# Patient Record
Sex: Female | Born: 1945
Health system: Southern US, Community
[De-identification: ages and names within clinical notes are randomized; demographics above are authoritative.]

## PROBLEM LIST (undated history)

## (undated) DIAGNOSIS — G629 Polyneuropathy, unspecified: Secondary | ICD-10-CM

## (undated) DIAGNOSIS — M2540 Effusion, unspecified joint: Secondary | ICD-10-CM

## (undated) DIAGNOSIS — M1711 Unilateral primary osteoarthritis, right knee: Secondary | ICD-10-CM

## (undated) DIAGNOSIS — Z860100 Personal history of colon polyps, unspecified: Secondary | ICD-10-CM

## (undated) DIAGNOSIS — Z803 Family history of malignant neoplasm of breast: Secondary | ICD-10-CM

## (undated) DIAGNOSIS — Z8619 Personal history of other infectious and parasitic diseases: Secondary | ICD-10-CM

## (undated) DIAGNOSIS — E785 Hyperlipidemia, unspecified: Secondary | ICD-10-CM

## (undated) DIAGNOSIS — F419 Anxiety disorder, unspecified: Secondary | ICD-10-CM

## (undated) DIAGNOSIS — K219 Gastro-esophageal reflux disease without esophagitis: Secondary | ICD-10-CM

## (undated) DIAGNOSIS — I10 Essential (primary) hypertension: Secondary | ICD-10-CM

## (undated) DIAGNOSIS — M2550 Pain in unspecified joint: Secondary | ICD-10-CM

## (undated) DIAGNOSIS — T7840XA Allergy, unspecified, initial encounter: Secondary | ICD-10-CM

## (undated) DIAGNOSIS — G4730 Sleep apnea, unspecified: Secondary | ICD-10-CM

## (undated) DIAGNOSIS — J189 Pneumonia, unspecified organism: Secondary | ICD-10-CM

## (undated) DIAGNOSIS — Z800 Family history of malignant neoplasm of digestive organs: Secondary | ICD-10-CM

## (undated) DIAGNOSIS — R7303 Prediabetes: Secondary | ICD-10-CM

## (undated) DIAGNOSIS — F329 Major depressive disorder, single episode, unspecified: Secondary | ICD-10-CM

## (undated) DIAGNOSIS — Z87442 Personal history of urinary calculi: Secondary | ICD-10-CM

## (undated) DIAGNOSIS — F32A Depression, unspecified: Secondary | ICD-10-CM

## (undated) HISTORY — PX: APPENDECTOMY: SHX54

## (undated) HISTORY — DX: Gastro-esophageal reflux disease without esophagitis: K21.9

## (undated) HISTORY — PX: ELBOW ARTHROSCOPY: SUR87

## (undated) HISTORY — PX: OTHER SURGICAL HISTORY: SHX169

## (undated) HISTORY — DX: Family history of malignant neoplasm of breast: Z80.3

## (undated) HISTORY — PX: COLONOSCOPY: SHX174

## (undated) HISTORY — DX: Family history of malignant neoplasm of digestive organs: Z80.0

## (undated) HISTORY — DX: Depression, unspecified: F32.A

## (undated) HISTORY — DX: Allergy, unspecified, initial encounter: T78.40XA

## (undated) HISTORY — DX: Hyperlipidemia, unspecified: E78.5

## (undated) HISTORY — DX: Anxiety disorder, unspecified: F41.9

## (undated) HISTORY — PX: EYE SURGERY: SHX253

## (undated) HISTORY — PX: JOINT REPLACEMENT: SHX530

## (undated) HISTORY — DX: Essential (primary) hypertension: I10

## (undated) HISTORY — DX: Major depressive disorder, single episode, unspecified: F32.9

---

## 1974-04-19 HISTORY — PX: DIAGNOSTIC LAPAROSCOPY: SUR761

## 1974-04-19 HISTORY — PX: PAROTID GLAND TUMOR EXCISION: SHX5221

## 1987-04-20 HISTORY — PX: HYSTEROTOMY: SHX1776

## 1987-04-20 HISTORY — PX: ABDOMINAL HYSTERECTOMY: SHX81

## 2002-04-19 HISTORY — PX: CERVICAL SPINE SURGERY: SHX589

## 2002-12-27 ENCOUNTER — Encounter: Payer: Self-pay | Admitting: Neurosurgery

## 2002-12-27 ENCOUNTER — Inpatient Hospital Stay (HOSPITAL_COMMUNITY): Admission: RE | Admit: 2002-12-27 | Discharge: 2002-12-28 | Payer: Self-pay | Admitting: Neurosurgery

## 2004-02-06 ENCOUNTER — Encounter: Admission: RE | Admit: 2004-02-06 | Discharge: 2004-02-06 | Payer: Self-pay | Admitting: Internal Medicine

## 2004-04-19 HISTORY — PX: SHOULDER ARTHROSCOPY W/ ROTATOR CUFF REPAIR: SHX2400

## 2004-04-19 HISTORY — PX: SHOULDER ARTHROSCOPY DISTAL CLAVICLE EXCISION AND OPEN ROTATOR CUFF REPAIR: SHX2396

## 2005-02-24 ENCOUNTER — Encounter: Admission: RE | Admit: 2005-02-24 | Discharge: 2005-02-24 | Payer: Self-pay | Admitting: Internal Medicine

## 2006-03-31 ENCOUNTER — Encounter: Admission: RE | Admit: 2006-03-31 | Discharge: 2006-03-31 | Payer: Self-pay | Admitting: Internal Medicine

## 2006-04-13 ENCOUNTER — Encounter: Admission: RE | Admit: 2006-04-13 | Discharge: 2006-04-13 | Payer: Self-pay | Admitting: Internal Medicine

## 2006-04-18 ENCOUNTER — Encounter (INDEPENDENT_AMBULATORY_CARE_PROVIDER_SITE_OTHER): Payer: Self-pay | Admitting: Specialist

## 2006-04-18 ENCOUNTER — Encounter: Admission: RE | Admit: 2006-04-18 | Discharge: 2006-04-18 | Payer: Self-pay | Admitting: Internal Medicine

## 2006-04-19 HISTORY — PX: BREAST EXCISIONAL BIOPSY: SUR124

## 2006-06-22 ENCOUNTER — Ambulatory Visit: Payer: Self-pay | Admitting: Internal Medicine

## 2006-07-11 ENCOUNTER — Ambulatory Visit: Payer: Self-pay | Admitting: Internal Medicine

## 2006-07-11 LAB — CONVERTED CEMR LAB
ALT: 17 units/L (ref 0–40)
AST: 19 units/L (ref 0–37)
Albumin: 3.9 g/dL (ref 3.5–5.2)
Alkaline Phosphatase: 72 units/L (ref 39–117)
BUN: 17 mg/dL (ref 6–23)
Basophils Absolute: 0 10*3/uL (ref 0.0–0.1)
Basophils Relative: 1.3 % — ABNORMAL HIGH (ref 0.0–1.0)
Bilirubin, Direct: 0.1 mg/dL (ref 0.0–0.3)
CO2: 33 meq/L — ABNORMAL HIGH (ref 19–32)
Calcium: 9.4 mg/dL (ref 8.4–10.5)
Chloride: 106 meq/L (ref 96–112)
Creatinine, Ser: 0.7 mg/dL (ref 0.4–1.2)
Eosinophils Absolute: 0.1 10*3/uL (ref 0.0–0.6)
Eosinophils Relative: 2.9 % (ref 0.0–5.0)
GFR calc Af Amer: 111 mL/min
GFR calc non Af Amer: 91 mL/min
Glucose, Bld: 104 mg/dL — ABNORMAL HIGH (ref 70–99)
HCT: 39.7 % (ref 36.0–46.0)
Hemoglobin: 13.7 g/dL (ref 12.0–15.0)
Hgb A1c MFr Bld: 5.9 % (ref 4.6–6.0)
Lymphocytes Relative: 27.1 % (ref 12.0–46.0)
MCHC: 34.6 g/dL (ref 30.0–36.0)
MCV: 77.4 fL — ABNORMAL LOW (ref 78.0–100.0)
Monocytes Absolute: 0.2 10*3/uL (ref 0.2–0.7)
Monocytes Relative: 6.1 % (ref 3.0–11.0)
Neutro Abs: 2.3 10*3/uL (ref 1.4–7.7)
Neutrophils Relative %: 62.6 % (ref 43.0–77.0)
Platelets: 204 10*3/uL (ref 150–400)
Potassium: 4.2 meq/L (ref 3.5–5.1)
RBC: 5.12 M/uL — ABNORMAL HIGH (ref 3.87–5.11)
RDW: 12.3 % (ref 11.5–14.6)
Sodium: 145 meq/L (ref 135–145)
TSH: 2.19 microintl units/mL (ref 0.35–5.50)
Total Bilirubin: 0.5 mg/dL (ref 0.3–1.2)
Total Protein: 6.1 g/dL (ref 6.0–8.3)
WBC: 3.5 10*3/uL — ABNORMAL LOW (ref 4.5–10.5)

## 2006-07-25 ENCOUNTER — Ambulatory Visit: Payer: Self-pay | Admitting: Internal Medicine

## 2006-10-10 ENCOUNTER — Ambulatory Visit: Payer: Self-pay | Admitting: Internal Medicine

## 2006-10-11 ENCOUNTER — Encounter (INDEPENDENT_AMBULATORY_CARE_PROVIDER_SITE_OTHER): Payer: Self-pay | Admitting: *Deleted

## 2006-10-11 LAB — CONVERTED CEMR LAB
ALT: 19 units/L (ref 0–40)
AST: 18 units/L (ref 0–37)
Cholesterol: 201 mg/dL (ref 0–200)
Direct LDL: 115.2 mg/dL
HDL: 59.8 mg/dL (ref >39.0)
Total CHOL/HDL Ratio: 3.4
Triglycerides: 88 mg/dL (ref 0–149)
VLDL: 18 mg/dL (ref 0–40)

## 2006-12-08 ENCOUNTER — Ambulatory Visit: Payer: Self-pay | Admitting: Internal Medicine

## 2007-10-23 ENCOUNTER — Encounter: Admission: RE | Admit: 2007-10-23 | Discharge: 2007-10-23 | Payer: Self-pay | Admitting: Emergency Medicine

## 2007-10-24 ENCOUNTER — Ambulatory Visit: Payer: Self-pay

## 2008-04-19 HISTORY — PX: OTHER SURGICAL HISTORY: SHX169

## 2009-03-10 ENCOUNTER — Ambulatory Visit (HOSPITAL_BASED_OUTPATIENT_CLINIC_OR_DEPARTMENT_OTHER): Admission: RE | Admit: 2009-03-10 | Discharge: 2009-03-10 | Payer: Self-pay | Admitting: Orthopedic Surgery

## 2010-05-10 ENCOUNTER — Encounter: Payer: Self-pay | Admitting: Internal Medicine

## 2010-07-22 LAB — POCT HEMOGLOBIN-HEMACUE: Hemoglobin: 13 g/dL (ref 12.0–15.0)

## 2010-09-04 NOTE — Op Note (Signed)
NAME:  Monica Kane, Monica Kane                         ACCOUNT NO.:  [id number]   MEDICAL RECORD NO.:  [id number]                   PATIENT TYPE:  INP   LOCATION:  2899                                 FACILITY:  [facility]   PHYSICIAN:  Hewitt Shorts, M.D.            DATE OF BIRTH:  29-Oct-1945   DATE OF PROCEDURE:  12/27/2002  DATE OF DISCHARGE:                                 OPERATIVE REPORT   PREOPERATIVE DIAGNOSIS:  C4-5 and C5-7 cervical disk  herniation,  spondylosis and degenerative disk disease.   POSTOPERATIVE DIAGNOSIS:  C4-5 and C5-7 cervical disk  herniation,  spondylosis and degenerative disk disease.   PROCEDURE:  C4-5 and C5-6 anterior cervical diskectomy and arthrodesis with  iliac crest allograft and Trinica cervical plating.   SURGEON:  Hewitt Shorts, M.D.   ASSISTANT:  Cristi Loron, M.D.   ANESTHESIA:  General endotracheal anesthesia.   INDICATIONS FOR PROCEDURE:  This is a 65 year old woman who presented with  an acute left cervical radiculopathy and was found to have a cervical disk  herniation, spondylosis and degenerative disk disease, C4-5 and C5-6. The  decision was made to proceed with a 2 level anterior cervical diskectomy and  arthrodesis.   DESCRIPTION OF PROCEDURE:  The patient was brought to the operating room and  placed under general endotracheal anesthesia. The patient was placed in 10  pounds of halter traction and the neck  was prepared with Betadine soap and  solution and was draped in a sterile fashion.   A horizontal incision was made on the left side of the neck. The  longitudinal incision was infiltrated with local anesthesia with  epinephrine. An incision was made and was carried down through the  subcutaneous tissue. Bipolar cautery and electrocautery were used to  maintain hemostasis. Dissection was carried down through the platysma and  then through an avascular plane, moving the sternocleidomastoid, carotid  artery and  jugular vein laterally and the trachea and esophagus medially.   The ventral aspect of the vertebral column was identified and localizing x-  rays were taken and the C4-5 and C5-6 disk spaces were identified.  Diskectomy was begun at each level  with incision of the annulus and  continued with microcurets and pituitary rongeurs. The cauterized endplates  were removed using microcurets and the Micromax drill. Then the microscope  was draped and brought into the field to provide additional magnification,  illumination and visualization, and the remainder of the diskectomy and  decompression was performed using microdissection and microsurgical  technique.   The posterior osteophytic overgrowth was removed using the Micromax drill  and the 2-mm Kerrison punches with a thin footplate. The posterior  longitudinal ligament  which was thickened at each level was carefully  removed, and we were able to remove the posterior osteophytes and thickened  ligament and decompress the spinal canal and  thecal sac.   We then examined the  foramina bilaterally at each level  and these were  decompressed. We did encounter soft disk herniation on the left side, which  I suspect corresponded to her acute radiculopathy. This was removed and good  decompression of the nerve roots and foramina was achieved bilaterally. The  wound was irrigated with Bacitracin solution. Hemostasis was established  with the use of Gelfoam soaked in thrombin.   Once decompression was completed and hemostasis was established, we  proceeded with an arthrodesis. We selected wedges of iliac crest allograft.  Each intravertebral disk space was sized  to a 7-mm height, and therefore we  selected 7-mm grafts that  were hydrated in saline solution and positioned  in the intravertebral disk space and countersunk. We then selected a 44-mm  Trinica cervical plate which was positioned  over  the fusion construct and  secured to each of  the  vertebra with a pair of  4.2 x 14 mm screws. Each  screw hole was drilled  and tapped and the screws placed in an alternating  fashion. All 6 screws were  placed fully tightened and then the locking  system secured. The wound was irrigated with Bacitracin solution.   Once hemostasis  was established and confirmed, we obtained an x-ray which  showed the plate, screws and grafts in good position. The overall alignment  was good. Then we proceeded with closure once hemostasis was confirmed.   The platysma was closed with interrupted, inverted 2-0 Vicryl running  sutures. The subcutaneous and subcuticular layers were closed with  interrupted inverted 3-0 running Vicryl sutures and the skin edges were  reapproximated with Dermabond.   The patient tolerated the procedure well. The estimated blood loss was less  than 25 mL. Sponge and instrument counts were correct. Following  the  surgery the patient was to be reversed from anesthetic, extubated and  transferred to the recovery room for further care. She was placed in a soft  cervical collar.                                               Hewitt Shorts, M.D.    RWN/MEDQ  D:  12/27/2002  T:  12/28/2002  Job:  [id number]

## 2010-12-22 ENCOUNTER — Ambulatory Visit: Payer: Self-pay | Admitting: Sports Medicine

## 2011-01-29 ENCOUNTER — Other Ambulatory Visit: Payer: Self-pay | Admitting: Emergency Medicine

## 2011-01-29 DIAGNOSIS — Z1231 Encounter for screening mammogram for malignant neoplasm of breast: Secondary | ICD-10-CM

## 2011-02-17 ENCOUNTER — Ambulatory Visit
Admission: RE | Admit: 2011-02-17 | Discharge: 2011-02-17 | Disposition: A | Discharge status: Home / Self Care | Payer: 59 | Source: Ambulatory Visit | Attending: Emergency Medicine | Admitting: Emergency Medicine

## 2011-02-17 DIAGNOSIS — Z1231 Encounter for screening mammogram for malignant neoplasm of breast: Secondary | ICD-10-CM

## 2011-02-24 ENCOUNTER — Other Ambulatory Visit: Payer: Self-pay | Admitting: Physician Assistant

## 2011-02-24 ENCOUNTER — Encounter: Payer: Self-pay | Admitting: Physician Assistant

## 2011-02-24 DIAGNOSIS — F329 Major depressive disorder, single episode, unspecified: Secondary | ICD-10-CM | POA: Insufficient documentation

## 2011-02-24 DIAGNOSIS — M7712 Lateral epicondylitis, left elbow: Secondary | ICD-10-CM

## 2011-02-24 DIAGNOSIS — Z9841 Cataract extraction status, right eye: Secondary | ICD-10-CM | POA: Insufficient documentation

## 2011-02-24 DIAGNOSIS — G43909 Migraine, unspecified, not intractable, without status migrainosus: Secondary | ICD-10-CM | POA: Insufficient documentation

## 2011-02-24 DIAGNOSIS — F32A Depression, unspecified: Secondary | ICD-10-CM | POA: Insufficient documentation

## 2011-02-24 DIAGNOSIS — M1990 Unspecified osteoarthritis, unspecified site: Secondary | ICD-10-CM

## 2011-02-24 NOTE — H&P (Signed)
Monica Kane is an 65 y.o. female.   Chief Complaint: left elbow pain HPI: Monica Kane is seen for follow up evaluation for continued left elbow pain with pain both laterally, medially and posteriorly.  She underwent an MRI of the left elbow previously that showed no tear, either medial or lateral with mild epicondylitis.  She also underwent EMG/PNCVs of her left upper arm that showed no specific nerve abnormalities.  She had undergone a right elbow lateral epicondylar release and did well in 2010.  She has been in physical therapy for the left elbow with minimal relief.  She has also tried both Naproxen and Voltaren with minimal relief.  Past Medical History  Diagnosis Date  . Hypertension   . Arthritis   . Depression   . Migraine     Past Surgical History  Procedure Date  . Abdominal hysterectomy 1989  . Lateral release right elbow 2010  . Shoulder arthroscopy w/ rotator cuff repair 2006  . Cervical spine surgery 2004    C 5-6  . Parotid gland tumor excision 1976    benign    History reviewed. No pertinent family history. Social History:  reports that she has never smoked. She does not have any smokeless tobacco history on file. She reports that she drinks alcohol. Her drug history not on file.  Allergies:  Allergies  Allergen Reactions  . Penicillins Swelling and Rash      PENICILLINS   Height: 5' 5" (1.651 m)   Weight:              Smoking: Never Smoker   Smokeless Tobacco: Unknown     No current outpatient prescriptions on file as of 02/24/2011.   No current facility-administered medications on file as of 02/24/2011.    carvedilol (COREG) 3.125 MG tablet   rosuvastatin (CRESTOR) 10 MG tablet   sertraline (ZOLOFT) 100 MG tablet   thiamine (VITAMIN B-1) 100 MG tablet   traZODone (DESYREL) 50 MG tablet        No results found for this or any previous visit (from the past 48 hour(s)). No results found.  Review of Systems  Constitutional: Negative.     HENT: Negative for hearing loss, ear pain, nosebleeds, congestion, sore throat, neck pain, tinnitus and ear discharge.   Eyes: Positive for blurred vision. Negative for double vision, photophobia, pain, discharge and redness.       Cataracts  Respiratory: Negative.  Negative for stridor.   Cardiovascular: Negative.   Gastrointestinal: Negative.   Genitourinary: Negative.   Musculoskeletal: Positive for joint pain. Negative for myalgias, back pain and falls.       Left elbow  Skin: Negative.   Neurological: Positive for headaches.  Endo/Heme/Allergies: Negative.   Psychiatric/Behavioral: Positive for depression. Negative for suicidal ideas, hallucinations, memory loss and substance abuse. The patient has insomnia. The patient is not nervous/anxious.     Height 5' 5" (1.651 m), weight 70.308 kg (155 lb). Physical Exam  Constitutional: She is oriented to person, place, and time. She appears well-developed and well-nourished.  HENT:  Head: Normocephalic and atraumatic.  Eyes: Conjunctivae and EOM are normal. Pupils are equal, round, and reactive to light.  Neck: Neck supple.  Cardiovascular: Normal rate and regular rhythm.   Respiratory: Effort normal and breath sounds normal.  GI: Soft. Bowel sounds are normal.  Genitourinary:       Not pertinent to current symptomatology therefore not examined.  Musculoskeletal:       Examination of her   left elbow reveals pain laterally, posteriorly and medially.  She has minimal swelling.  Full range of motion with diffuse pain.  No instability.  Examination of her right elbow reveals full range of motion without pain, swelling, weakness or instability.  Vascular exam: Pulses are 2+ and symmetric.   Neurological: She is alert and oriented to person, place, and time.  Skin: Skin is warm and dry.  Psychiatric: She has a normal mood and affect. Her behavior is normal. Judgment and thought content normal.     Assessment: Patient Active Problem List   Diagnoses  . Lateral epicondylitis of left elbow  . Migraine  . Depression  . Cataract  . Arthritis    Plan: The risks, benefits, and possible complications of the lateral elbow release procedure were discussed in detail with the patient.  The patient is without question.  We will proceed with surgery as an outpatient.  Kyrene Longan A. Chynna Buerkle, PA-C Physician Assistant [facility] [phone]  02/24/2011, 7:16 PM Jonne Rote J 02/24/2011, 6:37 PM    

## 2011-02-26 ENCOUNTER — Encounter (HOSPITAL_BASED_OUTPATIENT_CLINIC_OR_DEPARTMENT_OTHER): Payer: Self-pay | Admitting: *Deleted

## 2011-02-26 NOTE — Progress Notes (Signed)
Dr Wyline Mood orderd labs,ekg,cxr-pt cannot come in for tests today-will have to do am surg-coming in 2 hr pre-op Bring all meds

## 2011-03-01 ENCOUNTER — Other Ambulatory Visit: Payer: Self-pay

## 2011-03-01 ENCOUNTER — Encounter (HOSPITAL_BASED_OUTPATIENT_CLINIC_OR_DEPARTMENT_OTHER): Payer: Self-pay | Admitting: Anesthesiology

## 2011-03-01 ENCOUNTER — Ambulatory Visit (HOSPITAL_BASED_OUTPATIENT_CLINIC_OR_DEPARTMENT_OTHER): Payer: 59 | Admitting: Anesthesiology

## 2011-03-01 ENCOUNTER — Encounter (HOSPITAL_BASED_OUTPATIENT_CLINIC_OR_DEPARTMENT_OTHER)
Admission: RE | Disposition: A | Discharge status: Home / Self Care | Payer: Self-pay | Source: Ambulatory Visit | Attending: Orthopedic Surgery

## 2011-03-01 ENCOUNTER — Encounter (HOSPITAL_BASED_OUTPATIENT_CLINIC_OR_DEPARTMENT_OTHER): Payer: Self-pay | Admitting: *Deleted

## 2011-03-01 ENCOUNTER — Ambulatory Visit (HOSPITAL_BASED_OUTPATIENT_CLINIC_OR_DEPARTMENT_OTHER)
Admission: RE | Admit: 2011-03-01 | Discharge: 2011-03-01 | Disposition: A | Discharge status: Home / Self Care | Payer: 59 | Source: Ambulatory Visit | Attending: Orthopedic Surgery | Admitting: Orthopedic Surgery

## 2011-03-01 DIAGNOSIS — M7710 Lateral epicondylitis, unspecified elbow: Secondary | ICD-10-CM | POA: Insufficient documentation

## 2011-03-01 DIAGNOSIS — M7712 Lateral epicondylitis, left elbow: Secondary | ICD-10-CM

## 2011-03-01 DIAGNOSIS — I10 Essential (primary) hypertension: Secondary | ICD-10-CM | POA: Insufficient documentation

## 2011-03-01 HISTORY — PX: LATERAL EPICONDYLE RELEASE: SHX1958

## 2011-03-01 LAB — POCT I-STAT, CHEM 8
BUN: 19 mg/dL (ref 6–23)
Chloride: 106 mEq/L (ref 96–112)
Creatinine, Ser: 0.7 mg/dL (ref 0.50–1.10)
Glucose, Bld: 106 mg/dL — ABNORMAL HIGH (ref 70–99)
HCT: 40 % (ref 36.0–46.0)
Potassium: 3.9 mEq/L (ref 3.5–5.1)

## 2011-03-01 SURGERY — TENNIS ELBOW RELEASE/NIRSCHEL PROCEDURE
Anesthesia: General | Site: Elbow | Laterality: Left

## 2011-03-01 MED ORDER — DEXAMETHASONE SODIUM PHOSPHATE 4 MG/ML IJ SOLN
INTRAMUSCULAR | Status: DC | PRN
  Administered 2011-03-01: 10 mg via INTRAVENOUS

## 2011-03-01 MED ORDER — PROMETHAZINE HCL 25 MG/ML IJ SOLN: 6.2500 mg | INTRAMUSCULAR | Status: DC | PRN

## 2011-03-01 MED ORDER — FENTANYL CITRATE 0.05 MG/ML IJ SOLN
INTRAMUSCULAR | Status: DC | PRN
  Administered 2011-03-01: 50 ug via INTRAVENOUS
  Administered 2011-03-01 (×2): 25 ug via INTRAVENOUS

## 2011-03-01 MED ORDER — PROPOFOL 10 MG/ML IV EMUL
INTRAVENOUS | Status: DC | PRN
  Administered 2011-03-01: 200 mg via INTRAVENOUS

## 2011-03-01 MED ORDER — OXYCODONE-ACETAMINOPHEN 5-325 MG PO TABS: 1.0000 | ORAL_TABLET | Freq: Four times a day (QID) | ORAL | Status: AC | PRN

## 2011-03-01 MED ORDER — OXYCODONE-ACETAMINOPHEN 5-325 MG PO TABS
1.0000 | ORAL_TABLET | Freq: Once | ORAL | Status: AC
  Administered 2011-03-01: 1 via ORAL

## 2011-03-01 MED ORDER — METHOCARBAMOL 500 MG PO TABS: 500.0000 mg | ORAL_TABLET | Freq: Three times a day (TID) | ORAL | Status: AC | PRN

## 2011-03-01 MED ORDER — LIDOCAINE HCL (CARDIAC) 20 MG/ML IV SOLN
INTRAVENOUS | Status: DC | PRN
  Administered 2011-03-01: 60 mg via INTRAVENOUS

## 2011-03-01 MED ORDER — MORPHINE SULFATE 2 MG/ML IJ SOLN: 0.0500 mg/kg | INTRAMUSCULAR | Status: DC | PRN

## 2011-03-01 MED ORDER — VANCOMYCIN HCL IN DEXTROSE 1-5 GM/200ML-% IV SOLN
1000.0000 mg | INTRAVENOUS | Status: AC
  Administered 2011-03-01: 1000 mg via INTRAVENOUS

## 2011-03-01 MED ORDER — MEPERIDINE HCL 25 MG/ML IJ SOLN: 6.2500 mg | INTRAMUSCULAR | Status: DC | PRN

## 2011-03-01 MED ORDER — HYDROMORPHONE HCL PF 1 MG/ML IJ SOLN: 0.2500 mg | INTRAMUSCULAR | Status: DC | PRN

## 2011-03-01 MED ORDER — ONDANSETRON HCL 4 MG/2ML IJ SOLN
INTRAMUSCULAR | Status: DC | PRN
  Administered 2011-03-01: 4 mg via INTRAVENOUS

## 2011-03-01 MED ORDER — BUPIVACAINE HCL (PF) 0.5 % IJ SOLN
INTRAMUSCULAR | Status: DC | PRN
  Administered 2011-03-01: 10 mL

## 2011-03-01 MED ORDER — LACTATED RINGERS IV SOLN
INTRAVENOUS | Status: DC
  Administered 2011-03-01: 08:00:00 via INTRAVENOUS

## 2011-03-01 MED ORDER — MIDAZOLAM HCL 5 MG/5ML IJ SOLN
INTRAMUSCULAR | Status: DC | PRN
  Administered 2011-03-01: 2 mg via INTRAVENOUS

## 2011-03-01 MED ORDER — HYDROMORPHONE HCL PF 1 MG/ML IJ SOLN
0.2500 mg | INTRAMUSCULAR | Status: DC | PRN
  Administered 2011-03-01 (×4): 0.5 mg via INTRAVENOUS

## 2011-03-01 SURGICAL SUPPLY — 80 items
APL SKNCLS STERI-STRIP NONHPOA (GAUZE/BANDAGES/DRESSINGS) ×1
BANDAGE ELASTIC 4 VELCRO ST LF (GAUZE/BANDAGES/DRESSINGS) ×3 IMPLANT
BANDAGE ELASTIC 6 VELCRO ST LF (GAUZE/BANDAGES/DRESSINGS) ×2 IMPLANT
BANDAGE GAUZE ELAST BULKY 4 IN (GAUZE/BANDAGES/DRESSINGS) ×2 IMPLANT
BENZOIN TINCTURE PRP APPL 2/3 (GAUZE/BANDAGES/DRESSINGS) ×1 IMPLANT
BLADE SURG 15 STRL LF DISP TIS (BLADE) ×1 IMPLANT
BLADE SURG 15 STRL SS (BLADE) ×4
BNDG CMPR 9X4 STRL LF SNTH (GAUZE/BANDAGES/DRESSINGS) ×1
BNDG COHESIVE 4X5 TAN STRL (GAUZE/BANDAGES/DRESSINGS) ×2 IMPLANT
BNDG ESMARK 4X9 LF (GAUZE/BANDAGES/DRESSINGS) ×2 IMPLANT
COVER TABLE BACK 60X90 (DRAPES) ×2 IMPLANT
CUFF TOURNIQUET SINGLE 18IN (TOURNIQUET CUFF) ×1 IMPLANT
DRAPE EXTREMITY T 121X128X90 (DRAPE) ×2 IMPLANT
DRAPE SURG 17X23 STRL (DRAPES) ×1 IMPLANT
DRAPE U 20/CS (DRAPES) ×2 IMPLANT
DRAPE U-SHAPE 47X51 STRL (DRAPES) ×2 IMPLANT
DRSG EMULSION OIL 3X3 NADH (GAUZE/BANDAGES/DRESSINGS) ×2 IMPLANT
DRSG PAD ABDOMINAL 8X10 ST (GAUZE/BANDAGES/DRESSINGS) ×1 IMPLANT
DURAPREP 26ML APPLICATOR (WOUND CARE) ×2 IMPLANT
ELECT REM PT RETURN 9FT ADLT (ELECTROSURGICAL) ×2
ELECTRODE REM PT RTRN 9FT ADLT (ELECTROSURGICAL) ×1 IMPLANT
GAUZE XEROFORM 1X8 LF (GAUZE/BANDAGES/DRESSINGS) ×2 IMPLANT
GLOVE BIO SURGEON STRL SZ 6.5 (GLOVE) ×1 IMPLANT
GLOVE BIO SURGEON STRL SZ7 (GLOVE) ×2 IMPLANT
GLOVE BIOGEL PI IND STRL 7.0 (GLOVE) ×1 IMPLANT
GLOVE BIOGEL PI IND STRL 7.5 (GLOVE) ×1 IMPLANT
GLOVE BIOGEL PI INDICATOR 7.0 (GLOVE) ×2
GLOVE BIOGEL PI INDICATOR 7.5 (GLOVE) ×1
GLOVE SS BIOGEL STRL SZ 7.5 (GLOVE) ×1 IMPLANT
GLOVE SUPERSENSE BIOGEL SZ 7.5 (GLOVE) ×1
GOWN PREVENTION PLUS XLARGE (GOWN DISPOSABLE) ×4 IMPLANT
LOOP VESSEL MAXI BLUE (MISCELLANEOUS) IMPLANT
NDL ADDISON D1/2 CIR (NEEDLE) IMPLANT
NDL HYPO 25X1 1.5 SAFETY (NEEDLE) ×1 IMPLANT
NDL SAFETY ECLIPSE 18X1.5 (NEEDLE) IMPLANT
NDL SUT 6 .5 CRC .975X.05 MAYO (NEEDLE) IMPLANT
NEEDLE ADDISON D1/2 CIR (NEEDLE) IMPLANT
NEEDLE FISTULA 1/2 CIRCLE (NEEDLE) ×2 IMPLANT
NEEDLE HYPO 18GX1.5 SHARP (NEEDLE)
NEEDLE HYPO 22GX1.5 SAFETY (NEEDLE) ×1 IMPLANT
NEEDLE HYPO 25X1 1.5 SAFETY (NEEDLE) IMPLANT
NEEDLE MAYO TAPER (NEEDLE)
NS IRRIG 1000ML POUR BTL (IV SOLUTION) ×2 IMPLANT
PACK BASIN DAY SURGERY FS (CUSTOM PROCEDURE TRAY) ×2 IMPLANT
PAD CAST 3X4 CTTN HI CHSV (CAST SUPPLIES) ×1 IMPLANT
PAD CAST 4YDX4 CTTN HI CHSV (CAST SUPPLIES) ×1 IMPLANT
PADDING CAST ABS 4INX4YD NS (CAST SUPPLIES) ×1
PADDING CAST ABS COTTON 4X4 ST (CAST SUPPLIES) ×1 IMPLANT
PADDING CAST COTTON 3X4 STRL (CAST SUPPLIES)
PADDING CAST COTTON 4X4 STRL (CAST SUPPLIES) ×2
PASSER SUT SWANSON 36MM LOOP (INSTRUMENTS) IMPLANT
PENCIL BUTTON HOLSTER BLD 10FT (ELECTRODE) ×2 IMPLANT
SLEEVE SCD COMPRESS KNEE MED (MISCELLANEOUS) ×1 IMPLANT
SLING ARM FOAM STRAP LRG (SOFTGOODS) ×1 IMPLANT
SPLINT PLASTER CAST XFAST 5X30 (CAST SUPPLIES) IMPLANT
SPLINT PLASTER XFAST SET 5X30 (CAST SUPPLIES) ×1
SPONGE GAUZE 4X4 12PLY (GAUZE/BANDAGES/DRESSINGS) ×2 IMPLANT
STOCKINETTE 4X48 STRL (DRAPES) IMPLANT
STOCKINETTE IMPERVIOUS LG (DRAPES) ×2 IMPLANT
STRIP CLOSURE SKIN 1/2X4 (GAUZE/BANDAGES/DRESSINGS) ×1 IMPLANT
SUCTION FRAZIER TIP 10 FR DISP (SUCTIONS) IMPLANT
SUT ETHILON 4 0 PS 2 18 (SUTURE) ×1 IMPLANT
SUT ETHILON 5 0 P 3 18 (SUTURE)
SUT FIBERWIRE 2-0 18 17.9 3/8 (SUTURE) ×2
SUT MNCRL AB 3-0 PS2 18 (SUTURE) IMPLANT
SUT NYLON ETHILON 5-0 P-3 1X18 (SUTURE) IMPLANT
SUT PROLENE 3 0 PS 2 (SUTURE) ×1 IMPLANT
SUT VIC AB 0 CT1 27 (SUTURE)
SUT VIC AB 0 CT1 27XBRD ANBCTR (SUTURE) IMPLANT
SUT VIC AB 2-0 SH 27 (SUTURE) ×2
SUT VIC AB 2-0 SH 27XBRD (SUTURE) ×1 IMPLANT
SUT VIC AB 3-0 SH 27 (SUTURE) ×2
SUT VIC AB 3-0 SH 27X BRD (SUTURE) IMPLANT
SUTURE FIBERWR 2-0 18 17.9 3/8 (SUTURE) IMPLANT
SYR BULB 3OZ (MISCELLANEOUS) ×2 IMPLANT
SYR CONTROL 10ML LL (SYRINGE) ×2 IMPLANT
TOWEL OR 17X24 6PK STRL BLUE (TOWEL DISPOSABLE) ×4 IMPLANT
TUBE CONNECTING 20X1/4 (TUBING) IMPLANT
UNDERPAD 30X30 INCONTINENT (UNDERPADS AND DIAPERS) ×2 IMPLANT
WATER STERILE IRR 1000ML POUR (IV SOLUTION) ×2 IMPLANT

## 2011-03-01 NOTE — Anesthesia Postprocedure Evaluation (Signed)
  Anesthesia Post-op Note  Patient: Monica Kane  Procedure(s) Performed:  TENNIS ELBOW RELEASE - TENOTOMY ELBOW LATERAL EPICONDYLITIS TENNIS ELBOW  Patient Location: PACU  Anesthesia Type: General  Level of Consciousness: awake and alert   Airway and Oxygen Therapy: Patient Spontanous Breathing  Post-op Pain: mild  Post-op Assessment: Post-op Vital signs reviewed, Patient's Cardiovascular Status Stable, Respiratory Function Stable and Patent Airway  Post-op Vital Signs: stable  Complications: No apparent anesthesia complications

## 2011-03-01 NOTE — Interval H&P Note (Signed)
History and Physical Interval Note:   03/01/2011   9:26 AM   Monica Kane  has presented today for surgery, with the diagnosis of LEFT ELBOW EPICONDYLITIS-LATERAL  The various methods of treatment have been discussed with the patient and family. After consideration of risks, benefits and other options for treatment, the patient has consented to  Procedure(s): TENNIS ELBOW RELEASE as a surgical intervention .  The patients' history has been reviewed, patient examined, no change in status, stable for surgery.  I have reviewed the patients' chart and labs.  Questions were answered to the patient's satisfaction.     Nilda Simmer  MD

## 2011-03-01 NOTE — Anesthesia Preprocedure Evaluation (Signed)
Anesthesia Evaluation  Patient identified by MRN, date of birth, ID band Patient awake    Reviewed: Allergy & Precautions, H&P , NPO status , Patient's Chart, lab work & pertinent test results  Airway Mallampati: I TM Distance: >3 FB Neck ROM: full    Dental No notable dental hx. (+) Teeth Intact   Pulmonary neg pulmonary ROS,  clear to auscultation  Pulmonary exam normal       Cardiovascular hypertension, On Medications and On Home Beta Blockers regular Normal    Neuro/Psych Negative Neurological ROS  Negative Psych ROS   GI/Hepatic negative GI ROS, Neg liver ROS,   Endo/Other  Negative Endocrine ROS  Renal/GU negative Renal ROS  Genitourinary negative   Musculoskeletal   Abdominal   Peds  Hematology negative hematology ROS (+)   Anesthesia Other Findings   Reproductive/Obstetrics negative OB ROS                           Anesthesia Physical Anesthesia Plan  ASA: II  Anesthesia Plan: General   Post-op Pain Management:    Induction:   Airway Management Planned:   Additional Equipment:   Intra-op Plan:   Post-operative Plan:   Informed Consent: I have reviewed the patients History and Physical, chart, labs and discussed the procedure including the risks, benefits and alternatives for the proposed anesthesia with the patient or authorized representative who has indicated his/her understanding and acceptance.     Plan Discussed with: CRNA  Anesthesia Plan Comments:         Anesthesia Quick Evaluation

## 2011-03-01 NOTE — Brief Op Note (Signed)
03/01/2011  10:13 AM  PATIENT:  [name]  65 y.o. female  PRE-OPERATIVE DIAGNOSIS:  LEFT ELBOW EPICONDYLITIS-LATERAL  POST-OPERATIVE DIAGNOSIS:  same  PROCEDURE:  Procedure(s): TENNIS ELBOW RELEASE LEFT  SURGEON:  Surgeon(s): Nilda Simmer, MD  PHYSICIAN ASSISTANT: Julien Girt PA-C  ASSISTANTS: Julien Girt PA-C   ANESTHESIA:   general  EBL:     BLOOD ADMINISTERED:none  DRAINS: none   LOCAL MEDICATIONS USED:  MARCAINE 5 CC  SPECIMEN:  No Specimen  DISPOSITION OF SPECIMEN:  N/A  COUNTS:  YES  TOURNIQUET: TOURNIQUET TIME  40 MIN  DICTATION: .VERBAL DICTATION  PLAN OF CARE: Discharge to home after PACU  PATIENT DISPOSITION:  PACU - hemodynamically stable.   Delay start of Pharmacological VTE agent (>24hrs) due to surgical blood loss or risk of bleeding: NA

## 2011-03-01 NOTE — Transfer of Care (Signed)
Immediate Anesthesia Transfer of Care Note  Patient: Monica Kane  Procedure(s) Performed:  TENNIS ELBOW RELEASE - TENOTOMY ELBOW LATERAL EPICONDYLITIS TENNIS ELBOW  Patient Location: PACU  Anesthesia Type: General  Level of Consciousness: sedated  Airway & Oxygen Therapy: Patient Spontanous Breathing and Patient connected to face mask oxygen  Post-op Assessment: Report given to PACU RN and Post -op Vital signs reviewed and stable  Post vital signs: Reviewed and stable  Complications: No apparent anesthesia complications

## 2011-03-01 NOTE — H&P (View-Only) (Signed)
Monica Kane is an 65 y.o. female.   Chief Complaint: left elbow pain HPI: Monica Kane is seen for follow up evaluation for continued left elbow pain with pain both laterally, medially and posteriorly.  She underwent an MRI of the left elbow previously that showed no tear, either medial or lateral with mild epicondylitis.  She also underwent EMG/PNCVs of her left upper arm that showed no specific nerve abnormalities.  She had undergone a right elbow lateral epicondylar release and did well in 2010.  She has been in physical therapy for the left elbow with minimal relief.  She has also tried both Naproxen and Voltaren with minimal relief.  Past Medical History  Diagnosis Date  . Hypertension   . Arthritis   . Depression   . Migraine     Past Surgical History  Procedure Date  . Abdominal hysterectomy 1989  . Lateral release right elbow 2010  . Shoulder arthroscopy w/ rotator cuff repair 2006  . Cervical spine surgery 2004    C 5-6  . Parotid gland tumor excision 1976    benign    History reviewed. No pertinent family history. Social History:  reports that she has never smoked. She does not have any smokeless tobacco history on file. She reports that she drinks alcohol. Her drug history not on file.  Allergies:  Allergies  Allergen Reactions  . Penicillins Swelling and Rash      PENICILLINS   Height: 5\' 5"  (1.651 m)   Weight:              Smoking: Never Smoker   Smokeless Tobacco: Unknown     No current outpatient prescriptions on file as of 02/24/2011.   No current facility-administered medications on file as of 02/24/2011.    carvedilol (COREG) 3.125 MG tablet   rosuvastatin (CRESTOR) 10 MG tablet   sertraline (ZOLOFT) 100 MG tablet   thiamine (VITAMIN B-1) 100 MG tablet   traZODone (DESYREL) 50 MG tablet        No results found for this or any previous visit (from the past 48 hour(s)). No results found.  Review of Systems  Constitutional: Negative.     HENT: Negative for hearing loss, ear pain, nosebleeds, congestion, sore throat, neck pain, tinnitus and ear discharge.   Eyes: Positive for blurred vision. Negative for double vision, photophobia, pain, discharge and redness.       Cataracts  Respiratory: Negative.  Negative for stridor.   Cardiovascular: Negative.   Gastrointestinal: Negative.   Genitourinary: Negative.   Musculoskeletal: Positive for joint pain. Negative for myalgias, back pain and falls.       Left elbow  Skin: Negative.   Neurological: Positive for headaches.  Endo/Heme/Allergies: Negative.   Psychiatric/Behavioral: Positive for depression. Negative for suicidal ideas, hallucinations, memory loss and substance abuse. The patient has insomnia. The patient is not nervous/anxious.     Height 5\' 5"  (1.651 m), weight 70.308 kg (155 lb). Physical Exam  Constitutional: She is oriented to person, place, and time. She appears well-developed and well-nourished.  HENT:  Head: Normocephalic and atraumatic.  Eyes: Conjunctivae and EOM are normal. Pupils are equal, round, and reactive to light.  Neck: Neck supple.  Cardiovascular: Normal rate and regular rhythm.   Respiratory: Effort normal and breath sounds normal.  GI: Soft. Bowel sounds are normal.  Genitourinary:       Not pertinent to current symptomatology therefore not examined.  Musculoskeletal:       Examination of her  left elbow reveals pain laterally, posteriorly and medially.  She has minimal swelling.  Full range of motion with diffuse pain.  No instability.  Examination of her right elbow reveals full range of motion without pain, swelling, weakness or instability.  Vascular exam: Pulses are 2+ and symmetric.   Neurological: She is alert and oriented to person, place, and time.  Skin: Skin is warm and dry.  Psychiatric: She has a normal mood and affect. Her behavior is normal. Judgment and thought content normal.     Assessment: Patient Active Problem List   Diagnoses  . Lateral epicondylitis of left elbow  . Migraine  . Depression  . Cataract  . Arthritis    Plan: The risks, benefits, and possible complications of the lateral elbow release procedure were discussed in detail with the patient.  The patient is without question.  We will proceed with surgery as an outpatient.  Jakai Onofre A. Gwinda Passe Physician Assistant [facility] [phone]  02/24/2011, 7:16 PM Monica Kane 02/24/2011, 6:37 PM

## 2011-03-01 NOTE — Anesthesia Procedure Notes (Addendum)
Procedure Name: LMA Insertion Date/Time: 03/01/2011 9:39 AM Performed by: Signa Kell Pre-anesthesia Checklist: Patient identified, Emergency Drugs available, Suction available, Patient being monitored and Timeout performed Patient Re-evaluated:Patient Re-evaluated prior to inductionOxygen Delivery Method: Circle System Utilized Preoxygenation: Pre-oxygenation with 100% oxygen Intubation Type: IV induction Ventilation: Mask ventilation without difficulty LMA: LMA inserted LMA Size: 4.0 Number of attempts: 1 Airway Equipment and Method: bite block Placement Confirmation: positive ETCO2 Tube secured with: Tape Dental Injury: Teeth and Oropharynx as per pre-operative assessment

## 2011-03-02 NOTE — Op Note (Signed)
NAME:  Monica Kane, Monica Kane                    ACCOUNT NO.:  MEDICAL RECORD NO.:  [id number]  LOCATION:                                 FACILITY:  PHYSICIAN:  Alanys Godino A. Thurston Hole, M.D.      DATE OF BIRTH:  DATE OF PROCEDURE:  03/01/2011 DATE OF DISCHARGE:                              OPERATIVE REPORT   PREOPERATIVE DIAGNOSIS:  Left elbow lateral epicondylitis.  POSTOPERATIVE DIAGNOSIS:  Left elbow lateral epicondylitis  OPERATION:  Left elbow lateral epicondylar release, debridement, and repair.  ASSISTANT:  Kirstin Shepperson, PA-C.  ANESTHESIA:  General.  OPERATIVE TIME:  45 minutes.  COMPLICATIONS:  None.  INDICATION FOR PROCEDURE:  The patient is a 65 year old woman who has had 8-10 months of increasing left elbow pain with exam on MRI documenting significant lateral epicondylitis.  She has failed multiple conservative modalities and is now to undergo lateral epicondylar release, debridement, and repair.  DESCRIPTION OF PROCEDURE:  The patient was brought to the operating room on March 01, 2011, placed on operating table in supine position. After being placed under general anesthesia, her left elbow was then examined.  She had full range of motion.  Elbow was stable.  She received Ancef 1 g IV preoperatively for prophylaxis.  The left arm was then prepped using sterile DuraPrep and draped using sterile technique. Time-out procedure was called and the correct left arm was identified. The left arm was then exsanguinated and a tourniquet elevated to 250 mmHg.  Initially through a 3-4-cm longitudinal incision, based over the lateral epicondyle, initial exposure was made.  The underlying subcutaneous tissues were incised along with skin incision.  Fascia overlying the epicondylar tendons was then incised and carefully retracted.  Radial nerve was carefully protected as well.  She was found to have significant tendinosis and inflammatory tissue in and around the tendinous  attachments of the ECRB and ECRL tendons and this was thoroughly debrided and released.  The radiocapitellar joint was not entered.  Underneath, the attachment site was found to be a very prominent spur and this was resected with a rongeur.  After this was done and all debridement of unhealthy tissue was carried out, then the remaining healthy portions of the tendon were reattached to the lateral epicondyle through 2 drill holes in the lateral epicondyle, approximately 2-3 mm distal to their original attachment side under less tension.  These sutures were passed through bony tunnels and then tied down over bone with firm and tight fixation.  At this point, there was found to be excellent repair and that the elbow could be brought through a full range of motion with no excessive tension, but excellent stability on the repair.  At this point, it is felt that all the pathology have been satisfactorily addressed.  The wound was irrigated and then the fascia was closed over the repair with running 2-0 Vicryl suture.  Subcutaneous tissue was closed with 3-0 Vicryl.  Subcuticular layer was closed with 4-0 Prolene.  Sterile dressings were applied and a long arm splint at 90 degrees.  Tourniquet was released and then the patient was awakened and taken to the recovery room in stable condition.  Needle and sponge counts were correct x2 at the end of the case.  FOLLOWUP CARE:  The patient will be followed as an outpatient on Percocet and Robaxin, will be seen back in the office in a week for sutures out and followup.     [name], M.D.     RAW/MEDQ  D:  03/01/2011  T:  03/01/2011  Job:  [id number]

## 2011-03-05 ENCOUNTER — Encounter (HOSPITAL_BASED_OUTPATIENT_CLINIC_OR_DEPARTMENT_OTHER): Payer: Self-pay | Admitting: Orthopedic Surgery

## 2011-03-24 ENCOUNTER — Ambulatory Visit (INDEPENDENT_AMBULATORY_CARE_PROVIDER_SITE_OTHER): Payer: 59 | Admitting: Sports Medicine

## 2011-03-24 VITALS — BP 138/80

## 2011-03-24 DIAGNOSIS — M7742 Metatarsalgia, left foot: Secondary | ICD-10-CM | POA: Insufficient documentation

## 2011-03-24 DIAGNOSIS — M79609 Pain in unspecified limb: Secondary | ICD-10-CM

## 2011-03-24 DIAGNOSIS — M79672 Pain in left foot: Secondary | ICD-10-CM | POA: Insufficient documentation

## 2011-03-24 DIAGNOSIS — M2020 Hallux rigidus, unspecified foot: Secondary | ICD-10-CM | POA: Insufficient documentation

## 2011-03-24 DIAGNOSIS — M7750 Other enthesopathy of unspecified foot: Secondary | ICD-10-CM

## 2011-03-24 MED ORDER — AMITRIPTYLINE HCL 25 MG PO TABS: 25.0000 mg | ORAL_TABLET | Freq: Every day | ORAL | Status: DC

## 2011-03-24 NOTE — Patient Instructions (Addendum)
Please start amitriptyline 25 mg at bedtime in combination with trazodone.   Wear shoes with metatarsal pads as much as possible.  It is ok to dance using shoes with the metatarsal pad  Please start vitamin B6 at least 50 mg daily  Please follow up in 1 month  Thank you for seeing [place] today!

## 2011-03-24 NOTE — Progress Notes (Signed)
  Subjective:    Patient ID: Monica Kane, female    DOB: 1946-02-18, 65 y.o.   MRN: [id number]  HPI  Pt presents to clinic for 2nd opinion for  Lt foot pain between 2-3 toes since this may.  Pain is now causing her problems falling asleep. Pain with walking.  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 Thurston Hole who placed MT pad, and did steroid shot between 2-3 MT heads.  Dr. Lajoyce Corners did 2 more injections between 3-4 MT heads. Injections were only slightly helpful.  Dr. Lajoyce Corners recommends removing neuroma and shortening 2nd and 3rd metatarsals  She would like to consider a nonsurgical option if possible. Since she did get some relief with the change to MBT shoes and withMT pad f/ Dr. Thurston Hole she wonders if an orthotic or some other padding might be helpful   Review of Systems     Objective:   Physical Exam No acute distress  Splaying bt 2-3 toes on lt Mild hallux deviation rt > lt Slight subluxation of 5th mtp Transverse arch on lt collapsed more than rt Long arch on rt more pronated Great toe motion limited - 30 deg extension, 15 deg flexion  Post tib functioning well No significant leg length difference  Poor great toe motion on the left consistent with hallux rigidus with only 30 of extension and 20 of flexion.  The right great toe gets about 45 of extension and 20 of flexion.  There is more callus on the left and loss of transverse arch.     Assessment & Plan:

## 2011-03-24 NOTE — Assessment & Plan Note (Signed)
We may treat with a first ray post in the future but no changes made today

## 2011-03-24 NOTE — Assessment & Plan Note (Signed)
Metatarsal pad were added to her dance practice shoes and to her walking shoes   try these for one month and then we will recheck them and add into other shoes if they are working

## 2011-03-24 NOTE — Assessment & Plan Note (Signed)
She does have what sounds like nerve-related pain and numbness is worse toward nighttime.  She currently takes trazodone 50 and we will add amitriptyline 20 5 at night to see if this helps her sleep through the night and lessens the pain

## 2011-03-30 ENCOUNTER — Ambulatory Visit (INDEPENDENT_AMBULATORY_CARE_PROVIDER_SITE_OTHER): Payer: 59 | Admitting: Emergency Medicine

## 2011-03-30 DIAGNOSIS — E785 Hyperlipidemia, unspecified: Secondary | ICD-10-CM

## 2011-03-30 DIAGNOSIS — Z79899 Other long term (current) drug therapy: Secondary | ICD-10-CM

## 2011-03-30 DIAGNOSIS — I10 Essential (primary) hypertension: Secondary | ICD-10-CM

## 2011-03-30 DIAGNOSIS — Z0000 Encounter for general adult medical examination without abnormal findings: Secondary | ICD-10-CM

## 2011-03-31 ENCOUNTER — Encounter: Payer: Self-pay | Admitting: Family Medicine

## 2011-03-31 DIAGNOSIS — I10 Essential (primary) hypertension: Secondary | ICD-10-CM | POA: Insufficient documentation

## 2011-03-31 DIAGNOSIS — R739 Hyperglycemia, unspecified: Secondary | ICD-10-CM | POA: Insufficient documentation

## 2011-03-31 DIAGNOSIS — E785 Hyperlipidemia, unspecified: Secondary | ICD-10-CM | POA: Insufficient documentation

## 2011-03-31 DIAGNOSIS — E559 Vitamin D deficiency, unspecified: Secondary | ICD-10-CM | POA: Insufficient documentation

## 2011-04-20 HISTORY — PX: OTHER SURGICAL HISTORY: SHX169

## 2011-04-20 HISTORY — PX: FOOT SURGERY: SHX648

## 2011-04-22 ENCOUNTER — Ambulatory Visit: Payer: 59 | Admitting: Sports Medicine

## 2011-04-26 ENCOUNTER — Ambulatory Visit (INDEPENDENT_AMBULATORY_CARE_PROVIDER_SITE_OTHER): Payer: 59 | Admitting: Sports Medicine

## 2011-04-26 VITALS — BP 130/80

## 2011-04-26 DIAGNOSIS — M79672 Pain in left foot: Secondary | ICD-10-CM

## 2011-04-26 DIAGNOSIS — M79609 Pain in unspecified limb: Secondary | ICD-10-CM

## 2011-04-26 DIAGNOSIS — M7742 Metatarsalgia, left foot: Secondary | ICD-10-CM

## 2011-04-26 DIAGNOSIS — M7750 Other enthesopathy of unspecified foot: Secondary | ICD-10-CM

## 2011-04-26 NOTE — Patient Instructions (Signed)
1. Start wearing the metatarsal cookies that we gave you today in your shoes.    2. Follow up in one month.

## 2011-04-26 NOTE — Assessment & Plan Note (Signed)
Given additional MT cookies to try in other shoes  Reck in 1 mo

## 2011-04-26 NOTE — Assessment & Plan Note (Signed)
Continue amitriptyline as this is helping her nerve related pain.  We will discontinue metatarsal pads and start metatarsal cookies.  We have asked her to try these in all of her shoes including her dance shoes.  The pad gave her relief with standing in her rockerbottom shoes but not with walking.  This could be due to the motion of the rocker bottom.  She did have a better relief of her symptoms in the dance shoes.  This suggests that the rocker bottom mechanism is part of the reason she continues to have pain.

## 2011-04-26 NOTE — Progress Notes (Signed)
  Subjective:    Patient ID: Monica Kane, female    DOB: [birth year], 66 y.o.   MRN: [id number]  HPI This is a 66 y/o female here to follow up on left foot pain secondary to metatarsalgia and a morton's neuroma.  Amitriptyline has improved the pain that was radiating on the forefoot into the toes.  The pain from the metatarsalgia continues.  She is wearing metatarsal pads in her rockerbottom sandals and in her dance shoes.   Pain is less in dance shoes than in her sketchers.   Review of Systems     Objective:   Physical Exam NAD  Left foot Splaying bt 2-3 toes on lt  Slight subluxation of 5th mtp  Transverse arch collapsed with callus formation over the middle metatarsals Tender to palpation over the metatarsal heads 2nd and 3rd  Tender to palpation in the interdigital space between the 2nd and 3rd metatarsals        Assessment & Plan:

## 2011-05-24 ENCOUNTER — Telehealth: Payer: Self-pay

## 2011-05-24 NOTE — Telephone Encounter (Signed)
.  [facility] STATES THEY NEED THE LAST PE AND CULTURES FAXED TO THEM THE FAX IS [phone] AND THE PHONE NUMBER IS [phone]

## 2011-05-25 NOTE — Telephone Encounter (Signed)
[facility] NEEDS LAST OV,LABS,EKG FAXED TO HER @ [phone]   [facility]

## 2011-05-27 ENCOUNTER — Ambulatory Visit: Payer: 59 | Admitting: Sports Medicine

## 2011-09-28 ENCOUNTER — Ambulatory Visit (INDEPENDENT_AMBULATORY_CARE_PROVIDER_SITE_OTHER): Payer: 59 | Admitting: Emergency Medicine

## 2011-09-28 ENCOUNTER — Encounter: Payer: Self-pay | Admitting: Emergency Medicine

## 2011-09-28 VITALS — BP 132/77 | HR 67 | Temp 98.3°F | Resp 16

## 2011-09-28 DIAGNOSIS — E559 Vitamin D deficiency, unspecified: Secondary | ICD-10-CM

## 2011-09-28 DIAGNOSIS — E789 Disorder of lipoprotein metabolism, unspecified: Secondary | ICD-10-CM

## 2011-09-28 DIAGNOSIS — J3489 Other specified disorders of nose and nasal sinuses: Secondary | ICD-10-CM

## 2011-09-28 DIAGNOSIS — M810 Age-related osteoporosis without current pathological fracture: Secondary | ICD-10-CM

## 2011-09-28 LAB — CBC WITH DIFFERENTIAL/PLATELET
Basophils Absolute: 0 10*3/uL (ref 0.0–0.1)
Basophils Relative: 1 % (ref 0–1)
Eosinophils Relative: 2 % (ref 0–5)
HCT: 40.8 % (ref 36.0–46.0)
Lymphocytes Relative: 22 % (ref 12–46)
MCHC: 33.1 g/dL (ref 30.0–36.0)
MCV: 78 fL (ref 78.0–100.0)
Monocytes Absolute: 0.2 10*3/uL (ref 0.1–1.0)
Platelets: 207 10*3/uL (ref 150–400)
RDW: 14 % (ref 11.5–15.5)
WBC: 4.1 10*3/uL (ref 4.0–10.5)

## 2011-09-28 LAB — LIPID PANEL
HDL: 51 mg/dL (ref >39)
LDL Cholesterol: 85 mg/dL (ref 0–99)
Total CHOL/HDL Ratio: 3 Ratio
Triglycerides: 90 mg/dL (ref <150)

## 2011-09-28 MED ORDER — DOXYCYCLINE HYCLATE 100 MG PO CAPS: 100.0000 mg | ORAL_CAPSULE | Freq: Two times a day (BID) | ORAL | Status: AC

## 2011-09-28 MED ORDER — MUPIROCIN 2 % EX OINT: TOPICAL_OINTMENT | Freq: Three times a day (TID) | CUTANEOUS | Status: AC

## 2011-09-28 NOTE — Progress Notes (Signed)
  Subjective:    Patient ID: Monica Kane, female    DOB: 07-06-1945, 66 y.o.   MRN: [id number]  HPI patient here to followup on her high cholesterol. She also has a tender area on the right side of her nose which has cracked and has recently become very painful. She's also concerned about osteoporosis and bone density. Since her last visit here she has had a surgery on her foot.    Review of Systems     Objective:   Physical Exam  HENT:       Examination nose reveals the right nares to be cracked. There is redness and tenderness over the tip of the nose  Cardiovascular: Regular rhythm and normal heart sounds.   Pulmonary/Chest: No respiratory distress. She has no wheezes.          Assessment & Plan:  Assessment is hyperlipidemia. We'll go ahead and repeat her lipid panel today. She also has what appears to be a staph infection of the right side of her nose and we'll treat this with Bactroban and doxycycline. A culture was also done. Schedule her for a bone density study in that she 24 and not had this done to

## 2011-09-29 LAB — VITAMIN D 25 HYDROXY (VIT D DEFICIENCY, FRACTURES): Vit D, 25-Hydroxy: 26 ng/mL — ABNORMAL LOW (ref 30–89)

## 2011-10-01 LAB — WOUND CULTURE

## 2011-10-12 ENCOUNTER — Other Ambulatory Visit: Payer: 59

## 2012-03-06 ENCOUNTER — Other Ambulatory Visit: Payer: Self-pay | Admitting: Emergency Medicine

## 2012-03-06 DIAGNOSIS — Z1231 Encounter for screening mammogram for malignant neoplasm of breast: Secondary | ICD-10-CM

## 2012-03-10 ENCOUNTER — Encounter: Payer: 59 | Admitting: Family Medicine

## 2012-03-22 ENCOUNTER — Ambulatory Visit (INDEPENDENT_AMBULATORY_CARE_PROVIDER_SITE_OTHER): Payer: 59 | Admitting: Family Medicine

## 2012-03-22 VITALS — BP 124/68 | HR 82 | Temp 98.4°F | Resp 18 | Ht 64.5 in | Wt 159.4 lb

## 2012-03-22 DIAGNOSIS — R05 Cough: Secondary | ICD-10-CM

## 2012-03-22 DIAGNOSIS — H109 Unspecified conjunctivitis: Secondary | ICD-10-CM

## 2012-03-22 DIAGNOSIS — R059 Cough, unspecified: Secondary | ICD-10-CM

## 2012-03-22 DIAGNOSIS — J069 Acute upper respiratory infection, unspecified: Secondary | ICD-10-CM

## 2012-03-22 MED ORDER — HYDROCODONE-HOMATROPINE 5-1.5 MG/5ML PO SYRP: 5.0000 mL | ORAL_SOLUTION | Freq: Three times a day (TID) | ORAL | Status: DC | PRN

## 2012-03-22 MED ORDER — MOXIFLOXACIN HCL 0.5 % OP SOLN: 1.0000 [drp] | Freq: Three times a day (TID) | OPHTHALMIC | Status: DC

## 2012-03-22 NOTE — Progress Notes (Signed)
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 Over the last 48 hours she's developed mattering in her left thigh with redness and irritation. Patient denies nausea vomiting, diarrhea, fever, chills, malaise, myalgia  Objective: No acute distress HEENT negative with the exception of inflamed nasal passages, reddened conjunctiva on the left, and mattering along the edges of the lid margins. Chest: Few rhonchi Heart: Regular no murmur Skin: Normal without rashes  Assessment: Conjunctivitis, cough with bronchitis 1. Conjunctivitis  moxifloxacin (VIGAMOX) 0.5 % ophthalmic solution  2. Cough  HYDROcodone-homatropine (HYCODAN) 5-1.5 MG/5ML syrup  3. URI (upper respiratory infection)  HYDROcodone-homatropine (HYCODAN) 5-1.5 MG/5ML syrup

## 2012-03-22 NOTE — Patient Instructions (Addendum)
Conjunctivitis Conjunctivitis is commonly called "pink eye." Conjunctivitis can be caused by bacterial or viral infection, allergies, or injuries. There is usually redness of the lining of the eye, itching, discomfort, and sometimes discharge. There may be deposits of matter along the eyelids. A viral infection usually causes a watery discharge, while a bacterial infection causes a yellowish, thick discharge. Pink eye is very contagious and spreads by direct contact. You may be given antibiotic eyedrops as part of your treatment. Before using your eye medicine, remove all drainage from the eye by washing gently with warm water and cotton balls. Continue to use the medication until you have awakened 2 mornings in a row without discharge from the eye. Do not rub your eye. This increases the irritation and helps spread infection. Use separate towels from other household members. Wash your hands with soap and water before and after touching your eyes. Use cold compresses to reduce pain and sunglasses to relieve irritation from light. Do not wear contact lenses or wear eye makeup until the infection is gone. SEEK MEDICAL CARE IF:   Your symptoms are not better after 3 days of treatment.  You have increased pain or trouble seeing.  The outer eyelids become very red or swollen. Document Released: 05/13/2004 Document Revised: 06/28/2011 Document Reviewed: 04/05/2005 Exit[employer] Patient Information 2013 [employer].  

## 2012-03-28 ENCOUNTER — Encounter: Payer: Self-pay | Admitting: Family Medicine

## 2012-03-28 ENCOUNTER — Ambulatory Visit (INDEPENDENT_AMBULATORY_CARE_PROVIDER_SITE_OTHER): Payer: 59 | Admitting: Family Medicine

## 2012-03-28 VITALS — BP 150/82 | HR 70 | Temp 98.2°F | Resp 16 | Ht 65.0 in | Wt 158.0 lb

## 2012-03-28 DIAGNOSIS — R5383 Other fatigue: Secondary | ICD-10-CM

## 2012-03-28 DIAGNOSIS — R5381 Other malaise: Secondary | ICD-10-CM

## 2012-03-28 DIAGNOSIS — Z0000 Encounter for general adult medical examination without abnormal findings: Secondary | ICD-10-CM

## 2012-03-28 LAB — CBC WITH DIFFERENTIAL/PLATELET
Basophils Relative: 0 % (ref 0–1)
HCT: 40.5 % (ref 36.0–46.0)
Hemoglobin: 13.3 g/dL (ref 12.0–15.0)
Lymphocytes Relative: 18 % (ref 12–46)
Lymphs Abs: 1.4 10*3/uL (ref 0.7–4.0)
MCHC: 32.8 g/dL (ref 30.0–36.0)
Monocytes Absolute: 0.4 10*3/uL (ref 0.1–1.0)
Monocytes Relative: 5 % (ref 3–12)
Neutro Abs: 5.6 10*3/uL (ref 1.7–7.7)
Neutrophils Relative %: 76 % (ref 43–77)
RBC: 5.12 MIL/uL — ABNORMAL HIGH (ref 3.87–5.11)
WBC: 7.5 10*3/uL (ref 4.0–10.5)

## 2012-03-28 LAB — COMPREHENSIVE METABOLIC PANEL
Albumin: 4.4 g/dL (ref 3.5–5.2)
Alkaline Phosphatase: 81 U/L (ref 39–117)
Calcium: 9.6 mg/dL (ref 8.4–10.5)
Chloride: 102 mEq/L (ref 96–112)
Glucose, Bld: 96 mg/dL (ref 70–99)
Potassium: 4.2 mEq/L (ref 3.5–5.3)
Sodium: 144 mEq/L (ref 135–145)
Total Protein: 6.5 g/dL (ref 6.0–8.3)

## 2012-03-28 LAB — T3, FREE: T3, Free: 3.1 pg/mL (ref 2.3–4.2)

## 2012-03-28 LAB — POCT URINALYSIS DIPSTICK
Bilirubin, UA: NEGATIVE
Glucose, UA: NEGATIVE
Ketones, UA: NEGATIVE
Leukocytes, UA: NEGATIVE
Nitrite, UA: NEGATIVE

## 2012-03-28 LAB — LIPID PANEL
LDL Cholesterol: 106 mg/dL — ABNORMAL HIGH (ref 0–99)
Triglycerides: 57 mg/dL (ref <150)

## 2012-03-28 MED ORDER — AMITRIPTYLINE HCL 25 MG PO TABS: 25.0000 mg | ORAL_TABLET | Freq: Every day | ORAL | Status: DC

## 2012-03-28 NOTE — Patient Instructions (Addendum)
Keeping You Healthy  Get These Tests  Blood Pressure- Have your blood pressure checked by your healthcare provider at least once a year.  Normal blood pressure is 120/80.  Weight- Have your body mass index (BMI) calculated to screen for obesity.  BMI is a measure of body fat based on height and weight.  You can calculate your own BMI at https://www.west-esparza.com/  Cholesterol- Have your cholesterol checked every year.  Diabetes- Have your blood sugar checked every year if you have high blood pressure, high cholesterol, a family history of diabetes or if you are overweight.  Pap Smear- Have a pap smear every 1 to 3 years if you have been sexually active.  If you are older than 65 and recent pap smears have been normal you may not need additional pap smears.  In addition, if you have had a hysterectomy  For benign disease additional pap smears are not necessary.  Mammogram-Yearly mammograms are essential for early detection of breast cancer  Screening for Colon Cancer- Colonoscopy starting at age 40. Screening may begin sooner depending on your family history and other health conditions.  Follow up colonoscopy as directed by your Gastroenterologist.  Screening for Osteoporosis- Screening begins at age 51 with bone density scanning, sooner if you are at higher risk for developing Osteoporosis.  Get these medicines  Calcium with Vitamin D- Your body requires 1200-1500 mg of Calcium a day and [phone] IU of Vitamin D a day.  You can only absorb 500 mg of Calcium at a time therefore Calcium must be taken in 2 or 3 separate doses throughout the day.  Hormones- Hormone therapy has been associated with increased risk for certain cancers and heart disease.  Talk to your healthcare provider about if you need relief from menopausal symptoms.  Aspirin- Ask your healthcare provider about taking Aspirin to prevent Heart Disease and Stroke.  Get these Immuniztions  Flu shot- Every fall  Pneumonia  shot- Once after the age of 85; if you are younger ask your healthcare provider if you need a pneumonia shot.  Tetanus- Every ten years. You need a Tdap but I would recommend you return after you have fully recovered from this current illness.  Zostavax- Once after the age of 31 to prevent shingles.  Take these steps  Don't smoke- Your healthcare provider can help you quit. For tips on how to quit, ask your healthcare provider or go to www.smokefree.gov or call 1-800 QUIT-NOW.  Be physically active- Exercise 5 days a week for a minimum of 30 minutes.  If you are not already physically active, start slow and gradually work up to 30 minutes of moderate physical activity.  Try walking, dancing, bike riding, swimming, etc.  Eat a healthy diet- Eat a variety of healthy foods such as fruits, vegetables, whole grains, low fat milk, low fat cheeses, yogurt, lean meats, chicken, fish, eggs, dried beans, tofu, etc.  For more information go to www.thenutritionsource.org  Dental visit- Brush and floss teeth twice daily; visit your dentist twice a year.  Eye exam- Visit your Optometrist or Ophthalmologist yearly.  Drink alcohol in moderation- Limit alcohol intake to one drink or less a day.  Never drink and drive.  Depression- Your emotional health is as important as your physical health.  If you're feeling down or losing interest in things you normally enjoy, please talk to your healthcare provider.  Seat Belts- can save your life; always wear one  Smoke/Carbon Monoxide detectors- These detectors need to be installed  on the appropriate level of your home.  Replace batteries at least once a year.  Violence- If anyone is threatening or hurting you, please tell your healthcare provider.  Living Will/ Health care power of attorney- Discuss with your healthcare provider and family.    If you are not feeling better within the next week or two and continue to have fatigue, cough and chest tightness or  feel worse in any way, please contact the office

## 2012-03-28 NOTE — Progress Notes (Signed)
Subjective:    Patient ID: Monica Kane, female    DOB: Dec 20, 1945, 66 y.o.   MRN: [id number]  HPI  This 66 y.o. Cauc female comes in for annual CPE; she is recovering from URI/conjunctivitis  and is still having eye symptoms with cough and chest tightness. She is having some fatigue,  onset since this URI started. She finished Z-PAK today.   Last PAP: 2012 (normal)  Last MMG: 2012 (normal)- sch for 04/10/12  Last ECG: 2012 (normal)  DEXA: Sch for 04/10/12  Last Colonoscopy: 5 years ago; sch for Jan 2014    Review of Systems  Constitutional: Positive for chills and fatigue.  HENT: Positive for nosebleeds.   Eyes: Positive for discharge, redness and itching.  Respiratory: Positive for cough and chest tightness.   Cardiovascular: Negative.   Gastrointestinal: Negative.   Genitourinary: Positive for vaginal pain.  Musculoskeletal: Negative.   Neurological: Negative.   Hematological: Negative.        Objective:   Physical Exam  Nursing note and vitals reviewed. Constitutional: She is oriented to person, place, and time. She appears well-developed and well-nourished. No distress.  HENT:  Head: Normocephalic and atraumatic.  Right Ear: Hearing, tympanic membrane, external ear and ear canal normal.  Left Ear: Hearing, tympanic membrane, external ear and ear canal normal.  Nose: Nose normal. No nasal deformity or septal deviation. Right sinus exhibits no maxillary sinus tenderness and no frontal sinus tenderness. Left sinus exhibits no maxillary sinus tenderness and no frontal sinus tenderness.  Mouth/Throat: Uvula is midline and mucous membranes are normal. No oral lesions. Normal dentition. No dental caries. Posterior oropharyngeal erythema present. No oropharyngeal exudate.  Eyes: Conjunctivae normal, EOM and lids are normal. Pupils are equal, round, and reactive to light. No scleral icterus.  Fundoscopic exam:      The right eye shows no papilledema. The right eye shows red  reflex.      The left eye shows no papilledema. The left eye shows red reflex. Neck: Normal range of motion. Neck supple. No thyromegaly present.  Cardiovascular: Normal rate, regular rhythm, normal heart sounds and intact distal pulses.  Exam reveals no gallop and no friction rub.   No murmur heard. Pulmonary/Chest: Effort normal and breath sounds normal. No respiratory distress. She has no wheezes. Right breast exhibits no inverted nipple, no mass, no nipple discharge, no skin change and no tenderness. Left breast exhibits no inverted nipple, no mass, no nipple discharge, no skin change and no tenderness. Breasts are symmetrical.       Areas of thickened tissue in upper outer quadrants of both breasts  Abdominal: Soft. Normal appearance and bowel sounds are normal. She exhibits no distension, no abdominal bruit, no pulsatile midline mass and no mass. There is no hepatosplenomegaly. There is no tenderness. There is no guarding and no CVA tenderness.  Genitourinary:       NEFG; pelvic and DRE deferred  Musculoskeletal: Normal range of motion. She exhibits no edema and no tenderness.  Lymphadenopathy:    She has no cervical adenopathy.  Neurological: She is alert and oriented to person, place, and time. She has normal reflexes. No cranial nerve deficit. She exhibits normal muscle tone. Coordination normal.  Skin: Skin is warm and dry. There is pallor.       Pt has annual DERM exam  Psychiatric: She has a normal mood and affect. Her behavior is normal. Judgment and thought content normal.          Assessment &  Plan:   1. Routine general medical examination at a health care facility  POCT urinalysis dipstick, CBC with Differential, Comprehensive metabolic panel, Lipid panel, Vitamin D 25 hydroxy, TSH, T3, free  2. Other malaise and fatigue  CBC with Differential, Comprehensive metabolic panel, Vitamin D 25 hydroxy, TSH, T3, free

## 2012-03-29 ENCOUNTER — Encounter (HOSPITAL_COMMUNITY): Payer: Self-pay | Admitting: Emergency Medicine

## 2012-03-29 ENCOUNTER — Emergency Department (HOSPITAL_COMMUNITY)
Admission: EM | Admit: 2012-03-29 | Discharge: 2012-03-30 | Disposition: A | Discharge status: Home / Self Care | Payer: 59 | Attending: Emergency Medicine | Admitting: Emergency Medicine

## 2012-03-29 DIAGNOSIS — Z8679 Personal history of other diseases of the circulatory system: Secondary | ICD-10-CM | POA: Insufficient documentation

## 2012-03-29 DIAGNOSIS — Z79899 Other long term (current) drug therapy: Secondary | ICD-10-CM | POA: Insufficient documentation

## 2012-03-29 DIAGNOSIS — I10 Essential (primary) hypertension: Secondary | ICD-10-CM | POA: Insufficient documentation

## 2012-03-29 DIAGNOSIS — R6884 Jaw pain: Secondary | ICD-10-CM | POA: Insufficient documentation

## 2012-03-29 DIAGNOSIS — N200 Calculus of kidney: Secondary | ICD-10-CM

## 2012-03-29 DIAGNOSIS — E785 Hyperlipidemia, unspecified: Secondary | ICD-10-CM | POA: Insufficient documentation

## 2012-03-29 DIAGNOSIS — F3289 Other specified depressive episodes: Secondary | ICD-10-CM | POA: Insufficient documentation

## 2012-03-29 DIAGNOSIS — F329 Major depressive disorder, single episode, unspecified: Secondary | ICD-10-CM | POA: Insufficient documentation

## 2012-03-29 DIAGNOSIS — Z8669 Personal history of other diseases of the nervous system and sense organs: Secondary | ICD-10-CM | POA: Insufficient documentation

## 2012-03-29 LAB — URINALYSIS, ROUTINE W REFLEX MICROSCOPIC
Glucose, UA: NEGATIVE mg/dL
Specific Gravity, Urine: 1.023 (ref 1.005–1.030)
Urobilinogen, UA: 0.2 mg/dL (ref 0.0–1.0)
pH: 5.5 (ref 5.0–8.0)

## 2012-03-29 LAB — URINE MICROSCOPIC-ADD ON

## 2012-03-29 LAB — CBC
Platelets: 209 10*3/uL (ref 150–400)
RBC: 5.3 MIL/uL — ABNORMAL HIGH (ref 3.87–5.11)
RDW: 13.2 % (ref 11.5–15.5)
WBC: 6.9 10*3/uL (ref 4.0–10.5)

## 2012-03-29 LAB — COMPREHENSIVE METABOLIC PANEL
AST: 20 U/L (ref 0–37)
Albumin: 4.2 g/dL (ref 3.5–5.2)
Calcium: 10.1 mg/dL (ref 8.4–10.5)
Creatinine, Ser: 1.04 mg/dL (ref 0.50–1.10)
GFR calc non Af Amer: 56 mL/min — ABNORMAL LOW (ref >90)
Sodium: 143 mEq/L (ref 135–145)
Total Protein: 7.1 g/dL (ref 6.0–8.3)

## 2012-03-29 LAB — VITAMIN D 25 HYDROXY (VIT D DEFICIENCY, FRACTURES): Vit D, 25-Hydroxy: 24 ng/mL — ABNORMAL LOW (ref 30–89)

## 2012-03-29 MED ORDER — HYDROMORPHONE HCL PF 1 MG/ML IJ SOLN
0.5000 mg | Freq: Once | INTRAMUSCULAR | Status: AC
  Administered 2012-03-30: 0.5 mg via INTRAVENOUS
  Filled 2012-03-29: qty 1

## 2012-03-29 MED ORDER — ONDANSETRON HCL 4 MG/2ML IJ SOLN
4.0000 mg | Freq: Once | INTRAMUSCULAR | Status: AC
  Administered 2012-03-30: 4 mg via INTRAVENOUS
  Filled 2012-03-29: qty 2

## 2012-03-29 NOTE — ED Provider Notes (Addendum)
History     CSN: [id number]  Arrival date & time 03/29/12  2005   First MD Initiated Contact with Patient 03/29/12 2314      Chief Complaint  Patient presents with  . Flank Pain  . Jaw Pain    (Consider location/radiation/quality/duration/timing/severity/associated sxs/prior treatment) HPI 66 year old female presents to emergency department complaining of right flank pain intermittently for the last 2 days. Patient reports she was driving to [facility] yesterday when she had acute onset of sharp stabbing pain in her right flank radiating into her abdomen. She reports she became very sleepy and felt she was going to fall asleep at the wheel. She went and saw her dermatologist and slept a lot he prior to being seen by him. Pain had resolved by the time she saw him. Patient was seen by her primary care Dr. today for routine checkup. She did not have the flank pain, but related feeling very sleepy to her pcm. Later in the dayy she had ballroom dancing, and while she was up and active she felt fine but whenever she was sitting she had return of severe flank pain. This evening she had return of the flank pain associated with left jaw pain and nausea and vomiting. She attempted to take her blood pressure at home and the meter was reading in her. She denies any chest pain or shortness of breath. No prior history of similar symptoms.  Past Medical History  Diagnosis Date  . Hypertension   . Depression   . Migraine   . Hyperlipidemia   . Cataract     Past Surgical History  Procedure Date  . Abdominal hysterectomy 1989  . Lateral release right elbow 2010  . Shoulder arthroscopy w/ rotator cuff repair 2006  . Cervical spine surgery 2004    C 5-6  . Parotid gland tumor excision 1976    benign  . Diagnostic laparoscopy 1976    fibroid removed  . Appendectomy   . Colonoscopy   . Cervical fusion 5/12  . Shoulder arthroscopy distal clavicle excision and open rotator cuff repair 2006  . Elbow  arthroscopy 11/10  . Parotid gland tumor excision 1976    rt  . Lateral epicondyle release 03/01/2011    Procedure: TENNIS ELBOW RELEASE;  Surgeon: Nilda Simmer, MD;  Location: [facility];  Service: Orthopedics;  Laterality: Left;  TENOTOMY ELBOW LATERAL EPICONDYLITIS TENNIS ELBOW  . Eye surgery     Family History  Problem Relation Age of Onset  . Cancer Mother   . Cancer Father   . Heart disease Maternal Grandfather     heart attack  . Heart disease Paternal Grandfather     heart attack    History  Substance Use Topics  . Smoking status: Never Smoker   . Smokeless tobacco: Not on file  . Alcohol Use: 0.0 oz/week    2-3 Glasses of wine per week    OB History    Grav Para Term Preterm Abortions TAB SAB Ect Mult Living                  Review of Systems  All other systems reviewed and are negative.    Allergies  Penicillins  Home Medications   Current Outpatient Rx  Name  Route  Sig  Dispense  Refill  . AMITRIPTYLINE HCL 25 MG PO TABS   Oral   Take 1 tablet (25 mg total) by mouth at bedtime.   30 tablet   2   .  CARVEDILOL 3.125 MG PO TABS   Oral   Take 3.125 mg by mouth 2 (two) times daily with a meal.          . VITAMIN D 1000 UNITS PO TABS   Oral   Take 1,000 Units by mouth 2 (two) times daily.          Marland Kitchen MOXIFLOXACIN HCL 0.5 % OP SOLN   Both Eyes   Place 1 drop into both eyes 2 (two) times daily.         Marland Kitchen ROSUVASTATIN CALCIUM 10 MG PO TABS   Oral   Take 10 mg by mouth Nightly.          . SERTRALINE HCL 100 MG PO TABS   Oral   Take 250 mg by mouth daily.          . TRAZODONE HCL 50 MG PO TABS   Oral   Take 50-100 mg by mouth at bedtime.            BP 189/80  Pulse 71  Temp 99.1 F (37.3 C) (Oral)  Resp 18  SpO2 98%  Physical Exam  Nursing note and vitals reviewed. Constitutional: She is oriented to person, place, and time. She appears well-developed and well-nourished. She appears distressed (patient  reports pain is 6/10).       . Patient noted be hypertensive  HENT:  Head: Normocephalic and atraumatic.  Nose: Nose normal.  Mouth/Throat: Oropharynx is clear and moist.  Eyes: Conjunctivae normal and EOM are normal. Pupils are equal, round, and reactive to light.  Neck: Normal range of motion. Neck supple. No JVD present. No tracheal deviation present. No thyromegaly present.  Cardiovascular: Normal rate, regular rhythm, normal heart sounds and intact distal pulses.  Exam reveals no gallop and no friction rub.   No murmur heard. Pulmonary/Chest: Effort normal and breath sounds normal. No stridor. No respiratory distress. She has no wheezes. She has no rales. She exhibits no tenderness.  Abdominal: Soft. Bowel sounds are normal. She exhibits no distension and no mass. There is no tenderness. There is no rebound and no guarding.  Musculoskeletal: Normal range of motion. She exhibits no edema and no tenderness.  Lymphadenopathy:    She has no cervical adenopathy.  Neurological: She is alert and oriented to person, place, and time. She exhibits normal muscle tone. Coordination normal.  Skin: Skin is warm and dry. No rash noted. No erythema. No pallor.  Psychiatric: She has a normal mood and affect. Her behavior is normal. Judgment and thought content normal.    ED Course  Procedures (including critical care time)  Labs Reviewed  CBC - Abnormal; Notable for the following:    RBC 5.30 (*)     MCH 25.7 (*)     All other components within normal limits  COMPREHENSIVE METABOLIC PANEL - Abnormal; Notable for the following:    Glucose, Bld 112 (*)     GFR calc non Af Amer 56 (*)     GFR calc Af Amer 64 (*)     All other components within normal limits  URINALYSIS, ROUTINE W REFLEX MICROSCOPIC - Abnormal; Notable for the following:    APPearance HAZY (*)     Hgb urine dipstick LARGE (*)     All other components within normal limits  URINE MICROSCOPIC-ADD ON - Abnormal; Notable for the  following:    Crystals CA OXALATE CRYSTALS (*)     All other components within normal limits  Ct Abdomen Pelvis Wo Contrast  03/30/2012  *RADIOLOGY REPORT*  Clinical Data: Right flank pain and hematuria.  CT ABDOMEN AND PELVIS WITHOUT CONTRAST  Technique:  Multidetector CT imaging of the abdomen and pelvis was performed following the standard protocol without intravenous contrast.  Comparison: None.  Findings: Minimal focal area of infiltration in the right lung base medially.  Mild anterior pericardial thickening or fluid.  Small esophageal hiatal hernia.  There is right-sided pyelocaliectasis and ureterectasis with a 3 mm stone in the distal right ureter just above the ureterovesicle junction.  There is periureteral stranding and pararenal stranding. Changes are consistent with moderate obstruction.  The no additional renal or ureteral stones are identified.  The left ureter is decompressed.  The bladder is decompressed.  The unenhanced appearance of the liver, spleen, gallbladder, pancreas, adrenal glands, and retroperitoneal lymph nodes is unremarkable.  Calcification of the abdominal aorta without aneurysm.  The stomach, small bowel, and colon are not abnormally distended.  No free air or free fluid in the abdomen.  Pelvis:  The appendix is not specifically identified.  No free or loculated pelvic fluid collections.  Surgical absence of the uterus.  No significant pelvic lymphadenopathy.  No diverticulitis. Degenerative changes throughout the lumbar spine.  IMPRESSION: 3 mm stone in the distal right ureter with moderate proximal obstruction.   Original Report Authenticated By: Burman Nieves, M.D.     Date: 03/29/2012  Rate: 79  Rhythm: normal sinus rhythm  QRS Axis: normal  Intervals: normal  ST/T Wave abnormalities: normal  Conduction Disutrbances:none  Narrative Interpretation:   Old EKG Reviewed: none available    1. Kidney stone on right side       MDM  66 year old female with  right flank pain along with hematuria and calcium oxalate crystals. Suspect kidney stone. EKG unremarkable, lab work normal aside from urine findings. We'll get CT abdomen pelvis without contrast to further evaluate.   Scan shows 3 mm stone.  Do not feel jaw pain, n/v are cardiac related, most likely due to flank pain.  Pt feeling much improved, to receive second dose of dilaudid and expect d/c home.       Olivia Mackie, MD 03/30/12 0454  Olivia Mackie, MD 03/30/12 [phone]

## 2012-03-29 NOTE — ED Notes (Signed)
Pt presents with right flank pain that began tonight, as pain continued pt began having jaw pain and nausea.  Pt had similar pain at doctors office on Monday and was told to follow up if pain continued.  Pt denies any difficulty or pain with urination.  Pt in NAD at this time.

## 2012-03-29 NOTE — Progress Notes (Signed)
Quick Note:  Please call pt and advise that the following labs are abnormal...  All labs look very good except Vitamin D level is below normal; get an OTC Vitamin D 2000 IU supplement and take 1 capsule daily.Try to get 10-15 minutes of sun exposure most days of the week and try to eat more foods that are rich in Vitamin D (salmon and tuna, mushrooms, Vit-D fortified dairy products).   Continue all current medications.  Copy to pt. ______

## 2012-03-30 ENCOUNTER — Emergency Department (HOSPITAL_COMMUNITY): Payer: 59

## 2012-03-30 MED ORDER — HYDROMORPHONE HCL PF 1 MG/ML IJ SOLN
0.5000 mg | Freq: Once | INTRAMUSCULAR | Status: AC
  Administered 2012-03-30: 0.5 mg via INTRAVENOUS
  Filled 2012-03-30: qty 1

## 2012-03-30 MED ORDER — OXYCODONE-ACETAMINOPHEN 5-325 MG PO TABS
2.0000 | ORAL_TABLET | ORAL | Status: DC | PRN
Start: 2012-03-30 — End: 2012-08-16

## 2012-03-30 MED ORDER — ONDANSETRON HCL 4 MG PO TABS: 4.0000 mg | ORAL_TABLET | Freq: Four times a day (QID) | ORAL | Status: DC

## 2012-03-30 NOTE — ED Notes (Signed)
Patient transported to CT 

## 2012-04-06 ENCOUNTER — Other Ambulatory Visit: Payer: Self-pay | Admitting: Emergency Medicine

## 2012-04-11 ENCOUNTER — Ambulatory Visit: Payer: 59

## 2012-04-11 ENCOUNTER — Other Ambulatory Visit: Payer: 59

## 2012-05-07 ENCOUNTER — Other Ambulatory Visit: Payer: Self-pay | Admitting: Emergency Medicine

## 2012-05-17 ENCOUNTER — Ambulatory Visit
Admission: RE | Admit: 2012-05-17 | Discharge: 2012-05-17 | Disposition: A | Discharge status: Home / Self Care | Payer: 59 | Source: Ambulatory Visit | Attending: Emergency Medicine | Admitting: Emergency Medicine

## 2012-05-17 DIAGNOSIS — M810 Age-related osteoporosis without current pathological fracture: Secondary | ICD-10-CM

## 2012-05-17 DIAGNOSIS — Z1231 Encounter for screening mammogram for malignant neoplasm of breast: Secondary | ICD-10-CM

## 2012-05-21 ENCOUNTER — Ambulatory Visit (INDEPENDENT_AMBULATORY_CARE_PROVIDER_SITE_OTHER): Payer: 59 | Admitting: Family Medicine

## 2012-05-21 VITALS — BP 144/73 | HR 67 | Temp 98.0°F | Resp 17 | Ht 66.0 in | Wt 157.0 lb

## 2012-05-21 DIAGNOSIS — J029 Acute pharyngitis, unspecified: Secondary | ICD-10-CM

## 2012-05-21 DIAGNOSIS — R059 Cough, unspecified: Secondary | ICD-10-CM

## 2012-05-21 DIAGNOSIS — R05 Cough: Secondary | ICD-10-CM

## 2012-05-21 DIAGNOSIS — J209 Acute bronchitis, unspecified: Secondary | ICD-10-CM

## 2012-05-21 MED ORDER — BENZONATATE 100 MG PO CAPS: 200.0000 mg | ORAL_CAPSULE | Freq: Two times a day (BID) | ORAL | Status: AC | PRN

## 2012-05-21 MED ORDER — IPRATROPIUM BROMIDE 0.03 % NA SOLN: 2.0000 | Freq: Two times a day (BID) | NASAL | Status: DC

## 2012-05-21 NOTE — Patient Instructions (Signed)

## 2012-05-21 NOTE — Progress Notes (Signed)
[facility]:  Office Visit  Chief Complaint:  Chief Complaint  Patient presents with  . Nasal Congestion  . Cough  . Sore Throat  . Fatigue    HPI: Monica Kane is a 67 y.o. female who complains of  A 4 day history of sore throat, dry cough, eyes burning and itching, nausea, and  fatigue.  Has tried Copy. Friday had  Zinc lozenges with minimal relief. Denies facial pressure. Ears stopped up. Denies coughing up anything. Hurts on her right chest when she coughs. Denies fevers/chills.  Husband has bronchitis and pneumonia. Nonsmoker. No history of asthma, allergies. Doing a ballroom competition in 2 weeks and really wants to get better, she would like an antibiotic.  Past Medical History  Diagnosis Date  . Hypertension   . Depression   . Migraine   . Hyperlipidemia   . Cataract    Past Surgical History  Procedure Date  . Abdominal hysterectomy 1989  . Lateral release right elbow 2010  . Shoulder arthroscopy w/ rotator cuff repair 2006  . Cervical spine surgery 2004    C 5-6  . Parotid gland tumor excision 1976    benign  . Diagnostic laparoscopy 1976    fibroid removed  . Appendectomy   . Colonoscopy   . Cervical fusion 5/12  . Shoulder arthroscopy distal clavicle excision and open rotator cuff repair 2006  . Elbow arthroscopy 11/10  . Parotid gland tumor excision 1976    rt  . Lateral epicondyle release 03/01/2011    Procedure: TENNIS ELBOW RELEASE;  Surgeon: Nilda Simmer, MD;  Location: [facility];  Service: Orthopedics;  Laterality: Left;  TENOTOMY ELBOW LATERAL EPICONDYLITIS TENNIS ELBOW  . Eye surgery    History   Social History  . Marital Status: Married    Spouse Name: N/A    Number of Children: N/A  . Years of Education: N/A   Social History Main Topics  . Smoking status: Never Smoker   . Smokeless tobacco: None  . Alcohol Use: 0.0 oz/week    2-3 Glasses of wine per week  . Drug Use: No  .  Sexually Active: Yes   Other Topics Concern  . None   Social History Narrative  . None   Family History  Problem Relation Age of Onset  . Cancer Mother   . Cancer Father   . Heart disease Maternal Grandfather     heart attack  . Heart disease Paternal Grandfather     heart attack   Allergies  Allergen Reactions  . Penicillins Swelling and Rash   Prior to Admission medications   Medication Sig Start Date End Date Taking? Authorizing Provider  amitriptyline (ELAVIL) 25 MG tablet Take 1 tablet (25 mg total) by mouth at bedtime. 03/28/12 03/28/13 Yes Maurice March, MD  carvedilol (COREG) 3.125 MG tablet TAKE 1 TABLET TWICE DAILY. 05/07/12  Yes Ryan M Dunn, PA-C  cholecalciferol (VITAMIN D) 1000 UNITS tablet Take 1,000 Units by mouth 2 (two) times daily.    Yes Historical Provider, MD  CRESTOR 10 MG tablet TAKE 1 TABLET DAILY. 04/06/12  Yes Ryan M Dunn, PA-C  sertraline (ZOLOFT) 100 MG tablet Take 250 mg by mouth daily.    Yes Historical Provider, MD  traZODone (DESYREL) 50 MG tablet Take 50-100 mg by mouth at bedtime.    Yes Historical Provider, MD  moxifloxacin (VIGAMOX) 0.5 % ophthalmic solution Place 1 drop into both eyes 2 (two)  times daily.    Historical Provider, MD  ondansetron (ZOFRAN) 4 MG tablet Take 1 tablet (4 mg total) by mouth every 6 (six) hours. 03/30/12   Olivia Mackie, MD  oxyCODONE-acetaminophen (PERCOCET/ROXICET) 5-325 MG per tablet Take 2 tablets by mouth every 4 (four) hours as needed for pain. 03/30/12   Olivia Mackie, MD     ROS: The patient denies fevers, chills, night sweats, unintentional weight loss, chest pain, palpitations, wheezing, dyspnea on exertion, nausea, vomiting, abdominal pain, dysuria, hematuria, melena, numbness, weakness, or tingling.  All other systems have been reviewed and were otherwise negative with the exception of those mentioned in the HPI and as above.    PHYSICAL EXAM: Filed Vitals:   05/21/12 1619  BP: 144/73  Pulse: 67   Temp: 98 F (36.7 C)  Resp: 17   Filed Vitals:   05/21/12 1619  Height: 5\' 6"  (1.676 m)  Weight: 157 lb (71.215 kg)   Body mass index is 25.34 kg/(m^2).  General: Alert, no acute distress HEENT:  Normocephalic, atraumatic, oropharynx patent. TM nl. No sinus tenderness. EOMI, PERRLA Cardiovascular:  Regular rate and rhythm, no rubs murmurs or gallops.  No Carotid bruits, radial pulse intact. No pedal edema.  Respiratory: Clear to auscultation bilaterally.  No wheezes, rales, or rhonchi.  No cyanosis, no use of accessory musculature GI: No organomegaly, abdomen is soft and non-tender, positive bowel sounds.  No masses. Skin: No rashes. Neurologic: Facial musculature symmetric. Psychiatric: Patient is appropriate throughout our interaction. Lymphatic: No cervical lymphadenopathy Musculoskeletal: Gait intact.   LABS: Results for orders placed during the hospital encounter of 03/29/12  CBC      Component Value Range   WBC 6.9  4.0 - 10.5 K/uL   RBC 5.30 (*) 3.87 - 5.11 MIL/uL   Hemoglobin 13.6  12.0 - 15.0 g/dL   HCT 16.1  09.6 - 04.5 %   MCV 78.5  78.0 - 100.0 fL   MCH 25.7 (*) 26.0 - 34.0 pg   MCHC 32.7  30.0 - 36.0 g/dL   RDW 40.9  81.1 - 91.4 %   Platelets 209  150 - 400 K/uL  COMPREHENSIVE METABOLIC PANEL      Component Value Range   Sodium 143  135 - 145 mEq/L   Potassium 3.9  3.5 - 5.1 mEq/L   Chloride 105  96 - 112 mEq/L   CO2 24  19 - 32 mEq/L   Glucose, Bld 112 (*) 70 - 99 mg/dL   BUN 18  6 - 23 mg/dL   Creatinine, Ser 7.82  0.50 - 1.10 mg/dL   Calcium 95.6  8.4 - 21.3 mg/dL   Total Protein 7.1  6.0 - 8.3 g/dL   Albumin 4.2  3.5 - 5.2 g/dL   AST 20  0 - 37 U/L   ALT 19  0 - 35 U/L   Alkaline Phosphatase 88  39 - 117 U/L   Total Bilirubin 0.3  0.3 - 1.2 mg/dL   GFR calc non Af Amer 56 (*) >90 mL/min   GFR calc Af Amer 64 (*) >90 mL/min  URINALYSIS, ROUTINE W REFLEX MICROSCOPIC      Component Value Range   Color, Urine YELLOW  YELLOW   APPearance HAZY (*)  CLEAR   Specific Gravity, Urine 1.023  1.005 - 1.030   pH 5.5  5.0 - 8.0   Glucose, UA NEGATIVE  NEGATIVE mg/dL   Hgb urine dipstick LARGE (*) NEGATIVE   Bilirubin Urine  NEGATIVE  NEGATIVE   Ketones, ur NEGATIVE  NEGATIVE mg/dL   Protein, ur NEGATIVE  NEGATIVE mg/dL   Urobilinogen, UA 0.2  0.0 - 1.0 mg/dL   Nitrite NEGATIVE  NEGATIVE   Leukocytes, UA NEGATIVE  NEGATIVE  URINE MICROSCOPIC-ADD ON      Component Value Range   Squamous Epithelial / LPF RARE  RARE   WBC, UA 0-2  <3 WBC/hpf   RBC / HPF 21-50  <3 RBC/hpf   Bacteria, UA RARE  RARE   Crystals CA OXALATE CRYSTALS (*) NEGATIVE     EKG/XRAY:   Primary read interpreted by Dr. Conley Rolls at [facility].   ASSESSMENT/PLAN: Encounter Diagnoses  Name Primary?  . Acute bronchitis Yes  . Cough   . Pharyngitis    Rx Tessalon Perles, Hydromet syrup Z pack to take if no improvement in 3-4 days. D/w patient the risks of abx use F/u prn    Armiyah Capron PHUONG, DO 05/21/2012 5:03 PM

## 2012-08-16 ENCOUNTER — Ambulatory Visit (INDEPENDENT_AMBULATORY_CARE_PROVIDER_SITE_OTHER): Payer: 59 | Admitting: Physician Assistant

## 2012-08-16 VITALS — BP 159/75 | HR 79 | Temp 98.4°F | Resp 16 | Ht 65.25 in | Wt 150.0 lb

## 2012-08-16 DIAGNOSIS — J019 Acute sinusitis, unspecified: Secondary | ICD-10-CM

## 2012-08-16 MED ORDER — PREDNISONE 20 MG PO TABS: ORAL_TABLET | ORAL | Status: DC

## 2012-08-16 MED ORDER — GUAIFENESIN ER 1200 MG PO TB12: 1.0000 | ORAL_TABLET | Freq: Two times a day (BID) | ORAL | Status: DC | PRN

## 2012-08-16 MED ORDER — AZITHROMYCIN 250 MG PO TABS: ORAL_TABLET | ORAL | Status: DC

## 2012-08-16 NOTE — Progress Notes (Signed)
Subjective:    Patient ID: Monica Kane, female    DOB: 31-Dec-1945, 67 y.o.   MRN: [id number]  HPI This 67 y.o. female presents for evaluation of URI. Symptoms began 10 days ago with Sore throat, which she initially attributed to Allergies. Symptoms then became much worse much worse and include: HA Chills Coughing (mild), but worse with activity Very congested nasal/sinuses  She has a ballroom dancing competition this weekend.   Past Medical History  Diagnosis Date  . Hypertension   . Depression   . Migraine   . Hyperlipidemia   . Cataract   . Chronic kidney disease     Past Surgical History  Procedure Laterality Date  . Abdominal hysterectomy  1989  . Lateral release right elbow  2010  . Shoulder arthroscopy w/ rotator cuff repair  2006  . Cervical spine surgery  2004    C 5-6  . Parotid gland tumor excision  1976    benign  . Diagnostic laparoscopy  1976    fibroid removed  . Appendectomy    . Colonoscopy    . Cervical fusion  5/12  . Shoulder arthroscopy distal clavicle excision and open rotator cuff repair  2006  . Elbow arthroscopy  11/10  . Parotid gland tumor excision  1976    rt  . Lateral epicondyle release  03/01/2011    Procedure: TENNIS ELBOW RELEASE;  Surgeon: Nilda Simmer, MD;  Location: [facility];  Service: Orthopedics;  Laterality: Left;  TENOTOMY ELBOW LATERAL EPICONDYLITIS TENNIS ELBOW  . Eye surgery      Prior to Admission medications   Medication Sig Start Date End Date Taking? Authorizing Provider  amitriptyline (ELAVIL) 25 MG tablet Take 1 tablet (25 mg total) by mouth at bedtime. 03/28/12 03/28/13 Yes Maurice March, MD  carvedilol (COREG) 3.125 MG tablet TAKE 1 TABLET TWICE DAILY. 05/07/12  Yes Ryan M Dunn, PA-C  cholecalciferol (VITAMIN D) 1000 UNITS tablet Take 1,000 Units by mouth 2 (two) times daily.    Yes Historical Provider, MD  CRESTOR 10 MG tablet TAKE 1 TABLET DAILY. 04/06/12  Yes Ryan M Dunn, PA-C   sertraline (ZOLOFT) 100 MG tablet Take 250 mg by mouth daily.    Yes Historical Provider, MD  traZODone (DESYREL) 50 MG tablet Take 50-100 mg by mouth at bedtime.    Yes Historical Provider, MD  ipratropium (ATROVENT) 0.03 % nasal spray Place 2 sprays into the nose every 12 (twelve) hours. 05/21/12   Thao P Le, DO    Allergies  Allergen Reactions  . Penicillins Swelling and Rash    History   Social History  . Marital Status: Married    Spouse Name: Torian Thoennes   Social History Main Topics  . Smoking status: Never Smoker   . Smokeless tobacco: Never Used  . Alcohol Use: 0.0 oz/week    2-3 Glasses of wine per week  . Drug Use: No  . Sexually Active: Yes   Social History Narrative   Lives with her husband.  He has 2 adult children.    Family History  Problem Relation Age of Onset  . Cancer Mother     lung  . Cancer Father     lung; + tobacco  . Heart disease Maternal Grandfather     heart attack  . Heart disease Paternal Grandfather     heart attack  . Diabetes Sister   . Hypertension Sister   . Hyperlipidemia Sister  Review of Systems As above.  No GI/GU symptoms.    Objective:   Physical Exam  Blood pressure 159/75, pulse 79, temperature 98.4 F (36.9 C), temperature source Oral, resp. rate 16, height 5' 5.25" (1.657 m), weight 150 lb (68.04 kg), SpO2 95.00%. Body mass index is 24.78 kg/(m^2). Well-developed, well nourished WF who is awake, alert and oriented, in NAD. HEENT: [place]/AT, PERRL, EOMI.  Sclera and conjunctiva are clear.  EAC are patent, TMs are normal in appearance. Nasal mucosa is congested, pink and moist. OP is clear. Neck: supple, non-tender, no lymphadenopathy, thyromegaly. Heart: RRR, no murmur Lungs: normal effort, CTA Extremities: no cyanosis, clubbing or edema. Skin: warm and dry without rash. Psychologic: good mood and appropriate affect, normal speech and behavior.     Assessment & Plan:  Sinusitis, acute - Plan: azithromycin  (ZITHROMAX) 250 MG tablet, Guaifenesin (MUCINEX MAXIMUM STRENGTH) 1200 MG TB12, predniSONE (DELTASONE) 20 MG tablet  Supportive care.  Anticipatory guidance.  RTC if symptoms worsen/persist.  Fernande Bras, PA-C Certified Physician Assistant  Medical Group/[facility]    Fernande Bras, PA-C Physician Assistant-Certified [facility]

## 2012-08-16 NOTE — Patient Instructions (Signed)
Restart the Atrovent nasal spray that you have at home from last time.  Get plenty of rest and drink at least 64 ounces of water daily.

## 2012-11-07 ENCOUNTER — Other Ambulatory Visit: Payer: Self-pay | Admitting: Family Medicine

## 2012-11-21 ENCOUNTER — Other Ambulatory Visit: Payer: Self-pay | Admitting: Orthopedic Surgery

## 2012-11-21 DIAGNOSIS — M7121 Synovial cyst of popliteal space [Baker], right knee: Secondary | ICD-10-CM

## 2012-11-28 ENCOUNTER — Ambulatory Visit
Admission: RE | Admit: 2012-11-28 | Discharge: 2012-11-28 | Disposition: A | Discharge status: Home / Self Care | Payer: 59 | Source: Ambulatory Visit | Attending: Orthopedic Surgery | Admitting: Orthopedic Surgery

## 2012-11-28 DIAGNOSIS — M7121 Synovial cyst of popliteal space [Baker], right knee: Secondary | ICD-10-CM

## 2012-12-05 ENCOUNTER — Other Ambulatory Visit: Payer: Self-pay | Admitting: Physician Assistant

## 2013-01-15 ENCOUNTER — Other Ambulatory Visit: Payer: Self-pay | Admitting: Family Medicine

## 2013-01-16 ENCOUNTER — Other Ambulatory Visit: Payer: Self-pay

## 2013-01-16 MED ORDER — AMITRIPTYLINE HCL 25 MG PO TABS: 25.0000 mg | ORAL_TABLET | Freq: Every day | ORAL | Status: DC

## 2013-01-17 HISTORY — PX: KNEE ARTHROSCOPY: SUR90

## 2013-01-22 ENCOUNTER — Other Ambulatory Visit: Payer: Self-pay

## 2013-01-22 DIAGNOSIS — Z1231 Encounter for screening mammogram for malignant neoplasm of breast: Secondary | ICD-10-CM

## 2013-01-31 HISTORY — PX: KNEE ARTHROSCOPY W/ MENISCECTOMY: SHX1879

## 2013-02-20 ENCOUNTER — Encounter: Payer: Self-pay | Admitting: Emergency Medicine

## 2013-02-20 ENCOUNTER — Ambulatory Visit (INDEPENDENT_AMBULATORY_CARE_PROVIDER_SITE_OTHER): Payer: 59 | Admitting: Emergency Medicine

## 2013-02-20 VITALS — BP 124/70 | HR 61 | Temp 98.6°F | Resp 16 | Ht 65.5 in | Wt 152.8 lb

## 2013-02-20 DIAGNOSIS — R8281 Pyuria: Secondary | ICD-10-CM

## 2013-02-20 DIAGNOSIS — R350 Frequency of micturition: Secondary | ICD-10-CM

## 2013-02-20 DIAGNOSIS — R21 Rash and other nonspecific skin eruption: Secondary | ICD-10-CM

## 2013-02-20 DIAGNOSIS — R82998 Other abnormal findings in urine: Secondary | ICD-10-CM

## 2013-02-20 LAB — POCT UA - MICROSCOPIC ONLY: Yeast, UA: NEGATIVE

## 2013-02-20 LAB — POCT URINALYSIS DIPSTICK
Glucose, UA: NEGATIVE
Spec Grav, UA: >=1.03
Urobilinogen, UA: 0.2

## 2013-02-20 MED ORDER — AMITRIPTYLINE HCL 25 MG PO TABS: ORAL_TABLET | ORAL | Status: DC

## 2013-02-20 MED ORDER — CIPROFLOXACIN HCL 250 MG PO TABS: ORAL_TABLET | ORAL | Status: DC

## 2013-02-20 MED ORDER — TRIAMCINOLONE 0.1 % CREAM:EUCERIN CREAM 1:1: 1.0000 "application " | TOPICAL_CREAM | Freq: Three times a day (TID) | CUTANEOUS | Status: DC

## 2013-02-20 NOTE — Progress Notes (Signed)
  Subjective:    Patient ID: Monica Kane, female    DOB: 1946-02-07, 67 y.o.   MRN: [id number]  HPI patient is with a one-week history of urinary frequency and burning. She does have a history of a previous kidney stone last year. She also needs a refill on her Elavil. She has some questions about a rash on her lower extremities.    Review of Systems     Objective:   Physical Exam patient is alert and cooperative she is not in any distress. Chest was clear heart regular rate no murmurs abdomen soft nontender there is no CVA pain. Extremity exam reveals varicose veins with stasis changes of the lower extremities  Results for orders placed in visit on 02/20/13  POCT URINALYSIS DIPSTICK      Result Value Range   Color, UA yellow     Clarity, UA cloudy     Glucose, UA neg     Bilirubin, UA neg     Ketones, UA trace     Spec Grav, UA >=1.030     Blood, UA large     pH, UA 6.0     Protein, UA trace     Urobilinogen, UA 0.2     Nitrite, UA neg     Leukocytes, UA moderate (2+)    POCT UA - MICROSCOPIC ONLY      Result Value Range   WBC, Ur, HPF, POC tntc     RBC, urine, microscopic 1-6     Bacteria, U Microscopic 1+     Mucus, UA small     Epithelial cells, urine per micros 0-15     Crystals, Ur, HPF, POC neg     Casts, Ur, LPF, POC neg     Yeast, UA neg          Assessment & Plan:   Patient advised of her abnormal urine. She was told of the possibility of a kidney stone. Will treat with cipro. Her amitriptyline was refilled. I also gave her some Eucerin and triamcinolone mixed to use for her stasis dermatitis .

## 2013-02-22 ENCOUNTER — Other Ambulatory Visit: Payer: Self-pay

## 2013-02-22 LAB — URINE CULTURE: Colony Count: >=100000

## 2013-03-23 ENCOUNTER — Encounter: Payer: Self-pay | Admitting: Family Medicine

## 2013-03-23 ENCOUNTER — Ambulatory Visit (INDEPENDENT_AMBULATORY_CARE_PROVIDER_SITE_OTHER): Payer: 59 | Admitting: Family Medicine

## 2013-03-23 VITALS — BP 140/78 | HR 53 | Temp 97.9°F | Resp 16 | Ht 65.25 in | Wt 154.0 lb

## 2013-03-23 DIAGNOSIS — M8580 Other specified disorders of bone density and structure, unspecified site: Secondary | ICD-10-CM

## 2013-03-23 DIAGNOSIS — E559 Vitamin D deficiency, unspecified: Secondary | ICD-10-CM

## 2013-03-23 DIAGNOSIS — F329 Major depressive disorder, single episode, unspecified: Secondary | ICD-10-CM

## 2013-03-23 DIAGNOSIS — M1990 Unspecified osteoarthritis, unspecified site: Secondary | ICD-10-CM

## 2013-03-23 DIAGNOSIS — R739 Hyperglycemia, unspecified: Secondary | ICD-10-CM

## 2013-03-23 DIAGNOSIS — I10 Essential (primary) hypertension: Secondary | ICD-10-CM

## 2013-03-23 DIAGNOSIS — E785 Hyperlipidemia, unspecified: Secondary | ICD-10-CM

## 2013-03-23 DIAGNOSIS — N200 Calculus of kidney: Secondary | ICD-10-CM

## 2013-03-23 DIAGNOSIS — Z0000 Encounter for general adult medical examination without abnormal findings: Secondary | ICD-10-CM

## 2013-03-23 DIAGNOSIS — F32A Depression, unspecified: Secondary | ICD-10-CM

## 2013-03-23 DIAGNOSIS — Z23 Encounter for immunization: Secondary | ICD-10-CM

## 2013-03-23 LAB — POCT UA - MICROSCOPIC ONLY: Yeast, UA: NEGATIVE

## 2013-03-23 LAB — COMPREHENSIVE METABOLIC PANEL
Alkaline Phosphatase: 71 U/L (ref 39–117)
BUN: 21 mg/dL (ref 6–23)
CO2: 29 mEq/L (ref 19–32)
Calcium: 9.6 mg/dL (ref 8.4–10.5)
Chloride: 104 mEq/L (ref 96–112)
Creat: 0.81 mg/dL (ref 0.50–1.10)
Glucose, Bld: 94 mg/dL (ref 70–99)
Sodium: 140 mEq/L (ref 135–145)
Total Bilirubin: 0.5 mg/dL (ref 0.3–1.2)

## 2013-03-23 LAB — LIPID PANEL
Cholesterol: 162 mg/dL (ref 0–200)
HDL: 66 mg/dL (ref >39)
Total CHOL/HDL Ratio: 2.5 Ratio
Triglycerides: 67 mg/dL (ref <150)
VLDL: 13 mg/dL (ref 0–40)

## 2013-03-23 LAB — CBC WITH DIFFERENTIAL/PLATELET
Basophils Relative: 1 % (ref 0–1)
Eosinophils Absolute: 0.1 10*3/uL (ref 0.0–0.7)
Eosinophils Relative: 2 % (ref 0–5)
Hemoglobin: 12.7 g/dL (ref 12.0–15.0)
Lymphs Abs: 1.3 10*3/uL (ref 0.7–4.0)
MCH: 25.9 pg — ABNORMAL LOW (ref 26.0–34.0)
MCHC: 33.8 g/dL (ref 30.0–36.0)
MCV: 76.7 fL — ABNORMAL LOW (ref 78.0–100.0)
Monocytes Relative: 7 % (ref 3–12)
Neutrophils Relative %: 58 % (ref 43–77)
Platelets: 189 10*3/uL (ref 150–400)
RBC: 4.9 MIL/uL (ref 3.87–5.11)
RDW: 13.8 % (ref 11.5–15.5)

## 2013-03-23 LAB — POCT URINALYSIS DIPSTICK
Glucose, UA: NEGATIVE
Nitrite, UA: NEGATIVE
pH, UA: 5.5

## 2013-03-23 LAB — POCT GLYCOSYLATED HEMOGLOBIN (HGB A1C): Hemoglobin A1C: 5.6

## 2013-03-23 LAB — TSH: TSH: 3.08 u[IU]/mL (ref 0.350–4.500)

## 2013-03-23 LAB — HEPATITIS C ANTIBODY: HCV Ab: NEGATIVE

## 2013-03-23 MED ORDER — AMITRIPTYLINE HCL 25 MG PO TABS: ORAL_TABLET | ORAL | Status: DC

## 2013-03-23 MED ORDER — ZOSTER VACCINE LIVE 19400 UNT/0.65ML ~~LOC~~ SOLR: 0.6500 mL | Freq: Once | SUBCUTANEOUS | Status: DC

## 2013-03-23 MED ORDER — ROSUVASTATIN CALCIUM 10 MG PO TABS: 10.0000 mg | ORAL_TABLET | Freq: Every day | ORAL | Status: DC

## 2013-03-23 MED ORDER — CARVEDILOL 3.125 MG PO TABS: 3.1250 mg | ORAL_TABLET | Freq: Two times a day (BID) | ORAL | Status: DC

## 2013-03-23 NOTE — Patient Instructions (Signed)
Start checking your blood pressure twice a week and pulse outside of the office. If it is running >140s more than half of the time we should discuss either increasing your carvedilol (though we are limited by your low resting pulse/heart rate) or add in a second blood pressure medication.  Please email in through MyChart with a report of your blood pressures in about a month.  We will check your vitamin D level to ensure it normal - it will be very important for you to maintain an excellent vitamin D test considering your risk of having a fracture in the next 10 years was so high on your last bone test.  It will also be very important to recheck you bone test in 1 year (Jan 2016) and if it is worse at all we will then likely start you on an osteoporosis medication like fosamax.  Get your shingles vaccine next time you are at the pharmacy.  When you have your colonoscopy done, please request that a copy of the report be sent to [place].  For you sleep study, you can follow-up with whomever you prefer but I have had really great experiences with the neurologist at [facility] like Dr. Zipporah Plants - they seem to do a really comprehensive review of sleep and sleep apnea.  I recommend starting to use a humidifier in your bed room every night and then use nasal saline spray frequently throughout the day.   Keeping You Healthy  Get These Tests  Blood Pressure- Have your blood pressure checked by your healthcare provider at least once a year.  Normal blood pressure is 120/80.  Weight- Have your body mass index (BMI) calculated to screen for obesity.  BMI is a measure of body fat based on height and weight.  You can calculate your own BMI at https://www.west-esparza.com/  Cholesterol- Have your cholesterol checked every year.  Diabetes- Have your blood sugar checked every year if you have high blood pressure, high cholesterol, a family history of diabetes or if you are  overweight.  Pap Smear- Have a pap smear every 1 to 3 years if you have been sexually active.  If you are older than 65 and recent pap smears have been normal you may not need additional pap smears.  In addition, if you have had a hysterectomy  For benign disease additional pap smears are not necessary.  Mammogram-Yearly mammograms are essential for early detection of breast cancer  Screening for Colon Cancer- Colonoscopy starting at age 36. Screening may begin sooner depending on your family history and other health conditions.  Follow up colonoscopy as directed by your Gastroenterologist.  Screening for Osteoporosis- Screening begins at age 40 with bone density scanning, sooner if you are at higher risk for developing Osteoporosis.  Get these medicines  Calcium with Vitamin D- Your body requires 1200-1500 mg of Calcium a day and [phone] IU of Vitamin D a day.  You can only absorb 500 mg of Calcium at a time therefore Calcium must be taken in 2 or 3 separate doses throughout the day.  Hormones- Hormone therapy has been associated with increased risk for certain cancers and heart disease.  Talk to your healthcare provider about if you need relief from menopausal symptoms.  Aspirin- Ask your healthcare provider about taking Aspirin to prevent Heart Disease and Stroke.  Get these Immuniztions  Flu shot- Every fall  Pneumonia shot- Once after the age of 83; if you are younger ask your healthcare  provider if you need a pneumonia shot.  Tetanus- Every ten years.  Zostavax- Once after the age of 47 to prevent shingles.  Take these steps  Don't smoke- Your healthcare provider can help you quit. For tips on how to quit, ask your healthcare provider or go to www.smokefree.gov or call 1-800 QUIT-NOW.  Be physically active- Exercise 5 days a week for a minimum of 30 minutes.  If you are not already physically active, start slow and gradually work up to 30 minutes of moderate physical activity.   Try walking, dancing, bike riding, swimming, etc.  Eat a healthy diet- Eat a variety of healthy foods such as fruits, vegetables, whole grains, low fat milk, low fat cheeses, yogurt, lean meats, chicken, fish, eggs, dried beans, tofu, etc.  For more information go to www.thenutritionsource.org  Dental visit- Brush and floss teeth twice daily; visit your dentist twice a year.  Eye exam- Visit your Optometrist or Ophthalmologist yearly.  Drink alcohol in moderation- Limit alcohol intake to one drink or less a day.  Never drink and drive.  Depression- Your emotional health is as important as your physical health.  If you're feeling down or losing interest in things you normally enjoy, please talk to your healthcare provider.  Seat Belts- can save your life; always wear one  Smoke/Carbon Monoxide detectors- These detectors need to be installed on the appropriate level of your home.  Replace batteries at least once a year.  Violence- If anyone is threatening or hurting you, please tell your healthcare provider.  Living Will/ Health care power of attorney- Discuss with your healthcare provider and family.

## 2013-03-23 NOTE — Progress Notes (Signed)
   Subjective:    Patient ID: Monica Kane, female    DOB: 1945/10/01, 68 y.o.   MRN: [id number]  HPI    Review of Systems  Constitutional: Positive for fatigue.  HENT: Positive for nosebleeds.   Eyes: Positive for photophobia and redness.  Respiratory: Positive for apnea.   Cardiovascular: Positive for leg swelling.  Gastrointestinal: Negative.   Endocrine: Negative.   Genitourinary: Negative.   Musculoskeletal: Positive for arthralgias.  Skin: Negative.   Allergic/Immunologic: Negative.   Neurological: Negative.   Hematological: Negative.   Psychiatric/Behavioral: Negative.        Objective:   Physical Exam        Assessment & Plan:

## 2013-03-23 NOTE — Progress Notes (Addendum)
Subjective:    Patient ID: Monica Kane, female    DOB: 12-29-1945, 67 y.o.   MRN: [id number] Chief Complaint  Patient presents with  . Annual Exam    no pap    HPI  Trazodone is from pyschiatrist to help sleep at night - psych meds rx'ed by Dr. Eugenia Pancoast in [place].   Amitriptyline was then started to help with nerve pain in her left foot which she still has.  No GERD/GI problems - tries to stay away from to many otc nsaids to help avoid this.  Is taking 1000u vitamin D bid.  Has been taking crestor nightly - has plenty so doesn't understand why rxs expired 6 mos ago - she is compliant w/ her meds and taking as rx'ed. Not missing any doses.  Taking carvedilol bid - has BP cuff at home but does not check BP outside of office - her husband uses cuff.  Pulse does tend to run low.  Tried 2 other BP meds prior to starting on carvedilol but they causes a lot of side effects - does not remember what they were off the top of her head.  Just had a modified simple sleep study done by an orthodontist friend - was diagnosed w/ very mild - 11 - but had 8 obstructive times and 3 central apnea episodes.  He was going to send the info to a sleep doctor (she thinks maybe Dr. Magda Paganini) to see if she needs more testing or if she could use the dental device to treat.  She is concerned about the central apnea episodes.  Last PAP: 2012 (normal) - complete hystrectomy for fibroids and severe endometriosis in 1989. No problems or sxs since. Last MMG: 05/17/2012 (normal) - has scheduled again for next mo Last ECG: 2012 (normal). DEXA: 05/17/2012 - osteopenia with T score -1.7, 18% risk of major osteoporotic fracture Last Colonoscopy: 5 years ago; sch for Jan 2015 - at [facility] Tetanus 2009 Does not get flu shot. No prior pneumovax or zostavax. Had chicken pox as a child and had episode of shingles 20 yrs ago.  Past Medical History  Diagnosis Date  .  Hypertension   . Depression   . Migraine   . Hyperlipidemia   . Cataract   . Chronic kidney disease   . Anxiety    Past Surgical History  Procedure Laterality Date  . Abdominal hysterectomy  1989  . Lateral release right elbow  2010  . Shoulder arthroscopy w/ rotator cuff repair  2006  . Cervical spine surgery  2004    C 5-6  . Parotid gland tumor excision  1976    benign  . Diagnostic laparoscopy  1976    fibroid removed  . Appendectomy    . Colonoscopy    . Cervical fusion  5/12  . Shoulder arthroscopy distal clavicle excision and open rotator cuff repair  2006  . Elbow arthroscopy  11/10  . Parotid gland tumor excision  1976    rt  . Lateral epicondyle release  03/01/2011    Procedure: TENNIS ELBOW RELEASE;  Surgeon: Nilda Simmer, MD;  Location: [facility];  Service: Orthopedics;  Laterality: Left;  TENOTOMY ELBOW LATERAL EPICONDYLITIS TENNIS ELBOW  . Eye surgery     Current Outpatient Prescriptions on File Prior to Visit  Medication Sig Dispense Refill  . amitriptyline (ELAVIL) 25 MG tablet Take one at bedtime  30 tablet  11  .  carvedilol (COREG) 3.125 MG tablet TAKE 1 TABLET TWICE DAILY.  60 tablet  5  . cholecalciferol (VITAMIN D) 1000 UNITS tablet Take 1,000 Units by mouth 2 (two) times daily.       . CRESTOR 10 MG tablet TAKE 1 TABLET DAILY.  30 tablet  5  . sertraline (ZOLOFT) 100 MG tablet Take 250 mg by mouth daily.       . traZODone (DESYREL) 50 MG tablet Take 50-100 mg by mouth at bedtime.       . ciprofloxacin (CIPRO) 250 MG tablet Take one tablet twice a day  14 tablet  0  . Guaifenesin (MUCINEX MAXIMUM STRENGTH) 1200 MG TB12 Take 1 tablet (1,200 mg total) by mouth every 12 (twelve) hours as needed.  14 tablet  1  . ipratropium (ATROVENT) 0.03 % nasal spray Place 2 sprays into the nose every 12 (twelve) hours.  30 mL  0  . Triamcinolone Acetonide (TRIAMCINOLONE 0.1 % CREAM : EUCERIN) CREA Apply 1 application topically 3 (three) times daily.  1  each  0   No current facility-administered medications on file prior to visit.   Allergies  Allergen Reactions  . Penicillins Swelling and Rash   Family History  Problem Relation Age of Onset  . Cancer Mother     lung  . Cancer Father     lung; + tobacco  . Heart disease Maternal Grandfather     heart attack  . Heart disease Paternal Grandfather     heart attack  . Diabetes Sister   . Hypertension Sister   . Hyperlipidemia Sister    History   Social History  . Marital Status: Married    Spouse Name: Suda Forbess    Number of Children: N/A  . Years of Education: N/A   Social History Main Topics  . Smoking status: Never Smoker   . Smokeless tobacco: Never Used  . Alcohol Use: 0.0 oz/week    2-3 Glasses of wine per week  . Drug Use: No  . Sexual Activity: Yes   Other Topics Concern  . None   Social History Narrative   Lives with her husband.  He has 2 adult children.   Review of Systems See note above by Burna Mortimer for complete list.    BP 140/78  Pulse 53  Temp(Src) 97.9 F (36.6 C) (Oral)  Resp 16  Ht 5' 5.25" (1.657 m)  Wt 154 lb (69.854 kg)  BMI 25.44 kg/m2  SpO2 99% Objective:   Physical Exam  Constitutional: She is oriented to person, place, and time. She appears well-developed and well-nourished. No distress.  HENT:  Head: Normocephalic and atraumatic.  Right Ear: Tympanic membrane, external ear and ear canal normal.  Left Ear: Tympanic membrane, external ear and ear canal normal.  Nose: Rhinorrhea present. No mucosal edema.  Mouth/Throat: Uvula is midline, oropharynx is clear and moist and mucous membranes are normal. No posterior oropharyngeal erythema.  Small amount of maroon mucousy blood in right lateral lower nare. Nare mucosa erythematous and friable.  Eyes: Conjunctivae and EOM are normal. Pupils are equal, round, and reactive to light. Right eye exhibits no discharge. Left eye exhibits no discharge. No scleral icterus.  Neck: Normal range of  motion. Neck supple. No thyromegaly present.  Cardiovascular: Normal rate, regular rhythm, normal heart sounds and intact distal pulses.   Pulmonary/Chest: Effort normal and breath sounds normal. No respiratory distress.  Abdominal: Soft. Bowel sounds are normal. There is no tenderness.  Musculoskeletal: She exhibits  no edema.  Lymphadenopathy:    She has no cervical adenopathy.  Neurological: She is alert and oriented to person, place, and time. She has normal reflexes.  Skin: Skin is warm and dry. She is not diaphoretic. No erythema.  Psychiatric: She has a normal mood and affect. Her behavior is normal.      Results for orders placed in visit on 03/23/13  POCT UA - MICROSCOPIC ONLY      Result Value Range   WBC, Ur, HPF, POC 0-7     RBC, urine, microscopic 0-3     Bacteria, U Microscopic trace     Mucus, UA trace     Epithelial cells, urine per micros 0-10     Crystals, Ur, HPF, POC 4-22     Casts, Ur, LPF, POC 0-3     Yeast, UA neg    POCT URINALYSIS DIPSTICK      Result Value Range   Color, UA yellow     Clarity, UA clear     Glucose, UA neg     Bilirubin, UA small     Ketones, UA trace     Spec Grav, UA >=1.030     Blood, UA trace     pH, UA 5.5     Protein, UA trace     Urobilinogen, UA 0.2     Nitrite, UA neg     Leukocytes, UA Trace    POCT GLYCOSYLATED HEMOGLOBIN (HGB A1C)      Result Value Range   Hemoglobin A1C 5.6      Assessment & Plan:  Unspecified vitamin D deficiency - Plan: Vit D  25 hydroxy (rtn osteoporosis monitoring). Taking 1000u bid otc  Routine general medical examination at a health care facility - Plan: POCT UA - Microscopic Only, POCT urinalysis dipstick, Hepatitis C antibody, Comprehensive metabolic panel, CBC with Differential, TSH. TDaP done 2009.  Hypertension - check BP outside of office and if >140 on half of readings consider either increasing carvedilol (though pulse already low) or trying an additional BP med. Unfortunately pt states  she tried 2 other meds w/ sig side effects but can't remember what they were.  Hyperlipidemia - Plan: Lipid panel - on crestor 10  Hyperglycemia - Plan: POCT glycosylated hemoglobin (Hb A1C)  Depression - followed by Dr. Eugenia Pancoast in [place] who rxs meds.  Arthritis - on amitriptyline qhs for the left foot nerve pain  Osteopenia - Plan: TSH - DEXA scan on 05/17/2012 showed ostopenia with a left femur T score of -1.7 but a FRAX score of a major osteoporotic fracture risk of 18% (>20% is recommended to start bisphophonates) so borderline.  recommend maximizing vit D as was borderline low in last sev years. Then recheck dexa 04/2014 and if same or worse, start bisphosphonate due to borderline frax score.  Need for prophylactic vaccination against Streptococcus pneumoniae (pneumococcus)  Need for shingles vaccine - Plan: zoster vaccine live, PF, (ZOSTAVAX) 14782 UNT/0.65ML injection, DISCONTINUED: zoster vaccine live, PF, (ZOSTAVAX) 95621 UNT/0.65ML injection  Meds ordered this encounter  Medications  . DISCONTD: zoster vaccine live, PF, (ZOSTAVAX) 30865 UNT/0.65ML injection    Sig: Inject 19,400 Units into the skin once.    Dispense:  1 each    Refill:  0  . zoster vaccine live, PF, (ZOSTAVAX) 78469 UNT/0.65ML injection    Sig: Inject 19,400 Units into the skin once.    Dispense:  1 each    Refill:  0    Norberto Sorenson,  MD MPH

## 2013-04-12 ENCOUNTER — Ambulatory Visit (INDEPENDENT_AMBULATORY_CARE_PROVIDER_SITE_OTHER): Payer: 59 | Admitting: Physician Assistant

## 2013-04-12 VITALS — BP 138/78 | HR 66 | Temp 98.3°F | Resp 18 | Ht 65.5 in | Wt 155.8 lb

## 2013-04-12 DIAGNOSIS — R05 Cough: Secondary | ICD-10-CM

## 2013-04-12 DIAGNOSIS — R059 Cough, unspecified: Secondary | ICD-10-CM

## 2013-04-12 DIAGNOSIS — J111 Influenza due to unidentified influenza virus with other respiratory manifestations: Secondary | ICD-10-CM

## 2013-04-12 MED ORDER — HYDROCODONE-HOMATROPINE 5-1.5 MG/5ML PO SYRP: 5.0000 mL | ORAL_SOLUTION | Freq: Three times a day (TID) | ORAL | Status: DC | PRN

## 2013-04-12 MED ORDER — BENZONATATE 100 MG PO CAPS: 100.0000 mg | ORAL_CAPSULE | Freq: Three times a day (TID) | ORAL | Status: DC | PRN

## 2013-04-12 NOTE — Progress Notes (Signed)
Subjective:    Patient ID: Monica Kane, female    DOB: 04-Apr-1946, 67 y.o.   MRN: [id number]  HPI   Ms. Simerson is a very pleasant 67 yr old female here with concern for illness.  Reports that she had a "really rough" last 4 days.  Fevers to 101.71F, extreme fatigue, achiness, headache, non-productive cough.  Symptoms with abrupt onset.  Last fever was low grade yesterday.  Today afebrile.  Feeling improved today.  Still having some night sweats.  Cough a little more productive now.  No wheezing or SOB.  No nasal congestion.  No GI symptoms.  Some ST currently.  No known sick contacts.  No flu shot this season.  Did take the pneumonia shot - wonders if this is due to that vaccine.   Review of Systems  Constitutional: Positive for fever (24 hours ago) and fatigue.  HENT: Positive for sore throat. Negative for congestion and rhinorrhea.   Respiratory: Positive for cough. Negative for shortness of breath and wheezing.   Cardiovascular: Negative.   Gastrointestinal: Negative.   Musculoskeletal: Negative.   Neurological: Positive for headaches.       Objective:   Physical Exam  Vitals reviewed. Constitutional: She is oriented to person, place, and time. She appears well-developed and well-nourished. No distress.  HENT:  Head: Normocephalic and atraumatic.  Right Ear: Tympanic membrane and ear canal normal.  Left Ear: Tympanic membrane and ear canal normal.  Nose: Nose normal. Right sinus exhibits no maxillary sinus tenderness and no frontal sinus tenderness. Left sinus exhibits no maxillary sinus tenderness and no frontal sinus tenderness.  Mouth/Throat: Uvula is midline, oropharynx is clear and moist and mucous membranes are normal.  Eyes: Conjunctivae are normal. No scleral icterus.  Neck: Neck supple.  Cardiovascular: Normal rate, regular rhythm and normal heart sounds.   Pulmonary/Chest: Effort normal and breath sounds normal. She has no wheezes. She has no rales.  Abdominal:  Soft. There is no tenderness.  Lymphadenopathy:    She has no cervical adenopathy.  Neurological: She is alert and oriented to person, place, and time.  Skin: Skin is warm and dry.  Psychiatric: She has a normal mood and affect. Her behavior is normal.       Assessment & Plan:  Influenza-like illness - Plan: benzonatate (TESSALON) 100 MG capsule, HYDROcodone-homatropine (HYCODAN) 5-1.5 MG/5ML syrup  Cough - Plan: benzonatate (TESSALON) 100 MG capsule, HYDROcodone-homatropine (HYCODAN) 5-1.5 MG/5ML syrup   Ms. Escalante is a very pleasant 67 yr old female here with what appears to be resolving influenza.  Pt reports that she is improving.  Afebrile today.  Exam is normal.  Discussed the course of flu and that I expect her to continue improving over the next few days.  Will treat cough with Tessalon and Hycodan.  Push fluids.  Continue to rest.  If worsening or not improving, pt to call or RTC.  Encouraged flu vaccination next season.  Pt understands and agrees with this plan.  Meds ordered this encounter  Medications  . benzonatate (TESSALON) 100 MG capsule    Sig: Take 1-2 capsules (100-200 mg total) by mouth 3 (three) times daily as needed for cough.    Dispense:  40 capsule    Refill:  0    Order Specific Question:  Supervising Provider    Answer:  Ethelda Chick [[id number]]  . HYDROcodone-homatropine (HYCODAN) 5-1.5 MG/5ML syrup    Sig: Take 5 mLs by mouth every 8 (eight) hours as needed for  cough.    Dispense:  30 mL    Refill:  0    Order Specific Question:  Supervising Provider    Answer:  Nilda Simmer M [[id number]]    Loleta Dicker MHS, PA-C [facility] 12/25/20142:24 PM

## 2013-04-12 NOTE — Patient Instructions (Signed)
I suspect that you had the flu.  It seems to be resolving now, and I think in the next 2-3 days you will likely be feeling completely better.  Use the Tessalon Perles for cough every 8 hours as needed.  Use the Hycodan syrup at bed time if needed for cough - will likely make you sleepy.  Drink plenty of fluids - water is best!  If any symptoms are worsening or not improving, please let us know  Influenza, Adult Influenza ("the flu") is a viral infection of the respiratory tract. It occurs more often in winter months because people spend more time in close contact with one another. Influenza can make you feel very sick. Influenza easily spreads from person to person (contagious). CAUSES  Influenza is caused by a virus that infects the respiratory tract. You can catch the virus by breathing in droplets from an infected person's cough or sneeze. You can also catch the virus by touching something that was recently contaminated with the virus and then touching your mouth, nose, or eyes. SYMPTOMS  Symptoms typically last 4 to 10 days and may include:  Fever.  Chills.  Headache, body aches, and muscle aches.  Sore throat.  Chest discomfort and cough.  Poor appetite.  Weakness or feeling tired.  Dizziness.  Nausea or vomiting. DIAGNOSIS  Diagnosis of influenza is often made based on your history and a physical exam. A nose or throat swab test can be done to confirm the diagnosis. RISKS AND COMPLICATIONS You may be at risk for a more severe case of influenza if you smoke cigarettes, have diabetes, have chronic heart disease (such as heart failure) or lung disease (such as asthma), or if you have a weakened immune system. Elderly people and pregnant women are also at risk for more serious infections. The most common complication of influenza is a lung infection (pneumonia). Sometimes, this complication can require emergency medical care and may be life-threatening. PREVENTION  An annual  influenza vaccination (flu shot) is the best way to avoid getting influenza. An annual flu shot is now routinely recommended for all adults in the [place] TREATMENT  In mild cases, influenza goes away on its own. Treatment is directed at relieving symptoms. For more severe cases, your caregiver may prescribe antiviral medicines to shorten the sickness. Antibiotic medicines are not effective, because the infection is caused by a virus, not by bacteria. HOME CARE INSTRUCTIONS  Only take over-the-counter or prescription medicines for pain, discomfort, or fever as directed by your caregiver.  Use a cool mist humidifier to make breathing easier.  Get plenty of rest until your temperature returns to normal. This usually takes 3 to 4 days.  Drink enough fluids to keep your urine clear or pale yellow.  Cover your mouth and nose when coughing or sneezing, and wash your hands well to avoid spreading the virus.  Stay home from work or school until your fever has been gone for at least 1 full day. SEEK MEDICAL CARE IF:   You have chest pain or a deep cough that worsens or produces more mucus.  You have nausea, vomiting, or diarrhea. SEEK IMMEDIATE MEDICAL CARE IF:   You have difficulty breathing, shortness of breath, or your skin or nails turn bluish.  You have severe neck pain or stiffness.  You have a severe headache, facial pain, or earache.  You have a worsening or recurring fever.  You have nausea or vomiting that cannot be controlled. MAKE SURE YOU:  Understand  these instructions.  Will watch your condition.  Will get help right away if you are not doing well or get worse. Document Released: 04/02/2000 Document Revised: 10/05/2011 Document Reviewed: 07/05/2011 [facility] Patient Information 2014 [place]Care, [place].

## 2013-04-19 HISTORY — PX: CARDIAC CATHETERIZATION: SHX172

## 2013-04-22 ENCOUNTER — Ambulatory Visit (INDEPENDENT_AMBULATORY_CARE_PROVIDER_SITE_OTHER): Payer: 59 | Admitting: Internal Medicine

## 2013-04-22 VITALS — BP 172/92 | HR 88 | Temp 99.0°F | Resp 16 | Ht 65.0 in | Wt 153.4 lb

## 2013-04-22 DIAGNOSIS — R509 Fever, unspecified: Secondary | ICD-10-CM

## 2013-04-22 DIAGNOSIS — R059 Cough, unspecified: Secondary | ICD-10-CM

## 2013-04-22 DIAGNOSIS — J111 Influenza due to unidentified influenza virus with other respiratory manifestations: Secondary | ICD-10-CM

## 2013-04-22 DIAGNOSIS — R05 Cough: Secondary | ICD-10-CM

## 2013-04-22 DIAGNOSIS — R69 Illness, unspecified: Secondary | ICD-10-CM

## 2013-04-22 DIAGNOSIS — J019 Acute sinusitis, unspecified: Secondary | ICD-10-CM

## 2013-04-22 LAB — POCT CBC
Granulocyte percent: 72.8 %G (ref 37–80)
HCT, POC: 44.6 % (ref 37.7–47.9)
Hemoglobin: 14.4 g/dL (ref 12.2–16.2)
LYMPH, POC: 1.8 (ref 0.6–3.4)
MCH, POC: 27 pg (ref 27–31.2)
MCHC: 32.3 g/dL (ref 31.8–35.4)
MCV: 83.7 fL (ref 80–97)
MID (cbc): 0.4 (ref 0–0.9)
MPV: 9.3 fL (ref 0–99.8)
PLATELET COUNT, POC: 305 10*3/uL (ref 142–424)
POC GRANULOCYTE: 6 (ref 2–6.9)
POC LYMPH %: 21.9 % (ref 10–50)
POC MID %: 5.3 % (ref 0–12)
RBC: 5.33 M/uL (ref 4.04–5.48)
RDW, POC: 14.2 %
WBC: 8.3 10*3/uL (ref 4.6–10.2)

## 2013-04-22 MED ORDER — AZITHROMYCIN 250 MG PO TABS: ORAL_TABLET | ORAL | Status: DC

## 2013-04-22 MED ORDER — HYDROCODONE-HOMATROPINE 5-1.5 MG/5ML PO SYRP: 5.0000 mL | ORAL_SOLUTION | Freq: Three times a day (TID) | ORAL | Status: DC | PRN

## 2013-04-22 NOTE — Progress Notes (Addendum)
Subjective:    Patient ID: Monica Kane, female    DOB: 1945-09-19, 68 y.o.   MRN: [id number]  HPI This chart was scribed for [facility], by Lovena Le Day, Scribe. This patient was seen in room 13 and the patient's care was started at 3:04 PM.  HPI Comments: Monica Kane is a 68 y.o. female who presents to the [facility] for recheck and is today complaining of constant, gradually improved flu like symptoms in which she reports first noticing her flu like symptoms about 2 weeks ago. She reports has still been feeling very fatigued but has improved since she was feeling at her worse w/the flu. She reports associated fever, cough (which keeps her up at night), nasal congestion, mild bilateral eye discomfort, fever and night sweats.   Patient Active Problem List   Diagnosis Date Noted  . Osteopenia 03/23/2013  . Kidney stones, calcium oxalate 03/23/2013  . Hypertension 03/31/2011  . Hyperlipidemia 03/31/2011  . Vitamin d deficiency 03/31/2011  . Hyperglycemia 03/31/2011  . Foot pain, left 03/24/2011  . Hallux rigidus 03/24/2011  . Metatarsalgia of left foot 03/24/2011  . Lateral epicondylitis of left elbow 02/24/2011  . Migraine 02/24/2011  . Depression 02/24/2011  . Cataract 02/24/2011  . Arthritis 02/24/2011    Past Surgical History  Procedure Laterality Date  . Abdominal hysterectomy  1989  . Lateral release right elbow  2010  . Shoulder arthroscopy w/ rotator cuff repair  2006  . Cervical spine surgery  2004    C 5-6  . Parotid gland tumor excision  1976    benign  . Diagnostic laparoscopy  1976    fibroid removed  . Appendectomy    . Colonoscopy    . Cervical fusion  5/12  . Shoulder arthroscopy distal clavicle excision and open rotator cuff repair  2006  . Elbow arthroscopy  11/10  . Parotid gland tumor excision  1976    rt  . Lateral epicondyle release  03/01/2011    Procedure: TENNIS ELBOW RELEASE;  Surgeon: Lorn Junes, MD;   Location: [place];  Service: Orthopedics;  Laterality: Left;  TENOTOMY ELBOW LATERAL EPICONDYLITIS TENNIS ELBOW  . Eye surgery      Family History  Problem Relation Age of Onset  . Cancer Mother     lung  . Cancer Father     lung; + tobacco  . Heart disease Maternal Grandfather     heart attack  . Heart disease Paternal Grandfather     heart attack  . Diabetes Sister   . Hypertension Sister   . Hyperlipidemia Sister     History   Social History  . Marital Status: Married    Spouse Name: Crissy Mccreadie    Number of Children: N/A  . Years of Education: N/A   Occupational History  . Not on file.   Social History Main Topics  . Smoking status: Never Smoker   . Smokeless tobacco: Never Used  . Alcohol Use: 0.0 oz/week    2-3 Glasses of wine per week  . Drug Use: No  . Sexual Activity: Yes   Other Topics Concern  . Not on file   Social History Narrative   Lives with her husband.  He has 2 adult children. Patient has a Financial risk analyst, and she does exercise.    Allergies  Allergen Reactions  . Penicillins Swelling and Rash    Results for orders placed in visit on 03/23/13  HEPATITIS C ANTIBODY      Result Value Range   HCV Ab NEGATIVE  NEGATIVE  VITAMIN D 25 HYDROXY      Result Value Range   Vit D, 25-Hydroxy 46  30 - 89 ng/mL  COMPREHENSIVE METABOLIC PANEL      Result Value Range   Sodium 140  135 - 145 mEq/L   Potassium 4.1  3.5 - 5.3 mEq/L   Chloride 104  96 - 112 mEq/L   CO2 29  19 - 32 mEq/L   Glucose, Bld 94  70 - 99 mg/dL   BUN 21  6 - 23 mg/dL   Creat 0.81  0.50 - 1.10 mg/dL   Total Bilirubin 0.5  0.3 - 1.2 mg/dL   Alkaline Phosphatase 71  39 - 117 U/L   AST 16  0 - 37 U/L   ALT 15  0 - 35 U/L   Total Protein 6.4  6.0 - 8.3 g/dL   Albumin 4.1  3.5 - 5.2 g/dL   Calcium 9.6  8.4 - 10.5 mg/dL  LIPID PANEL      Result Value Range   Cholesterol 162  0 - 200 mg/dL   Triglycerides 67  <150 mg/dL   HDL 66  >39 mg/dL   Total  CHOL/HDL Ratio 2.5     VLDL 13  0 - 40 mg/dL   LDL Cholesterol 83  0 - 99 mg/dL  CBC WITH DIFFERENTIAL      Result Value Range   WBC 4.3  4.0 - 10.5 K/uL   RBC 4.90  3.87 - 5.11 MIL/uL   Hemoglobin 12.7  12.0 - 15.0 g/dL   HCT 37.6  36.0 - 46.0 %   MCV 76.7 (*) 78.0 - 100.0 fL   MCH 25.9 (*) 26.0 - 34.0 pg   MCHC 33.8  30.0 - 36.0 g/dL   RDW 13.8  11.5 - 15.5 %   Platelets 189  150 - 400 K/uL   Neutrophils Relative % 58  43 - 77 %   Neutro Abs 2.5  1.7 - 7.7 K/uL   Lymphocytes Relative 32  12 - 46 %   Lymphs Abs 1.3  0.7 - 4.0 K/uL   Monocytes Relative 7  3 - 12 %   Monocytes Absolute 0.3  0.1 - 1.0 K/uL   Eosinophils Relative 2  0 - 5 %   Eosinophils Absolute 0.1  0.0 - 0.7 K/uL   Basophils Relative 1  0 - 1 %   Basophils Absolute 0.0  0.0 - 0.1 K/uL   Smear Review Criteria for review not met    TSH      Result Value Range   TSH 3.080  0.350 - 4.500 uIU/mL  POCT UA - MICROSCOPIC ONLY      Result Value Range   WBC, Ur, HPF, POC 0-7     RBC, urine, microscopic 0-3     Bacteria, U Microscopic trace     Mucus, UA trace     Epithelial cells, urine per micros 0-10     Crystals, Ur, HPF, POC 4-22     Casts, Ur, LPF, POC 0-3     Yeast, UA neg    POCT URINALYSIS DIPSTICK      Result Value Range   Color, UA yellow     Clarity, UA clear     Glucose, UA neg     Bilirubin, UA small     Ketones, UA trace  Spec Grav, UA >=1.030     Blood, UA trace     pH, UA 5.5     Protein, UA trace     Urobilinogen, UA 0.2     Nitrite, UA neg     Leukocytes, UA Trace    POCT GLYCOSYLATED HEMOGLOBIN (HGB A1C)      Result Value Range   Hemoglobin A1C 5.6     Review of Systems  Constitutional: Positive for fever and fatigue.  HENT: Positive for congestion, ear pain and sinus pressure.   Respiratory: Positive for cough. Negative for shortness of breath.   Cardiovascular: Negative for chest pain.  Gastrointestinal: Negative for nausea, vomiting, abdominal pain and diarrhea.    Musculoskeletal: Negative for back pain.  Skin: Negative for color change.      Objective:   Physical Exam  Nursing note and vitals reviewed. Constitutional: She is oriented to person, place, and time. She appears well-developed and well-nourished. No distress.  HENT:  Head: Normocephalic and atraumatic.  Right Ear: External ear normal.  Left Ear: External ear normal.  Purulent discharge from both nares/  right maxillary percussion tenderness  Eyes: Conjunctivae and EOM are normal. Pupils are equal, round, and reactive to light. Right eye exhibits no discharge. Left eye exhibits no discharge.  Neck: Normal range of motion. No thyromegaly present.  Cardiovascular: Normal rate, regular rhythm, normal heart sounds and intact distal pulses.   No murmur heard. Pulmonary/Chest: Effort normal and breath sounds normal. No respiratory distress. She has no wheezes.  Musculoskeletal: Normal range of motion. She exhibits no edema.  Lymphadenopathy:    She has no cervical adenopathy.  Neurological: She is alert and oriented to person, place, and time.  Skin: Skin is warm and dry.  Psychiatric: She has a normal mood and affect. Thought content normal.   Triage Vitals: BP 172/92  Pulse 88  Temp(Src) 99 F (37.2 C)  Resp 16  Ht $R'5\' 5"'AS$  (1.651 m)  Wt 153 lb 6.4 oz (69.582 kg)  BMI 25.53 kg/m2  SpO2 96%  DIAGNOSTIC STUDIES: Oxygen Saturation is 96% on room air, normal by my interpretation.    Results for orders placed in visit on 04/22/13  POCT CBC      Result Value Range   WBC 8.3  4.6 - 10.2 K/uL   Lymph, poc 1.8  0.6 - 3.4   POC LYMPH PERCENT 21.9  10 - 50 %L   MID (cbc) 0.4  0 - 0.9   POC MID % 5.3  0 - 12 %M   POC Granulocyte 6.0  2 - 6.9   Granulocyte percent 72.8  37 - 80 %G   RBC 5.33  4.04 - 5.48 M/uL   Hemoglobin 14.4  12.2 - 16.2 g/dL   HCT, POC 44.6  37.7 - 47.9 %   MCV 83.7  80 - 97 fL   MCH, POC 27.0  27 - 31.2 pg   MCHC 32.3  31.8 - 35.4 g/dL   RDW, POC 14.2      Platelet Count, POC 305  142 - 424 K/uL   MPV 9.3  0 - 99.8 fL         Assessment & Plan:  I have completed the patient encounter in its entirety as documented by the scribe, with editing by me where necessary. Arleen Bar P. Laney Pastor, M.D.  Fever, unspecified - Plan: POCT CBC  Acute sinusitis, unspecified - Plan: azithromycin (ZITHROMAX) 250 MG tablet  Influenza-like illness - Plan: HYDROcodone-homatropine (HYCODAN)  5-1.5 MG/5ML syrup  Cough - Plan: HYDROcodone-homatropine (HYCODAN) 5-1.5 MG/5ML syrup  Meds ordered this encounter  Medications  . HYDROcodone-homatropine (HYCODAN) 5-1.5 MG/5ML syrup    Sig: Take 5 mLs by mouth every 8 (eight) hours as needed for cough.    Dispense:  30 mL    Refill:  0    Order Specific Question:  Supervising Provider    Answer:  Wardell Honour [[id number]]  . azithromycin (ZITHROMAX) 250 MG tablet    Sig: As packaged    Dispense:  6 tablet    Refill:  0

## 2013-05-01 ENCOUNTER — Telehealth: Payer: Self-pay | Admitting: *Deleted

## 2013-05-01 NOTE — Telephone Encounter (Signed)
Just a note from pt via questionnaire that was sent to pt.   Upper no of blood pressure reading continues to be mostly high, from 170-190. Sometimes normal. Lower no usually now in normal range.

## 2013-05-18 ENCOUNTER — Ambulatory Visit
Admission: RE | Admit: 2013-05-18 | Discharge: 2013-05-18 | Disposition: A | Discharge status: Home / Self Care | Payer: Self-pay | Source: Ambulatory Visit

## 2013-05-18 DIAGNOSIS — Z1231 Encounter for screening mammogram for malignant neoplasm of breast: Secondary | ICD-10-CM

## 2013-07-02 ENCOUNTER — Encounter: Payer: Self-pay | Admitting: Emergency Medicine

## 2013-07-22 ENCOUNTER — Emergency Department (HOSPITAL_COMMUNITY): Payer: 59

## 2013-07-22 ENCOUNTER — Encounter (HOSPITAL_COMMUNITY): Payer: Self-pay | Admitting: Emergency Medicine

## 2013-07-22 ENCOUNTER — Observation Stay (HOSPITAL_COMMUNITY)
Admission: EM | Admit: 2013-07-22 | Discharge: 2013-07-23 | Disposition: A | Discharge status: Home / Self Care | Payer: 59 | Attending: Family Medicine | Admitting: Family Medicine

## 2013-07-22 DIAGNOSIS — R079 Chest pain, unspecified: Secondary | ICD-10-CM

## 2013-07-22 DIAGNOSIS — G43909 Migraine, unspecified, not intractable, without status migrainosus: Secondary | ICD-10-CM | POA: Insufficient documentation

## 2013-07-22 DIAGNOSIS — M949 Disorder of cartilage, unspecified: Secondary | ICD-10-CM

## 2013-07-22 DIAGNOSIS — R072 Precordial pain: Principal | ICD-10-CM | POA: Insufficient documentation

## 2013-07-22 DIAGNOSIS — F411 Generalized anxiety disorder: Secondary | ICD-10-CM | POA: Insufficient documentation

## 2013-07-22 DIAGNOSIS — N189 Chronic kidney disease, unspecified: Secondary | ICD-10-CM | POA: Insufficient documentation

## 2013-07-22 DIAGNOSIS — F329 Major depressive disorder, single episode, unspecified: Secondary | ICD-10-CM | POA: Insufficient documentation

## 2013-07-22 DIAGNOSIS — F3289 Other specified depressive episodes: Secondary | ICD-10-CM | POA: Insufficient documentation

## 2013-07-22 DIAGNOSIS — F32A Depression, unspecified: Secondary | ICD-10-CM

## 2013-07-22 DIAGNOSIS — I129 Hypertensive chronic kidney disease with stage 1 through stage 4 chronic kidney disease, or unspecified chronic kidney disease: Secondary | ICD-10-CM | POA: Insufficient documentation

## 2013-07-22 DIAGNOSIS — R0789 Other chest pain: Secondary | ICD-10-CM

## 2013-07-22 DIAGNOSIS — I10 Essential (primary) hypertension: Secondary | ICD-10-CM

## 2013-07-22 DIAGNOSIS — M899 Disorder of bone, unspecified: Secondary | ICD-10-CM | POA: Insufficient documentation

## 2013-07-22 DIAGNOSIS — H269 Unspecified cataract: Secondary | ICD-10-CM | POA: Insufficient documentation

## 2013-07-22 DIAGNOSIS — E785 Hyperlipidemia, unspecified: Secondary | ICD-10-CM | POA: Insufficient documentation

## 2013-07-22 DIAGNOSIS — E559 Vitamin D deficiency, unspecified: Secondary | ICD-10-CM | POA: Insufficient documentation

## 2013-07-22 LAB — CBC
HEMATOCRIT: 39.5 % (ref 36.0–46.0)
Hemoglobin: 13 g/dL (ref 12.0–15.0)
MCH: 26.4 pg (ref 26.0–34.0)
MCHC: 32.9 g/dL (ref 30.0–36.0)
MCV: 80.1 fL (ref 78.0–100.0)
Platelets: 192 10*3/uL (ref 150–400)
RBC: 4.93 MIL/uL (ref 3.87–5.11)
RDW: 13.4 % (ref 11.5–15.5)
WBC: 4.8 10*3/uL (ref 4.0–10.5)

## 2013-07-22 LAB — BASIC METABOLIC PANEL WITH GFR
BUN: 19 mg/dL (ref 6–23)
CO2: 23 meq/L (ref 19–32)
Calcium: 8.8 mg/dL (ref 8.4–10.5)
Chloride: 104 meq/L (ref 96–112)
Creatinine, Ser: 0.65 mg/dL (ref 0.50–1.10)
GFR calc Af Amer: >90 mL/min
GFR calc non Af Amer: >90 mL/min
Glucose, Bld: 131 mg/dL — ABNORMAL HIGH (ref 70–99)
Potassium: 4 meq/L (ref 3.7–5.3)
Sodium: 141 meq/L (ref 137–147)

## 2013-07-22 LAB — I-STAT TROPONIN, ED: Troponin i, poc: 0 ng/mL (ref 0.00–0.08)

## 2013-07-22 LAB — PRO B NATRIURETIC PEPTIDE: Pro B Natriuretic peptide (BNP): 42.7 pg/mL (ref 0–125)

## 2013-07-22 MED ORDER — ASPIRIN 81 MG PO CHEW
162.0000 mg | CHEWABLE_TABLET | Freq: Once | ORAL | Status: AC
  Administered 2013-07-22: 162 mg via ORAL
  Filled 2013-07-22: qty 2

## 2013-07-22 MED ORDER — NITROGLYCERIN 0.4 MG SL SUBL
0.4000 mg | SUBLINGUAL_TABLET | SUBLINGUAL | Status: DC | PRN
  Administered 2013-07-22 (×2): 0.4 mg via SUBLINGUAL

## 2013-07-22 NOTE — ED Provider Notes (Signed)
CSN: [id number]     Arrival date & time 07/22/13  2118 History   First MD Initiated Contact with Patient 07/22/13 2309     Chief Complaint  Patient presents with  . Chest Pain     (Consider location/radiation/quality/duration/timing/severity/associated sxs/prior Treatment) HPI Hx per PT - CP onset 7pm, substernal radiating to jaws with severe nausea. No emesis, no ABD pain, severe pain lasted abount an hour, took 1 baby ASA, now in the ER pain 2/10, no SOB. No F/C, no leg pain or swelling, no h/o same. With symptoms worsened by walking/ exertion. H/o stress test more than 5 years ago.    Past Medical History  Diagnosis Date  . Hypertension   . Depression   . Migraine   . Hyperlipidemia   . Cataract   . Chronic kidney disease   . Anxiety    Past Surgical History  Procedure Laterality Date  . Abdominal hysterectomy  1989  . Lateral release right elbow  2010  . Shoulder arthroscopy w/ rotator cuff repair  2006  . Cervical spine surgery  2004    C 5-6  . Parotid gland tumor excision  1976    benign  . Diagnostic laparoscopy  1976    fibroid removed  . Appendectomy    . Colonoscopy    . Cervical fusion  5/12  . Shoulder arthroscopy distal clavicle excision and open rotator cuff repair  2006  . Elbow arthroscopy  11/10  . Parotid gland tumor excision  1976    rt  . Lateral epicondyle release  03/01/2011    Procedure: TENNIS ELBOW RELEASE;  Surgeon: Lorn Junes, MD;  Location: [place];  Service: Orthopedics;  Laterality: Left;  TENOTOMY ELBOW LATERAL EPICONDYLITIS TENNIS ELBOW  . Eye surgery     Family History  Problem Relation Age of Onset  . Cancer Mother     lung  . Cancer Father     lung; + tobacco  . Heart disease Maternal Grandfather     heart attack  . Heart disease Paternal Grandfather     heart attack  . Diabetes Sister   . Hypertension Sister   . Hyperlipidemia Sister    History  Substance Use Topics  . Smoking status: Never Smoker    . Smokeless tobacco: Never Used  . Alcohol Use: 0.0 oz/week    2-3 Glasses of wine per week   OB History   Grav Para Term Preterm Abortions TAB SAB Ect Mult Living                 Review of Systems  Constitutional: Negative for fever and chills.  HENT: Negative for sore throat and trouble swallowing.   Respiratory: Negative for shortness of breath.   Cardiovascular: Positive for chest pain.  Gastrointestinal: Positive for nausea. Negative for abdominal pain.  Genitourinary: Negative for dysuria.  Musculoskeletal: Positive for neck pain. Negative for neck stiffness.  Skin: Negative for rash.  Neurological: Negative for headaches.  All other systems reviewed and are negative.      Allergies  Penicillins  Home Medications   Current Outpatient Rx  Name  Route  Sig  Dispense  Refill  . amitriptyline (ELAVIL) 25 MG tablet      Take one at bedtime   90 tablet   3   . azithromycin (ZITHROMAX) 250 MG tablet      As packaged   6 tablet   0   . benzonatate (TESSALON) 100 MG capsule  Oral   Take 1-2 capsules (100-200 mg total) by mouth 3 (three) times daily as needed for cough.   40 capsule   0   . carvedilol (COREG) 3.125 MG tablet   Oral   Take 1 tablet (3.125 mg total) by mouth 2 (two) times daily with a meal.   180 tablet   3   . cholecalciferol (VITAMIN D) 1000 UNITS tablet   Oral   Take 1,000 Units by mouth 2 (two) times daily.          Marland Kitchen HYDROcodone-homatropine (HYCODAN) 5-1.5 MG/5ML syrup   Oral   Take 5 mLs by mouth every 8 (eight) hours as needed for cough.   30 mL   0   . meloxicam (MOBIC) 15 MG tablet   Oral   Take 15 mg by mouth at bedtime.         . rosuvastatin (CRESTOR) 10 MG tablet   Oral   Take 1 tablet (10 mg total) by mouth at bedtime.   90 tablet   3   . sertraline (ZOLOFT) 100 MG tablet   Oral   Take 250 mg by mouth daily.          . traZODone (DESYREL) 50 MG tablet   Oral   Take 50-100 mg by mouth at bedtime.           . zoster vaccine live, PF, (ZOSTAVAX) 55732 UNT/0.65ML injection   Subcutaneous   Inject 19,400 Units into the skin once.   1 each   0    BP 194/92  Pulse 66  Temp(Src) 98.9 F (37.2 C) (Oral)  Resp 16  SpO2 96% Physical Exam  Constitutional: She is oriented to person, place, and time. She appears well-developed and well-nourished.  HENT:  Head: Normocephalic and atraumatic.  Eyes: EOM are normal. Pupils are equal, round, and reactive to light.  Neck: Neck supple.  Cardiovascular: Normal rate, regular rhythm and intact distal pulses.   Pulmonary/Chest: Effort normal and breath sounds normal. No respiratory distress.  Abdominal: She exhibits no distension. There is no tenderness.  Musculoskeletal: Normal range of motion. She exhibits no edema and no tenderness.  Neurological: She is alert and oriented to person, place, and time.  Skin: Skin is warm and dry.    ED Course  Procedures (including critical care time) Labs Review Labs Reviewed  BASIC METABOLIC PANEL - Abnormal; Notable for the following:    Glucose, Bld 131 (*)    All other components within normal limits  CBC  PRO B NATRIURETIC PEPTIDE  I-STAT TROPOININ, ED   Imaging Review Dg Chest 2 View  07/22/2013   CLINICAL DATA:  Chest pain left-sided.  EXAM: CHEST  2 VIEW  COMPARISON:  None.  FINDINGS: Lungs are adequately inflated without focal consolidation or effusion. The cardiomediastinal silhouette is within normal. There is a suggestion of several calcified mediastinal nodes. There is minimal spondylosis of the spine. Fusion hardware is present of the cervical spine.  IMPRESSION: No active cardiopulmonary disease.   Electronically Signed   By: Marin Olp M.D.   On: 07/22/2013 22:24     EKG Interpretation   Date/Time:  Sunday July 22 2013 21:24:20 EDT Ventricular Rate:  68 PR Interval:  168 QRS Duration: 84 QT Interval:  402 QTC Calculation: 427 R Axis:   34 Text Interpretation:  Normal sinus  rhythm Normal ECG No significant change  since last tracing Confirmed by Mlissa Tamayo  MD, Raissa Dam ([id number]) on 07/22/2013  11:15:46 PM  ASA, NTG provided Repeat BP normalizing  MED consulted, [facility] FP to admit - evaluated PT bedside MDM   Dx: CP  Has ACS risk factors and presentation concerning for the same.  ECG, CXR and labs reviewed Medications provided FP admit    Teressa Lower, MD 07/23/13 [phone]

## 2013-07-22 NOTE — ED Notes (Signed)
Pt. reports mid chest pain / bilateral jaw pain / left upper back pain with nausea and diaphoresis onset this evening . Pt. took 1 baby ASA 81 mg prior to arrival .

## 2013-07-23 ENCOUNTER — Other Ambulatory Visit: Payer: Self-pay

## 2013-07-23 ENCOUNTER — Encounter (HOSPITAL_COMMUNITY): Payer: Self-pay | Admitting: *Deleted

## 2013-07-23 ENCOUNTER — Encounter (HOSPITAL_COMMUNITY)
Admission: EM | Disposition: A | Discharge status: Home / Self Care | Payer: Self-pay | Source: Home / Self Care | Attending: Emergency Medicine

## 2013-07-23 DIAGNOSIS — E785 Hyperlipidemia, unspecified: Secondary | ICD-10-CM

## 2013-07-23 DIAGNOSIS — F329 Major depressive disorder, single episode, unspecified: Secondary | ICD-10-CM

## 2013-07-23 DIAGNOSIS — R079 Chest pain, unspecified: Secondary | ICD-10-CM

## 2013-07-23 DIAGNOSIS — R072 Precordial pain: Secondary | ICD-10-CM

## 2013-07-23 DIAGNOSIS — I517 Cardiomegaly: Secondary | ICD-10-CM

## 2013-07-23 DIAGNOSIS — R0789 Other chest pain: Secondary | ICD-10-CM | POA: Diagnosis present

## 2013-07-23 DIAGNOSIS — F3289 Other specified depressive episodes: Secondary | ICD-10-CM

## 2013-07-23 DIAGNOSIS — I10 Essential (primary) hypertension: Secondary | ICD-10-CM

## 2013-07-23 HISTORY — PX: LEFT HEART CATHETERIZATION WITH CORONARY ANGIOGRAM: SHX5451

## 2013-07-23 LAB — LIPID PANEL
CHOLESTEROL: 169 mg/dL (ref 0–200)
HDL: 67 mg/dL (ref >39)
LDL Cholesterol: 72 mg/dL (ref 0–99)
Total CHOL/HDL Ratio: 2.5 RATIO
Triglycerides: 151 mg/dL — ABNORMAL HIGH (ref <150)
VLDL: 30 mg/dL (ref 0–40)

## 2013-07-23 LAB — TROPONIN I
Troponin I: <0.3 ng/mL (ref <0.30)
Troponin I: <0.3 ng/mL (ref <0.30)
Troponin I: <0.3 ng/mL (ref <0.30)

## 2013-07-23 LAB — COMPREHENSIVE METABOLIC PANEL
ALT: 14 U/L (ref 0–35)
AST: 18 U/L (ref 0–37)
Albumin: 3.6 g/dL (ref 3.5–5.2)
Alkaline Phosphatase: 81 U/L (ref 39–117)
BUN: 13 mg/dL (ref 6–23)
CALCIUM: 9.4 mg/dL (ref 8.4–10.5)
CO2: 27 meq/L (ref 19–32)
CREATININE: 0.67 mg/dL (ref 0.50–1.10)
Chloride: 105 mEq/L (ref 96–112)
GFR calc Af Amer: >90 mL/min (ref >90)
Glucose, Bld: 96 mg/dL (ref 70–99)
Potassium: 4.1 mEq/L (ref 3.7–5.3)
SODIUM: 145 meq/L (ref 137–147)
Total Bilirubin: 0.6 mg/dL (ref 0.3–1.2)
Total Protein: 6.1 g/dL (ref 6.0–8.3)

## 2013-07-23 LAB — CBC
HEMATOCRIT: 38.3 % (ref 36.0–46.0)
HEMOGLOBIN: 12.5 g/dL (ref 12.0–15.0)
MCH: 26.4 pg (ref 26.0–34.0)
MCHC: 32.6 g/dL (ref 30.0–36.0)
MCV: 80.8 fL (ref 78.0–100.0)
Platelets: 162 10*3/uL (ref 150–400)
RBC: 4.74 MIL/uL (ref 3.87–5.11)
RDW: 13.4 % (ref 11.5–15.5)
WBC: 5.9 10*3/uL (ref 4.0–10.5)

## 2013-07-23 LAB — HEMOGLOBIN A1C
Hgb A1c MFr Bld: 5.9 % — ABNORMAL HIGH (ref <5.7)
Mean Plasma Glucose: 123 mg/dL — ABNORMAL HIGH (ref <117)

## 2013-07-23 LAB — PROTIME-INR
INR: 0.99 (ref 0.00–1.49)
Prothrombin Time: 12.9 seconds (ref 11.6–15.2)

## 2013-07-23 LAB — CREATININE, SERUM
Creatinine, Ser: 0.72 mg/dL (ref 0.50–1.10)
GFR calc Af Amer: >90 mL/min (ref >90)
GFR calc non Af Amer: 88 mL/min — ABNORMAL LOW (ref >90)

## 2013-07-23 LAB — TSH: TSH: 2.15 u[IU]/mL (ref 0.350–4.500)

## 2013-07-23 SURGERY — LEFT HEART CATHETERIZATION WITH CORONARY ANGIOGRAM
Anesthesia: LOCAL

## 2013-07-23 MED ORDER — TRAZODONE 25 MG HALF TABLET
25.0000 mg | ORAL_TABLET | Freq: Every day | ORAL | Status: DC
  Filled 2013-07-23: qty 2

## 2013-07-23 MED ORDER — MIDAZOLAM HCL 2 MG/2ML IJ SOLN
INTRAMUSCULAR | Status: AC
  Filled 2013-07-23: qty 2

## 2013-07-23 MED ORDER — LISINOPRIL 10 MG PO TABS
10.0000 mg | ORAL_TABLET | Freq: Every day | ORAL | Status: DC
  Administered 2013-07-23: 10 mg via ORAL
  Filled 2013-07-23: qty 1

## 2013-07-23 MED ORDER — ASPIRIN 81 MG PO CHEW
81.0000 mg | CHEWABLE_TABLET | Freq: Every day | ORAL | Status: DC
  Administered 2013-07-23: 81 mg via ORAL
  Filled 2013-07-23: qty 1

## 2013-07-23 MED ORDER — NITROGLYCERIN 0.4 MG SL SUBL: 0.4000 mg | SUBLINGUAL_TABLET | SUBLINGUAL | Status: DC | PRN

## 2013-07-23 MED ORDER — FENTANYL CITRATE 0.05 MG/ML IJ SOLN
INTRAMUSCULAR | Status: AC
  Filled 2013-07-23: qty 2

## 2013-07-23 MED ORDER — ATORVASTATIN CALCIUM 20 MG PO TABS
20.0000 mg | ORAL_TABLET | Freq: Every day | ORAL | Status: DC
Start: 2013-07-23 — End: 2013-07-24
  Administered 2013-07-23: 20 mg via ORAL
  Filled 2013-07-23: qty 1

## 2013-07-23 MED ORDER — HEPARIN SODIUM (PORCINE) 1000 UNIT/ML IJ SOLN
INTRAMUSCULAR | Status: AC
  Filled 2013-07-23: qty 1

## 2013-07-23 MED ORDER — ASPIRIN 81 MG PO CHEW: 81.0000 mg | CHEWABLE_TABLET | ORAL | Status: AC

## 2013-07-23 MED ORDER — HEPARIN SODIUM (PORCINE) 5000 UNIT/ML IJ SOLN
5000.0000 [IU] | Freq: Three times a day (TID) | INTRAMUSCULAR | Status: DC
  Administered 2013-07-23: 5000 [IU] via SUBCUTANEOUS
  Filled 2013-07-23 (×4): qty 1

## 2013-07-23 MED ORDER — VITAMIN D3 25 MCG (1000 UNIT) PO TABS
1000.0000 [IU] | ORAL_TABLET | Freq: Two times a day (BID) | ORAL | Status: DC
  Administered 2013-07-23: 1000 [IU] via ORAL
  Filled 2013-07-23 (×2): qty 1

## 2013-07-23 MED ORDER — VERAPAMIL HCL 2.5 MG/ML IV SOLN
INTRAVENOUS | Status: AC
  Filled 2013-07-23: qty 2

## 2013-07-23 MED ORDER — LIDOCAINE HCL (PF) 1 % IJ SOLN
INTRAMUSCULAR | Status: AC
Start: 2013-07-23 — End: 2013-07-23
  Filled 2013-07-23: qty 30

## 2013-07-23 MED ORDER — SERTRALINE HCL 50 MG PO TABS
250.0000 mg | ORAL_TABLET | Freq: Every day | ORAL | Status: DC
  Administered 2013-07-23: 250 mg via ORAL
  Filled 2013-07-23: qty 1

## 2013-07-23 MED ORDER — SODIUM CHLORIDE 0.9 % IV SOLN
INTRAVENOUS | Status: DC
  Administered 2013-07-23: 11:00:00 via INTRAVENOUS

## 2013-07-23 MED ORDER — NITROGLYCERIN 0.2 MG/ML ON CALL CATH LAB
INTRAVENOUS | Status: AC
  Filled 2013-07-23: qty 1

## 2013-07-23 MED ORDER — HEPARIN (PORCINE) IN NACL 2-0.9 UNIT/ML-% IJ SOLN
INTRAMUSCULAR | Status: AC
  Filled 2013-07-23: qty 1500

## 2013-07-23 MED ORDER — DIAZEPAM 5 MG PO TABS
5.0000 mg | ORAL_TABLET | ORAL | Status: AC
  Administered 2013-07-23: 5 mg via ORAL
  Filled 2013-07-23: qty 1

## 2013-07-23 MED ORDER — SODIUM CHLORIDE 0.9 % IV SOLN: INTRAVENOUS | Status: AC

## 2013-07-23 MED ORDER — CARVEDILOL 3.125 MG PO TABS
3.1250 mg | ORAL_TABLET | Freq: Two times a day (BID) | ORAL | Status: DC
  Administered 2013-07-23: 3.125 mg via ORAL
  Filled 2013-07-23 (×3): qty 1

## 2013-07-23 MED ORDER — SODIUM CHLORIDE 0.9 % IJ SOLN: 3.0000 mL | Freq: Two times a day (BID) | INTRAMUSCULAR | Status: DC

## 2013-07-23 MED ORDER — ACETAMINOPHEN 325 MG PO TABS: 650.0000 mg | ORAL_TABLET | Freq: Four times a day (QID) | ORAL | Status: DC | PRN

## 2013-07-23 MED ORDER — LISINOPRIL 10 MG PO TABS: 10.0000 mg | ORAL_TABLET | Freq: Every day | ORAL | Status: DC

## 2013-07-23 MED ORDER — SODIUM CHLORIDE 0.9 % IV SOLN: 250.0000 mL | INTRAVENOUS | Status: DC | PRN

## 2013-07-23 MED ORDER — SODIUM CHLORIDE 0.9 % IJ SOLN: 3.0000 mL | INTRAMUSCULAR | Status: DC | PRN

## 2013-07-23 NOTE — Progress Notes (Signed)
*  PRELIMINARY RESULTS* Echocardiogram 2D Echocardiogram has been performed.  Leavy Cella 07/23/2013, 9:52 AM

## 2013-07-23 NOTE — Progress Notes (Signed)
UR completed 

## 2013-07-23 NOTE — Progress Notes (Signed)
Pt discharged home with husband. Discharge instructions given including radial site care and understanding was verbalized, with no further questions at this time.

## 2013-07-23 NOTE — Discharge Summary (Signed)
[facility] Discharge Summary  Patient name: Monica Kane Medical record number: [id number] Date of birth: Feb 06, 1946 Age: 68 y.o. Gender: female Date of Admission: 07/22/2013  Date of Discharge: 07/23/2013 Admitting Physician: Dickie La, MD  Primary Care Provider: Jenny Reichmann, MD Consultants: Cards  Indication for Hospitalization: chest pain  Discharge Diagnoses/Problem List:  Patient Active Problem List   Diagnosis Date Noted  . Chest pain, midsternal 07/23/2013  . Osteopenia 03/23/2013  . Hypertension 03/31/2011  . Hyperlipidemia 03/31/2011   Disposition: home  Discharge Condition: improved  Discharge Exam:  BP 145/61  Pulse 58  Temp(Src) 97.8 F (36.6 C) (Oral)  Resp 18  Ht 5\' 5"  (1.651 m)  Wt 152 lb 1.6 oz (68.992 kg)  BMI 25.31 kg/m2  SpO2 98% Gen: NAD, alert, cooperative with exam, resting comfortably in bed with husband at bedside HEENT: NCAT, EOMI, PERRL  Neck: FROM, supple; scar x2 from previous surg  CV: RRR, good S1/S2, no murmur, cap refill <3  Resp: CTABL, no wheezes, non-labored  Abd: SNTND, BS present, no guarding or organomegaly  Ext: No edema, warm, normal tone, moves UE/LE spontaneously  Neuro: Alert and oriented, grossly intact  Skin: no rashes no lesions apart from scars as mentioned above  Brief Hospital Course:  Monica Kane is a 68 y.o. female presenting with typical chest pain of acute onset on evening of presentation with HEART score of 4 and TIMI of 3. PMH is significant for HTN, HLD, anxiety/depression.   Pt initially with substernal chest pain 8/10 with left sided radiation, assc left shoulder discomfort, jaw pain, exertional dyspnea, diaphoresis and nausea. Took 1 81mg  of ASA. Pt with HEART score of 4 (for moderately suspicious story, age, and HTN/HLD). Upon presentation to the ED pt's pain at 4/10 still substernal with left sided radiation, no numbness or tingling in left forearm. Pain improved to 1.5  upon receiving nitro. EKG without ST elevation or acute changes. istat trop neg. BP initially 180s/70s but improved after nitro (pt did not take evening medications). Trops cycled neg, repeat EKG showing qwaves in leads 1/L, statin continued, risk stratified with A1C, TSH, lipid panel (all wnl). Cont'd on coreg, added lisinopril for BP control with improvement. Cards consulted, decided to take pt for cath. No evidence of CAD on cath. 2D echo with EF 60%, mild LVH, no regional wall abnormalities. On discharge pt without chest pain, asymptomatic. Advised to f/up within 1 week with PCP Dr. Everlene Farrier       Issues for Follow Up:  1. Risk modification for HD 2. Improvement of BPs on lisinopril 10- needs repeat BMET  Significant Procedures:  LHC 07/23/13 1. Normal coronary arteries. There is a very short left main or perhaps a double ostial left system.  2. Normal left ventricular function   Significant Labs and Imaging:   Recent Labs Lab 07/22/13 2144 07/23/13 0350  WBC 4.8 5.9  HGB 13.0 12.5  HCT 39.5 38.3  PLT 192 162    Recent Labs Lab 07/22/13 2144 07/23/13 0350 07/23/13 1416  NA 141  --  145  K 4.0  --  4.1  CL 104  --  105  CO2 23  --  27  GLUCOSE 131*  --  96  BUN 19  --  13  CREATININE 0.65 0.72 0.67  CALCIUM 8.8  --  9.4  ALKPHOS  --   --  81  AST  --   --  18  ALT  --   --  14  ALBUMIN  --   --  3.6   A1C(5.9), TSH (2.15), lipid panel (TG 151, LDL 72)   Results/Tests Pending at Time of Discharge: None  Discharge Medications:    Medication List    STOP taking these medications       meloxicam 15 MG tablet  Commonly known as:  MOBIC      TAKE these medications       amitriptyline 25 MG tablet  Commonly known as:  ELAVIL  Take one at bedtime     aspirin 81 MG tablet  Take 81 mg by mouth daily.     carvedilol 3.125 MG tablet  Commonly known as:  COREG  Take 1 tablet (3.125 mg total) by mouth 2 (two) times daily with a meal.     cholecalciferol 1000 UNITS  tablet  Commonly known as:  VITAMIN D  Take 1,000 Units by mouth 2 (two) times daily.     lisinopril 10 MG tablet  Commonly known as:  PRINIVIL,ZESTRIL  Take 1 tablet (10 mg total) by mouth daily.     nitroGLYCERIN 0.4 MG SL tablet  Commonly known as:  NITROSTAT  Place 1 tablet (0.4 mg total) under the tongue every 5 (five) minutes as needed for chest pain.     rosuvastatin 10 MG tablet  Commonly known as:  CRESTOR  Take 1 tablet (10 mg total) by mouth at bedtime.     sertraline 100 MG tablet  Commonly known as:  ZOLOFT  Take 250 mg by mouth daily.     traZODone 50 MG tablet  Commonly known as:  DESYREL  Take 25-50 mg by mouth at bedtime.        Discharge Instructions: Please refer to Patient Instructions section of EMR for full details.  Patient was counseled important signs and symptoms that should prompt return to medical care, changes in medications, dietary instructions, activity restrictions, and follow up appointments.   Follow-Up Appointments: Follow-up Information   Schedule an appointment as soon as possible for a visit with DAUB, STEVE A, MD. (within 1 week for post hospital f/up)    Specialty:  Family Medicine   Contact information:   [place] [address] [phone]       Langston Masker, MD 07/24/2013, 1:11 PM PGY-1, [place]

## 2013-07-23 NOTE — Discharge Instructions (Signed)
Monica Kane you were seen in the hospital for chest pain. Given your story and EKG findings the cardiologists thought it was important to get a better view of your coronary arteries to make sure there were no blockages. Your coronary arteries were normal, and your echocardiogram showed only "mild left ventricular hypertrophy" (also called LVH, which is a thickening of the heart muscle, in your case probably due to elevated blood pressure over a long period of time).   It will be important going forward for you to continue taking your blood pressure medications (including lisinopril 10mg ), your cholesterol med, and your baby aspirin. We have also prescribed you some nitroglycerin should you feel this discomfort again. If you do not get immediate relief from the nitro please call 911. Please call your PCP to schedule post hospital follow up to optimize your cardiac risk factors.   SEEK MEDICAL CARE IF:   You think you are having problems from the medicine you are taking. Read your medicine instructions carefully.  Your chest pain does not go away, even after treatment.  You develop a rash with blisters on your chest. SEEK IMMEDIATE MEDICAL CARE IF:   You have increased chest pain or pain that spreads to your arm, neck, jaw, back, or abdomen.  You develop shortness of breath, an increasing cough, or you are coughing up blood.  You have severe back or abdominal pain, feel nauseous, or vomit.  You develop severe weakness, fainting, or chills.  You have a fever. THIS IS AN EMERGENCY. Do not wait to see if the pain will go away. Get medical help at once. Call your local emergency services (911 in [place] Do not drive yourself to the hospital.

## 2013-07-23 NOTE — Interval H&P Note (Signed)
Cath Lab Visit (complete for each Cath Lab visit)  Clinical Evaluation Leading to the Procedure:   ACS: yes  Non-ACS:    Anginal Classification: CCS IV  Anti-ischemic medical therapy: Minimal Therapy (1 class of medications)  Non-Invasive Test Results: No non-invasive testing performed  Prior CABG: No previous CABG      History and Physical Interval Note:  07/23/2013 9:16 AM  Monica Kane  has presented today for surgery, with the diagnosis of cp  The various methods of treatment have been discussed with the patient and family. After consideration of risks, benefits and other options for treatment, the patient has consented to  Procedure(s): LEFT HEART CATHETERIZATION WITH CORONARY ANGIOGRAM (N/A) as a surgical intervention .  The patient's history has been reviewed, patient examined, no change in status, stable for surgery.  I have reviewed the patient's chart and labs.  Questions were answered to the patient's satisfaction.     Sinclair Grooms

## 2013-07-23 NOTE — Progress Notes (Signed)
I have seen and examined this patient. I have discussed with Dr Marsh.  I agree with their findings and plans as documented in their progress note.    

## 2013-07-23 NOTE — H&P (Signed)
Call Pager [phone] for any questions or notifications regarding this patient  [facility] Attending Admission Note: Emrys Mceachron MD Attending pager:[phone] I  have seen and examined this patient, reviewed their chart. I have discussed this patient with the resident. I agree with the resident's findings, assessment and care plan. 

## 2013-07-23 NOTE — ED Notes (Signed)
Family medicine completed intake assessment.  Awaiting orders to admit pt to floor.

## 2013-07-23 NOTE — H&P (Signed)
[facility] Admission History and Physical Service Pager: [phone]  Patient name: Monica Kane Medical record number: [id number] Date of birth: [birth year] Age: 68 y.o. Gender: female  Primary Care Provider: Jenny Reichmann, MD Consultants: cards to see in the am Code Status: Full  Chief Complaint: chest pain  Assessment and Plan: Monica Kane is a 68 y.o. female presenting with typical chest pain of acute onset this evening with HEART score of 4 and Timi of 3. PMH is significant for HTN, HLD, anxiety/depression.   #Chest pain- DDX includes ACS vs angina vs hypertensive urgency vs GER vs pancreatitis vs dissection. Pt with HEART score of 4 (for moderately suspicious story, age, and HTN/HLD); features of typical chest pain given hx and improvement with 81ASA and nitrox2. Risk factors include HTN, HLD, and possible family history. Otherwise pt without sig cardiac hx or prior complaint of cp. Trops EKG thus far unremarkable. Did have elevated BP on admission, with possible component of hypertensive urgency however quickly improved with medication and would not explain other features of presentation. Denies any features of GER, no hx of pancreatitis, pulse equal and symmetric bilat. Nml CXR -admit to 3W observation for chest pain work-up -cycle trops -repeat EKG in am -nitro prn, ASA -o2 [place] as needed -cont statin (formularly substitute lipitor 20) -cardiology to see in the morning -A1C, TSH, lipid panel  #HTN- initially elevated 180s/70s improved with nitro, had not yet had evening BP medication; -coreg 3.125 BID as HR will allow (home medication) -VS per floor protocol -will need closer titration as an outpt  #Migraines- currently with headache; likely related to the above + addition of nitro -tylenol prn -avoid NSAIDs -no indication for CT imaging at this time with normal neuro exam  #depression/anxiety- no acute concerns at this time -cont zoloft home  medication -trazodone prn for sleep  FEN/GI: HHD Prophylaxis: hepsq  Disposition: admit to obsv for chest pain r/o under Dr. Nori Riis  History of Present Illness: Monica Kane is a 68 y.o. female presenting with substernal chest pain since approx 7pm this evening. Per pt report took a long nap since about 4pm and upon awakening c/o 8/10 substernal cp beneath breast bone radiating to left shoulder and jaw. Mildly relieved by baby ASA. Associated with increased exertional dyspnea when walking and diaphoresis. Pt also c/o drooling and sensation of nausea.   Upon presentation to the ED pt's pain at 4/10 still substernal with left sided radiation, no numbness or tingling in left forearm. Pain improved to 1.5 upon receiving nitro. EKG without ST elevation or acute changes. istat trop neg. BP initially 180s/70s but improved after nitro (pt did not take evening medications). Has never had CP previously. Non smoker. Hx of HTN (sometimes BPs difficult to control per report), HLD (on crestor). Mother with questionable CHF? Has had a headache. But pt denies vision changes, worsening SOB, peripheral edema, GER, recent illness and has otherwise been well.  Review Of Systems: Per HPI with the following additions: Recently ill Dec 18-Jan 19th with flu and PNA Otherwise 12 point review of systems was performed and was unremarkable.  Patient Active Problem List   Diagnosis Date Noted  . Osteopenia 03/23/2013  . Kidney stones, calcium oxalate 03/23/2013  . Hypertension 03/31/2011  . Hyperlipidemia 03/31/2011  . Vitamin d deficiency 03/31/2011  . Hyperglycemia 03/31/2011  . Foot pain, left 03/24/2011  . Hallux rigidus 03/24/2011  . Metatarsalgia of left foot 03/24/2011  . Lateral epicondylitis of  left elbow 02/24/2011  . Migraine 02/24/2011  . Depression 02/24/2011  . Cataract 02/24/2011  . Arthritis 02/24/2011   Past Medical History: Past Medical History  Diagnosis Date  . Hypertension   . Depression    . Migraine   . Hyperlipidemia   . Cataract   . Chronic kidney disease   . Anxiety    Past Surgical History: Past Surgical History  Procedure Laterality Date  . Abdominal hysterectomy  1989  . Lateral release right elbow  2010  . Shoulder arthroscopy w/ rotator cuff repair  2006  . Cervical spine surgery  2004    C 5-6  . Parotid gland tumor excision  1976    benign  . Diagnostic laparoscopy  1976    fibroid removed  . Appendectomy    . Colonoscopy    . Cervical fusion  5/12  . Shoulder arthroscopy distal clavicle excision and open rotator cuff repair  2006  . Elbow arthroscopy  11/10  . Parotid gland tumor excision  1976    rt  . Lateral epicondyle release  03/01/2011    Procedure: TENNIS ELBOW RELEASE;  Surgeon: Lorn Junes, MD;  Location: [place];  Service: Orthopedics;  Laterality: Left;  TENOTOMY ELBOW LATERAL EPICONDYLITIS TENNIS ELBOW  . Eye surgery     Social History: History  Substance Use Topics  . Smoking status: Never Smoker   . Smokeless tobacco: Never Used  . Alcohol Use: 0.0 oz/week    2-3 Glasses of wine per week   Additional social history: occasional drinker of wine; denies illicits Please also refer to relevant sections of EMR.  Family History: additionally mother with CHF? Family History  Problem Relation Age of Onset  . Cancer Mother     lung  . Cancer Father     lung; + tobacco  . Heart disease Maternal Grandfather     heart attack  . Heart disease Paternal Grandfather     heart attack  . Diabetes Sister   . Hypertension Sister   . Hyperlipidemia Sister    Allergies and Medications: Allergies  Allergen Reactions  . Penicillins Swelling and Rash   No current facility-administered medications on file prior to encounter.   Current Outpatient Prescriptions on File Prior to Encounter  Medication Sig Dispense Refill  . amitriptyline (ELAVIL) 25 MG tablet Take one at bedtime  90 tablet  3  . carvedilol (COREG)  3.125 MG tablet Take 1 tablet (3.125 mg total) by mouth 2 (two) times daily with a meal.  180 tablet  3  . cholecalciferol (VITAMIN D) 1000 UNITS tablet Take 1,000 Units by mouth 2 (two) times daily.       . meloxicam (MOBIC) 15 MG tablet Take 7.5-15 mg by mouth at bedtime.       . rosuvastatin (CRESTOR) 10 MG tablet Take 1 tablet (10 mg total) by mouth at bedtime.  90 tablet  3  . sertraline (ZOLOFT) 100 MG tablet Take 250 mg by mouth daily.       . traZODone (DESYREL) 50 MG tablet Take 25-50 mg by mouth at bedtime.         Objective: BP 153/72  Pulse 60  Temp(Src) 98.9 F (37.2 C) (Oral)  Resp 14  SpO2 96% Exam: Gen: NAD, alert, cooperative with exam, resting comfortably in bed  HEENT: NCAT, EOMI, PERRL, TMs nml; head mildly tender in the occiput on palpation Neck: FROM, supple; scar x2 from previous surg  CV: RRR, good  S1/S2, no murmur, cap refill <3 Resp: CTABL, no wheezes, non-labored Abd: SNTND, BS present, no guarding or organomegaly Ext: No edema, warm, normal tone, moves UE/LE spontaneously; strength 5/5 throughout upper and lower apart from testing of hip flexor on right side (limited 2/2 previous knee surgery) Neuro: Alert and oriented, CNii-xii tested an intact; FTN, rapid alternating movements wnl; no clonus Skin: no rashes no lesions   Labs and Imaging: CBC BMET   Recent Labs Lab 07/22/13 2144  WBC 4.8  HGB 13.0  HCT 39.5  PLT 192    Recent Labs Lab 07/22/13 2144  NA 141  K 4.0  CL 104  CO2 23  BUN 19  CREATININE 0.65  GLUCOSE 131*  CALCIUM 8.8     EKG nml istat trop neg X1  Langston Masker, MD 07/23/2013, 12:11 AM PGY-1, [place] Intern pager: [phone], text pages welcome  Howard Pouch DO PGY-2

## 2013-07-23 NOTE — Consult Note (Addendum)
CARDIOLOGY CONSULT NOTE   Patient ID: Monica Kane MRN: [id number] DOB/AGE: 04-29-1945 68 y.o.  Admit Date: 07/22/2013  Primary Physician: Jenny Reichmann, MD  Primary Cardiologist   Rodney Langton   Clinical Summary Monica Kane is a 68 y.o.female. She is admitted with chest discomfort and jaw discomfort with nausea and sweating. There is no prior cardiac history. She is very reliable patient. She is very knowledgeable of coronary disease as her husband has had significant disease over time and has done very well. She woke from a nap feeling very significant discomfort. Which came in the hospital it was resolving. However she was given nitroglycerin and immediately had further relief. So far her enzymes are negative. Her first EKG revealed no significant abnormality. Second EKG reveals Q waves in leads 1 and L. I'm not convinced that this is real change but it is concerning. She has hypertension and hyperlipidemia. There is question of family history.   Allergies  Allergen Reactions  . Penicillins Swelling and Rash    Medications Scheduled Medications: . aspirin  81 mg Oral Daily  . atorvastatin  20 mg Oral q1800  . carvedilol  3.125 mg Oral BID WC  . cholecalciferol  1,000 Units Oral BID  . heparin  5,000 Units Subcutaneous 3 times per day  . lisinopril  10 mg Oral Daily  . sertraline  250 mg Oral Daily  . traZODone  25-50 mg Oral QHS     Infusions:     PRN Medications:  acetaminophen, nitroGLYCERIN   Past Medical History  Diagnosis Date  . Hypertension   . Depression   . Migraine   . Hyperlipidemia   . Cataract   . Chronic kidney disease   . Anxiety     Past Surgical History  Procedure Laterality Date  . Abdominal hysterectomy  1989  . Lateral release right elbow  2010  . Shoulder arthroscopy w/ rotator cuff repair  2006  . Cervical spine surgery  2004    C 5-6  . Parotid gland tumor excision  1976    benign  . Diagnostic laparoscopy  1976   fibroid removed  . Appendectomy    . Colonoscopy    . Cervical fusion  5/12  . Shoulder arthroscopy distal clavicle excision and open rotator cuff repair  2006  . Elbow arthroscopy  11/10  . Parotid gland tumor excision  1976    rt  . Lateral epicondyle release  03/01/2011    Procedure: TENNIS ELBOW RELEASE;  Surgeon: Lorn Junes, MD;  Location: [place];  Service: Orthopedics;  Laterality: Left;  TENOTOMY ELBOW LATERAL EPICONDYLITIS TENNIS ELBOW  . Eye surgery      Family History  Problem Relation Age of Onset  . Cancer Mother     lung  . Cancer Father     lung; + tobacco  . Heart disease Maternal Grandfather     heart attack  . Heart disease Paternal Grandfather     heart attack  . Diabetes Sister   . Hypertension Sister   . Hyperlipidemia Sister     Social History Monica Kane reports that she has never smoked. She has never used smokeless tobacco. Monica Kane reports that she drinks alcohol.  Review of Systems   Patient denies fever, chills, headache, sweats, rash, change in vision, change in hearing, cough, nausea vomiting, urinary symptoms. All other systems are reviewed and are negative.  Physical Examination Blood pressure 174/66, pulse  59, temperature 98.1 F (36.7 C), temperature source Oral, resp. rate 14, height 5\' 5"  (1.651 m), weight 152 lb 1.6 oz (68.992 kg), SpO2 94.00%. No intake or output data in the 24 hours ending 07/23/13 0836  Patient is oriented to person time and place. Affect is normal. There is no jugulovenous distention. Lungs are clear. Respiratory effort is nonlabored. Cardiac exam reveals S1 and S2. There no clicks or significant murmurs. Abdomen is soft. There is no peripheral edema.   Prior Cardiac Testing/Procedures  Lab Results  Basic Metabolic Panel:  Recent Labs Lab 07/22/13 2144 07/23/13 0350  NA 141  --   K 4.0  --   CL 104  --   CO2 23  --   GLUCOSE 131*  --   BUN 19  --   CREATININE 0.65 0.72    CALCIUM 8.8  --     Liver Function Tests: No results found for this basename: AST, ALT, ALKPHOS, BILITOT, PROT, ALBUMIN,  in the last 168 hours  CBC:  Recent Labs Lab 07/22/13 2144 07/23/13 0350  WBC 4.8 5.9  HGB 13.0 12.5  HCT 39.5 38.3  MCV 80.1 80.8  PLT 192 162    Cardiac Enzymes:  Recent Labs Lab 07/23/13 0350  TROPONINI <0.30    BNP: No components found with this basename: POCBNP,    Radiology: Dg Chest 2 View  07/22/2013   CLINICAL DATA:  Chest pain left-sided.  EXAM: CHEST  2 VIEW  COMPARISON:  None.  FINDINGS: Lungs are adequately inflated without focal consolidation or effusion. The cardiomediastinal silhouette is within normal. There is a suggestion of several calcified mediastinal nodes. There is minimal spondylosis of the spine. Fusion hardware is present of the cervical spine.  IMPRESSION: No active cardiopulmonary disease.   Electronically Signed   By: Marin Olp M.D.   On: 07/22/2013 22:24    ECG:   Initial EKG showed no significant abnormality. Second EKG reveals Q waves in leads 1 and L. I am not convinced this a real change but it is concerning.    Impression and Recommendations    Hypertension     Blood pressure was high with admission. It is improving at this time. She will need careful attention to her blood pressure on a continuing basis as an outpatient.    Hyperlipidemia      Patient is on medications for her lipids.    Chest pain, midsternal     The patient's chest discomfort is concerning. There is no definite proof of myocardial injury at this time. I doubt that her symptoms represent aortic dissection. It is possible that she could have had an episode of severe esophageal spasm. She does have significant risk factors for coronary disease. Her symptoms were very concerning. There is suggestion of some nitroglycerin relief. I carefully considered whether we should proceed with in-hospital stress testing or cardiac catheterization.  Considering her presentation and all other factors, I feel it is most appropriate to proceed with diagnostic catheterization. I've explained this to her and she is in agreement. I've spoken with Dr. Tamala Julian who will be doing her procedure. I feel that she should have a two-dimensional echo also while she is here in the hospital. I've also ordered a 2-D echo to be done this morning. This will help assess LV function and make sure she does not have any other unusual abnormalities. She has not had an echo before that I can document.  Daryel November, MD  07/23/2013, 8:36 AM

## 2013-07-23 NOTE — Progress Notes (Signed)
Family Medicine Teaching Service Daily Progress Note Intern Pager: [phone]  Patient name: Monica Kane Medical record number: [id number] Date of birth: 1946/01/26 Age: 68 y.o. Gender: female  Primary Care Provider: Jenny Reichmann, MD Consultants: cards in am Code Status: full  Pt Overview and Major Events to Date:  4/5 admitted for CP work up  Assessment and Plan: ALINE WESCHE is a 68 y.o. female presenting with typical chest pain of acute onset this evening with HEART score of 4 and [place] of 3. PMH is significant for HTN, HLD, anxiety/depression.   #Chest pain- DDX includes ACS vs hypertensive urgency vs GER vs pancreatitis. Pt with HEART score of 4 (for moderately suspicious story, age, and HTN/HLD); features of typical chest pain given hx and improvement with 81ASA and nitrox2. Risk factors include HTN, HLD, and possible family history. Otherwise pt without sig cardiac hx or prior complaint of cp. Trops neg thus far. Initial EKG in NSR but repeat with qwaves in 1 and L.  -cycle trops  -cardiac cath this morning -nitro prn, ASA  -o2 [place] as needed  -cont statin (formularly substitute lipitor 20)  -cardiology evaluation in process, pending cath -A1C (pending), TSH (2.15), lipid panel (TG 151, LDL 72) -2D echo for possible anginal component  #HTN- over goal this morning 164/66 -coreg 3.125 BID as HR will allow (home medication)  -adding lisinopril 10mg  daily -VS per floor protocol  -will need closer titration as an outpt   #Migraines- no headache this morning s/p nitros yesterday -tylenol prn  -avoid NSAIDs  -no indication for CT imaging at this time with normal neuro exam   #depression/anxiety- no acute concerns at this time  -cont zoloft home medication  -trazodone prn for sleep   FEN/GI: HHD  Prophylaxis: hepsq  Disposition: continued work up of new onset chest pain, cardiology recommendations, cath today  Subjective: Asymptomatic this morning; no CP, palps, SOB,  dizziness, headache, nausea, vision changes; was able to sleep for about an hour  Objective: Temp:  [98.9 F (37.2 C)] 98.9 F (37.2 C) (04/05 2129) Pulse Rate:  [54-66] 54 (04/06 0130) Resp:  [12-16] 13 (04/06 0130) BP: (153-194)/(66-92) 164/66 mmHg (04/06 0130) SpO2:  [95 %-99 %] 98 % (04/06 0130) Physical Exam: Gen: NAD, alert, cooperative with exam, resting comfortably in bed  HEENT: NCAT, EOMI, PERRL Neck: FROM, supple; scar x2 from previous surg  CV: RRR, good S1/S2, no murmur, cap refill <3  Resp: CTABL, no wheezes, non-labored  Abd: SNTND, BS present, no guarding or organomegaly  Ext: No edema, warm, normal tone, moves UE/LE spontaneously Neuro: Alert and oriented, grossly intact Skin: no rashes no lesions apart from scars as mentioned above   Laboratory:  Recent Labs Lab 07/22/13 2144  WBC 4.8  HGB 13.0  HCT 39.5  PLT 192    Recent Labs Lab 07/22/13 2144  NA 141  K 4.0  CL 104  CO2 23  BUN 19  CREATININE 0.65  CALCIUM 8.8  GLUCOSE 131*   Trop neg X1  Imaging/Diagnostic Tests: EKG 1: NSR EKG 2: q waves in L and 1  Langston Masker, MD 07/23/2013, 1:57 AM PGY-1, [place] Intern pager: [phone], text pages welcome

## 2013-07-23 NOTE — Progress Notes (Signed)
Pt being discharged, Cath lab was called to check Right Radial cath site before discharge. Dr. Brien Few notified as well. Ok to proceed with discharge. Will continue to monitor.

## 2013-07-23 NOTE — Progress Notes (Signed)
TR band removed, radial pulse present , VS WNL. Will continue to monitor.

## 2013-07-23 NOTE — CV Procedure (Signed)
     Left Heart Catheterization with Coronary Angiography  Report  Monica Kane  68 y.o.  female 09-18-1945  Procedure Date: 07/23/2013 Referring Physician: Dola Argyle, MD Primary Cardiologist:: Ron Parker, MD  INDICATIONS: Jaw and chest pain, with concern for CAd  PROCEDURE: 1. Left heart; 2. Coronary angiography; 3. LV gram  CONSENT:  The risks, benefits, and details of the procedure were explained in detail to the patient. Risks including death, stroke, heart attack, kidney injury, allergy, limb ischemia, bleeding and radiation injury were discussed.  The patient verbalized understanding and wanted to proceed.  Informed written consent was obtained.  PROCEDURE TECHNIQUE:  After Xylocaine anesthesia a 81F Slender sheath was placed in the right radial artery with an angiocath and the modified Seldinger technique.  Coronary angiography was done using a 5 F JR4 and JL4 diagnostic catheters.  Left ventriculography was done using the JR4 catheter and hand injection.   A Wrist Band was used to achieve hemostasis.   CONTRAST:  Total of 80 cc.  COMPLICATIONS:  none   HEMODYNAMICS:  Aortic pressure 150/70 mmHg; LV pressure 155/14 mmHg; LVEDP 14 mmHg  ANGIOGRAPHIC DATA:   The left main coronary artery is short and without obstruction. His appearance of a double ostial system.  The left anterior descending artery is large and widely patent.  The left circumflex artery is large, widely patent, and dominant.  The right coronary artery is nondominant.   LEFT VENTRICULOGRAM:  Left ventricular angiogram was done in the 30 RAO projection and revealed normal cavity size and hyperdynamic wall motion. EF 70%   IMPRESSIONS:  1. Normal coronary arteries. There is a very short left main or perhaps a double ostial left system.  2. Normal left ventricular function   RECOMMENDATION:  No further cardiac evaluation for ischemia/CAD.

## 2013-07-23 NOTE — ED Notes (Signed)
Report to Denise RN

## 2013-07-23 NOTE — H&P (View-Only) (Signed)
CARDIOLOGY CONSULT NOTE   Patient ID: Monica Kane MRN: [id number] DOB/AGE: 04-29-1945 68 y.o.  Admit Date: 07/22/2013  Primary Physician: Jenny Reichmann, MD  Primary Cardiologist   Rodney Langton   Clinical Summary Monica Kane is a 68 y.o.female. She is admitted with chest discomfort and jaw discomfort with nausea and sweating. There is no prior cardiac history. She is very reliable patient. She is very knowledgeable of coronary disease as her husband has had significant disease over time and has done very well. She woke from a nap feeling very significant discomfort. Which came in the hospital it was resolving. However she was given nitroglycerin and immediately had further relief. So far her enzymes are negative. Her first EKG revealed no significant abnormality. Second EKG reveals Q waves in leads 1 and L. I'm not convinced that this is real change but it is concerning. She has hypertension and hyperlipidemia. There is question of family history.   Allergies  Allergen Reactions  . Penicillins Swelling and Rash    Medications Scheduled Medications: . aspirin  81 mg Oral Daily  . atorvastatin  20 mg Oral q1800  . carvedilol  3.125 mg Oral BID WC  . cholecalciferol  1,000 Units Oral BID  . heparin  5,000 Units Subcutaneous 3 times per day  . lisinopril  10 mg Oral Daily  . sertraline  250 mg Oral Daily  . traZODone  25-50 mg Oral QHS     Infusions:     PRN Medications:  acetaminophen, nitroGLYCERIN   Past Medical History  Diagnosis Date  . Hypertension   . Depression   . Migraine   . Hyperlipidemia   . Cataract   . Chronic kidney disease   . Anxiety     Past Surgical History  Procedure Laterality Date  . Abdominal hysterectomy  1989  . Lateral release right elbow  2010  . Shoulder arthroscopy w/ rotator cuff repair  2006  . Cervical spine surgery  2004    C 5-6  . Parotid gland tumor excision  1976    benign  . Diagnostic laparoscopy  1976   fibroid removed  . Appendectomy    . Colonoscopy    . Cervical fusion  5/12  . Shoulder arthroscopy distal clavicle excision and open rotator cuff repair  2006  . Elbow arthroscopy  11/10  . Parotid gland tumor excision  1976    rt  . Lateral epicondyle release  03/01/2011    Procedure: TENNIS ELBOW RELEASE;  Surgeon: Lorn Junes, MD;  Location: [facility];  Service: Orthopedics;  Laterality: Left;  TENOTOMY ELBOW LATERAL EPICONDYLITIS TENNIS ELBOW  . Eye surgery      Family History  Problem Relation Age of Onset  . Cancer Mother     lung  . Cancer Father     lung; + tobacco  . Heart disease Maternal Grandfather     heart attack  . Heart disease Paternal Grandfather     heart attack  . Diabetes Sister   . Hypertension Sister   . Hyperlipidemia Sister     Social History Monica Kane reports that she has never smoked. She has never used smokeless tobacco. Monica Kane reports that she drinks alcohol.  Review of Systems   Patient denies fever, chills, headache, sweats, rash, change in vision, change in hearing, cough, nausea vomiting, urinary symptoms. All other systems are reviewed and are negative.  Physical Examination Blood pressure 174/66, pulse  59, temperature 98.1 F (36.7 C), temperature source Oral, resp. rate 14, height 5\' 5"  (1.651 m), weight 152 lb 1.6 oz (68.992 kg), SpO2 94.00%. No intake or output data in the 24 hours ending 07/23/13 0836  Patient is oriented to person time and place. Affect is normal. There is no jugulovenous distention. Lungs are clear. Respiratory effort is nonlabored. Cardiac exam reveals S1 and S2. There no clicks or significant murmurs. Abdomen is soft. There is no peripheral edema.   Prior Cardiac Testing/Procedures  Lab Results  Basic Metabolic Panel:  Recent Labs Lab 07/22/13 2144 07/23/13 0350  NA 141  --   K 4.0  --   CL 104  --   CO2 23  --   GLUCOSE 131*  --   BUN 19  --   CREATININE 0.65 0.72    CALCIUM 8.8  --     Liver Function Tests: No results found for this basename: AST, ALT, ALKPHOS, BILITOT, PROT, ALBUMIN,  in the last 168 hours  CBC:  Recent Labs Lab 07/22/13 2144 07/23/13 0350  WBC 4.8 5.9  HGB 13.0 12.5  HCT 39.5 38.3  MCV 80.1 80.8  PLT 192 162    Cardiac Enzymes:  Recent Labs Lab 07/23/13 0350  TROPONINI <0.30    BNP: No components found with this basename: POCBNP,    Radiology: Dg Chest 2 View  07/22/2013   CLINICAL DATA:  Chest pain left-sided.  EXAM: CHEST  2 VIEW  COMPARISON:  None.  FINDINGS: Lungs are adequately inflated without focal consolidation or effusion. The cardiomediastinal silhouette is within normal. There is a suggestion of several calcified mediastinal nodes. There is minimal spondylosis of the spine. Fusion hardware is present of the cervical spine.  IMPRESSION: No active cardiopulmonary disease.   Electronically Signed   By: Marin Olp M.D.   On: 07/22/2013 22:24    ECG:   Initial EKG showed no significant abnormality. Second EKG reveals Q waves in leads 1 and L. I am not convinced this a real change but it is concerning.    Impression and Recommendations    Hypertension     Blood pressure was high with admission. It is improving at this time. She will need careful attention to her blood pressure on a continuing basis as an outpatient.    Hyperlipidemia      Patient is on medications for her lipids.    Chest pain, midsternal     The patient's chest discomfort is concerning. There is no definite proof of myocardial injury at this time. I doubt that her symptoms represent aortic dissection. It is possible that she could have had an episode of severe esophageal spasm. She does have significant risk factors for coronary disease. Her symptoms were very concerning. There is suggestion of some nitroglycerin relief. I carefully considered whether we should proceed with in-hospital stress testing or cardiac catheterization.  Considering her presentation and all other factors, I feel it is most appropriate to proceed with diagnostic catheterization. I've explained this to her and she is in agreement. I've spoken with Dr. Tamala Julian who will be doing her procedure. I feel that she should have a two-dimensional echo also while she is here in the hospital. I've also ordered a 2-D echo to be done this morning. This will help assess LV function and make sure she does not have any other unusual abnormalities. She has not had an echo before that I can document.  Daryel November, MD  07/23/2013, 8:36 AM

## 2013-07-23 NOTE — Progress Notes (Signed)
PGY-2 Interim Note  Pt seen after cath earlier this afternoon. Reports she feels well and would like to go home tonight. Denies chest pain, difficulty or concerns with her right wrist (access for cath). Reviewed cath results (no CAD identified) and echo (mild LVH but otherwise normal). Previously had discussed possible discharge on formal rounds earlier today.  Advised pt to f/u within the week with her PCP Dr. Everlene Farrier. Pt and husband expressed thanks and had no further questions. Will discuss discharge with RN.  Emmaline Kluver, MD PGY-2, [facility] 07/23/2013, 7:23 PM [facility] pager: [phone] (text pages welcome through [facility])

## 2013-07-25 NOTE — Discharge Summary (Signed)
I discussed with  Dr Skeet Simmer.  I agree with their plans documented in their discharge note for today.

## 2013-07-26 ENCOUNTER — Ambulatory Visit (INDEPENDENT_AMBULATORY_CARE_PROVIDER_SITE_OTHER): Payer: 59 | Admitting: Emergency Medicine

## 2013-07-26 VITALS — BP 110/70 | HR 61 | Temp 98.1°F | Resp 16 | Ht 64.0 in | Wt 159.0 lb

## 2013-07-26 DIAGNOSIS — R1011 Right upper quadrant pain: Secondary | ICD-10-CM

## 2013-07-26 NOTE — Progress Notes (Signed)
Subjective:    Patient ID: Monica Kane, female    DOB: [birth year], 68 y.o.   MRN: [id number]  HPI This chart was scribed for Monica Kane-MD, by Lovena Le Day, Scribe. This patient was seen in room 9 and the patient's care was started at 12:29 PM.  HPI Comments: Monica Kane is a 68 y.o. female who presents to the [facility] for follow up for a heart catheterization which was performed yesterday through her right radial artery. She is here today to make sure that she is healing well from this procedure. She reports that she feels fine but fatigued overall and with small amount of bruising, soreness and a small lump over the site of insertion for her cardiac catheterization; volar aspect right forearm. She states unsure as to what caused her symptoms initially. She had increase in her BP medicines.   This procedure occured after she brought herself to the ER yesterday. Yesterday while she was taking a nap at home, she suddenly awoke with burning CP. She then began feeling hot/flushed, diaphoretic and nauseated. Yesterday she also had an echocardiogram and EKG. So far she does not have a definitive explanation for her symptoms yesterday. She states that she feels improved and w/out any recurrence of those symptoms. Her echocardiogram displayed some mild thickening of the wall of her left ventricle which was cause for her increasing dose of BP medicines.   She also wants to check that she is clear for air travel tomorrow morning in which she is flying to [place].   Past Medical History  Diagnosis Date  . Hypertension   . Depression   . Migraine   . Hyperlipidemia   . Cataract   . Chronic kidney disease   . Anxiety     Allergies  Allergen Reactions  . Penicillins Swelling and Rash    No orders of the defined types were placed in this encounter.    Review of Systems  Constitutional: Positive for fatigue. Negative for fever and chills.  Respiratory: Negative for cough  and shortness of breath.   Cardiovascular: Negative for chest pain.  Gastrointestinal: Negative for abdominal pain.  Musculoskeletal: Negative for back pain.  Skin:       Bruising volar aspect right forearm      Objective:   Physical Exam  Nursing note and vitals reviewed. Constitutional: She is oriented to person, place, and time. She appears well-developed and well-nourished. No distress.  HENT:  Head: Normocephalic and atraumatic.  Eyes: Conjunctivae are normal. Right eye exhibits no discharge. Left eye exhibits no discharge.  Neck: Normal range of motion.  Cardiovascular: Normal rate.   Pulmonary/Chest: Effort normal. No respiratory distress.  Musculoskeletal: Normal range of motion. She exhibits no edema.  Neurological: She is alert and oriented to person, place, and time.  Skin: Skin is warm and dry.  Psychiatric: She has a normal mood and affect. Thought content normal.   examination of the right wrist reveals a small nodular area approximately 5 mm over the puncture site. She has normal sensation and good capillary fill to the index finger. There is bruising around the puncture site Triage Vitals: BP 110/70  Pulse 61  Temp(Src) 98.1 F (36.7 C) (Oral)  Resp 16  Ht 5\' 4"  (1.626 m)  Wt 159 lb (72.122 kg)  BMI 27.28 kg/m2  SpO2 95%     Assessment & Plan:  DIAGNOSTIC STUDIES: Oxygen Saturation is 95% on room air, adequate by my interpretation.  COORDINATION OF CARE: At 100 PM Discussed treatment plan with patient. Patient agrees.  She will continue off the Mobic. We'll check an ultrasound of the right upper abdomen. She will be scheduled to see Dr. Bonnita Nasuti at [facility] for further evaluation of her episodes of chest pain since her cath showed nonobstructive coronary disease I personally performed the services described in this documentation, which was scribed in my presence. The recorded information has been reviewed and is accurate.

## 2013-07-31 ENCOUNTER — Ambulatory Visit
Admission: RE | Admit: 2013-07-31 | Discharge: 2013-07-31 | Disposition: A | Discharge status: Home / Self Care | Payer: 59 | Source: Ambulatory Visit | Attending: Emergency Medicine | Admitting: Emergency Medicine

## 2013-07-31 DIAGNOSIS — R1011 Right upper quadrant pain: Secondary | ICD-10-CM

## 2013-08-21 ENCOUNTER — Other Ambulatory Visit: Payer: Self-pay | Admitting: Pediatrics

## 2013-08-22 ENCOUNTER — Other Ambulatory Visit: Payer: Self-pay | Admitting: Emergency Medicine

## 2013-08-31 ENCOUNTER — Encounter: Payer: Self-pay | Admitting: Emergency Medicine

## 2013-09-03 ENCOUNTER — Telehealth: Payer: Self-pay

## 2013-09-03 NOTE — Telephone Encounter (Signed)
Dr. Ron Parker faxed over a form for this pt for [place] to sign and fax back so that he can treat this pt's OSA. Chelle signed it, faxed and placed in scan pile.

## 2013-11-17 DIAGNOSIS — Z8619 Personal history of other infectious and parasitic diseases: Secondary | ICD-10-CM

## 2013-11-17 HISTORY — DX: Personal history of other infectious and parasitic diseases: Z86.19

## 2013-12-01 ENCOUNTER — Telehealth: Payer: Self-pay

## 2013-12-01 NOTE — Telephone Encounter (Signed)
Can we make an appt for this pt for Dr. Everlene Farrier for first available per Dr. Everlene Farrier. Thanks

## 2013-12-05 ENCOUNTER — Ambulatory Visit (INDEPENDENT_AMBULATORY_CARE_PROVIDER_SITE_OTHER): Payer: 59 | Admitting: Emergency Medicine

## 2013-12-05 VITALS — BP 140/80 | HR 69 | Temp 98.0°F | Resp 16 | Ht 65.0 in | Wt 160.0 lb

## 2013-12-05 DIAGNOSIS — L719 Rosacea, unspecified: Secondary | ICD-10-CM

## 2013-12-05 DIAGNOSIS — J3489 Other specified disorders of nose and nasal sinuses: Secondary | ICD-10-CM

## 2013-12-05 MED ORDER — DOXYCYCLINE HYCLATE 100 MG PO TABS
100.0000 mg | ORAL_TABLET | Freq: Two times a day (BID) | ORAL | Status: DC
Start: 2013-12-05 — End: 2014-01-14

## 2013-12-05 MED ORDER — MUPIROCIN 2 % EX OINT: 1.0000 "application " | TOPICAL_OINTMENT | Freq: Two times a day (BID) | CUTANEOUS | Status: DC

## 2013-12-05 NOTE — Telephone Encounter (Signed)
Pt came in to see Dr. Everlene Farrier this morning.

## 2013-12-05 NOTE — Telephone Encounter (Signed)
Patient called wanting to know if Dr. Everlene Farrier needed to see her prior to her total knee replacement surgery she is having with Dr. Noemi Chapel. Patient stated her surgery was moved from September 28 to August 31 and she needed to have medical clerance form Dr. Everlene Farrier. I let patient know there was a note by the nurse about patient needing to come in to see Dr. Everlene Farrier but the note didn't specify for what. Patient stated she would go ahead and come to the walk in clinic this morning  to make sure she got her medical clearance for this surgery. Patients call back number is [phone]

## 2013-12-05 NOTE — Progress Notes (Signed)
Subjective:  This chart was scribed for Monica Russian, MD by Ladene Artist, ED Scribe. The patient was seen in room 9. Patient's care was started at 10:58 AM.   Patient ID: Monica Kane, female    DOB: Sep 11, 1945, 68 y.o.   MRN: [id number]  Chief Complaint  Patient presents with  . Medical Clearance    Right knee joint replacement surgery scheduled 12/17/2013   HPI HPI Comments: Monica Kane is a 68 y.o. female who presents to the [facility] for medical clearance for R knee joint replacement surgery scheduled for 12/17/13 by Elsie Saas. Pt had catheretization in 07/2013 which was normal. She denies issues from anesthesia. No h/o blood clot. Pt wears a mouth appliance at night to relieve snoring. Pt states that she is ready to have her procedure. She does not get annual flu shot but she plans to start since she was diagnosed with the flu this year.   Pt also reports L nostril soreness that she suspects is from a nasal sore. Pt states that she wants to have this area checked prior to surgery. Allergy to PCN.   Past Medical History  Diagnosis Date  . Hypertension   . Depression   . Migraine   . Hyperlipidemia   . Cataract   . Chronic kidney disease   . Anxiety    Current Outpatient Prescriptions on File Prior to Visit  Medication Sig Dispense Refill  . amitriptyline (ELAVIL) 25 MG tablet Take one at bedtime  90 tablet  3  . aspirin 81 MG tablet Take 81 mg by mouth daily.      . carvedilol (COREG) 3.125 MG tablet Take 1 tablet (3.125 mg total) by mouth 2 (two) times daily with a meal.  180 tablet  3  . cholecalciferol (VITAMIN D) 1000 UNITS tablet Take 1,000 Units by mouth 2 (two) times daily.       Marland Kitchen lisinopril (PRINIVIL,ZESTRIL) 10 MG tablet TAKE 1 TABLET DAILY.  30 tablet  4  . rosuvastatin (CRESTOR) 10 MG tablet Take 1 tablet (10 mg total) by mouth at bedtime.  90 tablet  3  . sertraline (ZOLOFT) 100 MG tablet Take 250 mg by mouth daily.       .  traZODone (DESYREL) 50 MG tablet Take 25-50 mg by mouth at bedtime.       . nitroGLYCERIN (NITROSTAT) 0.4 MG SL tablet Place 1 tablet (0.4 mg total) under the tongue every 5 (five) minutes as needed for chest pain.  14 tablet  0   No current facility-administered medications on file prior to visit.   Allergies  Allergen Reactions  . Penicillins Swelling and Rash   Review of Systems  Constitutional: Negative for fever, chills and fatigue.  HENT:       Nostril sore      Objective:   Physical Exam CONSTITUTIONAL: Well developed/well nourished HEAD: Normocephalic/atraumatic, redness over the nose with tenderness lateral R nares EYES: EOMI/PERRL ENMT: Mucous membranes moist  NECK: supple no meningeal signs SPINE:entire spine nontender CV: S1/S2 noted, no murmurs/rubs/gallops noted LUNGS: Lungs are clear to auscultation bilaterally, no apparent distress ABDOMEN: soft, nontender, no rebound or guarding GU:no cva tenderness NEURO: Pt is awake/alert, moves all extremitiesx4 EXTREMITIES: pulses normal, full ROM SKIN: warm, color normal PSYCH: no abnormalities of mood noted    Assessment & Plan:  Patient is cleared for surgery. She did have some redness of the nose. A culture was taken from her so  she'll be treated with 7 days of doxycycline and mupirocin ointment. She had a catheterization earlier in the year which was clear she has no history of adverse reactions to anesthesia she does snore at night and has a the oral prosthesis for this I personally performed the services described in this documentation, which was scribed in my presence. The recorded information has been reviewed and is accurate.

## 2013-12-08 LAB — WOUND CULTURE
GRAM STAIN: NONE SEEN
Gram Stain: NONE SEEN
Gram Stain: NONE SEEN

## 2013-12-09 ENCOUNTER — Encounter: Payer: Self-pay | Admitting: Family Medicine

## 2013-12-10 ENCOUNTER — Encounter (HOSPITAL_COMMUNITY): Payer: Self-pay | Admitting: Pharmacy Technician

## 2013-12-11 ENCOUNTER — Inpatient Hospital Stay (HOSPITAL_COMMUNITY)
Admission: RE | Admit: 2013-12-11 | Discharge: 2013-12-11 | Disposition: A | Discharge status: Home / Self Care | Payer: 59 | Source: Ambulatory Visit

## 2013-12-11 ENCOUNTER — Telehealth: Payer: Self-pay | Admitting: Emergency Medicine

## 2013-12-11 NOTE — Telephone Encounter (Signed)
She should be fine to swim.

## 2013-12-11 NOTE — Pre-Procedure Instructions (Signed)
Monica Kane  12/11/2013   Your procedure is scheduled on:  12-17-2013   Tuesday   Report to [facility] Admitting at 8:45 AM.  Call this number if you have problems the morning of surgery: [phone]   Remember:   Do not eat food or drink liquids after midnight.    Take these medicines the morning of surgery with A SIP OF WATER carvedilol)coreg),: mupirocin(bactroban),sertraline(zoloft)   Do not wear jewelry, make-up or nail polish.  Do not wear lotions, powders, or perfumes. You may not wear deodorant.  Do not shave 48 hours prior to surgery. Men may shave face and neck .  Do not bring valuables to the hospital.  [facility] is not responsible  for any belongings or valuables.               Contacts, dentures or bridgework may not be worn into surgery.   Leave suitcase in the car. After surgery it may be brought to your room.  For patients admitted to the hospital, discharge time is determined by your treatment team  .               .   .    Special Instructions: See attached sheet for instructions on CHG shower/bath   Please read over the following fact sheets that you were given: Pain Booklet, Coughing and Deep Breathing, Blood Transfusion Information and Surgical Site Infection Prevention

## 2013-12-11 NOTE — Telephone Encounter (Signed)
Patient states that she was treated for a staph infection in her nose recently and wants to know if it is OK to participate in her swim with this issue.   [phone]

## 2013-12-12 NOTE — Telephone Encounter (Signed)
Advised pt ok to swim

## 2013-12-17 ENCOUNTER — Encounter (HOSPITAL_COMMUNITY): Admission: RE | Payer: Self-pay | Source: Ambulatory Visit

## 2013-12-17 ENCOUNTER — Inpatient Hospital Stay (HOSPITAL_COMMUNITY): Admission: RE | Admit: 2013-12-17 | Payer: 59 | Source: Ambulatory Visit | Admitting: Orthopedic Surgery

## 2013-12-17 SURGERY — ARTHROPLASTY, KNEE, TOTAL
Anesthesia: General | Laterality: Right

## 2014-01-01 ENCOUNTER — Telehealth: Payer: Self-pay

## 2014-01-01 NOTE — Telephone Encounter (Signed)
Dr.Daub, Pt has questions about a nose swab for a staph infection. Best# [phone]

## 2014-01-02 ENCOUNTER — Ambulatory Visit (INDEPENDENT_AMBULATORY_CARE_PROVIDER_SITE_OTHER): Payer: 59 | Admitting: Emergency Medicine

## 2014-01-02 VITALS — BP 168/74 | HR 70 | Temp 97.9°F | Resp 16 | Ht 65.25 in | Wt 164.8 lb

## 2014-01-02 DIAGNOSIS — M1990 Unspecified osteoarthritis, unspecified site: Secondary | ICD-10-CM

## 2014-01-02 DIAGNOSIS — Z23 Encounter for immunization: Secondary | ICD-10-CM

## 2014-01-02 DIAGNOSIS — M129 Arthropathy, unspecified: Secondary | ICD-10-CM

## 2014-01-02 DIAGNOSIS — B958 Unspecified staphylococcus as the cause of diseases classified elsewhere: Secondary | ICD-10-CM

## 2014-01-02 NOTE — Progress Notes (Signed)
   Subjective:    Patient ID: RAYE WIENS, female    DOB: 1945-10-10, 68 y.o.   MRN: [id number]  HPI 68 year old female here for follow up on staph infection. Pt would like a culture sent off to make sure she does not have MRSA. She has an upcoming total knee replacement surgery; she has to be clear of infection for two weeks before they will operate. Pt's orthopedist is Dr. Dayna Ramus.   Pt is also requesting a flu shot.    Review of Systems     Objective:   Physical Exam Patient is alert and cooperative she is not ill-appearing. The nose is normal to examination. There is no tenderness throat is clear.       Assessment & Plan:  Patient with history of staph infection of the nose. She is scheduled for surgery with Dr. Noemi Chapel next month. Culture was done today of the nasopharynx.  Flu  Shot was also given today.Marland Kitchen

## 2014-01-02 NOTE — Telephone Encounter (Signed)
Spoke to pt and she was seen this morning to have the nasal swab done.

## 2014-01-04 ENCOUNTER — Other Ambulatory Visit (HOSPITAL_COMMUNITY): Payer: 59

## 2014-01-04 LAB — WOUND CULTURE
GRAM STAIN: NONE SEEN
Gram Stain: NONE SEEN
Organism ID, Bacteria: NO GROWTH

## 2014-01-14 ENCOUNTER — Encounter (HOSPITAL_COMMUNITY): Payer: Self-pay

## 2014-01-16 ENCOUNTER — Encounter (HOSPITAL_COMMUNITY): Payer: Self-pay | Admitting: Physician Assistant

## 2014-01-16 DIAGNOSIS — Z9842 Cataract extraction status, left eye: Secondary | ICD-10-CM

## 2014-01-16 DIAGNOSIS — F419 Anxiety disorder, unspecified: Secondary | ICD-10-CM | POA: Diagnosis present

## 2014-01-16 DIAGNOSIS — M1711 Unilateral primary osteoarthritis, right knee: Secondary | ICD-10-CM | POA: Diagnosis present

## 2014-01-16 NOTE — H&P (Signed)
TOTAL KNEE ADMISSION H&P  Patient is being admitted for right total knee arthroplasty.  Subjective:  Chief Complaint:right knee pain.  HPI: Monica Kane, 68 y.o. female, has a history of pain and functional disability in the right knee due to arthritis and has failed non-surgical conservative treatments for greater than 12 weeks to includeNSAID's and/or analgesics, corticosteriod injections, viscosupplementation injections, flexibility and strengthening excercises, supervised PT with diminished ADL's post treatment, use of assistive devices, weight reduction as appropriate and activity modification.  Onset of symptoms was gradual, starting 10 years ago with gradually worsening course since that time. The patient noted prior procedures on the knee to include  arthroscopy and menisectomy on the right knee(s).  Patient currently rates pain in the right knee(s) at 10 out of 10 with activity. Patient has night pain, worsening of pain with activity and weight bearing, pain that interferes with activities of daily living, crepitus and joint swelling.  Patient has evidence of subchondral sclerosis, joint subluxation and joint space narrowing by imaging studies.. There is no active infection.  Patient Active Problem List   Diagnosis Date Noted  . Lateral epicondylitis of left elbow 02/24/2011    Priority: High  . Migraine 02/24/2011    Priority: Low  . Cataract extraction status of right eye 02/24/2011    Priority: Low  . Arthritis 02/24/2011    Priority: Low  . Cataract extraction status of left eye 01/16/2014  . Primary localized osteoarthritis of right knee   . Anxiety   . Chest pain, midsternal 07/23/2013  . Osteopenia 03/23/2013  . Kidney stones, calcium oxalate 03/23/2013  . Hypertension 03/31/2011  . Hyperlipidemia 03/31/2011  . Vitamin d deficiency 03/31/2011  . Hyperglycemia 03/31/2011  . Foot pain, left 03/24/2011  . Hallux rigidus 03/24/2011  . Metatarsalgia of left foot  03/24/2011  . Depression 02/24/2011   Past Medical History  Diagnosis Date  . Hypertension   . Depression   . Migraine   . Hyperlipidemia   . Cataract   . Anxiety   . Primary localized osteoarthritis of right knee     Past Surgical History  Procedure Laterality Date  . Abdominal hysterectomy  1989  . Lateral release right elbow  2010  . Shoulder arthroscopy w/ rotator cuff repair  2006  . Cervical spine surgery  2004    C 5-6  . Parotid gland tumor excision  1976    benign  . Diagnostic laparoscopy  1976    fibroid removed  . Appendectomy    . Colonoscopy    . Cervical fusion  5/12  . Shoulder arthroscopy distal clavicle excision and open rotator cuff repair  2006  . Elbow arthroscopy  11/10  . Parotid gland tumor excision  1976    rt  . Lateral epicondyle release  03/01/2011    Procedure: TENNIS ELBOW RELEASE;  Surgeon: Lorn Junes, MD;  Location: [facility];  Service: Orthopedics;  Laterality: Left;  TENOTOMY ELBOW LATERAL EPICONDYLITIS TENNIS ELBOW  . Eye surgery    . Knee arthroscopy w/ meniscectomy Right 01/31/2013    [facility]    No prescriptions prior to admission   Allergies  Allergen Reactions  . Penicillins Swelling and Rash   No current facility-administered medications for this encounter. Current outpatient prescriptions:amitriptyline (ELAVIL) 25 MG tablet, Take 25 mg by mouth at bedtime., Disp: , Rfl: ;  carvedilol (COREG) 3.125 MG tablet, Take 1 tablet (3.125 mg total) by mouth 2 (two) times daily with a meal., Disp:  180 tablet, Rfl: 3;  cholecalciferol (VITAMIN D) 1000 UNITS tablet, Take 1,000 Units by mouth 2 (two) times daily. , Disp: , Rfl: ;  lisinopril (PRINIVIL,ZESTRIL) 10 MG tablet, Take 10 mg by mouth daily., Disp: , Rfl:  LORazepam (ATIVAN) 0.5 MG tablet, Take 0.5-1 mg by mouth at bedtime., Disp: , Rfl: ;  mupirocin ointment (BACTROBAN) 2 %, Place 1 application into the nose 2 (two) times daily., Disp: 22 g, Rfl: 0;  rosuvastatin  (CRESTOR) 10 MG tablet, Take 1 tablet (10 mg total) by mouth at bedtime., Disp: 90 tablet, Rfl: 3;  sertraline (ZOLOFT) 100 MG tablet, Take 250 mg by mouth daily. , Disp: , Rfl:  nitroGLYCERIN (NITROSTAT) 0.4 MG SL tablet, Place 1 tablet (0.4 mg total) under the tongue every 5 (five) minutes as needed for chest pain., Disp: 14 tablet, Rfl: 0 History  Substance Use Topics  . Smoking status: Never Smoker   . Smokeless tobacco: Never Used  . Alcohol Use: 0.0 oz/week    2-3 Glasses of wine per week    Family History  Problem Relation Age of Onset  . Cancer Mother     lung  . Cancer Father     lung; + tobacco  . Heart disease Maternal Grandfather     heart attack  . Heart disease Paternal Grandfather     heart attack  . Diabetes Sister   . Hypertension Sister   . Hyperlipidemia Sister      Review of Systems  Constitutional: Negative.   HENT: Negative.   Eyes: Negative.   Respiratory: Negative.   Cardiovascular: Negative.   Gastrointestinal: Negative.   Genitourinary: Negative.   Musculoskeletal: Positive for back pain and joint pain.  Skin: Negative.   Neurological: Negative.   Endo/Heme/Allergies: Negative.   Psychiatric/Behavioral: Negative.     Objective:  Physical Exam  Constitutional: She is oriented to person, place, and time. She appears well-developed and well-nourished.  HENT:  Head: Normocephalic and atraumatic.  Mouth/Throat: Oropharynx is clear and moist.  Eyes: Conjunctivae are normal. Pupils are equal, round, and reactive to light.  Neck: Normal range of motion. Neck supple.  Cardiovascular: Normal rate, regular rhythm and normal heart sounds.   Respiratory: Effort normal and breath sounds normal.  GI: Soft.  Genitourinary:  Not pertinent to current symptomatology therefore not examined.  Musculoskeletal:  Examination of her right knee reveals pain medially and laterally 1 to 2+ effusion range of motion 0-120 degrees knee is stable with normal patella  tracking. Exam of the left knee reveals full range of motion without pain swelling weakness or instability. Vascular exam: pulses 2+ and symmetric.  Neurological: She is alert and oriented to person, place, and time.  Skin: Skin is warm and dry.  Psychiatric: She has a normal mood and affect. Her behavior is normal.    Vital signs in last 24 hours:    Labs:   Estimated body mass index is 26.63 kg/(m^2) as calculated from the following:   Height as of 12/05/13: 5\' 5"  (1.651 m).   Weight as of 12/05/13: 72.576 kg (160 lb).   Imaging Review Plain radiographs demonstrate severe degenerative joint disease of the right knee(s). The overall alignment issignificant varus. The bone quality appears to be good for age and reported activity level.  Assessment/Plan:  End stage arthritis, right knee   The patient history, physical examination, clinical judgment of the provider and imaging studies are consistent with end stage degenerative joint disease of the right knee(s) and total  knee arthroplasty is deemed medically necessary. The treatment options including medical management, injection therapy arthroscopy and arthroplasty were discussed at length. The risks and benefits of total knee arthroplasty were presented and reviewed. The risks due to aseptic loosening, infection, stiffness, patella tracking problems, thromboembolic complications and other imponderables were discussed. The patient acknowledged the explanation, agreed to proceed with the plan and consent was signed. Patient is being admitted for inpatient treatment for surgery, pain control, PT, OT, prophylactic antibiotics, VTE prophylaxis, progressive ambulation and ADL's and discharge planning. The patient is planning to be discharged home with home health services  Jadien Lehigh A. Kaleen Mask Physician Assistant [facility] [phone]  01/16/2014, 2:47 PM

## 2014-01-16 NOTE — Pre-Procedure Instructions (Signed)
Monica Kane  01/16/2014   Your procedure is scheduled on:  Mon, Oct 12 @ 9:40 AM  Report to [facility]  at 7:40 AM.  Call this number if you have problems the morning of surgery: [phone]   Remember:   Do not eat food or drink liquids after midnight.   Take these medicines the morning of surgery with A SIP OF WATER: Carvedilol(Coreg) and Zoloft(Sertraline)               No Goody's,BC's,Aleve,Aspirin,Ibuprofen,Fish Oil,or any Herbal Medications   Do not wear jewelry, make-up or nail polish.  Do not wear lotions, powders, or perfumes. You may wear deodorant.  Do not shave 48 hours prior to surgery.  Do not bring valuables to the hospital.  [facility] is not responsible                  for any belongings or valuables.               Contacts, dentures or bridgework may not be worn into surgery.  Leave suitcase in the car. After surgery it may be brought to your room.  For patients admitted to the hospital, discharge time is determined by your                treatment team.                   Special Instructions:  [facility] - Preparing for Surgery  Before surgery, you can play an important role.  Because skin is not sterile, your skin needs to be as free of germs as possible.  You can reduce the number of germs on you skin by washing with CHG (chlorahexidine gluconate) soap before surgery.  CHG is an antiseptic cleaner which kills germs and bonds with the skin to continue killing germs even after washing.  Please DO NOT use if you have an allergy to CHG or antibacterial soaps.  If your skin becomes reddened/irritated stop using the CHG and inform your nurse when you arrive at Short Stay.  Do not shave (including legs and underarms) for at least 48 hours prior to the first CHG shower.  You may shave your face.  Please follow these instructions carefully:   1.  Shower with CHG Soap the night before surgery and the                                morning of  Surgery.  2.  If you choose to wash your hair, wash your hair first as usual with your       normal shampoo.  3.  After you shampoo, rinse your hair and body thoroughly to remove the                      Shampoo.  4.  Use CHG as you would any other liquid soap.  You can apply chg directly       to the skin and wash gently with scrungie or a clean washcloth.  5.  Apply the CHG Soap to your body ONLY FROM THE NECK DOWN.        Do not use on open wounds or open sores.  Avoid contact with your eyes,       ears, mouth and genitals (private parts).  Wash genitals (private parts)       with your  normal soap.  6.  Wash thoroughly, paying special attention to the area where your surgery        will be performed.  7.  Thoroughly rinse your body with warm water from the neck down.  8.  DO NOT shower/wash with your normal soap after using and rinsing off       the CHG Soap.  9.  Pat yourself dry with a clean towel.            10.  Wear clean pajamas.            11.  Place clean sheets on your bed the night of your first shower and do not        sleep with pets.  Day of Surgery  Do not apply any lotions/deoderants the morning of surgery.  Please wear clean clothes to the hospital/surgery center.     Please read over the following fact sheets that you were given: Pain Booklet, Coughing and Deep Breathing, Blood Transfusion Information, MRSA Information and Surgical Site Infection Prevention

## 2014-01-17 ENCOUNTER — Encounter (HOSPITAL_COMMUNITY)
Admission: RE | Admit: 2014-01-17 | Discharge: 2014-01-17 | Disposition: A | Discharge status: Home / Self Care | Payer: 59 | Source: Ambulatory Visit | Attending: Orthopedic Surgery | Admitting: Orthopedic Surgery

## 2014-01-17 ENCOUNTER — Encounter (HOSPITAL_COMMUNITY): Payer: Self-pay

## 2014-01-17 DIAGNOSIS — Z01812 Encounter for preprocedural laboratory examination: Secondary | ICD-10-CM | POA: Diagnosis present

## 2014-01-17 DIAGNOSIS — M1711 Unilateral primary osteoarthritis, right knee: Secondary | ICD-10-CM | POA: Insufficient documentation

## 2014-01-17 HISTORY — DX: Personal history of colon polyps, unspecified: Z86.0100

## 2014-01-17 HISTORY — DX: Pain in unspecified joint: M25.50

## 2014-01-17 HISTORY — DX: Personal history of colonic polyps: Z86.010

## 2014-01-17 HISTORY — DX: Personal history of urinary calculi: Z87.442

## 2014-01-17 HISTORY — DX: Personal history of other infectious and parasitic diseases: Z86.19

## 2014-01-17 HISTORY — DX: Polyneuropathy, unspecified: G62.9

## 2014-01-17 HISTORY — DX: Effusion, unspecified joint: M25.40

## 2014-01-17 LAB — CBC WITH DIFFERENTIAL/PLATELET
Basophils Absolute: 0 10*3/uL (ref 0.0–0.1)
Basophils Relative: 1 % (ref 0–1)
Eosinophils Absolute: 0.1 10*3/uL (ref 0.0–0.7)
Eosinophils Relative: 2 % (ref 0–5)
HCT: 41.6 % (ref 36.0–46.0)
HEMOGLOBIN: 13.4 g/dL (ref 12.0–15.0)
Lymphocytes Relative: 34 % (ref 12–46)
Lymphs Abs: 1.6 10*3/uL (ref 0.7–4.0)
MCH: 26.1 pg (ref 26.0–34.0)
MCHC: 32.2 g/dL (ref 30.0–36.0)
MCV: 81.1 fL (ref 78.0–100.0)
MONOS PCT: 7 % (ref 3–12)
Monocytes Absolute: 0.3 10*3/uL (ref 0.1–1.0)
NEUTROS ABS: 2.6 10*3/uL (ref 1.7–7.7)
Neutrophils Relative %: 56 % (ref 43–77)
Platelets: 202 10*3/uL (ref 150–400)
RBC: 5.13 MIL/uL — ABNORMAL HIGH (ref 3.87–5.11)
RDW: 12.9 % (ref 11.5–15.5)
WBC: 4.6 10*3/uL (ref 4.0–10.5)

## 2014-01-17 LAB — APTT: aPTT: 20 seconds — ABNORMAL LOW (ref 24–37)

## 2014-01-17 LAB — COMPREHENSIVE METABOLIC PANEL
ALBUMIN: 4 g/dL (ref 3.5–5.2)
ALK PHOS: 76 U/L (ref 39–117)
ALT: 18 U/L (ref 0–35)
ANION GAP: 12 (ref 5–15)
AST: 20 U/L (ref 0–37)
BILIRUBIN TOTAL: 0.5 mg/dL (ref 0.3–1.2)
BUN: 14 mg/dL (ref 6–23)
CHLORIDE: 104 meq/L (ref 96–112)
CO2: 26 mEq/L (ref 19–32)
Calcium: 9.9 mg/dL (ref 8.4–10.5)
Creatinine, Ser: 0.82 mg/dL (ref 0.50–1.10)
GFR calc Af Amer: 85 mL/min — ABNORMAL LOW (ref >90)
GFR calc non Af Amer: 73 mL/min — ABNORMAL LOW (ref >90)
Glucose, Bld: 114 mg/dL — ABNORMAL HIGH (ref 70–99)
POTASSIUM: 4.2 meq/L (ref 3.7–5.3)
Sodium: 142 mEq/L (ref 137–147)
Total Protein: 6.9 g/dL (ref 6.0–8.3)

## 2014-01-17 LAB — SURGICAL PCR SCREEN
MRSA, PCR: NEGATIVE
STAPHYLOCOCCUS AUREUS: NEGATIVE

## 2014-01-17 LAB — TYPE AND SCREEN
ABO/RH(D): AB POS
ANTIBODY SCREEN: NEGATIVE

## 2014-01-17 LAB — URINALYSIS, ROUTINE W REFLEX MICROSCOPIC
Bilirubin Urine: NEGATIVE
Glucose, UA: NEGATIVE mg/dL
Hgb urine dipstick: NEGATIVE
Ketones, ur: NEGATIVE mg/dL
NITRITE: NEGATIVE
Protein, ur: NEGATIVE mg/dL
SPECIFIC GRAVITY, URINE: 1.022 (ref 1.005–1.030)
UROBILINOGEN UA: 0.2 mg/dL (ref 0.0–1.0)
pH: 5 (ref 5.0–8.0)

## 2014-01-17 LAB — PROTIME-INR
INR: 0.96 (ref 0.00–1.49)
Prothrombin Time: 12.8 seconds (ref 11.6–15.2)

## 2014-01-17 LAB — URINE MICROSCOPIC-ADD ON

## 2014-01-17 LAB — ABO/RH: ABO/RH(D): AB POS

## 2014-01-17 MED ORDER — POVIDONE-IODINE 7.5 % EX SOLN: Freq: Once | CUTANEOUS | Status: DC

## 2014-01-17 MED ORDER — CHLORHEXIDINE GLUCONATE 4 % EX LIQD: 60.0000 mL | Freq: Once | CUTANEOUS | Status: DC

## 2014-01-17 NOTE — Progress Notes (Signed)
Saw a cardiologist while in the hospital back in April but was told she was clear and did not need to be seen anymore  Echo and heart cath in epic from 2015  Stress test done > 56yrs ago  Medical MD is Dr.Steven Daub  EKG and CXR in epic from 07-22-13

## 2014-01-18 LAB — URINE CULTURE: Colony Count: 4000

## 2014-01-25 NOTE — Progress Notes (Signed)
Patient called with time change. To arrive 710. Message left on machine

## 2014-01-28 ENCOUNTER — Inpatient Hospital Stay (HOSPITAL_COMMUNITY): Admission: RE | Admit: 2014-01-28 | Payer: 59 | Source: Ambulatory Visit | Admitting: Orthopedic Surgery

## 2014-01-28 ENCOUNTER — Encounter (HOSPITAL_COMMUNITY): Admission: RE | Payer: Self-pay | Source: Ambulatory Visit

## 2014-01-28 HISTORY — DX: Unilateral primary osteoarthritis, right knee: M17.11

## 2014-01-28 SURGERY — ARTHROPLASTY, KNEE, TOTAL
Anesthesia: General | Laterality: Right

## 2014-02-05 ENCOUNTER — Encounter (HOSPITAL_COMMUNITY): Payer: Self-pay | Admitting: Pharmacy Technician

## 2014-02-07 NOTE — Pre-Procedure Instructions (Signed)
Monica Kane  02/07/2014   Your procedure is scheduled on:  Monday February 18, 2014 at 1225 PM  Report to [facility] at 1025 AM.  Call this number if you have problems the morning of surgery: [phone]   Remember:   Do not eat food or drink liquids after midnight Sunday 02/17/14.   Take these medicines the morning of surgery with A SIP OF WATER: Carvedilol (Coreg), and Sertraline (Zoloft)   Do not wear jewelry, make-up or nail polish.  Do not wear lotions, powders, or perfumes. You may wear deodorant.  Do not shave 48 hours prior to surgery.   Do not bring valuables to the hospital.  [facility] is not responsible   for any belongings or valuables.               Contacts, dentures or bridgework may not be worn into surgery.  Leave suitcase in the car. After surgery it may be brought to your room.  For patients admitted to the hospital, discharge time is determined by your  treatment team.               Patients discharged the day of surgery will not be allowed to drive home.    Special Instructions: [facility] - Preparing for Surgery  Before surgery, you can play an important role.  Because skin is not sterile, your skin needs to be as free of germs as possible.  You can reduce the number of germs on you skin by washing with CHG (chlorahexidine gluconate) soap before surgery.  CHG is an antiseptic cleaner which kills germs and bonds with the skin to continue killing germs even after washing.  Please DO NOT use if you have an allergy to CHG or antibacterial soaps.  If your skin becomes reddened/irritated stop using the CHG and inform your nurse when you arrive at Short Stay.  Do not shave (including legs and underarms) for at least 48 hours prior to the first CHG shower.  You may shave your face.  Please follow these instructions carefully:   1.  Shower with CHG Soap the night before surgery and the                                morning of Surgery.  2.   If you choose to wash your hair, wash your hair first as usual with your       normal shampoo.  3.  After you shampoo, rinse your hair and body thoroughly to remove the                      Shampoo.  4.  Use CHG as you would any other liquid soap.  You can apply chg directly       to the skin and wash gently with scrungie or a clean washcloth.  5.  Apply the CHG Soap to your body ONLY FROM THE NECK DOWN.        Do not use on open wounds or open sores.  Avoid contact with your eyes,       ears, mouth and genitals (private parts).  Wash genitals (private parts)       with your normal soap.  6.  Wash thoroughly, paying special attention to the area where your surgery        will be performed.  7.  Thoroughly rinse your  body with warm water from the neck down.  8.  DO NOT shower/wash with your normal soap after using and rinsing off       the CHG Soap.  9.  Pat yourself dry with a clean towel.            10.  Wear clean pajamas.            11.  Place clean sheets on your bed the night of your first shower and do not        sleep with pets.  Day of Surgery  Do not apply any lotions/deoderants the morning of surgery.  Please wear clean clothes to the hospital/surgery center.      Please read over the following fact sheets that you were given: Pain Booklet, Coughing and Deep Breathing, Blood Transfusion Information, MRSA Information and Surgical Site Infection Prevention

## 2014-02-08 ENCOUNTER — Encounter (HOSPITAL_COMMUNITY): Payer: Self-pay

## 2014-02-08 ENCOUNTER — Encounter (HOSPITAL_COMMUNITY)
Admission: RE | Admit: 2014-02-08 | Discharge: 2014-02-08 | Disposition: A | Discharge status: Home / Self Care | Payer: 59 | Source: Ambulatory Visit | Attending: Orthopedic Surgery | Admitting: Orthopedic Surgery

## 2014-02-08 DIAGNOSIS — R079 Chest pain, unspecified: Secondary | ICD-10-CM | POA: Insufficient documentation

## 2014-02-08 DIAGNOSIS — Z01812 Encounter for preprocedural laboratory examination: Secondary | ICD-10-CM | POA: Diagnosis not present

## 2014-02-08 HISTORY — DX: Pneumonia, unspecified organism: J18.9

## 2014-02-08 LAB — CBC
HCT: 39.7 % (ref 36.0–46.0)
Hemoglobin: 13.5 g/dL (ref 12.0–15.0)
MCH: 26.4 pg (ref 26.0–34.0)
MCHC: 34 g/dL (ref 30.0–36.0)
MCV: 77.5 fL — ABNORMAL LOW (ref 78.0–100.0)
Platelets: 195 10*3/uL (ref 150–400)
RBC: 5.12 MIL/uL — ABNORMAL HIGH (ref 3.87–5.11)
RDW: 13.1 % (ref 11.5–15.5)
WBC: 5 10*3/uL (ref 4.0–10.5)

## 2014-02-08 LAB — APTT: aPTT: 25 seconds (ref 24–37)

## 2014-02-08 LAB — BASIC METABOLIC PANEL
ANION GAP: 13 (ref 5–15)
BUN: 12 mg/dL (ref 6–23)
CALCIUM: 9.9 mg/dL (ref 8.4–10.5)
CO2: 25 mEq/L (ref 19–32)
Chloride: 104 mEq/L (ref 96–112)
Creatinine, Ser: 0.84 mg/dL (ref 0.50–1.10)
GFR calc non Af Amer: 71 mL/min — ABNORMAL LOW (ref >90)
GFR, EST AFRICAN AMERICAN: 82 mL/min — AB (ref >90)
Glucose, Bld: 112 mg/dL — ABNORMAL HIGH (ref 70–99)
Potassium: 4.4 mEq/L (ref 3.7–5.3)
Sodium: 142 mEq/L (ref 137–147)

## 2014-02-08 LAB — TYPE AND SCREEN
ABO/RH(D): AB POS
Antibody Screen: NEGATIVE

## 2014-02-08 LAB — SURGICAL PCR SCREEN
MRSA, PCR: NEGATIVE
STAPHYLOCOCCUS AUREUS: NEGATIVE

## 2014-02-08 LAB — PROTIME-INR
INR: 0.97 (ref 0.00–1.49)
PROTHROMBIN TIME: 13 s (ref 11.6–15.2)

## 2014-02-08 NOTE — Progress Notes (Addendum)
PCP is Dr Arlyss Queen Denies seeing a cardiologist. States she had chest pain and came to the ED, at that time an Echo and card cath was preformed. (noted in epic)No further episodes of chest pain. EKG and CXR noted in epic 07-25-2013.

## 2014-02-14 ENCOUNTER — Encounter: Payer: Self-pay | Admitting: Emergency Medicine

## 2014-02-15 NOTE — Progress Notes (Signed)
Pt notified of surgery time change and instructed to arrive to Short Stay at 12:25 PM. Pt voiced understanding.

## 2014-02-17 MED ORDER — LACTATED RINGERS IV SOLN: INTRAVENOUS | Status: DC

## 2014-02-17 MED ORDER — VANCOMYCIN HCL IN DEXTROSE 1-5 GM/200ML-% IV SOLN
1000.0000 mg | INTRAVENOUS | Status: AC
  Administered 2014-02-18: 1000 mg via INTRAVENOUS

## 2014-02-18 ENCOUNTER — Inpatient Hospital Stay (HOSPITAL_COMMUNITY): Payer: 59 | Admitting: Anesthesiology

## 2014-02-18 ENCOUNTER — Encounter (HOSPITAL_COMMUNITY)
Admission: RE | Disposition: A | Discharge status: Home Health | Payer: Self-pay | Source: Ambulatory Visit | Attending: Orthopedic Surgery

## 2014-02-18 ENCOUNTER — Inpatient Hospital Stay (HOSPITAL_COMMUNITY)
Admission: RE | Admit: 2014-02-18 | Discharge: 2014-02-19 | DRG: 470 | Disposition: A | Discharge status: Home Health | Payer: 59 | Source: Ambulatory Visit | Attending: Orthopedic Surgery | Admitting: Orthopedic Surgery

## 2014-02-18 ENCOUNTER — Encounter (HOSPITAL_COMMUNITY): Payer: Self-pay | Admitting: Certified Registered Nurse Anesthetist

## 2014-02-18 DIAGNOSIS — G629 Polyneuropathy, unspecified: Secondary | ICD-10-CM | POA: Diagnosis present

## 2014-02-18 DIAGNOSIS — F32A Depression, unspecified: Secondary | ICD-10-CM | POA: Diagnosis present

## 2014-02-18 DIAGNOSIS — F419 Anxiety disorder, unspecified: Secondary | ICD-10-CM | POA: Diagnosis present

## 2014-02-18 DIAGNOSIS — M1711 Unilateral primary osteoarthritis, right knee: Principal | ICD-10-CM | POA: Diagnosis present

## 2014-02-18 DIAGNOSIS — F329 Major depressive disorder, single episode, unspecified: Secondary | ICD-10-CM | POA: Diagnosis present

## 2014-02-18 DIAGNOSIS — M179 Osteoarthritis of knee, unspecified: Secondary | ICD-10-CM | POA: Diagnosis present

## 2014-02-18 DIAGNOSIS — E785 Hyperlipidemia, unspecified: Secondary | ICD-10-CM | POA: Diagnosis present

## 2014-02-18 DIAGNOSIS — M1710 Unilateral primary osteoarthritis, unspecified knee: Secondary | ICD-10-CM | POA: Diagnosis present

## 2014-02-18 DIAGNOSIS — I10 Essential (primary) hypertension: Secondary | ICD-10-CM | POA: Diagnosis present

## 2014-02-18 HISTORY — PX: TOTAL KNEE ARTHROPLASTY: SHX125

## 2014-02-18 SURGERY — ARTHROPLASTY, KNEE, TOTAL
Anesthesia: General | Site: Knee | Laterality: Right

## 2014-02-18 MED ORDER — PROMETHAZINE HCL 25 MG/ML IJ SOLN: 6.2500 mg | INTRAMUSCULAR | Status: DC | PRN

## 2014-02-18 MED ORDER — ACETAMINOPHEN 650 MG RE SUPP: 650.0000 mg | Freq: Four times a day (QID) | RECTAL | Status: DC | PRN

## 2014-02-18 MED ORDER — HYDROMORPHONE HCL 1 MG/ML IJ SOLN
0.2500 mg | INTRAMUSCULAR | Status: DC | PRN
  Administered 2014-02-18: 0.25 mg via INTRAVENOUS

## 2014-02-18 MED ORDER — TRAZODONE HCL 50 MG PO TABS
50.0000 mg | ORAL_TABLET | Freq: Every evening | ORAL | Status: DC | PRN
  Administered 2014-02-18: 50 mg via ORAL
  Filled 2014-02-18 (×2): qty 1

## 2014-02-18 MED ORDER — TOBRAMYCIN SULFATE 1.2 G IJ SOLR
INTRAMUSCULAR | Status: AC
  Filled 2014-02-18: qty 1.2

## 2014-02-18 MED ORDER — AMITRIPTYLINE HCL 25 MG PO TABS
25.0000 mg | ORAL_TABLET | Freq: Every day | ORAL | Status: DC
  Administered 2014-02-18: 25 mg via ORAL
  Filled 2014-02-18 (×2): qty 1

## 2014-02-18 MED ORDER — DEXAMETHASONE SODIUM PHOSPHATE 10 MG/ML IJ SOLN
INTRAMUSCULAR | Status: AC
  Filled 2014-02-18: qty 2

## 2014-02-18 MED ORDER — FENTANYL CITRATE 0.05 MG/ML IJ SOLN
INTRAMUSCULAR | Status: DC | PRN
  Administered 2014-02-18: 150 ug via INTRAVENOUS
  Administered 2014-02-18 (×2): 25 ug via INTRAVENOUS
  Administered 2014-02-18: 50 ug via INTRAVENOUS

## 2014-02-18 MED ORDER — VITAMIN D3 25 MCG (1000 UNIT) PO TABS: 1000.0000 [IU] | ORAL_TABLET | Freq: Two times a day (BID) | ORAL | Status: DC

## 2014-02-18 MED ORDER — LISINOPRIL 10 MG PO TABS: 10.0000 mg | ORAL_TABLET | Freq: Every day | ORAL | Status: DC

## 2014-02-18 MED ORDER — PHENOL 1.4 % MT LIQD: 1.0000 | OROMUCOSAL | Status: DC | PRN

## 2014-02-18 MED ORDER — POLYETHYLENE GLYCOL 3350 17 G PO PACK
17.0000 g | PACK | Freq: Two times a day (BID) | ORAL | Status: DC
  Administered 2014-02-18 – 2014-02-19 (×2): 17 g via ORAL
  Filled 2014-02-18 (×3): qty 1

## 2014-02-18 MED ORDER — VANCOMYCIN HCL IN DEXTROSE 1-5 GM/200ML-% IV SOLN
1000.0000 mg | Freq: Two times a day (BID) | INTRAVENOUS | Status: AC
  Administered 2014-02-18: 1000 mg via INTRAVENOUS
  Filled 2014-02-18: qty 200

## 2014-02-18 MED ORDER — POTASSIUM CHLORIDE IN NACL 20-0.9 MEQ/L-% IV SOLN
INTRAVENOUS | Status: DC
  Administered 2014-02-19: 03:00:00 via INTRAVENOUS
  Filled 2014-02-18 (×3): qty 1000

## 2014-02-18 MED ORDER — OXYCODONE HCL 5 MG PO TABS
5.0000 mg | ORAL_TABLET | Freq: Once | ORAL | Status: AC | PRN
  Administered 2014-02-18: 5 mg via ORAL

## 2014-02-18 MED ORDER — HYDROMORPHONE HCL 1 MG/ML IJ SOLN
INTRAMUSCULAR | Status: AC
  Administered 2014-02-18: 16:00:00
  Filled 2014-02-18: qty 1

## 2014-02-18 MED ORDER — OXYCODONE HCL 5 MG PO TABS
ORAL_TABLET | ORAL | Status: AC
  Administered 2014-02-18: 15:00:00
  Filled 2014-02-18: qty 1

## 2014-02-18 MED ORDER — DOCUSATE SODIUM 100 MG PO CAPS
100.0000 mg | ORAL_CAPSULE | Freq: Two times a day (BID) | ORAL | Status: DC
  Administered 2014-02-18 – 2014-02-19 (×2): 100 mg via ORAL
  Filled 2014-02-18 (×2): qty 1

## 2014-02-18 MED ORDER — MIDAZOLAM HCL 2 MG/2ML IJ SOLN
INTRAMUSCULAR | Status: AC
  Filled 2014-02-18: qty 2

## 2014-02-18 MED ORDER — HYDROMORPHONE HCL 1 MG/ML IJ SOLN
INTRAMUSCULAR | Status: AC
  Administered 2014-02-18: 15:00:00
  Filled 2014-02-18: qty 1

## 2014-02-18 MED ORDER — HYDROMORPHONE HCL 1 MG/ML IJ SOLN
1.0000 mg | INTRAMUSCULAR | Status: DC | PRN
  Administered 2014-02-18: 1 mg via INTRAVENOUS
  Filled 2014-02-18 (×2): qty 1

## 2014-02-18 MED ORDER — BUPIVACAINE-EPINEPHRINE 0.25% -1:200000 IJ SOLN
INTRAMUSCULAR | Status: DC | PRN
  Administered 2014-02-18: 30 mL

## 2014-02-18 MED ORDER — SALINE SPRAY 0.65 % NA SOLN
1.0000 | Freq: Every day | NASAL | Status: DC
  Administered 2014-02-19: 1 via NASAL
  Filled 2014-02-18: qty 44

## 2014-02-18 MED ORDER — ALUM & MAG HYDROXIDE-SIMETH 200-200-20 MG/5ML PO SUSP: 30.0000 mL | ORAL | Status: DC | PRN

## 2014-02-18 MED ORDER — CELECOXIB 200 MG PO CAPS
200.0000 mg | ORAL_CAPSULE | Freq: Two times a day (BID) | ORAL | Status: DC
  Administered 2014-02-18 – 2014-02-19 (×2): 200 mg via ORAL
  Filled 2014-02-18 (×3): qty 1

## 2014-02-18 MED ORDER — FENTANYL CITRATE 0.05 MG/ML IJ SOLN
50.0000 ug | INTRAMUSCULAR | Status: DC | PRN
  Administered 2014-02-18: 50 ug via INTRAVENOUS
  Filled 2014-02-18: qty 2

## 2014-02-18 MED ORDER — ONDANSETRON HCL 4 MG/2ML IJ SOLN: 4.0000 mg | Freq: Four times a day (QID) | INTRAMUSCULAR | Status: DC | PRN

## 2014-02-18 MED ORDER — ACETAMINOPHEN 325 MG PO TABS: 650.0000 mg | ORAL_TABLET | Freq: Four times a day (QID) | ORAL | Status: DC | PRN

## 2014-02-18 MED ORDER — SERTRALINE HCL 50 MG PO TABS
250.0000 mg | ORAL_TABLET | Freq: Every day | ORAL | Status: DC
  Administered 2014-02-19: 250 mg via ORAL
  Filled 2014-02-18: qty 1

## 2014-02-18 MED ORDER — ONDANSETRON HCL 4 MG/2ML IJ SOLN
INTRAMUSCULAR | Status: DC | PRN
  Administered 2014-02-18: 4 mg via INTRAVENOUS

## 2014-02-18 MED ORDER — CARVEDILOL 3.125 MG PO TABS
3.1250 mg | ORAL_TABLET | Freq: Two times a day (BID) | ORAL | Status: DC
  Administered 2014-02-18 – 2014-02-19 (×2): 3.125 mg via ORAL
  Filled 2014-02-18 (×4): qty 1

## 2014-02-18 MED ORDER — BUPIVACAINE-EPINEPHRINE (PF) 0.25% -1:200000 IJ SOLN
INTRAMUSCULAR | Status: AC
  Filled 2014-02-18: qty 30

## 2014-02-18 MED ORDER — HYDROMORPHONE HCL 1 MG/ML IJ SOLN
0.2500 mg | INTRAMUSCULAR | Status: DC | PRN
  Administered 2014-02-18 (×4): 0.5 mg via INTRAVENOUS

## 2014-02-18 MED ORDER — ONDANSETRON HCL 4 MG PO TABS: 4.0000 mg | ORAL_TABLET | Freq: Four times a day (QID) | ORAL | Status: DC | PRN

## 2014-02-18 MED ORDER — LIDOCAINE HCL (CARDIAC) 20 MG/ML IV SOLN
INTRAVENOUS | Status: DC | PRN
  Administered 2014-02-18: 100 mg via INTRAVENOUS

## 2014-02-18 MED ORDER — DEXAMETHASONE SODIUM PHOSPHATE 10 MG/ML IJ SOLN
INTRAMUSCULAR | Status: DC | PRN
  Administered 2014-02-18: 10 mg via INTRAVENOUS

## 2014-02-18 MED ORDER — ROSUVASTATIN CALCIUM 10 MG PO TABS
10.0000 mg | ORAL_TABLET | Freq: Every day | ORAL | Status: DC
  Administered 2014-02-18: 10 mg via ORAL
  Filled 2014-02-18 (×2): qty 1

## 2014-02-18 MED ORDER — OXYCODONE HCL ER 20 MG PO T12A
20.0000 mg | EXTENDED_RELEASE_TABLET | Freq: Two times a day (BID) | ORAL | Status: DC
  Administered 2014-02-18 – 2014-02-19 (×2): 20 mg via ORAL
  Filled 2014-02-18 (×2): qty 2

## 2014-02-18 MED ORDER — MUPIROCIN 2 % EX OINT
1.0000 "application " | TOPICAL_OINTMENT | Freq: Two times a day (BID) | CUTANEOUS | Status: DC
  Administered 2014-02-18 – 2014-02-19 (×2): 1 via NASAL
  Filled 2014-02-18: qty 22

## 2014-02-18 MED ORDER — DIPHENHYDRAMINE HCL 12.5 MG/5ML PO ELIX: 12.5000 mg | ORAL_SOLUTION | ORAL | Status: DC | PRN

## 2014-02-18 MED ORDER — DEXAMETHASONE SODIUM PHOSPHATE 10 MG/ML IJ SOLN
10.0000 mg | Freq: Three times a day (TID) | INTRAMUSCULAR | Status: DC
  Administered 2014-02-18 – 2014-02-19 (×2): 10 mg via INTRAVENOUS
  Filled 2014-02-18 (×3): qty 1

## 2014-02-18 MED ORDER — ONDANSETRON HCL 4 MG/2ML IJ SOLN
INTRAMUSCULAR | Status: AC
  Filled 2014-02-18: qty 2

## 2014-02-18 MED ORDER — PROPOFOL 10 MG/ML IV BOLUS
INTRAVENOUS | Status: DC | PRN
  Administered 2014-02-18: 140 mg via INTRAVENOUS

## 2014-02-18 MED ORDER — OXYCODONE HCL 5 MG/5ML PO SOLN: 5.0000 mg | Freq: Once | ORAL | Status: AC | PRN

## 2014-02-18 MED ORDER — TOBRAMYCIN SULFATE 1.2 G IJ SOLR
INTRAMUSCULAR | Status: DC | PRN
  Administered 2014-02-18: 2.4 g

## 2014-02-18 MED ORDER — EPHEDRINE SULFATE 50 MG/ML IJ SOLN
INTRAMUSCULAR | Status: DC | PRN
  Administered 2014-02-18: 10 mg via INTRAVENOUS

## 2014-02-18 MED ORDER — FENTANYL CITRATE 0.05 MG/ML IJ SOLN
INTRAMUSCULAR | Status: AC
  Filled 2014-02-18: qty 5

## 2014-02-18 MED ORDER — MIDAZOLAM HCL 5 MG/5ML IJ SOLN
INTRAMUSCULAR | Status: DC | PRN
  Administered 2014-02-18: 2 mg via INTRAVENOUS

## 2014-02-18 MED ORDER — TRAZODONE HCL 50 MG PO TABS: 50.0000 mg | ORAL_TABLET | Freq: Every day | ORAL | Status: DC

## 2014-02-18 MED ORDER — RIVAROXABAN 10 MG PO TABS
10.0000 mg | ORAL_TABLET | Freq: Every day | ORAL | Status: DC
  Administered 2014-02-19: 10 mg via ORAL
  Filled 2014-02-18 (×2): qty 1

## 2014-02-18 MED ORDER — NITROGLYCERIN 0.4 MG SL SUBL: 0.4000 mg | SUBLINGUAL_TABLET | SUBLINGUAL | Status: DC | PRN

## 2014-02-18 MED ORDER — METOCLOPRAMIDE HCL 10 MG PO TABS: 5.0000 mg | ORAL_TABLET | Freq: Three times a day (TID) | ORAL | Status: DC | PRN

## 2014-02-18 MED ORDER — METOCLOPRAMIDE HCL 5 MG/ML IJ SOLN: 5.0000 mg | Freq: Three times a day (TID) | INTRAMUSCULAR | Status: DC | PRN

## 2014-02-18 MED ORDER — MIDAZOLAM HCL 2 MG/2ML IJ SOLN
1.0000 mg | INTRAMUSCULAR | Status: DC | PRN
  Administered 2014-02-18: 1 mg via INTRAVENOUS
  Filled 2014-02-18: qty 2

## 2014-02-18 MED ORDER — OXYCODONE HCL 5 MG PO TABS
5.0000 mg | ORAL_TABLET | ORAL | Status: DC | PRN
  Administered 2014-02-18 (×3): 5 mg via ORAL
  Administered 2014-02-19 (×2): 10 mg via ORAL
  Administered 2014-02-19: 5 mg via ORAL
  Administered 2014-02-19 (×2): 10 mg via ORAL
  Filled 2014-02-18 (×3): qty 2
  Filled 2014-02-18 (×2): qty 1
  Filled 2014-02-18 (×2): qty 2
  Filled 2014-02-18: qty 1

## 2014-02-18 MED ORDER — SODIUM CHLORIDE 0.9 % IR SOLN
Status: DC | PRN
  Administered 2014-02-18: 3000 mL

## 2014-02-18 MED ORDER — LACTATED RINGERS IV SOLN
INTRAVENOUS | Status: DC | PRN
  Administered 2014-02-18 (×2): via INTRAVENOUS

## 2014-02-18 MED ORDER — SODIUM CHLORIDE 0.9 % IR SOLN
Status: DC | PRN
  Administered 2014-02-18: 1000 mL

## 2014-02-18 MED ORDER — MENTHOL 3 MG MT LOZG: 1.0000 | LOZENGE | OROMUCOSAL | Status: DC | PRN

## 2014-02-18 SURGICAL SUPPLY — 71 items
APL SKNCLS STERI-STRIP NONHPOA (GAUZE/BANDAGES/DRESSINGS) ×1
BANDAGE ESMARK 6X9 LF (GAUZE/BANDAGES/DRESSINGS) ×1 IMPLANT
BENZOIN TINCTURE PRP APPL 2/3 (GAUZE/BANDAGES/DRESSINGS) ×2 IMPLANT
BLADE SAGITTAL 25.0X1.19X90 (BLADE) ×2 IMPLANT
BLADE SAW SGTL 11.0X1.19X90.0M (BLADE) IMPLANT
BLADE SAW SGTL 13.0X1.19X90.0M (BLADE) ×2 IMPLANT
BLADE SURG 10 STRL SS (BLADE) ×4 IMPLANT
BNDG CMPR 9X6 STRL LF SNTH (GAUZE/BANDAGES/DRESSINGS) ×1
BNDG CMPR MED 15X6 ELC VLCR LF (GAUZE/BANDAGES/DRESSINGS) ×1
BNDG ELASTIC 6X15 VLCR STRL LF (GAUZE/BANDAGES/DRESSINGS) ×2 IMPLANT
BNDG ESMARK 6X9 LF (GAUZE/BANDAGES/DRESSINGS) ×2
BOWL SMART MIX CTS (DISPOSABLE) ×2 IMPLANT
CAPT RP KNEE ×1 IMPLANT
CEMENT HV SMART SET (Cement) ×4 IMPLANT
COVER SURGICAL LIGHT HANDLE (MISCELLANEOUS) ×2 IMPLANT
CUFF TOURNIQUET SINGLE 34IN LL (TOURNIQUET CUFF) ×2 IMPLANT
CUFF TOURNIQUET SINGLE 44IN (TOURNIQUET CUFF) IMPLANT
DRAPE EXTREMITY T 121X128X90 (DRAPE) ×2 IMPLANT
DRAPE IMP U-DRAPE 54X76 (DRAPES) ×2 IMPLANT
DRAPE INCISE IOBAN 66X45 STRL (DRAPES) ×2 IMPLANT
DRAPE PROXIMA HALF (DRAPES) ×2 IMPLANT
DRAPE U-SHAPE 47X51 STRL (DRAPES) ×2 IMPLANT
DRSG AQUACEL AG ADV 3.5X14 (GAUZE/BANDAGES/DRESSINGS) ×2 IMPLANT
DRSG PAD ABDOMINAL 8X10 ST (GAUZE/BANDAGES/DRESSINGS) ×4 IMPLANT
DURAPREP 26ML APPLICATOR (WOUND CARE) ×3 IMPLANT
ELECT CAUTERY BLADE 6.4 (BLADE) ×2 IMPLANT
ELECT REM PT RETURN 9FT ADLT (ELECTROSURGICAL) ×2
ELECTRODE REM PT RTRN 9FT ADLT (ELECTROSURGICAL) ×1 IMPLANT
EVACUATOR 1/8 PVC DRAIN (DRAIN) ×2 IMPLANT
FACESHIELD WRAPAROUND (MASK) ×4 IMPLANT
FACESHIELD WRAPAROUND OR TEAM (MASK) ×1 IMPLANT
GAUZE SPONGE 4X4 12PLY STRL (GAUZE/BANDAGES/DRESSINGS) ×2 IMPLANT
GLOVE BIO SURGEON STRL SZ7 (GLOVE) ×2 IMPLANT
GLOVE BIOGEL PI IND STRL 7.0 (GLOVE) ×1 IMPLANT
GLOVE BIOGEL PI IND STRL 7.5 (GLOVE) ×1 IMPLANT
GLOVE BIOGEL PI INDICATOR 7.0 (GLOVE) ×1
GLOVE BIOGEL PI INDICATOR 7.5 (GLOVE) ×1
GLOVE SS BIOGEL STRL SZ 7.5 (GLOVE) ×1 IMPLANT
GLOVE SUPERSENSE BIOGEL SZ 7.5 (GLOVE) ×1
GOWN STRL REUS W/ TWL LRG LVL3 (GOWN DISPOSABLE) ×2 IMPLANT
GOWN STRL REUS W/ TWL XL LVL3 (GOWN DISPOSABLE) ×2 IMPLANT
GOWN STRL REUS W/TWL LRG LVL3 (GOWN DISPOSABLE) ×4
GOWN STRL REUS W/TWL XL LVL3 (GOWN DISPOSABLE) ×4
HANDPIECE INTERPULSE COAX TIP (DISPOSABLE) ×2
HOOD PEEL AWAY FACE SHEILD DIS (HOOD) ×4 IMPLANT
IMMOBILIZER KNEE 22 UNIV (SOFTGOODS) ×1 IMPLANT
KIT BASIN OR (CUSTOM PROCEDURE TRAY) ×2 IMPLANT
KIT ROOM TURNOVER OR (KITS) ×2 IMPLANT
MANIFOLD NEPTUNE II (INSTRUMENTS) ×2 IMPLANT
MARKER SKIN DUAL TIP RULER LAB (MISCELLANEOUS) ×2 IMPLANT
NS IRRIG 1000ML POUR BTL (IV SOLUTION) ×2 IMPLANT
PACK TOTAL JOINT (CUSTOM PROCEDURE TRAY) ×2 IMPLANT
PACK UNIVERSAL I (CUSTOM PROCEDURE TRAY) ×2 IMPLANT
PAD ABD 8X10 STRL (GAUZE/BANDAGES/DRESSINGS) ×1 IMPLANT
PAD ARMBOARD 7.5X6 YLW CONV (MISCELLANEOUS) ×4 IMPLANT
PADDING CAST COTTON 6X4 STRL (CAST SUPPLIES) ×2 IMPLANT
RUBBERBAND STERILE (MISCELLANEOUS) ×2 IMPLANT
SET HNDPC FAN SPRY TIP SCT (DISPOSABLE) ×1 IMPLANT
STRIP CLOSURE SKIN 1/2X4 (GAUZE/BANDAGES/DRESSINGS) ×2 IMPLANT
SUCTION FRAZIER TIP 10 FR DISP (SUCTIONS) ×2 IMPLANT
SUT ETHIBOND NAB CT1 #1 30IN (SUTURE) ×4 IMPLANT
SUT MNCRL AB 3-0 PS2 18 (SUTURE) ×2 IMPLANT
SUT VIC AB 0 CT1 27 (SUTURE) ×4
SUT VIC AB 0 CT1 27XBRD ANBCTR (SUTURE) ×2 IMPLANT
SUT VIC AB 2-0 CT1 27 (SUTURE) ×4
SUT VIC AB 2-0 CT1 TAPERPNT 27 (SUTURE) ×2 IMPLANT
SYR 30ML SLIP (SYRINGE) ×2 IMPLANT
TOWEL OR 17X24 6PK STRL BLUE (TOWEL DISPOSABLE) ×2 IMPLANT
TOWEL OR 17X26 10 PK STRL BLUE (TOWEL DISPOSABLE) ×2 IMPLANT
TRAY FOLEY CATH 16FR SILVER (SET/KITS/TRAYS/PACK) ×2 IMPLANT
WATER STERILE IRR 1000ML POUR (IV SOLUTION) ×4 IMPLANT

## 2014-02-18 NOTE — Op Note (Signed)
MRN:     [id number] DOB/AGE:    [birth year] / 66 y.o.       OPERATIVE REPORT    DATE OF PROCEDURE:  02/18/2014       PREOPERATIVE DIAGNOSIS:   right knee primary localized OA      Estimated body mass index is 25.86 kg/(m^2) as calculated from the following:   Height as of this encounter: 5' 5.75" (1.67 m).   Weight as of this encounter: 72.122 kg (159 lb).                                                        POSTOPERATIVE DIAGNOSIS:   Same as above                                                                    PROCEDURE:  Procedure(s): TOTAL KNEE ARTHROPLASTY Using Depuy Sigma RP implants #2.5 Femur, #3Tibia, 12.31mm sigma RP bearing, 32 Patella     SURGEON: Smitty Ackerley A    ASSISTANT:  Kirstin Shepperson PA-C   (Present and scrubbed throughout the case, critical for assistance with exposure, retraction, instrumentation, and closure.)         ANESTHESIA: GET with Femoral Nerve Block  DRAINS: foley, 2 medium hemovac in knee   TOURNIQUET TIME: 78GNF   COMPLICATIONS:  None     SPECIMENS: None   INDICATIONS FOR PROCEDURE: The patient has  right knee DJD, varus deformities, XR shows bone on bone arthritis. Patient has failed all conservative measures including anti-inflammatory medicines, narcotics, attempts at  exercise and weight loss, cortisone injections and viscosupplementation.  Risks and benefits of surgery have been discussed, questions answered.   DESCRIPTION OF PROCEDURE: The patient identified by armband, received  right femoral nerve block and IV antibiotics, in the holding area at [facility]. Patient taken to the operating room, appropriate anesthetic  monitors were attached General endotracheal anesthesia induced with  the patient in supine position, Foley catheter was inserted. Tourniquet  applied high to the operative thigh. Lateral post and foot positioner  applied to the table, the lower extremity was then prepped and draped  in usual sterile fashion  from the ankle to the tourniquet. Time-out procedure was performed. The limb was wrapped with an Esmarch bandage and the tourniquet inflated to 365 mmHg. We began the operation by making the anterior midline incision starting at handbreadth above the patella going over the patella 1 cm medial to and  4 cm distal to the tibial tubercle. Small bleeders in the skin and the  subcutaneous tissue identified and cauterized. Transverse retinaculum was incised and reflected medially and a medial parapatellar arthrotomy was accomplished. the patella was everted and theprepatellar fat pad resected. The superficial medial collateral  ligament was then elevated from anterior to posterior along the proximal  flare of the tibia and anterior half of the menisci resected. The knee was hyperflexed exposing bone on bone arthritis. Peripheral and notch osteophytes as well as the cruciate ligaments were then resected. We continued to  work our way around posteriorly along the proximal tibia, and externally  rotated the tibia subluxing it out from underneath the femur. A McHale  retractor was placed through the notch and a lateral Hohmann retractor  placed, and we then drilled through the proximal tibia in line with the  axis of the tibia followed by an intramedullary guide rod and 2-degree  posterior slope cutting guide. The tibial cutting guide was pinned into place  allowing resection of 6 mm of bone medially and about 4 mm of bone  laterally because of her valgus deformity. Satisfied with the tibial resection, we then  entered the distal femur 2 mm anterior to the PCL origin with the  intramedullary guide rod and applied the distal femoral cutting guide  set at 19mm, with 5 degrees of valgus. This was pinned along the  epicondylar axis. At this point, the distal femoral cut was accomplished without difficulty. We then sized for a #2.5 femoral component and pinned the guide in 3 degrees of external rotation.The chamfer  cutting guide was pinned into place. The anterior, posterior, and chamfer cuts were accomplished without difficulty followed by  the Sigma RP box cutting guide and the box cut. We also removed posterior osteophytes from the posterior femoral condyles. At this  time, the knee was brought into full extension. We checked our  extension and flexion gaps and found them symmetric at 12.65mm.  The patella thickness measured at 23 mm. We set the cutting guide at 14 and removed the posterior 9.5-10 mm  of the patella sized for 32 button and drilled the lollipop. The knee  was then once again hyperflexed exposing the proximal tibia. We sized for a #3 tibial base plate, applied the smokestack and the conical reamer followed by the the Delta fin keel punch. We then hammered into place the Sigma RP trial femoral component, inserted a 12.5-mm trial bearing, trial patellar button, and took the knee through range of motion from 0-130 degrees. No thumb pressure was required for patellar  tracking. At this point, all trial components were removed, a double batch of DePuy HV cement with 1500 mg of Tobra was mixed and applied to all bony metallic mating surfaces except for the posterior condyles of the femur itself. In order, we  hammered into place the tibial tray and removed excess cement, the femoral component and removed excess cement, a 12.5-mm Sigma RP bearing  was inserted, and the knee brought to full extension with compression.  The patellar button was clamped into place, and excess cement  removed. While the cement cured the wound was irrigated out with normal saline solution pulse lavage, and medium Hemovac drains were placed.. Ligament stability and patellar tracking were checked and found to be excellent. The tourniquet was then released and hemostasis was obtained with cautery. The parapatellar arthrotomy was closed with  #1 ethibond suture. The subcutaneous tissue with 0 and 2-0 undyed  Vicryl suture, and 4-0  Monocryl.. A dressing of Xeroform,  4 x 4, dressing sponges, Webril, and Ace wrap applied. Needle and sponge count were correct times 2.The patient awakened, extubated, and taken to recovery room without difficulty. Vascular status was normal, pulses 2+ and symmetric.   Jazsmin Couse A 02/18/2014, 1:51 PM

## 2014-02-18 NOTE — Anesthesia Procedure Notes (Addendum)
Anesthesia Regional Block:  Adductor canal block  Pre-Anesthetic Checklist: ,, timeout performed, Correct Patient, Correct Site, Correct Laterality, Correct Procedure, Correct Position, site marked, Risks and benefits discussed,  Surgical consent,  Pre-op evaluation,  At surgeon's request and post-op pain management  Laterality: Right and Upper  Prep: chloraprep       Needles:   Needle Type: Echogenic Needle     Needle Length: 9cm 9 cm Needle Gauge: 21 and 21 G  Needle insertion depth: 5 cm   Additional Needles:  Procedures: ultrasound guided (picture in chart) Adductor canal block Narrative:  Start time: 02/18/2014 11:15 AM End time: 02/18/2014 11:30 AM Injection made incrementally with aspirations every 5 mL.  Performed by: Personally   Additional Notes: Tolerated well   Procedure Name: LMA Insertion Date/Time: 02/18/2014 12:25 PM Performed by: Blair Heys E Pre-anesthesia Checklist: Patient identified, Emergency Drugs available, Suction available and Patient being monitored Patient Re-evaluated:Patient Re-evaluated prior to inductionOxygen Delivery Method: Circle system utilized Preoxygenation: Pre-oxygenation with 100% oxygen Intubation Type: IV induction Ventilation: Mask ventilation without difficulty LMA: LMA inserted LMA Size: 4.0 Number of attempts: 1 Placement Confirmation: positive ETCO2 and breath sounds checked- equal and bilateral Tube secured with: Tape Dental Injury: Teeth and Oropharynx as per pre-operative assessment

## 2014-02-18 NOTE — Progress Notes (Signed)
Utilization review completed.  

## 2014-02-18 NOTE — Anesthesia Postprocedure Evaluation (Signed)
  Anesthesia Post-op Note  Patient: Monica Kane  Procedure(s) Performed: Procedure(s): TOTAL KNEE ARTHROPLASTY (Right)  Patient Location: PACU  Anesthesia Type:GA combined with regional for post-op pain  Level of Consciousness: awake and alert   Airway and Oxygen Therapy: Patient Spontanous Breathing  Post-op Pain: mild  Post-op Assessment: Post-op Vital signs reviewed  Post-op Vital Signs: stable  Last Vitals:  Filed Vitals:   02/18/14 1415  BP: 197/67  Pulse: 78  Temp: 36.4 C  Resp:     Complications: No apparent anesthesia complications

## 2014-02-18 NOTE — Progress Notes (Signed)
Dr massage at bedside aware of pts bp 206/91 no orders obtained at this time will medicate pt with pain med and continue to monitor bp

## 2014-02-18 NOTE — H&P (Signed)
TOTAL KNEE ADMISSION H&P  Patient is being admitted for right total knee arthroplasty.  Subjective:  Chief Complaint:right knee pain.  HPI: Monica Kane, 68 y.o. female, has a history of pain and functional disability in the right knee due to arthritis and has failed non-surgical conservative treatments for greater than 12 weeks to includeNSAID's and/or analgesics, corticosteriod injections, viscosupplementation injections, flexibility and strengthening excercises, supervised PT with diminished ADL's post treatment, use of assistive devices, weight reduction as appropriate and activity modification.  Onset of symptoms was gradual, starting 10 years ago with gradually worsening course since that time. The patient noted prior procedures on the knee to include  arthroscopy and menisectomy on the right knee(s).  Patient currently rates pain in the right knee(s) at 10 out of 10 with activity. Patient has night pain, worsening of pain with activity and weight bearing, pain that interferes with activities of daily living, crepitus and joint swelling.  Patient has evidence of subchondral sclerosis, joint subluxation and joint space narrowing by imaging studies.. There is no active infection.  Patient Active Problem List   Diagnosis Date Noted  . Lateral epicondylitis of left elbow 02/24/2011    Priority: High  . Migraine 02/24/2011    Priority: Low  . Cataract extraction status of right eye 02/24/2011    Priority: Low  . Arthritis 02/24/2011    Priority: Low  . Cataract extraction status of left eye 01/16/2014  . Primary localized osteoarthritis of right knee   . Anxiety   . Chest pain, midsternal 07/23/2013  . Osteopenia 03/23/2013  . Kidney stones, calcium oxalate 03/23/2013  . Hypertension 03/31/2011  . Hyperlipidemia 03/31/2011  . Vitamin d deficiency 03/31/2011  . Hyperglycemia 03/31/2011  . Foot pain, left 03/24/2011  . Hallux rigidus 03/24/2011  . Metatarsalgia of left foot  03/24/2011  . Depression 02/24/2011   Past Medical History  Diagnosis Date  . Hyperlipidemia     takes Crestor nightly   . Primary localized osteoarthritis of right knee   . Hypertension     takes Lisinopril and Coreg daily  . History of staph infection 11/2013  . Neuropathy     takes Elavil nightly  . Joint pain   . Joint swelling   . History of colon polyps   . History of kidney stones   . Depression     takes Zoloft daily  . Anxiety     takes Ativan nightly  . Pneumonia     Past Surgical History  Procedure Laterality Date  . Abdominal hysterectomy  1989  . Lateral release right elbow  2010  . Shoulder arthroscopy w/ rotator cuff repair  2006  . Cervical spine surgery  2004    C 5-6  . Parotid gland tumor excision  1976    benign  . Diagnostic laparoscopy  1976    fibroid removed  . Appendectomy    . Colonoscopy    . Shoulder arthroscopy distal clavicle excision and open rotator cuff repair  2006  . Parotid gland tumor excision  1976    rt  . Lateral epicondyle release  03/01/2011    Procedure: TENNIS ELBOW RELEASE;  Surgeon: Lorn Junes, MD;  Location: [facility];  Service: Orthopedics;  Laterality: Left;  TENOTOMY ELBOW LATERAL EPICONDYLITIS TENNIS ELBOW  . Knee arthroscopy w/ meniscectomy Right 01/31/2013    [facility]  . Left foot surgery  2013    mortons neuroma  . Cardiac catheterization  2015  . Eye surgery Bilateral  cataract  . Colonscopy    . Elbow arthroscopy  2010, 2012  . Knee arthroscopy  Oct 2014  . Foot surgery Left 2013    Prescriptions prior to admission  Medication Sig Dispense Refill Last Dose  . amitriptyline (ELAVIL) 25 MG tablet Take 25 mg by mouth at bedtime.   02/17/2014  . carvedilol (COREG) 3.125 MG tablet Take 1 tablet (3.125 mg total) by mouth 2 (two) times daily with a meal. 180 tablet 3 02/18/2014 at 0900  . lisinopril (PRINIVIL,ZESTRIL) 10 MG tablet Take 10 mg by mouth daily.   02/17/2014 at Unknown time  .  mupirocin ointment (BACTROBAN) 2 % Place 1 application into the nose 2 (two) times daily. 22 g 0 02/17/2014 at Unknown time  . rosuvastatin (CRESTOR) 10 MG tablet Take 1 tablet (10 mg total) by mouth at bedtime. 90 tablet 3 02/17/2014 at Unknown time  . sertraline (ZOLOFT) 100 MG tablet Take 250 mg by mouth daily.    02/18/2014 at Unknown time  . sodium chloride (OCEAN) 0.65 % SOLN nasal spray Place 1 spray into both nostrils daily.   Past Week at Unknown time  . traZODone (DESYREL) 50 MG tablet Take 50-100 mg by mouth at bedtime.   02/17/2014 at Unknown time  . cholecalciferol (VITAMIN D) 1000 UNITS tablet Take 1,000 Units by mouth 2 (two) times daily.    More than a month at Unknown time  . nitroGLYCERIN (NITROSTAT) 0.4 MG SL tablet Place 1 tablet (0.4 mg total) under the tongue every 5 (five) minutes as needed for chest pain. 14 tablet 0 More than a month at Unknown time   Allergies  Allergen Reactions  . Penicillins Swelling and Rash   Current facility-administered medications: fentaNYL (SUBLIMAZE) injection 50-100 mcg, 50-100 mcg, Intravenous, PRN, Rica Koyanagi, MD;  lactated ringers infusion, , Intravenous, Continuous, Neizan Debruhl J Alexa Blish, PA-C;  midazolam (VERSED) injection 1-2 mg, 1-2 mg, Intravenous, PRN, Rica Koyanagi, MD;  vancomycin (VANCOCIN) IVPB 1000 mg/200 mL premix, 1,000 mg, Intravenous, On Call to OR, Asyria Kolander Earl Lagos, PA-C History  Substance Use Topics  . Smoking status: Never Smoker   . Smokeless tobacco: Never Used  . Alcohol Use: Yes     Comment: 1-2 drinks weekly    Family History  Problem Relation Age of Onset  . Cancer Mother     lung  . Cancer Father     lung; + tobacco  . Heart disease Maternal Grandfather     heart attack  . Heart disease Paternal Grandfather     heart attack  . Diabetes Sister   . Hypertension Sister   . Hyperlipidemia Sister      Review of Systems  Constitutional: Negative.   HENT: Negative.   Eyes: Negative.   Respiratory:  Negative.   Cardiovascular: Negative.   Gastrointestinal: Negative.   Genitourinary: Negative.   Musculoskeletal: Positive for back pain and joint pain.  Skin: Negative.   Neurological: Negative.   Endo/Heme/Allergies: Negative.   Psychiatric/Behavioral: Negative.     Objective:  Physical Exam  Constitutional: She is oriented to person, place, and time. She appears well-developed and well-nourished.  HENT:  Head: Normocephalic and atraumatic.  Mouth/Throat: Oropharynx is clear and moist.  Eyes: Conjunctivae are normal. Pupils are equal, round, and reactive to light.  Neck: Normal range of motion. Neck supple.  Cardiovascular: Normal rate, regular rhythm and normal heart sounds.   Respiratory: Effort normal and breath sounds normal.  GI: Soft.  Genitourinary:  Not pertinent to current  symptomatology therefore not examined.  Musculoskeletal:  Examination of her right knee reveals pain medially and laterally 1 to 2+ effusion range of motion 0-120 degrees knee is stable with normal patella tracking. Exam of the left knee reveals full range of motion without pain swelling weakness or instability. Vascular exam: pulses 2+ and symmetric.  Neurological: She is alert and oriented to person, place, and time.  Skin: Skin is warm and dry.  Psychiatric: She has a normal mood and affect. Her behavior is normal.    Vital signs in last 24 hours: Temp:  [98.8 F (37.1 C)] 98.8 F (37.1 C) (11/02 1005) Pulse Rate:  [65] 65 (11/02 1005) Resp:  [20] 20 (11/02 1005) BP: (162)/(74) 162/74 mmHg (11/02 1005) SpO2:  [98 %] 98 % (11/02 1005) Weight:  [72.122 kg (159 lb)] 72.122 kg (159 lb) (11/02 1005)  Labs:   Estimated body mass index is 25.86 kg/(m^2) as calculated from the following:   Height as of this encounter: 5' 5.75" (1.67 m).   Weight as of this encounter: 72.122 kg (159 lb).   Imaging Review Plain radiographs demonstrate severe degenerative joint disease of the right knee(s). The  overall alignment issignificant varus. The bone quality appears to be good for age and reported activity level.  Assessment/Plan:  End stage arthritis, right knee   The patient history, physical examination, clinical judgment of the provider and imaging studies are consistent with end stage degenerative joint disease of the right knee(s) and total knee arthroplasty is deemed medically necessary. The treatment options including medical management, injection therapy arthroscopy and arthroplasty were discussed at length. The risks and benefits of total knee arthroplasty were presented and reviewed. The risks due to aseptic loosening, infection, stiffness, patella tracking problems, thromboembolic complications and other imponderables were discussed. The patient acknowledged the explanation, agreed to proceed with the plan and consent was signed. Patient is being admitted for inpatient treatment for surgery, pain control, PT, OT, prophylactic antibiotics, VTE prophylaxis, progressive ambulation and ADL's and discharge planning. The patient is planning to be discharged home with home health services  Marigrace Mccole A. Kaleen Mask Physician Assistant [facility] [phone]  02/18/2014, 11:07 AM

## 2014-02-18 NOTE — Transfer of Care (Signed)
Immediate Anesthesia Transfer of Care Note  Patient: Monica Kane  Procedure(s) Performed: Procedure(s): TOTAL KNEE ARTHROPLASTY (Right)  Patient Location: PACU  Anesthesia Type:General and Regional  Level of Consciousness: awake and alert   Airway & Oxygen Therapy: Patient Spontanous Breathing and Patient connected to nasal cannula oxygen  Post-op Assessment: Report given to PACU RN, Post -op Vital signs reviewed and stable and Patient moving all extremities X 4  Post vital signs: Reviewed and stable  Complications: No apparent anesthesia complications

## 2014-02-18 NOTE — Interval H&P Note (Signed)
History and Physical Interval Note:  02/18/2014 12:04 PM  Monica Kane  has presented today for surgery, with the diagnosis of right knee primary localized OA  The various methods of treatment have been discussed with the patient and family. After consideration of risks, benefits and other options for treatment, the patient has consented to  Procedure(s): TOTAL KNEE ARTHROPLASTY (Right) as a surgical intervention .  The patient's history has been reviewed, patient examined, no change in status, stable for surgery.  I have reviewed the patient's chart and labs.  Questions were answered to the patient's satisfaction.     Elsie Saas A

## 2014-02-18 NOTE — Anesthesia Preprocedure Evaluation (Addendum)
Anesthesia Evaluation  Patient identified by MRN, date of birth, ID band Patient awake    Reviewed: Allergy & Precautions, H&P , NPO status , Patient's Chart, lab work & pertinent test results  History of Anesthesia Complications Negative for: history of anesthetic complications  Airway Mallampati: I  TM Distance: >3 FB Neck ROM: Full    Dental  (+) Dental Advisory Given, Teeth Intact   Pulmonary neg pulmonary ROS,  breath sounds clear to auscultation        Cardiovascular hypertension, Pt. on medications and Pt. on home beta blockers Rhythm:Regular Rate:Normal     Neuro/Psych  Headaches, PSYCHIATRIC DISORDERS Anxiety Depression    GI/Hepatic negative GI ROS, Neg liver ROS,   Endo/Other    Renal/GU      Musculoskeletal  (+) Arthritis -, Osteoarthritis,    Abdominal   Peds  Hematology negative hematology ROS (+)   Anesthesia Other Findings   Reproductive/Obstetrics                            Anesthesia Physical Anesthesia Plan  ASA: II  Anesthesia Plan: General   Post-op Pain Management:    Induction: Intravenous  Airway Management Planned: LMA  Additional Equipment:   Intra-op Plan:   Post-operative Plan: Extubation in OR  Informed Consent: I have reviewed the patients History and Physical, chart, labs and discussed the procedure including the risks, benefits and alternatives for the proposed anesthesia with the patient or authorized representative who has indicated his/her understanding and acceptance.   Dental advisory given  Plan Discussed with: CRNA, Anesthesiologist and Surgeon  Anesthesia Plan Comments:        Anesthesia Quick Evaluation

## 2014-02-18 NOTE — Progress Notes (Signed)
Orthopedic Tech Progress Note Patient Details:  Monica Kane September 19, 1945 [id number] CPM applied to RLE with appropriate settings. OHF applied to bed. Footsie roll provided.  CPM Right Knee CPM Right Knee: On Right Knee Flexion (Degrees): 90 Right Knee Extension (Degrees): 0   Asia R Thompson 02/18/2014, 3:13 PM

## 2014-02-19 LAB — BASIC METABOLIC PANEL
ANION GAP: 11 (ref 5–15)
BUN: 6 mg/dL (ref 6–23)
CALCIUM: 8.6 mg/dL (ref 8.4–10.5)
CO2: 26 meq/L (ref 19–32)
Chloride: 101 mEq/L (ref 96–112)
Creatinine, Ser: 0.63 mg/dL (ref 0.50–1.10)
GFR calc Af Amer: >90 mL/min (ref >90)
Glucose, Bld: 286 mg/dL — ABNORMAL HIGH (ref 70–99)
Potassium: 3.7 mEq/L (ref 3.7–5.3)
SODIUM: 138 meq/L (ref 137–147)

## 2014-02-19 LAB — CBC
HCT: 32.4 % — ABNORMAL LOW (ref 36.0–46.0)
Hemoglobin: 10.7 g/dL — ABNORMAL LOW (ref 12.0–15.0)
MCH: 25.7 pg — AB (ref 26.0–34.0)
MCHC: 33 g/dL (ref 30.0–36.0)
MCV: 77.9 fL — ABNORMAL LOW (ref 78.0–100.0)
Platelets: 195 10*3/uL (ref 150–400)
RBC: 4.16 MIL/uL (ref 3.87–5.11)
RDW: 13.4 % (ref 11.5–15.5)
WBC: 8.7 10*3/uL (ref 4.0–10.5)

## 2014-02-19 MED ORDER — OXYCODONE HCL ER 20 MG PO T12A: EXTENDED_RELEASE_TABLET | ORAL | Status: DC

## 2014-02-19 MED ORDER — DSS 100 MG PO CAPS: ORAL_CAPSULE | ORAL | Status: DC

## 2014-02-19 MED ORDER — POLYETHYLENE GLYCOL 3350 17 G PO PACK: PACK | ORAL | Status: DC

## 2014-02-19 MED ORDER — OXYCODONE HCL 5 MG PO TABS: ORAL_TABLET | ORAL | Status: DC

## 2014-02-19 MED ORDER — CELECOXIB 200 MG PO CAPS
200.0000 mg | ORAL_CAPSULE | Freq: Two times a day (BID) | ORAL | Status: DC
Start: 2014-02-19 — End: 2015-06-11

## 2014-02-19 MED ORDER — ACETAMINOPHEN 325 MG PO TABS: 650.0000 mg | ORAL_TABLET | Freq: Four times a day (QID) | ORAL | Status: DC | PRN

## 2014-02-19 MED ORDER — RIVAROXABAN 10 MG PO TABS: ORAL_TABLET | ORAL | Status: DC

## 2014-02-19 NOTE — Plan of Care (Signed)
Problem: Phase I Progression Outcomes Goal: Pain controlled with appropriate interventions Outcome: Progressing Goal: Dangle or out of bed evening of surgery Outcome: Completed/Met Date Met:  02/19/14

## 2014-02-19 NOTE — Evaluation (Signed)
Occupational Therapy Evaluation and Discharge Patient Details Name: Monica Kane MRN: [id number] DOB: [birth year] Today's Date: 02/19/2014    History of Present Illness RTKA   Clinical Impression   This 68 yo female admitted and underwent above presents to acute OT with all education completed, we will D/C from acute OT.    Follow Up Recommendations  No OT follow up    Equipment Recommendations  None recommended by OT       Precautions / Restrictions Precautions Precautions: None Restrictions Weight Bearing Restrictions: No RLE Weight Bearing: Weight bearing as tolerated      Mobility Bed Mobility               General bed mobility comments: Pt up in recliner upon my arrival  Transfers Overall transfer level: Needs assistance Equipment used: Rolling walker (2 wheeled) Transfers: Sit to/from Stand Sit to Stand: Supervision         General transfer comment: Pt ambulated from her room (5N19) to gym and back with RW and then another 30' with SPC         ADL                                         General ADL Comments: Pt was min guard A for tub transfer to a tub seat, S for toilet transfer and clothing/hygiene, family/friend will A her with LBADLS until she can do them for herself--educated on the most efficient way to do this.               Pertinent Vitals/Pain Pain Assessment: No/denies pain     Hand Dominance Right   Extremity/Trunk Assessment Upper Extremity Assessment Upper Extremity Assessment: Overall WFL for tasks assessed   Lower Extremity Assessment Lower Extremity Assessment: Defer to PT evaluation       Communication Communication Communication: No difficulties   Cognition Arousal/Alertness: Awake/alert Behavior During Therapy: WFL for tasks assessed/performed Overall Cognitive Status: Within Functional Limits for tasks assessed                                Home Living Family/patient  expects to be discharged to:: Private residence Living Arrangements: Spouse/significant other;Non-relatives/Friends Available Help at Discharge: Family;Available 24 hours/day Type of Home: House Home Access: Stairs to enter [employer] of Steps: 1 Entrance Stairs-Rails: None Home Layout: One level     Bathroom Shower/Tub: Tub/shower unit;Curtain Shower/tub characteristics: Architectural technologist: Standard     Home Equipment: Bedside commode;Shower seat;Hand held shower head          Prior Functioning/Environment Level of Independence: Independent             OT Diagnosis: Generalized weakness         OT Goals(Current goals can be found in the care plan section) Acute Rehab OT Goals Patient Stated Goal: home today  OT Frequency:                End of Session Equipment Utilized During Treatment: Gait belt;Rolling walker [facility])  Activity Tolerance: Patient tolerated treatment well Patient left: in chair;with call bell/phone within reach;with family/visitor present   Time: 8099-8338 OT Time Calculation (min): 30 min Charges:  OT General Charges $OT Visit: 1 Procedure OT Evaluation $Initial OT Evaluation Tier I: 1 Procedure OT Treatments $Self Care/Home Management : 23-37 mins  Almon Register [phone] 02/19/2014, 12:11 PM

## 2014-02-19 NOTE — Care Management Note (Signed)
CARE MANAGEMENT NOTE 02/19/2014  Patient:  Monica Kane, Monica Kane   Account Number:  [id number]  Date Initiated:  02/19/2014  Documentation initiated by:  Ricki Miller  Subjective/Objective Assessment:   68 yr old female admitted with right knee DJD. Patient had a right total knee arthroplasty.     Action/Plan:   Patient was preoperatively setup with [place], no changes. Patient has family support at discharge.   Anticipated DC Date:  02/19/2014   Anticipated DC Plan:  [place]  CM consult      [facility] Choice  HOME HEALTH  DURABLE MEDICAL EQUIPMENT   Choice offered to / List presented to:  C-1 Patient   DME arranged  CPM  WALKER - ROLLING  3-N-1      DME agency  [employer]     HH arranged  HH-2 PT      [place].   Status of service:  Completed, signed off Medicare Important Message given?   (If response is "NO", the following Medicare IM given date fields will be blank) Date Medicare IM given:   Medicare IM given by:   Date Additional Medicare IM given:   Additional Medicare IM given by:    Discharge Disposition:  [place]  Per UR Regulation:  Reviewed for med. necessity/level of care/duration of stay  If discussed at [place] of Stay Meetings, dates discussed:

## 2014-02-19 NOTE — Discharge Summary (Signed)
Patient ID: Monica Kane MRN: [id number] DOB/AGE: [birth year] 66 y.o.  Admit date: 02/18/2014 Discharge date: 02/19/2014  Admission Diagnoses:  Principal Problem:   Primary localized osteoarthritis of right knee Active Problems:   Depression   Hypertension   Anxiety   DJD (degenerative joint disease) of knee   Discharge Diagnoses:  Same  Past Medical History  Diagnosis Date  . Hyperlipidemia     takes Crestor nightly   . Primary localized osteoarthritis of right knee   . Hypertension     takes Lisinopril and Coreg daily  . History of staph infection 11/2013  . Neuropathy     takes Elavil nightly  . Joint pain   . Joint swelling   . History of colon polyps   . History of kidney stones   . Depression     takes Zoloft daily  . Anxiety     takes Ativan nightly  . Pneumonia     Surgeries: Procedure(s): TOTAL KNEE ARTHROPLASTY on 02/18/2014   Consultants:    Discharged Condition: Improved  Hospital Course: Monica Kane is an 68 y.o. female who was admitted 02/18/2014 for operative treatment ofPrimary localized osteoarthritis of right knee. Patient has severe unremitting pain that affects sleep, daily activities, and work/hobbies. After pre-op clearance the patient was taken to the operating room on 02/18/2014 and underwent  Procedure(s): TOTAL KNEE ARTHROPLASTY.    Patient was given perioperative antibiotics: Anti-infectives    Start     Dose/Rate Route Frequency Ordered Stop   02/18/14 2000  vancomycin (VANCOCIN) IVPB 1000 mg/200 mL premix     1,000 mg200 mL/hr over 60 Minutes Intravenous Every 12 hours 02/18/14 1653 02/18/14 2155   02/18/14 1256  tobramycin (NEBCIN) powder  Status:  Discontinued       As needed 02/18/14 1256 02/18/14 1418   02/18/14 0600  vancomycin (VANCOCIN) IVPB 1000 mg/200 mL premix     1,000 mg200 mL/hr over 60 Minutes Intravenous On call to O.R. 02/17/14 1327 02/18/14 1242       Patient was given sequential compression devices, early  ambulation, and chemoprophylaxis to prevent DVT.  Patient benefited maximally from hospital stay and there were no complications.    Recent vital signs: Patient Vitals for the past 24 hrs:  BP Temp Temp src Pulse Resp SpO2 Height Weight  02/19/14 0556 (!) 144/64 mmHg 97.9 F (36.6 C) - 65 14 97 % - -  02/19/14 0236 (!) 164/84 mmHg 98.4 F (36.9 C) - 68 14 100 % - -  02/19/14 0000 - - - - 16 - - -  02/18/14 2033 (!) 162/82 mmHg 98.5 F (36.9 C) - 74 14 100 % - -  02/18/14 2000 - - - - 16 - - -  02/18/14 1630 (!) 164/84 mmHg 98.6 F (37 C) Oral 99 14 100 % - -  02/18/14 1600 (!) 180/73 mmHg 98 F (36.7 C) - 67 (!) 6 95 % - -  02/18/14 1545 (!) 195/85 mmHg - - 67 (!) 6 94 % - -  02/18/14 1530 (!) 197/74 mmHg - - 72 17 93 % - -  02/18/14 1515 (!) 206/79 mmHg - - 63 16 100 % - -  02/18/14 1500 (!) 219/100 mmHg - - 72 19 100 % - -  02/18/14 1445 (!) 206/91 mmHg - - 69 13 100 % - -  02/18/14 1430 (!) 200/93 mmHg - - - - - - -  02/18/14 1415 (!) 197/67 mmHg 97.6 F (36.4 C) -  78 - 100 % - -  02/18/14 1155 (!) 127/106 mmHg - - 60 10 100 % - -  02/18/14 1150 (!) 165/77 mmHg - - 64 16 100 % - -  02/18/14 1145 - - - 64 17 100 % - -  02/18/14 1140 - - - 63 16 100 % - -  02/18/14 1135 - - - 64 16 99 % - -  02/18/14 1130 - - - 66 13 100 % - -  02/18/14 1125 - - - 65 (!) 9 99 % - -  02/18/14 1005 (!) 162/74 mmHg 98.8 F (37.1 C) Oral 65 20 98 % 5' 5.75" (1.67 m) 72.122 kg (159 lb)     Recent laboratory studies:  Recent Labs  02/19/14 0734  WBC 8.7  HGB 10.7*  HCT 32.4*  PLT 195  NA 138  K 3.7  CL 101  CO2 26  BUN 6  CREATININE 0.63  GLUCOSE 286*  CALCIUM 8.6     Discharge Medications:     Medication List    TAKE these medications        acetaminophen 325 MG tablet  Commonly known as:  TYLENOL  Take 2 tablets (650 mg total) by mouth every 6 (six) hours as needed for mild pain (or Fever >/= 101).     amitriptyline 25 MG tablet  Commonly known as:  ELAVIL  Take 25 mg  by mouth at bedtime.     carvedilol 3.125 MG tablet  Commonly known as:  COREG  Take 1 tablet (3.125 mg total) by mouth 2 (two) times daily with a meal.     celecoxib 200 MG capsule  Commonly known as:  CELEBREX  Take 1 capsule (200 mg total) by mouth every 12 (twelve) hours.     cholecalciferol 1000 UNITS tablet  Commonly known as:  VITAMIN D  Take 1,000 Units by mouth 2 (two) times daily.     DSS 100 MG Caps  1 tab 2 times a day while on narcotics.  STOOL SOFTENER     lisinopril 10 MG tablet  Commonly known as:  PRINIVIL,ZESTRIL  Take 10 mg by mouth daily.     mupirocin ointment 2 %  Commonly known as:  BACTROBAN  Place 1 application into the nose 2 (two) times daily.     nitroGLYCERIN 0.4 MG SL tablet  Commonly known as:  NITROSTAT  Place 1 tablet (0.4 mg total) under the tongue every 5 (five) minutes as needed for chest pain.     oxyCODONE 5 MG immediate release tablet  Commonly known as:  Oxy IR/ROXICODONE  1-2 tablets every 4-6 hrs as needed for pain SHORT ACTING PAIN MEDS     OxyCODONE 20 mg T12a 12 hr tablet  Commonly known as:  OXYCONTIN  1 PO Q 12 HRS   LONG ACTING PAIN MEDS     polyethylene glycol packet  Commonly known as:  MIRALAX / GLYCOLAX  1 PACKET IN 8 OUNCES OF WATER 2-3 TIMES A DAY     rivaroxaban 10 MG Tabs tablet  Commonly known as:  XARELTO  1 tab a day for the next 30 days to prevent blood clots     rosuvastatin 10 MG tablet  Commonly known as:  CRESTOR  Take 1 tablet (10 mg total) by mouth at bedtime.     sertraline 100 MG tablet  Commonly known as:  ZOLOFT  Take 250 mg by mouth daily.     sodium chloride 0.65 %  Soln nasal spray  Commonly known as:  OCEAN  Place 1 spray into both nostrils daily.     traZODone 50 MG tablet  Commonly known as:  DESYREL  Take 50-100 mg by mouth at bedtime.        Diagnostic Studies: No results found.  Disposition: 01-Home or Self Care      Discharge Instructions    CPM    Complete by:  As  directed   Continuous passive motion machine (CPM):      Use the CPM from 0 to 90 for 6 hours per day.       You may break it up into 2 or 3 sessions per day.      Use CPM for 2 weeks or until you are told to stop.     Call MD / Call 911    Complete by:  As directed   If you experience chest pain or shortness of breath, CALL 911 and be transported to the hospital emergency room.  If you develope a fever above 101 F, pus (white drainage) or increased drainage or redness at the wound, or calf pain, call your surgeon's office.     Change dressing    Complete by:  As directed   Change the dressing daily with sterile 4 x 4 inch gauze dressing and apply TED hose.  You may clean the incision with alcohol prior to redressing.     Constipation Prevention    Complete by:  As directed   Drink plenty of fluids.  Prune juice may be helpful.  You may use a stool softener, such as Colace (over the counter) 100 mg twice a day.  Use MiraLax (over the counter) for constipation as needed.     Diet - low sodium heart healthy    Complete by:  As directed      Discharge instructions    Complete by:  As directed   Total Knee Replacement Care After Refer to this sheet in the next few weeks. These discharge instructions provide you with general information on caring for yourself after you leave the hospital. Your caregiver may also give you specific instructions. Your treatment has been planned according to the most current medical practices available, but unavoidable complications sometimes occur. If you have any problems or questions after discharge, please call your caregiver. Regaining a near full range of motion of your knee within the first 3 to 6 weeks after surgery is critical. [place] may resume a normal diet and activities as directed.  Perform exercises as directed.  Place gray foam block, curve side up under heel at all times except when in CPM or when walking.  DO NOT modify, tear,  cut, or change in any way the gray foam block. You will receive physical therapy daily  Take showers instead of baths until informed otherwise.  You may shower on Wednesday.  Please wash whole leg including wound with soap and water  Keep bandage over wound.  Wash whole leg including over the bandage every day with soap and water. It is OK to take over-the-counter tylenol in addition to the oxycodone for pain, discomfort, or fever. Oxycodone is VERY constipating.  Please take stool softener twice a day and laxatives daily until bowels are regular Eat a well-balanced diet.  Avoid lifting or driving until you are instructed otherwise.  Make an appointment to see your caregiver for stitches (suture) or staple removal as directed.  If you have  been sent home with a continuous passive motion machine (CPM machine), 0-90 degrees 6 hrs a day   2 hrs a shift SEEK MEDICAL CARE IF: You have swelling of your calf or leg.  You develop shortness of breath or chest pain.  You have redness, swelling, or increasing pain in the wound.  There is pus or any unusual drainage coming from the surgical site.  You notice a bad smell coming from the surgical site or dressing.  The surgical site breaks open after sutures or staples have been removed.  There is persistent bleeding from the suture or staple line.  You are getting worse or are not improving.  You have any other questions or concerns.  SEEK IMMEDIATE MEDICAL CARE IF:  You have a fever.  You develop a rash.  You have difficulty breathing.  You develop any reaction or side effects to medicines given.  Your knee motion is decreasing rather than improving.  MAKE SURE YOU:  Understand these instructions.  Will watch your condition.  Will get help right away if you are not doing well or get worse.     Do not put a pillow under the knee. Place it under the heel.    Complete by:  As directed   Place yellow foam block, yellow side up under heel at all times  except when in CPM or when walking.  DO NOT modify, tear, cut, or change in any way the yellow foam block.     Increase activity slowly as tolerated    Complete by:  As directed      TED hose    Complete by:  As directed   Use stockings (TED hose) for 2 weeks on both leg(s).  You may remove them at night for sleeping.           Follow-up Information    Follow up with Lorn Junes, MD On 03/04/2014.   Specialty:  Orthopedic Surgery   Why:  appt time 3pm with Matthew Saras PA-C   Contact information:   [address] [place] [address] [phone]        Signed: Linda Hedges 02/19/2014, 9:33 AM

## 2014-02-19 NOTE — Discharge Instructions (Signed)
Information on my medicine - XARELTO® (Rivaroxaban) ° °This medication education was reviewed with me or my healthcare representative as part of my discharge preparation.  The pharmacist that spoke with me during my hospital stay was:  Montrose, Eleri Ruben Danielle, RPH ° °Why was Xarelto® prescribed for you? °Xarelto® was prescribed for you to reduce the risk of blood clots forming after orthopedic surgery. The medical term for these abnormal blood clots is venous thromboembolism (VTE). ° °What do you need to know about xarelto® ? °Take your Xarelto® ONCE DAILY at the same time every day. °You may take it either with or without food. ° °If you have difficulty swallowing the tablet whole, you may crush it and mix in applesauce just prior to taking your dose. ° °Take Xarelto® exactly as prescribed by your doctor and DO NOT stop taking Xarelto® without talking to the doctor who prescribed the medication.  Stopping without other VTE prevention medication to take the place of Xarelto® may increase your risk of developing a clot. ° °After discharge, you should have regular check-up appointments with your healthcare provider that is prescribing your Xarelto®.   ° °What do you do if you miss a dose? °If you miss a dose, take it as soon as you remember on the same day then continue your regularly scheduled once daily regimen the next day. Do not take two doses of Xarelto® on the same day.  ° °Important Safety Information °A possible side effect of Xarelto® is bleeding. You should call your healthcare provider right away if you experience any of the following: °? Bleeding from an injury or your nose that does not stop. °? Unusual colored urine (red or dark brown) or unusual colored stools (red or black). °? Unusual bruising for unknown reasons. °? A serious fall or if you hit your head (even if there is no bleeding). ° °Some medicines may interact with Xarelto® and might increase your risk of bleeding while on Xarelto®. To help  avoid this, consult your healthcare provider or pharmacist prior to using any new prescription or non-prescription medications, including herbals, vitamins, non-steroidal anti-inflammatory drugs (NSAIDs) and supplements. ° °This website has more information on Xarelto®: www.xarelto.com. ° ° °

## 2014-02-19 NOTE — Plan of Care (Signed)
Problem: Phase I Progression Outcomes Goal: CMS/Neurovascular status WDL Outcome: Completed/Met Date Met:  02/19/14     

## 2014-02-19 NOTE — Evaluation (Signed)
Physical Therapy Evaluation Patient Details Name: Monica Kane MRN: [id number] DOB: 16-Aug-1945 Today's Date: 02/19/2014   History of Present Illness  68 y.o. female admitted to [facility] on 02/18/14 s/p elective R TKA.  Pt with significant PMHx of HTN, neuropathy, depression/anxiety, lateral release R elbow, RTC arthroscopic repair (not sure which side), c-spine surgery, and left foot surgery (for morton's neuroma).  Clinical Impression  Pt is POD #1 and we are already starting cane training.  She is highly motivated to do well and is progressing quickly.  She has already practiced the stairs and walked both with cane and with RW, so reinforced cane training with her and reviewed her joint exercises.  The pt is appropriate for HHPT at discharge and the plan is to d/c home after lunch.     Follow Up Recommendations Home health PT    Equipment Recommendations  None recommended by PT    Recommendations for Other Services   NA    Precautions / Restrictions Precautions Precautions: Knee Precaution Booklet Issued: Yes (comment) Precaution Comments: handout given and reviewed Required Braces or Orthoses: Knee Immobilizer - Right Knee Immobilizer - Right: On when out of bed or walking (PA removed for gait today) Restrictions Weight Bearing Restrictions: No RLE Weight Bearing: Weight bearing as tolerated      Mobility  Bed Mobility               General bed mobility comments: Pt up in recliner upon my arrival  Transfers Overall transfer level: Needs assistance Equipment used: Straight cane Transfers: Sit to/from Stand Sit to Stand: Supervision         General transfer comment: supervision for safety  Ambulation/Gait Ambulation/Gait assistance: Min guard Ambulation Distance (Feet): 85 Feet Assistive device: Straight cane Gait Pattern/deviations: Step-through pattern;Antalgic Gait velocity: decreased Gait velocity interpretation: Below normal speed for age/gender General  Gait Details: Pt with antalgic gait pattern, verbal cues for correct leg sequencing.    Stairs Stairs:  (practiced with PA earlier today)                       Pertinent Vitals/Pain Pain Assessment: No/denies pain    Home Living Family/patient expects to be discharged to:: Private residence Living Arrangements: Spouse/significant other;Non-relatives/Friends Available Help at Discharge: Family;Available 24 hours/day Type of Home: House Home Access: Stairs to enter Entrance Stairs-Rails: None Entrance Stairs-Number of Steps: 1 Home Layout: One level Home Equipment: Bedside commode;Shower seat;Hand held shower head      Prior Function Level of Independence: Independent               Hand Dominance   Dominant Hand: Right    Extremity/Trunk Assessment   Upper Extremity Assessment: Overall WFL for tasks assessed           Lower Extremity Assessment: Defer to PT evaluation         Communication   Communication: No difficulties  Cognition Arousal/Alertness: Awake/alert Behavior During Therapy: WFL for tasks assessed/performed Overall Cognitive Status: Within Functional Limits for tasks assessed                         Exercises Total Joint Exercises Ankle Circles/Pumps: AROM;Both;10 reps;Supine Quad Sets: AROM;Right;10 reps;Supine Towel Squeeze: AROM;Both;10 reps;Supine Short Arc Quad: AROM;Right;10 reps;Supine Heel Slides: AROM;Right;10 reps;Supine Hip ABduction/ADduction: AROM;Right;10 reps;Supine Straight Leg Raises: AROM;Right;10 reps;Supine Long Arc Quad: AROM;Right;10 reps;Supine Knee Flexion: AROM;Right;10 reps;Supine      Assessment/Plan    PT  Assessment Patient needs continued PT services  PT Diagnosis Difficulty walking;Abnormality of gait;Generalized weakness;Acute pain   PT Problem List Decreased strength;Decreased range of motion;Decreased activity tolerance;Decreased balance;Decreased mobility;Decreased knowledge of  use of DME;Decreased knowledge of precautions;Pain  PT Treatment Interventions DME instruction;Gait training;Stair training;Functional mobility training;Therapeutic activities;Therapeutic exercise;Balance training;Neuromuscular re-education;Patient/family education;Manual techniques;Modalities   PT Goals (Current goals can be found in the Care Plan section) Acute Rehab PT Goals Patient Stated Goal: home today PT Goal Formulation: With patient Time For Goal Achievement: 02/26/14 Potential to Achieve Goals: Good    Frequency 7X/week    End of Session Equipment Utilized During Treatment: Gait belt Activity Tolerance: Patient limited by fatigue;Patient limited by pain Patient left: in chair;with call bell/phone within reach;with family/visitor present Nurse Communication: Mobility status         Time: 1020-1103 PT Time Calculation (min): 43 min   Charges:   PT Evaluation $Initial PT Evaluation Tier I: 1 Procedure PT Treatments $Gait Training: 8-22 mins $Therapeutic Exercise: 8-22 mins        Savan Ruta B. Aricela Bertagnolli, PT, DPT [phone]   02/19/2014, 2:08 PM

## 2014-02-19 NOTE — Plan of Care (Signed)
Problem: Phase I Progression Outcomes Goal: Pain controlled with appropriate interventions Outcome: Completed/Met Date Met:  02/19/14 Goal: Initial discharge plan identified Outcome: Completed/Met Date Met:  02/19/14 Goal: Hemodynamically stable Outcome: Completed/Met Date Met:  02/19/14 Goal: Other Phase I Outcomes/Goals Outcome: Completed/Met Date Met:  02/19/14  Problem: Phase II Progression Outcomes Goal: Ambulates Outcome: Completed/Met Date Met:  02/19/14 Goal: Tolerating diet Outcome: Completed/Met Date Met:  02/19/14 Goal: Discharge plan established Outcome: Completed/Met Date Met:  02/19/14 Goal: Other Phase II Outcomes/Goals Outcome: Not Applicable Date Met:  59/74/16  Problem: Phase III Progression Outcomes Goal: Pain controlled on oral analgesia Outcome: Completed/Met Date Met:  02/19/14 Goal: Ambulates Outcome: Completed/Met Date Met:  02/19/14 Goal: Incision clean - minimal/no drainage Outcome: Completed/Met Date Met:  02/19/14 Goal: Discharge plan remains appropriate-arrangements made Outcome: Completed/Met Date Met:  02/19/14 Goal: Anticoagulant follow-up in place Outcome: Completed/Met Date Met:  02/19/14 Goal: Other Phase III Outcomes/Goals Outcome: Not Applicable Date Met:  38/45/36  Problem: Discharge Progression Outcomes Goal: Barriers To Progression Addressed/Resolved Outcome: Completed/Met Date Met:  02/19/14 Goal: CMS/Neurovascular status at or above baseline Outcome: Completed/Met Date Met:  02/19/14 Goal: Anticoagulant follow-up in place Outcome: Completed/Met Date Met:  02/19/14 Goal: Pain controlled with appropriate interventions Outcome: Completed/Met Date Met:  02/19/14 Goal: Hemodynamically stable Outcome: Completed/Met Date Met:  46/80/32 Goal: Complications resolved/controlled Outcome: Completed/Met Date Met:  02/19/14 Goal: Tolerates diet Outcome: Completed/Met Date Met:  02/19/14 Goal: Activity appropriate for discharge  plan Outcome: Completed/Met Date Met:  02/19/14 Goal: Ambulates safely using assistive device Outcome: Completed/Met Date Met:  02/19/14 Goal: Follows weight - bearing limitations Outcome: Completed/Met Date Met:  02/19/14 Goal: Discharge plan in place and appropriate Outcome: Completed/Met Date Met:  02/19/14 Goal: Negotiates stairs Outcome: Completed/Met Date Met:  02/19/14 Goal: Demonstrates ADLs as appropriate Outcome: Completed/Met Date Met:  02/19/14 Goal: Incision without S/S infection Outcome: Completed/Met Date Met:  02/19/14 Goal: Other Discharge Outcomes/Goals Outcome: Not Applicable Date Met:  04/11/81

## 2014-02-21 ENCOUNTER — Other Ambulatory Visit: Payer: Self-pay

## 2014-02-21 ENCOUNTER — Encounter (HOSPITAL_COMMUNITY): Payer: Self-pay | Admitting: Orthopedic Surgery

## 2014-02-21 MED ORDER — LISINOPRIL 10 MG PO TABS: 10.0000 mg | ORAL_TABLET | Freq: Every day | ORAL | Status: DC

## 2014-03-22 ENCOUNTER — Other Ambulatory Visit: Payer: Self-pay | Admitting: Emergency Medicine

## 2014-03-26 ENCOUNTER — Encounter: Payer: Self-pay | Admitting: Emergency Medicine

## 2014-03-26 ENCOUNTER — Other Ambulatory Visit: Payer: Self-pay | Admitting: Family Medicine

## 2014-03-26 ENCOUNTER — Ambulatory Visit (INDEPENDENT_AMBULATORY_CARE_PROVIDER_SITE_OTHER): Payer: 59 | Admitting: Emergency Medicine

## 2014-03-26 VITALS — BP 110/68 | HR 74 | Temp 98.4°F | Resp 16 | Ht 65.5 in | Wt 159.8 lb

## 2014-03-26 DIAGNOSIS — Z13220 Encounter for screening for lipoid disorders: Secondary | ICD-10-CM

## 2014-03-26 DIAGNOSIS — R110 Nausea: Secondary | ICD-10-CM

## 2014-03-26 DIAGNOSIS — D229 Melanocytic nevi, unspecified: Secondary | ICD-10-CM

## 2014-03-26 DIAGNOSIS — R531 Weakness: Secondary | ICD-10-CM

## 2014-03-26 LAB — LIPID PANEL
CHOL/HDL RATIO: 3.3 ratio
Cholesterol: 179 mg/dL (ref 0–200)
HDL: 55 mg/dL (ref >39)
LDL CALC: 98 mg/dL (ref 0–99)
TRIGLYCERIDES: 132 mg/dL (ref <150)
VLDL: 26 mg/dL (ref 0–40)

## 2014-03-26 LAB — COMPLETE METABOLIC PANEL WITH GFR
ALT: 13 U/L (ref 0–35)
AST: 21 U/L (ref 0–37)
Albumin: 4.3 g/dL (ref 3.5–5.2)
Alkaline Phosphatase: 78 U/L (ref 39–117)
BILIRUBIN TOTAL: 0.5 mg/dL (ref 0.2–1.2)
BUN: 13 mg/dL (ref 6–23)
CO2: 24 mEq/L (ref 19–32)
Calcium: 10.1 mg/dL (ref 8.4–10.5)
Chloride: 103 mEq/L (ref 96–112)
Creat: 0.83 mg/dL (ref 0.50–1.10)
GFR, EST AFRICAN AMERICAN: 85 mL/min
GFR, Est Non African American: 74 mL/min
Glucose, Bld: 101 mg/dL — ABNORMAL HIGH (ref 70–99)
Potassium: 4.4 mEq/L (ref 3.5–5.3)
Sodium: 136 mEq/L (ref 135–145)
Total Protein: 6.9 g/dL (ref 6.0–8.3)

## 2014-03-26 LAB — CBC WITH DIFFERENTIAL/PLATELET
BASOS PCT: 1 % (ref 0–1)
Basophils Absolute: 0 10*3/uL (ref 0.0–0.1)
EOS ABS: 0.1 10*3/uL (ref 0.0–0.7)
Eosinophils Relative: 2 % (ref 0–5)
HEMATOCRIT: 38.4 % (ref 36.0–46.0)
Hemoglobin: 12.7 g/dL (ref 12.0–15.0)
Lymphocytes Relative: 30 % (ref 12–46)
Lymphs Abs: 1.5 10*3/uL (ref 0.7–4.0)
MCH: 26 pg (ref 26.0–34.0)
MCHC: 33.1 g/dL (ref 30.0–36.0)
MCV: 78.5 fL (ref 78.0–100.0)
MONOS PCT: 5 % (ref 3–12)
MPV: 10.5 fL (ref 9.4–12.4)
Monocytes Absolute: 0.2 10*3/uL (ref 0.1–1.0)
NEUTROS PCT: 62 % (ref 43–77)
Neutro Abs: 3 10*3/uL (ref 1.7–7.7)
Platelets: 255 10*3/uL (ref 150–400)
RBC: 4.89 MIL/uL (ref 3.87–5.11)
RDW: 14.6 % (ref 11.5–15.5)
WBC: 4.9 10*3/uL (ref 4.0–10.5)

## 2014-03-26 LAB — POCT GLYCOSYLATED HEMOGLOBIN (HGB A1C): Hemoglobin A1C: 5.7

## 2014-03-26 LAB — GLUCOSE, POCT (MANUAL RESULT ENTRY): POC Glucose: 112 mg/dl — AB (ref 70–99)

## 2014-03-26 MED ORDER — ONDANSETRON 8 MG PO TBDP: 8.0000 mg | ORAL_TABLET | Freq: Three times a day (TID) | ORAL | Status: DC | PRN

## 2014-03-26 NOTE — Progress Notes (Addendum)
Subjective:  This chart was scribed for Arlyss Queen, MD by Donato Schultz, Medical Scribe. This patient was seen in Room 23 and the patient's care was started at 2:36 PM.   Patient ID: Monica Kane, female    DOB: [birth year], 68 y.o.   MRN: [id number]  HPI HPI Comments: Monica Kane is a 68 y.o. female who presents to the [facility] for lab work to determine her hemoglobin levels.  She had a total knee arthroplasty five weeks ago.  She is not in any pain currently and is doing physical therapy.  She had a drastic increase in her glucose levels following her surgery but denies receiving a shot of steroids.  She has not had an appetite since the surgery and become nauseous after eating.  She has been taking Oxycodone and Tylenol for pain.  She does not take more than four 10mg  Oxycodone daily.  She has been experiencing some constipation but suspects it is due to her pain medication.  She took Miralax but got diarrhea.  She has a nurse aid helping her around the house.  She has already received her flu shot.    Past Medical History  Diagnosis Date  . Hyperlipidemia     takes Crestor nightly   . Primary localized osteoarthritis of right knee   . Hypertension     takes Lisinopril and Coreg daily  . History of staph infection 11/2013  . Neuropathy     takes Elavil nightly  . Joint pain   . Joint swelling   . History of colon polyps   . History of kidney stones   . Depression     takes Zoloft daily  . Anxiety     takes Ativan nightly  . Pneumonia    Past Surgical History  Procedure Laterality Date  . Abdominal hysterectomy  1989  . Lateral release right elbow  2010  . Shoulder arthroscopy w/ rotator cuff repair  2006  . Cervical spine surgery  2004    C 5-6  . Parotid gland tumor excision  1976    benign  . Diagnostic laparoscopy  1976    fibroid removed  . Appendectomy    . Colonoscopy    . Shoulder arthroscopy distal clavicle excision and open rotator  cuff repair  2006  . Parotid gland tumor excision  1976    rt  . Lateral epicondyle release  03/01/2011    Procedure: TENNIS ELBOW RELEASE;  Surgeon: Lorn Junes, MD;  Location: [place];  Service: Orthopedics;  Laterality: Left;  TENOTOMY ELBOW LATERAL EPICONDYLITIS TENNIS ELBOW  . Knee arthroscopy w/ meniscectomy Right 01/31/2013    [facility]  . Left foot surgery  2013    mortons neuroma  . Cardiac catheterization  2015  . Eye surgery Bilateral     cataract  . Colonscopy    . Elbow arthroscopy  2010, 2012  . Knee arthroscopy  Oct 2014  . Foot surgery Left 2013  . Total knee arthroplasty Right 02/18/2014    Procedure: TOTAL KNEE ARTHROPLASTY;  Surgeon: Lorn Junes, MD;  Location: [place];  Service: Orthopedics;  Laterality: Right;   Family History  Problem Relation Age of Onset  . Cancer Mother     lung  . Cancer Father     lung; + tobacco  . Heart disease Maternal Grandfather     heart attack  . Heart disease Paternal Grandfather     heart attack  .  Diabetes Sister   . Hypertension Sister   . Hyperlipidemia Sister    History   Social History  . Marital Status: Married    Spouse Name: Erendira Crabtree    Number of Children: N/A  . Years of Education: N/A   Occupational History  . Not on file.   Social History Main Topics  . Smoking status: Never Smoker   . Smokeless tobacco: Never Used  . Alcohol Use: Yes     Comment: 1-2 drinks weekly  . Drug Use: No  . Sexual Activity: Yes    Birth Control/ Protection: Surgical   Other Topics Concern  . Not on file   Social History Narrative   Lives with her husband.  He has 2 adult children. Patient has a Financial risk analyst, and she does exercise.   Allergies  Allergen Reactions  . Penicillins Swelling and Rash    Review of Systems  Constitutional: Positive for appetite change (decreased).  Gastrointestinal: Positive for nausea, abdominal pain, diarrhea and constipation.     Objective:    Physical Exam  Constitutional: She is oriented to person, place, and time. She appears well-developed and well-nourished.  Slightly pale.   HENT:  Head: Normocephalic and atraumatic.  Eyes: EOM are normal.  Neck: Normal range of motion. Neck supple. No thyromegaly present.  Cardiovascular: Normal rate, regular rhythm and normal heart sounds.   No murmur heard. Pulmonary/Chest: Effort normal and breath sounds normal. No respiratory distress. She has no wheezes. She has no rales.  Abdominal: Soft. There is no tenderness.  Musculoskeletal: Normal range of motion.  Lymphadenopathy:    She has no cervical adenopathy.  Neurological: She is alert and oriented to person, place, and time.  Skin: Skin is warm and dry.  Well-healed scar over the right knee.  Psychiatric: She has a normal mood and affect. Her behavior is normal.  Nursing note and vitals reviewed.  Results for orders placed or performed in visit on 03/26/14  POCT glycosylated hemoglobin (Hb A1C)  Result Value Ref Range   Hemoglobin A1C 5.7   POCT glucose (manual entry)  Result Value Ref Range   POC Glucose 112 (A) 70 - 99 mg/dl    BP 110/68 mmHg  Pulse 74  Temp(Src) 98.4 F (36.9 C) (Oral)  Resp 16  Ht 5' 5.5" (1.664 m)  Wt 159 lb 12.8 oz (72.485 kg)  BMI 26.18 kg/m2  SpO2 97% Assessment & Plan:  Will check routine labs today.  Her flu shot is UTD.  She is on Oxycodone 10mg  QID with nausea and decreased appetite.  Will give her some Zofran and encourage her to decrease her opiate use.  Her sugar was elevated at 286 on 02/19/14.  Fasting sugar done today along with Hemoglobin A1C. I personally performed the services described in this documentation, which was scribed in my presence. The recorded information has been reviewed and is accurate.

## 2014-03-28 ENCOUNTER — Encounter (HOSPITAL_COMMUNITY): Payer: Self-pay | Admitting: Interventional Cardiology

## 2014-04-08 ENCOUNTER — Other Ambulatory Visit: Payer: Self-pay | Admitting: Family Medicine

## 2014-04-10 ENCOUNTER — Ambulatory Visit (INDEPENDENT_AMBULATORY_CARE_PROVIDER_SITE_OTHER): Payer: 59 | Admitting: Family Medicine

## 2014-04-10 VITALS — BP 134/64 | HR 70 | Temp 98.3°F | Resp 18 | Ht 65.5 in | Wt 161.0 lb

## 2014-04-10 DIAGNOSIS — Z0000 Encounter for general adult medical examination without abnormal findings: Secondary | ICD-10-CM

## 2014-04-10 DIAGNOSIS — N898 Other specified noninflammatory disorders of vagina: Secondary | ICD-10-CM

## 2014-04-10 DIAGNOSIS — Z1389 Encounter for screening for other disorder: Secondary | ICD-10-CM

## 2014-04-10 DIAGNOSIS — L989 Disorder of the skin and subcutaneous tissue, unspecified: Secondary | ICD-10-CM

## 2014-04-10 DIAGNOSIS — Z139 Encounter for screening, unspecified: Secondary | ICD-10-CM

## 2014-04-10 LAB — POCT URINALYSIS DIPSTICK
Bilirubin, UA: NEGATIVE
GLUCOSE UA: NEGATIVE
Ketones, UA: NEGATIVE
Nitrite, UA: NEGATIVE
Protein, UA: NEGATIVE
SPEC GRAV UA: 1.02
Urobilinogen, UA: 0.2
pH, UA: 5.5

## 2014-04-10 NOTE — Patient Instructions (Signed)
For vaginal dryness can try the following- Yes K-Y liquibeads, OTC Replens, Astroglide.

## 2014-04-10 NOTE — Progress Notes (Signed)
Subjective:    Patient ID: Monica Kane, female    DOB: 17-Oct-1945, 68 y.o.   MRN: [id number]  HPI Patient presents today for CPE. She had TKR on her right knee 7 weeks ago. She is doing well. She anticipates resuming her ballroom dancing in 5-10 weeks. Her energy level is improved.   Flu- 9/15 Tdap- 11/09 Colonoscopy- 2/15 Mammo- 2015 Eye- annual Dentist- regular  Has had painful intercourse for awhile. Has tried estrogen cream for several months with no improvement. Uses OTC lubricants with minimal relief. Feels dry. No urinary incontinence.   Past Medical History  Diagnosis Date  . Hyperlipidemia     takes Crestor nightly   . Primary localized osteoarthritis of right knee   . Hypertension     takes Lisinopril and Coreg daily  . History of staph infection 11/2013  . Neuropathy     takes Elavil nightly  . Joint pain   . Joint swelling   . History of colon polyps   . History of kidney stones   . Depression     takes Zoloft daily  . Anxiety     takes Ativan nightly  . Pneumonia    Past Surgical History  Procedure Laterality Date  . Abdominal hysterectomy  1989  . Lateral release right elbow  2010  . Shoulder arthroscopy w/ rotator cuff repair  2006  . Cervical spine surgery  2004    C 5-6  . Parotid gland tumor excision  1976    benign  . Diagnostic laparoscopy  1976    fibroid removed  . Appendectomy    . Colonoscopy    . Shoulder arthroscopy distal clavicle excision and open rotator cuff repair  2006  . Parotid gland tumor excision  1976    rt  . Lateral epicondyle release  03/01/2011    Procedure: TENNIS ELBOW RELEASE;  Surgeon: Lorn Junes, MD;  Location: [place];  Service: Orthopedics;  Laterality: Left;  TENOTOMY ELBOW LATERAL EPICONDYLITIS TENNIS ELBOW  . Knee arthroscopy w/ meniscectomy Right 01/31/2013    [facility]  . Left foot surgery  2013    mortons neuroma  . Cardiac catheterization  2015  . Eye surgery Bilateral    cataract  . Colonscopy    . Elbow arthroscopy  2010, 2012  . Knee arthroscopy  Oct 2014  . Foot surgery Left 2013  . Total knee arthroplasty Right 02/18/2014    Procedure: TOTAL KNEE ARTHROPLASTY;  Surgeon: Lorn Junes, MD;  Location: [place];  Service: Orthopedics;  Laterality: Right;  . Left heart catheterization with coronary angiogram N/A 07/23/2013    Procedure: LEFT HEART CATHETERIZATION WITH CORONARY ANGIOGRAM;  Surgeon: Sinclair Grooms, MD;  Location: [facility];  Service: Cardiovascular;  Laterality: N/A;   Family History  Problem Relation Age of Onset  . Cancer Mother     lung  . Cancer Father     lung; + tobacco  . Heart disease Maternal Grandfather     heart attack  . Heart disease Paternal Grandfather     heart attack  . Diabetes Sister   . Hypertension Sister   . Hyperlipidemia Sister    History  Substance Use Topics  . Smoking status: Never Smoker   . Smokeless tobacco: Never Used  . Alcohol Use: Yes     Comment: 1-2 drinks weekly   Review of Systems  Constitutional: Negative.   HENT: Negative.   Eyes: Positive for photophobia, redness  and itching.  Respiratory: Negative.   Cardiovascular: Negative.   Gastrointestinal: Negative.   Endocrine: Negative.   Genitourinary: Positive for dyspareunia.  Allergic/Immunologic: Negative.   Neurological: Negative.   Hematological: Negative.   Psychiatric/Behavioral: Negative.       Objective:   Physical Exam  Constitutional: She is oriented to person, place, and time. She appears well-developed and well-nourished.  HENT:  Head: Normocephalic and atraumatic.  Right Ear: External ear normal.  Left Ear: External ear normal.  Nose: Nose normal.  Mouth/Throat: Oropharynx is clear and moist.  Eyes: Conjunctivae are normal. Pupils are equal, round, and reactive to light.  Neck: Normal range of motion. Neck supple.  Cardiovascular: Normal rate, regular rhythm and normal heart sounds.   Pulmonary/Chest: Effort  normal and breath sounds normal.  Abdominal: Soft. Bowel sounds are normal. She exhibits no distension and no mass. There is no tenderness. There is no rebound and no guarding.  Genitourinary: Vagina normal. No vaginal discharge found.  Musculoskeletal: Normal range of motion. She exhibits no edema or tenderness.  Neurological: She is alert and oriented to person, place, and time.  Skin: Skin is warm and dry.  Left knee surgical scar well healed. She has a left shin skin lesion- brown, slightly raised, approximately 1 cm. Upper left chest with raised, light pink lesion, approximately 1 cm.    Psychiatric: She has a normal mood and affect. Her behavior is normal. Judgment and thought content normal.  Vitals reviewed. BP 134/64 mmHg  Pulse 70  Temp(Src) 98.3 F (36.8 C)  Resp 18  Ht 5' 5.5" (1.664 m)  Wt 161 lb (73.029 kg)  BMI 26.37 kg/m2  SpO2 98%    Assessment & Plan:  1. Annual physical exam -Patient had labs drawn earlier this month.  2. Vaginal dryness -Discussed OTC products for her to try  3. Skin lesion of chest wall - Ambulatory referral to Dermatology  4. Screening for hematuria or proteinuria - Urinalysis Dipstick  Elby Beck, FNP-BC  [facility]  04/10/2014 2:33 PM

## 2014-04-13 ENCOUNTER — Encounter: Payer: Self-pay | Admitting: Family Medicine

## 2014-04-16 ENCOUNTER — Encounter: Payer: 59 | Admitting: Emergency Medicine

## 2014-04-22 ENCOUNTER — Other Ambulatory Visit: Payer: Self-pay | Admitting: Emergency Medicine

## 2014-04-22 ENCOUNTER — Other Ambulatory Visit: Payer: Self-pay | Admitting: Physician Assistant

## 2014-04-22 ENCOUNTER — Other Ambulatory Visit: Payer: Self-pay | Admitting: Family Medicine

## 2014-04-22 NOTE — Telephone Encounter (Signed)
Will you just double check that she isn't also taking Trazadone? Would she like 30 day supply or 90 day?

## 2014-04-22 NOTE — Telephone Encounter (Signed)
Monica Kane, pt was just here for CPE, but I don't see this med discussed. Do you want to RF?

## 2014-04-25 ENCOUNTER — Other Ambulatory Visit: Payer: Self-pay | Admitting: Family Medicine

## 2014-04-25 NOTE — Telephone Encounter (Signed)
Monica Kane, pt saw you in Dec for CPE but I don't see this med discussed. Do you want to give RFs?

## 2014-04-26 NOTE — Addendum Note (Signed)
Addended by: Elwyn Reach A on: 04/26/2014 03:38 PM   Modules accepted: Orders

## 2014-04-26 NOTE — Telephone Encounter (Signed)
I saw that Monica Kane had RFd this for 30 days, and must not have seen this note. I called [facility] and cancelled the 30 day Rx until you have a chance to review. If OKd, pt would rather have 90 day RFs.

## 2014-04-26 NOTE — Telephone Encounter (Signed)
Spoke to pt and she stated that she is also taking the trazadone. She takes the amitriptyline for the nerve pain in her foot and has been taking both together since 2013 with no adverse SEs. She would prefer a 90 day supply if possible. Please advise.

## 2014-04-30 MED ORDER — AMITRIPTYLINE HCL 25 MG PO TABS: ORAL_TABLET | ORAL | Status: DC

## 2014-05-13 ENCOUNTER — Other Ambulatory Visit: Payer: Self-pay

## 2014-05-13 DIAGNOSIS — Z1231 Encounter for screening mammogram for malignant neoplasm of breast: Secondary | ICD-10-CM

## 2014-05-15 ENCOUNTER — Other Ambulatory Visit: Payer: Self-pay | Admitting: Dermatology

## 2014-05-20 ENCOUNTER — Ambulatory Visit
Admission: RE | Admit: 2014-05-20 | Discharge: 2014-05-20 | Disposition: A | Discharge status: Home / Self Care | Payer: 59 | Source: Ambulatory Visit

## 2014-05-20 DIAGNOSIS — Z1231 Encounter for screening mammogram for malignant neoplasm of breast: Secondary | ICD-10-CM

## 2014-06-21 ENCOUNTER — Ambulatory Visit (INDEPENDENT_AMBULATORY_CARE_PROVIDER_SITE_OTHER): Payer: Commercial Managed Care - PPO | Admitting: Physician Assistant

## 2014-06-21 VITALS — BP 130/62 | HR 75 | Temp 98.3°F | Resp 16 | Ht 65.0 in | Wt 162.0 lb

## 2014-06-21 DIAGNOSIS — J019 Acute sinusitis, unspecified: Secondary | ICD-10-CM

## 2014-06-21 MED ORDER — GUAIFENESIN ER 1200 MG PO TB12: 1.0000 | ORAL_TABLET | Freq: Two times a day (BID) | ORAL | Status: DC | PRN

## 2014-06-21 MED ORDER — AZITHROMYCIN 250 MG PO TABS: ORAL_TABLET | ORAL | Status: DC

## 2014-06-21 NOTE — Progress Notes (Signed)
Subjective:    Patient ID: Monica Kane, female    DOB: 04/11/1946, 69 y.o.   MRN: [id number]  HPI Patient presents with nasal congestion and fatigue that have been present for 2 days. Is worried because she had knee replacement 4 months ago and surgeon cautioned that any possible infection whether URI or UTI should be treated with antibiotics. Timeframe given for infections postop of other origin than knee was up to 6 months. Endorses rhinorrhea, sore throat, sinus pressure, sweating, and feeling drained of energy. Denies cough, ear pressure, fever, chills, N/V, or wheezing. No h/o asthma or allergies and is not a smoker. Has not tried any medications for relief. No known sick contacts. Med allergy to PCN.   Review of Systems  Constitutional: Positive for diaphoresis and fatigue. Negative for fever and chills.  HENT: Positive for congestion, postnasal drip, rhinorrhea, sinus pressure and sore throat. Negative for ear discharge, ear pain, sneezing, trouble swallowing and voice change.   Eyes: Negative.   Respiratory: Negative for cough and shortness of breath.   Cardiovascular: Negative for chest pain.  Gastrointestinal: Negative for nausea, vomiting and abdominal pain.  Musculoskeletal: Negative for neck pain.  Neurological: Negative for dizziness and headaches.       Objective:   Physical Exam  Constitutional: She is oriented to person, place, and time. She appears well-developed and well-nourished. No distress.  Blood pressure 130/62, pulse 75, temperature 98.3 F (36.8 C), resp. rate 16, height 5\' 5"  (1.651 m), weight 162 lb (73.483 kg), SpO2 94 %.  HENT:  Head: Normocephalic and atraumatic.  Right Ear: Tympanic membrane, external ear and ear canal normal.  Left Ear: Tympanic membrane, external ear and ear canal normal.  Nose: Rhinorrhea (with marginal erythema) present. No mucosal edema or sinus tenderness. Right sinus exhibits maxillary sinus tenderness. Right sinus exhibits  no frontal sinus tenderness. Left sinus exhibits no maxillary sinus tenderness and no frontal sinus tenderness.  Mouth/Throat: Uvula is midline and mucous membranes are normal. Posterior oropharyngeal erythema (marginal) present. No oropharyngeal exudate, posterior oropharyngeal edema or tonsillar abscesses.  Eyes: Conjunctivae and EOM are normal. Pupils are equal, round, and reactive to light. Right eye exhibits no discharge. Left eye exhibits no discharge. No scleral icterus.  Neck: Neck supple. No thyromegaly present.  Cardiovascular: Normal rate and regular rhythm.  Exam reveals no gallop and no friction rub.   No murmur heard. Pulmonary/Chest: Effort normal and breath sounds normal. No respiratory distress. She has no decreased breath sounds. She has no wheezes. She has no rhonchi. She has no rales.  Abdominal: Soft. Bowel sounds are normal. There is no tenderness.  Lymphadenopathy:    She has no cervical adenopathy.  Neurological: She is alert and oriented to person, place, and time.  Skin: Skin is warm and dry. No rash noted. She is not diaphoretic. No erythema. No pallor.       Assessment & Plan:  1. Acute sinusitis, recurrence not specified, unspecified location Take Mucinex with plenty of fluid. Should discuss with surgeon if antibiotics are currently needed and if URI are one of the infections he wanted covered.  - azithromycin (ZITHROMAX) 250 MG tablet; Take 2 tabs PO x 1 dose, then 1 tab PO QD x 4 days  Dispense: 6 tablet; Refill: 0 - Guaifenesin (MUCINEX MAXIMUM STRENGTH) 1200 MG TB12; Take 1 tablet (1,200 mg total) by mouth every 12 (twelve) hours as needed.  Dispense: 14 tablet; Refill: [place] PA-C  [facility] 06/21/2014 4:21 PM

## 2014-06-21 NOTE — Patient Instructions (Signed)

## 2014-07-29 ENCOUNTER — Other Ambulatory Visit: Payer: Self-pay | Admitting: Orthopedic Surgery

## 2014-07-29 DIAGNOSIS — M25552 Pain in left hip: Secondary | ICD-10-CM

## 2014-07-30 ENCOUNTER — Ambulatory Visit (INDEPENDENT_AMBULATORY_CARE_PROVIDER_SITE_OTHER): Payer: 59 | Admitting: Emergency Medicine

## 2014-07-30 VITALS — BP 114/70 | HR 82 | Temp 98.5°F | Resp 18 | Ht 66.0 in | Wt 161.0 lb

## 2014-07-30 DIAGNOSIS — L0100 Impetigo, unspecified: Secondary | ICD-10-CM | POA: Diagnosis not present

## 2014-07-30 MED ORDER — SULFAMETHOXAZOLE-TRIMETHOPRIM 800-160 MG PO TABS: 1.0000 | ORAL_TABLET | Freq: Two times a day (BID) | ORAL | Status: DC

## 2014-07-30 MED ORDER — MUPIROCIN 2 % EX OINT: 1.0000 "application " | TOPICAL_OINTMENT | Freq: Three times a day (TID) | CUTANEOUS | Status: DC

## 2014-07-30 NOTE — Patient Instructions (Signed)

## 2014-07-30 NOTE — Addendum Note (Signed)
Addended by: Sunday Spillers on: 07/30/2014 06:15 PM   Modules accepted: Orders

## 2014-07-30 NOTE — Progress Notes (Signed)
[facility] [address] [place] [address] [phone]  Date:  07/30/2014   Name:  Monica Kane   DOB:  [birth year]   MRN:  [id number]  PCP:  Jenny Reichmann, MD    Chief Complaint: Blister   History of Present Illness:  Monica Kane is a 69 y.o. very pleasant female patient who presents with the following:  Patient has a history of staph on the face and nasal passages. Noticed 12 days ago  recurrent pustular eruption on her chin and nose No fever or chills No nausea or vomiting No cough or coryza. No improvement with over the counter medications or other home remedies.  Denies other complaint or health concern today.   Patient Active Problem List   Diagnosis Date Noted  . DJD (degenerative joint disease) of knee 02/18/2014  . Cataract extraction status of left eye 01/16/2014  . Primary localized osteoarthritis of right knee   . Anxiety   . Chest pain, midsternal 07/23/2013  . Osteopenia 03/23/2013  . Kidney stones, calcium oxalate 03/23/2013  . Hypertension 03/31/2011  . Hyperlipidemia 03/31/2011  . Vitamin d deficiency 03/31/2011  . Hyperglycemia 03/31/2011  . Foot pain, left 03/24/2011  . Hallux rigidus 03/24/2011  . Metatarsalgia of left foot 03/24/2011  . Lateral epicondylitis of left elbow 02/24/2011  . Migraine 02/24/2011  . Depression 02/24/2011  . Cataract extraction status of right eye 02/24/2011  . Arthritis 02/24/2011    Past Medical History  Diagnosis Date  . Hyperlipidemia     takes Crestor nightly   . Primary localized osteoarthritis of right knee   . Hypertension     takes Lisinopril and Coreg daily  . History of staph infection 11/2013  . Neuropathy     takes Elavil nightly  . Joint pain   . Joint swelling   . History of colon polyps   . History of kidney stones   . Depression     takes Zoloft daily  . Anxiety     takes Ativan nightly  . Pneumonia     Past Surgical History  Procedure Laterality Date   . Abdominal hysterectomy  1989  . Lateral release right elbow  2010  . Shoulder arthroscopy w/ rotator cuff repair  2006  . Cervical spine surgery  2004    C 5-6  . Parotid gland tumor excision  1976    benign  . Diagnostic laparoscopy  1976    fibroid removed  . Appendectomy    . Colonoscopy    . Shoulder arthroscopy distal clavicle excision and open rotator cuff repair  2006  . Parotid gland tumor excision  1976    rt  . Lateral epicondyle release  03/01/2011    Procedure: TENNIS ELBOW RELEASE;  Surgeon: Lorn Junes, MD;  Location: [place];  Service: Orthopedics;  Laterality: Left;  TENOTOMY ELBOW LATERAL EPICONDYLITIS TENNIS ELBOW  . Knee arthroscopy w/ meniscectomy Right 01/31/2013    Noemi [facility]  . Left foot surgery  2013    mortons neuroma  . Cardiac catheterization  2015  . Eye surgery Bilateral     cataract  . Colonscopy    . Elbow arthroscopy  2010, 2012  . Knee arthroscopy  Oct 2014  . Foot surgery Left 2013  . Total knee arthroplasty Right 02/18/2014    Procedure: TOTAL KNEE ARTHROPLASTY;  Surgeon: Lorn Junes, MD;  Location: [place];  Service: Orthopedics;  Laterality: Right;  .  Left heart catheterization with coronary angiogram N/A 07/23/2013    Procedure: LEFT HEART CATHETERIZATION WITH CORONARY ANGIOGRAM;  Surgeon: Sinclair Grooms, MD;  Location: [facility] CATH LAB;  Service: Cardiovascular;  Laterality: N/A;    History  Substance Use Topics  . Smoking status: Never Smoker   . Smokeless tobacco: Never Used  . Alcohol Use: Yes     Comment: 1-2 drinks weekly    Family History  Problem Relation Age of Onset  . Cancer Mother     lung  . Cancer Father     lung; + tobacco  . Heart disease Maternal Grandfather     heart attack  . Heart disease Paternal Grandfather     heart attack  . Diabetes Sister   . Hypertension Sister   . Hyperlipidemia Sister     Allergies  Allergen Reactions  . Penicillins Swelling and Rash    Medication list  has been reviewed and updated.  Current Outpatient Prescriptions on File Prior to Visit  Medication Sig Dispense Refill  . amitriptyline (ELAVIL) 25 MG tablet TAKE ONE TABLET AT BEDTIME. 90 tablet 1  . carvedilol (COREG) 3.125 MG tablet TAKE 1 TABLET TWICE DAILY WITH FOOD. 60 tablet 5  . celecoxib (CELEBREX) 200 MG capsule Take 1 capsule (200 mg total) by mouth every 12 (twelve) hours. 30 capsule 3  . cholecalciferol (VITAMIN D) 1000 UNITS tablet Take 1,000 Units by mouth 2 (two) times daily.     . CRESTOR 10 MG tablet TAKE 1 TABLET DAILY. 30 tablet 5  . Guaifenesin (MUCINEX MAXIMUM STRENGTH) 1200 MG TB12 Take 1 tablet (1,200 mg total) by mouth every 12 (twelve) hours as needed. 14 tablet 1  . lisinopril (PRINIVIL,ZESTRIL) 10 MG tablet TAKE 1 TABLET DAILY. 30 tablet 5  . nitroGLYCERIN (NITROSTAT) 0.4 MG SL tablet Place 1 tablet (0.4 mg total) under the tongue every 5 (five) minutes as needed for chest pain. 14 tablet 0  . sertraline (ZOLOFT) 100 MG tablet Take 250 mg by mouth daily.     . traZODone (DESYREL) 50 MG tablet Take 50-100 mg by mouth at bedtime.    Marland Kitchen azithromycin (ZITHROMAX) 250 MG tablet Take 2 tabs PO x 1 dose, then 1 tab PO QD x 4 days (Patient not taking: Reported on 07/30/2014) 6 tablet 0  . sodium chloride (OCEAN) 0.65 % SOLN nasal spray Place 1 spray into both nostrils daily.     No current facility-administered medications on file prior to visit.    Review of Systems:  As per HPI, otherwise negative.    Physical Examination: Filed Vitals:   07/30/14 1637  BP: 114/70  Pulse: 82  Temp: 98.5 F (36.9 C)  Resp: 18   Filed Vitals:   07/30/14 1637  Height: 5\' 6"  (1.676 m)  Weight: 161 lb (73.029 kg)   Body mass index is 26 kg/(m^2). Ideal Body Weight: Weight in (lb) to have BMI = 25: 154.6   GEN: WDWN, NAD, Non-toxic, Alert & Oriented x 3 HEENT: Atraumatic, Normocephalic.  Ears and Nose: No external deformity. EXTR: No clubbing/cyanosis/edema NEURO: Normal  gait.  PSYCH: Normally interactive. Conversant. Not depressed or anxious appearing.  Calm demeanor.  Impetigo   Assessment and Plan: Impetigo bactroban Septra  Signed,  Ellison Carwin, MD

## 2014-08-04 ENCOUNTER — Ambulatory Visit
Admission: RE | Admit: 2014-08-04 | Discharge: 2014-08-04 | Disposition: A | Discharge status: Home / Self Care | Payer: 59 | Source: Ambulatory Visit | Attending: Orthopedic Surgery | Admitting: Orthopedic Surgery

## 2014-08-04 DIAGNOSIS — M25552 Pain in left hip: Secondary | ICD-10-CM

## 2014-09-16 ENCOUNTER — Ambulatory Visit (INDEPENDENT_AMBULATORY_CARE_PROVIDER_SITE_OTHER): Payer: 59 | Admitting: Family Medicine

## 2014-09-16 VITALS — BP 122/78 | HR 67 | Temp 99.0°F | Resp 18 | Ht 65.0 in | Wt 169.0 lb

## 2014-09-16 DIAGNOSIS — H109 Unspecified conjunctivitis: Secondary | ICD-10-CM

## 2014-09-16 DIAGNOSIS — H02843 Edema of right eye, unspecified eyelid: Secondary | ICD-10-CM | POA: Diagnosis not present

## 2014-09-16 DIAGNOSIS — R51 Headache: Secondary | ICD-10-CM | POA: Diagnosis not present

## 2014-09-16 DIAGNOSIS — A499 Bacterial infection, unspecified: Secondary | ICD-10-CM

## 2014-09-16 DIAGNOSIS — H1089 Other conjunctivitis: Secondary | ICD-10-CM

## 2014-09-16 DIAGNOSIS — L539 Erythematous condition, unspecified: Secondary | ICD-10-CM

## 2014-09-16 DIAGNOSIS — R519 Headache, unspecified: Secondary | ICD-10-CM

## 2014-09-16 MED ORDER — OFLOXACIN 0.3 % OP SOLN
1.0000 [drp] | Freq: Four times a day (QID) | OPHTHALMIC | Status: DC
Start: 2014-09-16 — End: 2015-01-22

## 2014-09-16 MED ORDER — DOXYCYCLINE HYCLATE 100 MG PO CAPS: 100.0000 mg | ORAL_CAPSULE | Freq: Two times a day (BID) | ORAL | Status: DC

## 2014-09-16 NOTE — Patient Instructions (Addendum)
Day 1-2: Use 1 eye drop every 2-4 hours. Day 3-7: Use 1 eye drop 4 times daily.    Bacterial Conjunctivitis Bacterial conjunctivitis, commonly called pink eye, is an inflammation of the clear membrane that covers the white part of the eye (conjunctiva). The inflammation can also happen on the underside of the eyelids. The blood vessels in the conjunctiva become inflamed, causing the eye to become red or pink. Bacterial conjunctivitis may spread easily from one eye to another and from person to person (contagious).  CAUSES  Bacterial conjunctivitis is caused by bacteria. The bacteria may come from your own skin, your upper respiratory tract, or from someone else with bacterial conjunctivitis. SYMPTOMS  The normally white color of the eye or the underside of the eyelid is usually pink or red. The pink eye is usually associated with irritation, tearing, and some sensitivity to light. Bacterial conjunctivitis is often associated with a thick, yellowish discharge from the eye. The discharge may turn into a crust on the eyelids overnight, which causes your eyelids to stick together. If a discharge is present, there may also be some blurred vision in the affected eye. DIAGNOSIS  Bacterial conjunctivitis is diagnosed by your caregiver through an eye exam and the symptoms that you report. Your caregiver looks for changes in the surface tissues of your eyes, which may point to the specific type of conjunctivitis. A sample of any discharge may be collected on a cotton-tip swab if you have a severe case of conjunctivitis, if your cornea is affected, or if you keep getting repeat infections that do not respond to treatment. The sample will be sent to a lab to see if the inflammation is caused by a bacterial infection and to see if the infection will respond to antibiotic medicines. TREATMENT   Bacterial conjunctivitis is treated with antibiotics. Antibiotic eyedrops are most often used. However, antibiotic  ointments are also available. Antibiotics pills are sometimes used. Artificial tears or eye washes may ease discomfort. HOME CARE INSTRUCTIONS   To ease discomfort, apply a cool, clean washcloth to your eye for 10-20 minutes, 3-4 times a day.  Gently wipe away any drainage from your eye with a warm, wet washcloth or a cotton ball.  Wash your hands often with soap and water. Use paper towels to dry your hands.  Do not share towels or washcloths. This may spread the infection.  Change or wash your pillowcase every day.  You should not use eye makeup until the infection is gone.  Do not operate machinery or drive if your vision is blurred.  Stop using contact lenses. Ask your caregiver how to sterilize or replace your contacts before using them again. This depends on the type of contact lenses that you use.  When applying medicine to the infected eye, do not touch the edge of your eyelid with the eyedrop bottle or ointment tube. SEEK IMMEDIATE MEDICAL CARE IF:   Your infection has not improved within 3 days after beginning treatment.  You had yellow discharge from your eye and it returns.  You have increased eye pain.  Your eye redness is spreading.  Your vision becomes blurred.  You have a fever or persistent symptoms for more than 2-3 days.  You have a fever and your symptoms suddenly get worse.  You have facial pain, redness, or swelling. MAKE SURE YOU:   Understand these instructions.  Will watch your condition.  Will get help right away if you are not doing well or get worse.  Document Released: 04/05/2005 Document Revised: 08/20/2013 Document Reviewed: 09/06/2011 [facility] Patient Information 2015 [place], [place]. This information is not intended to replace advice given to you by your health care provider. Make sure you discuss any questions you have with your health care provider.

## 2014-09-16 NOTE — Progress Notes (Signed)
MRN: [id number] DOB: 19-Jul-1945  Subjective:   Monica Kane is a 69 y.o. female presenting for chief complaint of Allergic Reaction  Reports 3 day history of swelling around her right lower eyelid, started having red eye last night, itching and pain of her lower eyelid. She has also had some redness just under her eyelids bilaterally, R>L, that redness has also started to spread down the right side of her face and into her neck. Also admits having some shortness of breath during her ballroom dancing class which is not normal for her. Has tried Mucinex, Benadryl without noticing any significant difference. The only inciting factor she can think of is having used a new sunscreen cream. Denies fevers, sinus pain, sinus congestion, ear pain, ear drainage, tooth pain, lip swelling, tongue swelling, numbness or tingling, pain along her neck line, sore throat, cough, wheezing, chest tightness, n/v, abdominal pain. Denies any other aggravating or relieving factors, no other questions or concerns.  Monica Kane has a current medication list which includes the following prescription(s): amitriptyline, carvedilol, celecoxib, cholecalciferol, crestor, diclofenac potassium, lisinopril, nitroglycerin, sertraline, sodium chloride, trazodone, doxycycline, and ofloxacin. She is allergic to penicillins.  Monica Kane  has a past medical history of Hyperlipidemia; Primary localized osteoarthritis of right knee; Hypertension; History of staph infection (11/2013); Neuropathy; Joint pain; Joint swelling; History of colon polyps; History of kidney stones; Depression; Anxiety; and Pneumonia. Also  has past surgical history that includes Abdominal hysterectomy (1989); lateral release right elbow (2010); Shoulder arthroscopy w/ rotator cuff repair (2006); Cervical spine surgery (2004); Parotid gland tumor excision (1976); Diagnostic laparoscopy (1976); Appendectomy; Colonoscopy; Shoulder arthroscopy distal clavicle excision and open  rotator cuff repair (2006); Parotid gland tumor excision (1976); Lateral epicondyle release (03/01/2011); Knee arthroscopy w/ meniscectomy (Right, 01/31/2013); left foot surgery (2013); Cardiac catheterization (2015); Eye surgery (Bilateral); colonscopy; Elbow arthroscopy (2010, 2012); Knee arthroscopy (Oct 2014); Foot surgery (Left, 2013); Total knee arthroplasty (Right, 02/18/2014); and left heart catheterization with coronary angiogram (N/A, 07/23/2013).  ROS As in subjective.  Objective:   Vitals: BP 122/78 mmHg  Pulse 67  Temp(Src) 99 F (37.2 C) (Oral)  Resp 18  Ht 5\' 5"  (1.651 m)  Wt 169 lb (76.658 kg)  BMI 28.12 kg/m2  SpO2 97%  Physical Exam  Constitutional: She is oriented to person, place, and time.  HENT:  TM's intact bilaterally, no effusions or erythema. Nasal turbinates pink and moist. No sinus tenderness. Postnasal drip present, without oropharyngeal exudates, erythema or abscesses.  Eyes: EOM are normal. Pupils are equal, round, and reactive to light. Right eye exhibits discharge (conjunctiva injected, discharge of purulent material with a collection of pus over lower conjunctiva). Left eye exhibits no discharge. No scleral icterus.  Neck: Normal range of motion. Neck supple.  Cardiovascular: Normal rate, regular rhythm and intact distal pulses.  Exam reveals no gallop and no friction rub.   No murmur heard. Pulmonary/Chest: No stridor. No respiratory distress. She has no wheezes. She has no rales.  Lymphadenopathy:    She has no cervical adenopathy.  Neurological: She is alert and oriented to person, place, and time.  Skin: Skin is warm and dry. There is erythema. No pallor.   PROCEDURE NOTE: eye exam Verbal consent obtained. Eyelids everted and swept for foreign body. The eye was stained with fluorescein. Examination under woods lamp does not reveal a foreign body or area of increased stain uptake.   Assessment and Plan :   1. Bacterial conjunctivitis of right  eye 2. Erythema of  face 3. Swelling of eyelid, right 4. Right facial pain - Will start Ocuflox for bacterial conjunctivitis, doxycycline given allergy to penicillin, rtc in 3-4 days if no improvement in symptoms  Monica Eagles, PA-C [facility] [phone] 09/16/2014 2:56 PM

## 2014-10-14 ENCOUNTER — Other Ambulatory Visit: Payer: Self-pay

## 2014-11-06 ENCOUNTER — Other Ambulatory Visit: Payer: Self-pay | Admitting: Orthopedic Surgery

## 2014-11-06 DIAGNOSIS — M545 Low back pain: Secondary | ICD-10-CM

## 2014-11-15 ENCOUNTER — Ambulatory Visit
Admission: RE | Admit: 2014-11-15 | Discharge: 2014-11-15 | Disposition: A | Discharge status: Home / Self Care | Payer: 59 | Source: Ambulatory Visit | Attending: Orthopedic Surgery | Admitting: Orthopedic Surgery

## 2014-11-15 DIAGNOSIS — M545 Low back pain: Secondary | ICD-10-CM

## 2014-12-15 ENCOUNTER — Other Ambulatory Visit: Payer: Self-pay | Admitting: Family Medicine

## 2014-12-26 ENCOUNTER — Other Ambulatory Visit: Payer: Self-pay | Admitting: Family Medicine

## 2015-01-03 ENCOUNTER — Other Ambulatory Visit: Payer: Self-pay | Admitting: Family Medicine

## 2015-01-21 ENCOUNTER — Encounter: Payer: Self-pay | Admitting: Emergency Medicine

## 2015-01-21 ENCOUNTER — Other Ambulatory Visit: Payer: Self-pay | Admitting: Family Medicine

## 2015-01-22 ENCOUNTER — Encounter: Payer: Self-pay | Admitting: Family Medicine

## 2015-01-22 ENCOUNTER — Ambulatory Visit (INDEPENDENT_AMBULATORY_CARE_PROVIDER_SITE_OTHER): Payer: 59 | Admitting: Family Medicine

## 2015-01-22 VITALS — BP 132/80 | HR 69 | Temp 98.6°F | Resp 16 | Ht 65.5 in | Wt 161.8 lb

## 2015-01-22 DIAGNOSIS — Z1329 Encounter for screening for other suspected endocrine disorder: Secondary | ICD-10-CM

## 2015-01-22 DIAGNOSIS — Z130 Encounter for screening for diseases of the blood and blood-forming organs and certain disorders involving the immune mechanism: Secondary | ICD-10-CM

## 2015-01-22 DIAGNOSIS — G609 Hereditary and idiopathic neuropathy, unspecified: Secondary | ICD-10-CM | POA: Diagnosis not present

## 2015-01-22 DIAGNOSIS — G4700 Insomnia, unspecified: Secondary | ICD-10-CM | POA: Diagnosis not present

## 2015-01-22 DIAGNOSIS — M8580 Other specified disorders of bone density and structure, unspecified site: Secondary | ICD-10-CM

## 2015-01-22 DIAGNOSIS — Z23 Encounter for immunization: Secondary | ICD-10-CM | POA: Diagnosis not present

## 2015-01-22 DIAGNOSIS — E785 Hyperlipidemia, unspecified: Secondary | ICD-10-CM

## 2015-01-22 DIAGNOSIS — N952 Postmenopausal atrophic vaginitis: Secondary | ICD-10-CM

## 2015-01-22 DIAGNOSIS — Z131 Encounter for screening for diabetes mellitus: Secondary | ICD-10-CM

## 2015-01-22 DIAGNOSIS — Z0000 Encounter for general adult medical examination without abnormal findings: Secondary | ICD-10-CM | POA: Diagnosis not present

## 2015-01-22 DIAGNOSIS — F321 Major depressive disorder, single episode, moderate: Secondary | ICD-10-CM

## 2015-01-22 DIAGNOSIS — Z780 Asymptomatic menopausal state: Secondary | ICD-10-CM

## 2015-01-22 DIAGNOSIS — I10 Essential (primary) hypertension: Secondary | ICD-10-CM | POA: Diagnosis not present

## 2015-01-22 LAB — LIPID PANEL
CHOL/HDL RATIO: 2.4 ratio (ref <=5.0)
Cholesterol: 180 mg/dL (ref 125–200)
HDL: 75 mg/dL (ref >=46)
LDL CALC: 87 mg/dL (ref <130)
Triglycerides: 90 mg/dL (ref <150)
VLDL: 18 mg/dL (ref <30)

## 2015-01-22 LAB — CBC
HEMATOCRIT: 43.3 % (ref 36.0–46.0)
Hemoglobin: 14.2 g/dL (ref 12.0–15.0)
MCH: 26.2 pg (ref 26.0–34.0)
MCHC: 32.8 g/dL (ref 30.0–36.0)
MCV: 79.7 fL (ref 78.0–100.0)
MPV: 10.1 fL (ref 8.6–12.4)
Platelets: 222 10*3/uL (ref 150–400)
RBC: 5.43 MIL/uL — ABNORMAL HIGH (ref 3.87–5.11)
RDW: 13.5 % (ref 11.5–15.5)
WBC: 6.6 10*3/uL (ref 4.0–10.5)

## 2015-01-22 LAB — COMPREHENSIVE METABOLIC PANEL
ALT: 26 U/L (ref 6–29)
AST: 18 U/L (ref 10–35)
Albumin: 4.3 g/dL (ref 3.6–5.1)
Alkaline Phosphatase: 82 U/L (ref 33–130)
BUN: 20 mg/dL (ref 7–25)
CALCIUM: 9.2 mg/dL (ref 8.6–10.4)
CHLORIDE: 105 mmol/L (ref 98–110)
CO2: 27 mmol/L (ref 20–31)
Creat: 0.88 mg/dL (ref 0.50–0.99)
GLUCOSE: 112 mg/dL — AB (ref 65–99)
POTASSIUM: 4.2 mmol/L (ref 3.5–5.3)
Sodium: 139 mmol/L (ref 135–146)
Total Bilirubin: 0.5 mg/dL (ref 0.2–1.2)
Total Protein: 6.3 g/dL (ref 6.1–8.1)

## 2015-01-22 LAB — TSH: TSH: 2.368 u[IU]/mL (ref 0.350–4.500)

## 2015-01-22 MED ORDER — ROSUVASTATIN CALCIUM 10 MG PO TABS: ORAL_TABLET | ORAL | Status: DC

## 2015-01-22 MED ORDER — LISINOPRIL 10 MG PO TABS: 10.0000 mg | ORAL_TABLET | Freq: Every day | ORAL | Status: DC

## 2015-01-22 MED ORDER — TRAZODONE HCL 50 MG PO TABS: 50.0000 mg | ORAL_TABLET | Freq: Every day | ORAL | Status: DC

## 2015-01-22 MED ORDER — AMITRIPTYLINE HCL 25 MG PO TABS: 25.0000 mg | ORAL_TABLET | Freq: Every day | ORAL | Status: DC

## 2015-01-22 MED ORDER — CARVEDILOL 3.125 MG PO TABS: ORAL_TABLET | ORAL | Status: DC

## 2015-01-22 MED ORDER — ESTRADIOL 10 MCG VA TABS: ORAL_TABLET | VAGINAL | Status: DC

## 2015-01-22 MED ORDER — SERTRALINE HCL 100 MG PO TABS: 250.0000 mg | ORAL_TABLET | Freq: Every day | ORAL | Status: DC

## 2015-01-22 NOTE — Progress Notes (Addendum)
[facility] [address], [place]  [address] [phone]  Date:  01/22/2015   Name:  Monica Kane   DOB:  02-05-1946   MRN:  [id number]  PCP:  Jenny Reichmann, MD    Chief Complaint: Annual Exam   History of Present Illness:  Monica Kane is a 69 y.o. very pleasant female patient who presents with the following:  Here today for a CPE S/p total hysterectomy in 1989 - this was done due to fibroids and endometriosis- no cancer  She had pneumovax in 2014- no prevnar yet Tetanus is UTD Colon 2015 mammo 2016  She is fasting this am  She is having some spine and hip injections per MW- this does seem to be helping her.  She did have an MRI - it showed a bulging disc in L5/S1.    She is not really SA due to painful sex- this has been a problem for years  She tried premarin cream in the past but it did seem to help- however this was several years ago, she would be willing to try this again She did have chest pain and underwent a cardiac cath last year- however it was negative She does not have any known history of CAD, no history of stroke    She takes a low dose of elavil qhs for nerve pain in her foot  She is on 250 of zoloft and has been for years.  She is aware that this is higher than typical dose.  However she notes that it works very well for her and really helps prevent depression sx  Trazodone at bedtime for sleep  She did have a dexa in 04/2012- low bone mass  BP Readings from Last 3 Encounters:  01/22/15 132/80  09/16/14 122/78  07/30/14 114/70   Wt Readings from Last 3 Encounters:  01/22/15 161 lb 12.8 oz (73.392 kg)  09/16/14 169 lb (76.658 kg)  08/04/14 161 lb (73.029 kg)     Patient Active Problem List   Diagnosis Date Noted  . DJD (degenerative joint disease) of knee 02/18/2014  . Cataract extraction status of left eye 01/16/2014  . Primary localized osteoarthritis of right knee   . Anxiety   . Chest pain, midsternal  07/23/2013  . Osteopenia 03/23/2013  . Kidney stones, calcium oxalate 03/23/2013  . Hypertension 03/31/2011  . Hyperlipidemia 03/31/2011  . Vitamin D deficiency 03/31/2011  . Hyperglycemia 03/31/2011  . Foot pain, left 03/24/2011  . Hallux rigidus 03/24/2011  . Metatarsalgia of left foot 03/24/2011  . Lateral epicondylitis of left elbow 02/24/2011  . Migraine 02/24/2011  . Depression 02/24/2011  . Cataract extraction status of right eye 02/24/2011  . Arthritis 02/24/2011    Past Medical History  Diagnosis Date  . Hyperlipidemia     takes Crestor nightly   . Primary localized osteoarthritis of right knee   . Hypertension     takes Lisinopril and Coreg daily  . History of staph infection 11/2013  . Neuropathy (HCC)     takes Elavil nightly  . Joint pain   . Joint swelling   . History of colon polyps   . History of kidney stones   . Depression     takes Zoloft daily  . Anxiety     takes Ativan nightly  . Pneumonia     Past Surgical History  Procedure Laterality Date  . Abdominal hysterectomy  1989  . Lateral release right elbow  2010  .  Shoulder arthroscopy w/ rotator cuff repair  2006  . Cervical spine surgery  2004    C 5-6  . Parotid gland tumor excision  1976    benign  . Diagnostic laparoscopy  1976    fibroid removed  . Appendectomy    . Colonoscopy    . Shoulder arthroscopy distal clavicle excision and open rotator cuff repair  2006  . Parotid gland tumor excision  1976    rt  . Lateral epicondyle release  03/01/2011    Procedure: TENNIS ELBOW RELEASE;  Surgeon: Lorn Junes, MD;  Location: [facility];  Service: Orthopedics;  Laterality: Left;  TENOTOMY ELBOW LATERAL EPICONDYLITIS TENNIS ELBOW  . Knee arthroscopy w/ meniscectomy Right 01/31/2013    [facility]  . Left foot surgery  2013    mortons neuroma  . Cardiac catheterization  2015  . Eye surgery Bilateral     cataract  . Colonscopy    . Elbow arthroscopy  2010, 2012  . Knee  arthroscopy  Oct 2014  . Foot surgery Left 2013  . Total knee arthroplasty Right 02/18/2014    Procedure: TOTAL KNEE ARTHROPLASTY;  Surgeon: Lorn Junes, MD;  Location: [facility];  Service: Orthopedics;  Laterality: Right;  . Left heart catheterization with coronary angiogram N/A 07/23/2013    Procedure: LEFT HEART CATHETERIZATION WITH CORONARY ANGIOGRAM;  Surgeon: Sinclair Grooms, MD;  Location: [facility] CATH LAB;  Service: Cardiovascular;  Laterality: N/A;    Social History  Substance Use Topics  . Smoking status: Never Smoker   . Smokeless tobacco: Never Used  . Alcohol Use: Yes     Comment: 1-2 drinks weekly    Family History  Problem Relation Age of Onset  . Cancer Mother     lung  . Cancer Father     lung; + tobacco  . Heart disease Maternal Grandfather     heart attack  . Heart disease Paternal Grandfather     heart attack  . Diabetes Sister   . Hypertension Sister   . Hyperlipidemia Sister     Allergies  Allergen Reactions  . Penicillins Swelling and Rash    Medication list has been reviewed and updated.  Current Outpatient Prescriptions on File Prior to Visit  Medication Sig Dispense Refill  . amitriptyline (ELAVIL) 25 MG tablet Take 1 tablet (25 mg total) by mouth at bedtime. PATIENT NEEDS OFFICE VISIT FOR ADDITIONAL REFILLS 31 tablet 0  . carvedilol (COREG) 3.125 MG tablet TAKE 1 TABLET TWICE DAILY WITH FOOD. 60 tablet 5  . lisinopril (PRINIVIL,ZESTRIL) 10 MG tablet TAKE 1 TABLET DAILY. 30 tablet 0  . nitroGLYCERIN (NITROSTAT) 0.4 MG SL tablet Place 1 tablet (0.4 mg total) under the tongue every 5 (five) minutes as needed for chest pain. 14 tablet 0  . rosuvastatin (CRESTOR) 10 MG tablet TAKE 1 TABLET DAILY 30 tablet 1  . sertraline (ZOLOFT) 100 MG tablet Take 250 mg by mouth daily.     . traZODone (DESYREL) 50 MG tablet Take 50-100 mg by mouth at bedtime.    . celecoxib (CELEBREX) 200 MG capsule Take 1 capsule (200 mg total) by mouth every 12 (twelve) hours.  (Patient not taking: Reported on 01/22/2015) 30 capsule 3  . cholecalciferol (VITAMIN D) 1000 UNITS tablet Take 1,000 Units by mouth 2 (two) times daily.     Marland Kitchen ofloxacin (OCUFLOX) 0.3 % ophthalmic solution Place 1 drop into the right eye 4 (four) times daily. (Patient not taking: Reported on  01/22/2015) 5 mL 0  . sodium chloride (OCEAN) 0.65 % SOLN nasal spray Place 1 spray into both nostrils daily.     No current facility-administered medications on file prior to visit.    Review of Systems:  As per HPI- otherwise negative.   Physical Examination: Filed Vitals:   01/22/15 0825  BP: 132/80  Pulse: 69  Temp: 98.6 F (37 C)  Resp: 16   Filed Vitals:   01/22/15 0825  Height: 5' 5.5" (1.664 m)  Weight: 161 lb 12.8 oz (73.392 kg)   Body mass index is 26.51 kg/(m^2). Ideal Body Weight: Weight in (lb) to have BMI = 25: 152.2  GEN: WDWN, NAD, Non-toxic, A & O x 3, mild overweight, looks well HEENT: Atraumatic, Normocephalic. Neck supple. No masses, No LAD.  Bilateral TM wnl, oropharynx normal.  PEERL,EOMI.   Ears and Nose: No external deformity. CV: RRR, No M/G/R. No JVD. No thrill. No extra heart sounds. PULM: CTA B, no wheezes, crackles, rhonchi. No retractions. No resp. distress. No accessory muscle use. ABD: S, NT, ND, +BS. No rebound. No HSM. EXTR: No c/c/e NEURO Normal gait.  PSYCH: Normally interactive. Conversant. Not depressed or anxious appearing.  Calm demeanor.  Breast: normal exam, no masses/ dimpling/ discharge.  Scar from bx x2- benign per her report Pelvic: normal external exam, vaginal atrophy    Assessment and Plan: Physical exam  Need for prophylactic vaccination and inoculation against influenza - Plan: Flu Vaccine QUAD 36+ mos IM  Screening for deficiency anemia - Plan: CBC  Dyslipidemia - Plan: Lipid panel, rosuvastatin (CRESTOR) 10 MG tablet  Screening for diabetes mellitus - Plan: Comprehensive metabolic panel  Screening for thyroid disorder - Plan:  TSH  Immunization due - Plan: Pneumococcal conjugate vaccine 13-valent IM  Vaginal atrophy - Plan: Estradiol 10 MCG TABS vaginal tablet  Essential hypertension - Plan: carvedilol (COREG) 3.125 MG tablet, lisinopril (PRINIVIL,ZESTRIL) 10 MG tablet  Moderate single current episode of major depressive disorder ([place]) - Plan: sertraline (ZOLOFT) 100 MG tablet  Insomnia - Plan: traZODone (DESYREL) 50 MG tablet  Hereditary and idiopathic peripheral neuropathy - Plan: amitriptyline (ELAVIL) 25 MG tablet  Low bone mass - Plan: DG Bone Density  Post-menopausal - Plan: DG Bone Density  Complete physical, labs and screening as above today We will try vaginal estradiol for vaginal atrophy and pain  Signed Lamar Blinks, MD

## 2015-01-22 NOTE — Patient Instructions (Signed)
Great to see you today- I will be in touch with your labs asap Use the estrogen tablet as directed- I hope that it will help you have more comfort during sex. However if this does not help there is more that can be done for you- let me know if you need any further assistance.  It is true that having intercourse will generally make it more comfortable "with practice"!

## 2015-01-23 NOTE — Addendum Note (Signed)
Addended by: Lamar Blinks C on: 01/23/2015 07:07 PM   Modules accepted: Orders

## 2015-01-28 ENCOUNTER — Other Ambulatory Visit: Payer: Self-pay

## 2015-01-28 DIAGNOSIS — E2839 Other primary ovarian failure: Secondary | ICD-10-CM

## 2015-02-25 ENCOUNTER — Encounter: Payer: Self-pay | Admitting: Family Medicine

## 2015-02-25 DIAGNOSIS — E785 Hyperlipidemia, unspecified: Secondary | ICD-10-CM

## 2015-02-25 MED ORDER — ROSUVASTATIN CALCIUM 10 MG PO TABS: 10.0000 mg | ORAL_TABLET | Freq: Every day | ORAL | Status: DC

## 2015-03-31 ENCOUNTER — Ambulatory Visit
Admission: RE | Admit: 2015-03-31 | Discharge: 2015-03-31 | Disposition: A | Discharge status: Home / Self Care | Payer: 59 | Source: Ambulatory Visit | Attending: Family Medicine | Admitting: Family Medicine

## 2015-03-31 DIAGNOSIS — E2839 Other primary ovarian failure: Secondary | ICD-10-CM

## 2015-04-07 ENCOUNTER — Encounter: Payer: Self-pay | Admitting: Family Medicine

## 2015-05-24 ENCOUNTER — Encounter: Payer: Self-pay | Admitting: Family Medicine

## 2015-05-27 ENCOUNTER — Telehealth: Payer: Self-pay | Admitting: Family Medicine

## 2015-05-27 ENCOUNTER — Other Ambulatory Visit: Payer: Self-pay

## 2015-05-27 DIAGNOSIS — Z1231 Encounter for screening mammogram for malignant neoplasm of breast: Secondary | ICD-10-CM

## 2015-05-27 NOTE — Telephone Encounter (Signed)
...   Concerns about rx regimen recommended

## 2015-05-27 NOTE — Telephone Encounter (Signed)
Called her and LMOM- went over my previous recommendation that she consider medication as her 10 year fracture risk is 25%. Left her some days that I am available in the walk-in

## 2015-05-27 NOTE — Telephone Encounter (Signed)
Pt is requesting ph. Consultation re. DEXA scan results.  [phone] VM okay

## 2015-05-28 DIAGNOSIS — L821 Other seborrheic keratosis: Secondary | ICD-10-CM | POA: Diagnosis not present

## 2015-05-28 DIAGNOSIS — L719 Rosacea, unspecified: Secondary | ICD-10-CM | POA: Diagnosis not present

## 2015-05-28 DIAGNOSIS — L814 Other melanin hyperpigmentation: Secondary | ICD-10-CM | POA: Diagnosis not present

## 2015-05-28 DIAGNOSIS — L820 Inflamed seborrheic keratosis: Secondary | ICD-10-CM | POA: Diagnosis not present

## 2015-05-28 NOTE — Telephone Encounter (Signed)
Noted  

## 2015-06-03 ENCOUNTER — Ambulatory Visit (INDEPENDENT_AMBULATORY_CARE_PROVIDER_SITE_OTHER): Payer: Medicare Other | Admitting: Family Medicine

## 2015-06-03 VITALS — BP 144/86 | HR 62 | Temp 98.2°F | Resp 16 | Ht 65.2 in | Wt 171.4 lb

## 2015-06-03 DIAGNOSIS — E559 Vitamin D deficiency, unspecified: Secondary | ICD-10-CM | POA: Diagnosis not present

## 2015-06-03 DIAGNOSIS — I10 Essential (primary) hypertension: Secondary | ICD-10-CM

## 2015-06-03 DIAGNOSIS — R7309 Other abnormal glucose: Secondary | ICD-10-CM

## 2015-06-03 DIAGNOSIS — M8580 Other specified disorders of bone density and structure, unspecified site: Secondary | ICD-10-CM

## 2015-06-03 LAB — HEMOGLOBIN A1C
Hgb A1c MFr Bld: 6.2 % — ABNORMAL HIGH (ref <5.7)
Mean Plasma Glucose: 131 mg/dL — ABNORMAL HIGH (ref <117)

## 2015-06-03 MED ORDER — LISINOPRIL 20 MG PO TABS: ORAL_TABLET | ORAL | Status: DC

## 2015-06-03 MED ORDER — IBANDRONATE SODIUM 150 MG PO TABS: ORAL_TABLET | ORAL | Status: DC

## 2015-06-03 NOTE — Progress Notes (Signed)
[facility] [address] [place] [address] [phone]  Date:  06/03/2015   Name:  Monica Kane   DOB:  [birth year]   MRN:  [id number]  PCP:  Monica Reichmann, MD    Chief Complaint: Follow-up   History of Present Illness:  Monica Kane is a 70 y.o. very pleasant female patient who presents with the following:  Here today to discus her bone density test results- see Dexa scans from 2016 and from 2014. Her 10 year risk of a major osteoporotic fracture is now approx 26%, hip just under 3%  She had been on vitamin D in the past- she stopped this a year or so ago around the time of knee surgery.  She had been deficient in the past she thinks but was just taking 1,000 iu daily  Also, her BP has been fluctuating some recently- she notes that it tends to run 150 or higher/ 70-80 on a consistent basis at home.  It nearly never lower than that.  She is taking coreg and lisinopirl 10 mg. No SE  She has never been on any medication for bone density but is interested in taking something to help reduce her risk of fracture She had a complete hysterecomy in approx 1988- her ovaries were removed at that time   She was noted to have borderline glucose recently and we would like to do an A1c for her today  BP Readings from Last 3 Encounters:  06/03/15 144/86  01/22/15 132/80  09/16/14 122/78     Patient Active Problem List   Diagnosis Date Noted  . DJD (degenerative joint disease) of knee 02/18/2014  . Cataract extraction status of left eye 01/16/2014  . Primary localized osteoarthritis of right knee   . Anxiety   . Chest pain, midsternal 07/23/2013  . Osteopenia 03/23/2013  . Kidney stones, calcium oxalate 03/23/2013  . Hypertension 03/31/2011  . Hyperlipidemia 03/31/2011  . Vitamin D deficiency 03/31/2011  . Hyperglycemia 03/31/2011  . Foot pain, left 03/24/2011  . Hallux rigidus 03/24/2011  . Metatarsalgia of left foot 03/24/2011  . Lateral  epicondylitis of left elbow 02/24/2011  . Migraine 02/24/2011  . Depression 02/24/2011  . Cataract extraction status of right eye 02/24/2011  . Arthritis 02/24/2011    Past Medical History  Diagnosis Date  . Hyperlipidemia     takes Crestor nightly   . Primary localized osteoarthritis of right knee   . Hypertension     takes Lisinopril and Coreg daily  . History of staph infection 11/2013  . Neuropathy (HCC)     takes Elavil nightly  . Joint pain   . Joint swelling   . History of colon polyps   . History of kidney stones   . Depression     takes Zoloft daily  . Anxiety     takes Ativan nightly  . Pneumonia     Past Surgical History  Procedure Laterality Date  . Abdominal hysterectomy  1989  . Lateral release right elbow  2010  . Shoulder arthroscopy w/ rotator cuff repair  2006  . Cervical spine surgery  2004    C 5-6  . Parotid gland tumor excision  1976    benign  . Diagnostic laparoscopy  1976    fibroid removed  . Appendectomy    . Colonoscopy    . Shoulder arthroscopy distal clavicle excision and open rotator cuff repair  2006  . Parotid gland tumor excision  1976    rt  . Lateral epicondyle release  03/01/2011    Procedure: TENNIS ELBOW RELEASE;  Surgeon: Lorn Junes, MD;  Location: [place];  Service: Orthopedics;  Laterality: Left;  TENOTOMY ELBOW LATERAL EPICONDYLITIS TENNIS ELBOW  . Knee arthroscopy w/ meniscectomy Right 01/31/2013    [facility]  . Left foot surgery  2013    mortons neuroma  . Cardiac catheterization  2015  . Eye surgery Bilateral     cataract  . Colonscopy    . Elbow arthroscopy  2010, 2012  . Knee arthroscopy  Oct 2014  . Foot surgery Left 2013  . Total knee arthroplasty Right 02/18/2014    Procedure: TOTAL KNEE ARTHROPLASTY;  Surgeon: Lorn Junes, MD;  Location: [place];  Service: Orthopedics;  Laterality: Right;  . Left heart catheterization with coronary angiogram N/A 07/23/2013    Procedure: LEFT HEART  CATHETERIZATION WITH CORONARY ANGIOGRAM;  Surgeon: Sinclair Grooms, MD;  Location: [facility] CATH LAB;  Service: Cardiovascular;  Laterality: N/A;    Social History  Substance Use Topics  . Smoking status: Never Smoker   . Smokeless tobacco: Never Used  . Alcohol Use: Yes     Comment: 1-2 drinks weekly    Family History  Problem Relation Age of Onset  . Cancer Mother     lung  . Cancer Father     lung; + tobacco  . Heart disease Maternal Grandfather     heart attack  . Heart disease Paternal Grandfather     heart attack  . Diabetes Sister   . Hypertension Sister   . Hyperlipidemia Sister     Allergies  Allergen Reactions  . Penicillins Swelling and Rash    Medication list has been reviewed and updated.  Current Outpatient Prescriptions on File Prior to Visit  Medication Sig Dispense Refill  . amitriptyline (ELAVIL) 25 MG tablet Take 1 tablet (25 mg total) by mouth at bedtime. 90 tablet 3  . carvedilol (COREG) 3.125 MG tablet TAKE 1 TABLET TWICE DAILY WITH FOOD. 180 tablet 3  . lisinopril (PRINIVIL,ZESTRIL) 10 MG tablet Take 1 tablet (10 mg total) by mouth daily. 90 tablet 3  . nitroGLYCERIN (NITROSTAT) 0.4 MG SL tablet Place 1 tablet (0.4 mg total) under the tongue every 5 (five) minutes as needed for chest pain. 14 tablet 0  . rosuvastatin (CRESTOR) 10 MG tablet Take 1 tablet (10 mg total) by mouth daily. 90 tablet 3  . sertraline (ZOLOFT) 100 MG tablet Take 2.5 tablets (250 mg total) by mouth daily. 225 tablet 3  . traZODone (DESYREL) 50 MG tablet Take 1-2 tablets (50-100 mg total) by mouth at bedtime. 180 tablet 3  . celecoxib (CELEBREX) 200 MG capsule Take 1 capsule (200 mg total) by mouth every 12 (twelve) hours. (Patient not taking: Reported on 01/22/2015) 30 capsule 3  . cholecalciferol (VITAMIN D) 1000 UNITS tablet Take 1,000 Units by mouth 2 (two) times daily. Reported on 06/03/2015    . Estradiol 10 MCG TABS vaginal tablet Use one PV daily for 2 weeks- then use twice a  week as needed (Patient not taking: Reported on 06/03/2015) 30 tablet 3  . sodium chloride (OCEAN) 0.65 % SOLN nasal spray Place 1 spray into both nostrils daily. Reported on 06/03/2015     No current facility-administered medications on file prior to visit.    Review of Systems:  As per HPI- otherwise negative.   Physical Examination: Filed Vitals:   06/03/15 1035  06/03/15 1144  BP: 168/88 144/86  Pulse: 62   Temp: 98.2 F (36.8 C)   Resp: 16    Filed Vitals:   06/03/15 1035  Height: 5' 5.2" (1.656 m)  Weight: 171 lb 6.4 oz (77.747 kg)   Body mass index is 28.35 kg/(m^2). Ideal Body Weight: Weight in (lb) to have BMI = 25: 150.8  GEN: WDWN, NAD, Non-toxic, A & O x 3, looks well, mild overweight HEENT: Atraumatic, Normocephalic. Neck supple. No masses, No LAD. Ears and Nose: No external deformity. CV: RRR, No M/G/R. No JVD. No thrill. No extra heart sounds. PULM: CTA B, no wheezes, crackles, rhonchi. No retractions. No resp. distress. No accessory muscle use. EXTR: No c/c/e NEURO Normal gait.  PSYCH: Normally interactive. Conversant. Not depressed or anxious appearing.  Calm demeanor.    Assessment and Plan: Osteopenia - Plan: Vitamin D, 25-hydroxy, ibandronate (BONIVA) 150 MG tablet  Essential hypertension - Plan: lisinopril (PRINIVIL,ZESTRIL) 20 MG tablet  Elevated glucose - Plan: Hemoglobin A1c  Vitamin D deficiency - Plan: Vitamin D, 25-hydroxy  Start on boniva for osteopenia, plan repeat dexa in 2-3 years  Increase her lisinopril to 20 mg, continue coreg.  She will let me know if her BP continues to run high or if it starts running too low Check vitamin D  Signed Lamar Blinks, MD

## 2015-06-03 NOTE — Patient Instructions (Addendum)
We are going to increase your lisinopril to 20 mg a day- let me know if your BP continues to run high Your fracture risk has gone up since 2014- we are going to start you on boniva to help rebuild your bone mass.  I will also check your vitamin D level and will be in touch with this and your other labs We will also check a hemoglobin A1c to check for pre-diabetes.    Let's plan to repeat your bone density test in 2-3 years.    It would be my pleasure to continue to see you as a patient at my new office, starting the last week of February.  If you prefer to remain at [facility] one of my partners will be happy to see you here.   [facility] at [facility] [address] [place], [place] [address] Phone: [phone]

## 2015-06-04 LAB — VITAMIN D 25 HYDROXY (VIT D DEFICIENCY, FRACTURES): VIT D 25 HYDROXY: 16 ng/mL — AB (ref 30–100)

## 2015-06-05 ENCOUNTER — Other Ambulatory Visit: Payer: Self-pay | Admitting: Family Medicine

## 2015-06-05 DIAGNOSIS — E119 Type 2 diabetes mellitus without complications: Secondary | ICD-10-CM | POA: Insufficient documentation

## 2015-06-05 DIAGNOSIS — R7303 Prediabetes: Secondary | ICD-10-CM

## 2015-06-05 DIAGNOSIS — E559 Vitamin D deficiency, unspecified: Secondary | ICD-10-CM

## 2015-06-05 MED ORDER — VITAMIN D (ERGOCALCIFEROL) 1.25 MG (50000 UNIT) PO CAPS: 50000.0000 [IU] | ORAL_CAPSULE | ORAL | Status: DC

## 2015-06-09 ENCOUNTER — Encounter: Payer: Self-pay | Admitting: Family Medicine

## 2015-06-10 NOTE — Telephone Encounter (Signed)
Called pt an left message but did not reach her

## 2015-06-11 ENCOUNTER — Ambulatory Visit (INDEPENDENT_AMBULATORY_CARE_PROVIDER_SITE_OTHER): Payer: Medicare Other | Admitting: Family Medicine

## 2015-06-11 ENCOUNTER — Encounter: Payer: Self-pay | Admitting: Family Medicine

## 2015-06-11 VITALS — BP 156/69 | HR 69 | Temp 98.2°F | Ht 65.0 in | Wt 171.2 lb

## 2015-06-11 DIAGNOSIS — N390 Urinary tract infection, site not specified: Secondary | ICD-10-CM | POA: Diagnosis not present

## 2015-06-11 DIAGNOSIS — R220 Localized swelling, mass and lump, head: Secondary | ICD-10-CM | POA: Diagnosis not present

## 2015-06-11 LAB — POCT URINALYSIS DIPSTICK
BILIRUBIN UA: NEGATIVE
Blood, UA: NEGATIVE
Glucose, UA: NEGATIVE
KETONES UA: NEGATIVE
Nitrite, UA: NEGATIVE
PH UA: 5.5
Urobilinogen, UA: NEGATIVE

## 2015-06-11 MED ORDER — NITROFURANTOIN MONOHYD MACRO 100 MG PO CAPS: 100.0000 mg | ORAL_CAPSULE | Freq: Two times a day (BID) | ORAL | Status: DC

## 2015-06-11 NOTE — Progress Notes (Signed)
[place] [facility] [address] [place], [place] [address] [phone]  Date:  06/11/2015   Name:  Monica Kane   DOB:  02/08/1946   MRN:  [id number]  PCP:  Lamar Blinks, MD    Chief Complaint: Medication Management   History of Present Illness:  Monica Kane is a 70 y.o. very pleasant female patient who presents with the following:  Today is [place] I saw this pt about one week ago- at that time we decided to start her on boniva for osteopenia. She took her dose of Boniva this last Wednesday (one week ago today). She was fine, but then on Sunday she noted onset of her face looking "puffy."  Also she was having a hard time swallowing for a few hours. Apparently her friend ho was with her at lunch thought that she looked swollen but the pt herself did not really notice anything wen she looked in the mirror. She did not have any SOB, wheezing, lip or tongue swelling  She did not have any swallowing pain but felt like food was getting stuck when she tried to swallow for a few hours. Never had any hives, but she did have some itching  Sx are not present now Never had this in the past  She is also wondering about a UTI today.  She notes a tingling feeling and urinary odor that are typical of her UTI sx. These have been present for 2 days No fever, abd pain, etc  Patient Active Problem List   Diagnosis Date Noted  . Pre-diabetes 06/05/2015  . DJD (degenerative joint disease) of knee 02/18/2014  . Cataract extraction status of left eye 01/16/2014  . Primary localized osteoarthritis of right knee   . Anxiety   . Chest pain, midsternal 07/23/2013  . Osteopenia 03/23/2013  . Kidney stones, calcium oxalate 03/23/2013  . Hypertension 03/31/2011  . Hyperlipidemia 03/31/2011  . Vitamin D deficiency 03/31/2011  . Hyperglycemia 03/31/2011  . Foot pain, left 03/24/2011  . Hallux rigidus 03/24/2011  . Metatarsalgia of left  foot 03/24/2011  . Lateral epicondylitis of left elbow 02/24/2011  . Migraine 02/24/2011  . Depression 02/24/2011  . Cataract extraction status of right eye 02/24/2011  . Arthritis 02/24/2011    Past Medical History  Diagnosis Date  . Hyperlipidemia     takes Crestor nightly   . Primary localized osteoarthritis of right knee   . Hypertension     takes Lisinopril and Coreg daily  . History of staph infection 11/2013  . Neuropathy (HCC)     takes Elavil nightly  . Joint pain   . Joint swelling   . History of colon polyps   . History of kidney stones   . Depression     takes Zoloft daily  . Anxiety     takes Ativan nightly  . Pneumonia     Past Surgical History  Procedure Laterality Date  . Abdominal hysterectomy  1989  . Lateral release right elbow  2010  . Shoulder arthroscopy w/ rotator cuff repair  2006  . Cervical spine surgery  2004    C 5-6  . Parotid gland tumor excision  1976    benign  . Diagnostic laparoscopy  1976    fibroid removed  . Appendectomy    . Colonoscopy    . Shoulder arthroscopy distal clavicle excision and open rotator cuff repair  2006  . Parotid gland tumor excision  1976    rt  . Lateral epicondyle release  03/01/2011    Procedure: TENNIS ELBOW RELEASE;  Surgeon: Lorn Junes, MD;  Location: [place];  Service: Orthopedics;  Laterality: Left;  TENOTOMY ELBOW LATERAL EPICONDYLITIS TENNIS ELBOW  . Knee arthroscopy w/ meniscectomy Right 01/31/2013    [facility]  . Left foot surgery  2013    mortons neuroma  . Cardiac catheterization  2015  . Eye surgery Bilateral     cataract  . Colonscopy    . Elbow arthroscopy  2010, 2012  . Knee arthroscopy  Oct 2014  . Foot surgery Left 2013  . Total knee arthroplasty Right 02/18/2014    Procedure: TOTAL KNEE ARTHROPLASTY;  Surgeon: Lorn Junes, MD;  Location: [place];  Service: Orthopedics;  Laterality: Right;  . Left heart catheterization with coronary angiogram N/A 07/23/2013     Procedure: LEFT HEART CATHETERIZATION WITH CORONARY ANGIOGRAM;  Surgeon: Sinclair Grooms, MD;  Location: [facility] CATH LAB;  Service: Cardiovascular;  Laterality: N/A;    Social History  Substance Use Topics  . Smoking status: Never Smoker   . Smokeless tobacco: Never Used  . Alcohol Use: Yes     Comment: 1-2 drinks weekly    Family History  Problem Relation Age of Onset  . Cancer Mother     lung  . Cancer Father     lung; + tobacco  . Heart disease Maternal Grandfather     heart attack  . Heart disease Paternal Grandfather     heart attack  . Diabetes Sister   . Hypertension Sister   . Hyperlipidemia Sister     Allergies  Allergen Reactions  . Penicillins Swelling and Rash    Medication list has been reviewed and updated.  Current Outpatient Prescriptions on File Prior to Visit  Medication Sig Dispense Refill  . amitriptyline (ELAVIL) 25 MG tablet Take 1 tablet (25 mg total) by mouth at bedtime. 90 tablet 3  . carvedilol (COREG) 3.125 MG tablet TAKE 1 TABLET TWICE DAILY WITH FOOD. 180 tablet 3  . Estradiol 10 MCG TABS vaginal tablet Use one PV daily for 2 weeks- then use twice a week as needed (Patient not taking: Reported on 06/03/2015) 30 tablet 3  . ibandronate (BONIVA) 150 MG tablet Take 1 monthly in the morning with a glass of water, empty stomach, nothing anything else by mouth or lie down for the next 30 min. 3 tablet 4  . lisinopril (PRINIVIL,ZESTRIL) 20 MG tablet Take 1 daily 90 tablet 3  . nitroGLYCERIN (NITROSTAT) 0.4 MG SL tablet Place 1 tablet (0.4 mg total) under the tongue every 5 (five) minutes as needed for chest pain. 14 tablet 0  . rosuvastatin (CRESTOR) 10 MG tablet Take 1 tablet (10 mg total) by mouth daily. 90 tablet 3  . sertraline (ZOLOFT) 100 MG tablet Take 2.5 tablets (250 mg total) by mouth daily. 225 tablet 3  . sodium chloride (OCEAN) 0.65 % SOLN nasal spray Place 1 spray into both nostrils daily. Reported on 06/03/2015    . traZODone (DESYREL) 50  MG tablet Take 1-2 tablets (50-100 mg total) by mouth at bedtime. 180 tablet 3  . Vitamin D, Ergocalciferol, (DRISDOL) 50000 units CAPS capsule Take 1 capsule (50,000 Units total) by mouth every 7 (seven) days. 12 capsule 0   No current facility-administered medications on file prior to visit.    Review of Systems:  As per HPI- otherwise negative.   Physical Examination: Danley Danker  Vitals:   06/11/15 1312  BP: 156/69  Pulse: 69  Temp: 98.2 F (36.8 C)   Filed Vitals:   06/11/15 1312  Height: 5\' 5"  (1.651 m)  Weight: 171 lb 3.2 oz (77.656 kg)   Body mass index is 28.49 kg/(m^2). Ideal Body Weight: Weight in (lb) to have BMI = 25: 149.9  GEN: WDWN, NAD, Non-toxic, A & O x 3 HEENT: Atraumatic, Normocephalic. Neck supple. No masses, No LAD. No evidence of facial swelling or angioedema No rash or hives Ears and Nose: No external deformity. CV: RRR, No M/G/R. No JVD. No thrill. No extra heart sounds. PULM: CTA B, no wheezes, crackles, rhonchi. No retractions. No resp. distress. No accessory muscle use. ABD: S, NT, ND EXTR: No c/c/e NEURO Normal gait.  PSYCH: Normally interactive. Conversant. Not depressed or anxious appearing. Calm demeanor.   Results for orders placed or performed in visit on 06/11/15  POCT Urinalysis Dipstick  Result Value Ref Range   Color, UA yellow    Clarity, UA clear    Glucose, UA neg    Bilirubin, UA neg    Ketones, UA neg    Spec Grav, UA >=1.030    Blood, UA neg    pH, UA 5.5    Protein, UA +-    Urobilinogen, UA negative    Nitrite, UA neg    Leukocytes, UA large (3+) (A) Negative    Creat clearance is over 70 Assessment and Plan: UTI (lower urinary tract infection) - Plan: POCT Urinalysis Dipstick, Urine culture, nitrofurantoin, macrocrystal-monohydrate, (MACROBID) 100 MG capsule  Facial swelling  Treat with macrobid for possible UTI while culture is pending Her sx are non- specific. We are not sure if this was a reaction to the boniva-  it occurred several days after her dose and are not necessarily c/w an allergic reaction.  In any case, she would like to try the boniva again next month.  Think that this is reasonable and cautioned regarding possible recurrent allergic reaction and the actions that should be taken in that case   Signed Lamar Blinks, MD

## 2015-06-11 NOTE — Patient Instructions (Signed)
You may have a UTI- I am going to send our urine for a culture.   However in the meantime we will start you on macrobid for possible UTI It is difficult to tell for you sure if your symptoms for a few days ago were an allergic reaction, and if so if there was any relationship to your boniva.  However, from your description I suspect that this was not an allergic reaction.  If you feel comfortable taking the boniva again in a month I think it is ok.  However if you have any swelling or itching take 2 benadryl right away. If any tongue or mouth swelling or shortness of breath call [phone]

## 2015-06-12 DIAGNOSIS — H1132 Conjunctival hemorrhage, left eye: Secondary | ICD-10-CM | POA: Diagnosis not present

## 2015-06-14 LAB — URINE CULTURE

## 2015-07-28 ENCOUNTER — Ambulatory Visit
Admission: RE | Admit: 2015-07-28 | Discharge: 2015-07-28 | Disposition: A | Discharge status: Home / Self Care | Payer: Medicare Other | Source: Ambulatory Visit

## 2015-07-28 DIAGNOSIS — Z1231 Encounter for screening mammogram for malignant neoplasm of breast: Secondary | ICD-10-CM

## 2015-07-29 ENCOUNTER — Other Ambulatory Visit: Payer: Self-pay | Admitting: Family Medicine

## 2015-07-29 DIAGNOSIS — R928 Other abnormal and inconclusive findings on diagnostic imaging of breast: Secondary | ICD-10-CM

## 2015-08-04 ENCOUNTER — Ambulatory Visit
Admission: RE | Admit: 2015-08-04 | Discharge: 2015-08-04 | Disposition: A | Discharge status: Home / Self Care | Payer: Medicare Other | Source: Ambulatory Visit | Attending: Family Medicine | Admitting: Family Medicine

## 2015-08-04 DIAGNOSIS — R921 Mammographic calcification found on diagnostic imaging of breast: Secondary | ICD-10-CM | POA: Diagnosis not present

## 2015-08-04 DIAGNOSIS — R928 Other abnormal and inconclusive findings on diagnostic imaging of breast: Secondary | ICD-10-CM

## 2015-08-27 DIAGNOSIS — H26491 Other secondary cataract, right eye: Secondary | ICD-10-CM | POA: Diagnosis not present

## 2015-09-10 ENCOUNTER — Ambulatory Visit (INDEPENDENT_AMBULATORY_CARE_PROVIDER_SITE_OTHER): Payer: Medicare Other | Admitting: Family Medicine

## 2015-09-10 ENCOUNTER — Encounter: Payer: Self-pay | Admitting: Family Medicine

## 2015-09-10 ENCOUNTER — Ambulatory Visit: Payer: Medicare Other | Admitting: Family Medicine

## 2015-09-10 VITALS — BP 134/72 | HR 70 | Temp 98.1°F | Ht 65.0 in | Wt 169.4 lb

## 2015-09-10 DIAGNOSIS — E559 Vitamin D deficiency, unspecified: Secondary | ICD-10-CM

## 2015-09-10 DIAGNOSIS — Z87440 Personal history of urinary (tract) infections: Secondary | ICD-10-CM

## 2015-09-10 DIAGNOSIS — R8299 Other abnormal findings in urine: Secondary | ICD-10-CM | POA: Diagnosis not present

## 2015-09-10 DIAGNOSIS — R8290 Unspecified abnormal findings in urine: Secondary | ICD-10-CM

## 2015-09-10 LAB — POC URINALSYSI DIPSTICK (AUTOMATED)
BILIRUBIN UA: NEGATIVE
GLUCOSE UA: NEGATIVE
Ketones, UA: NEGATIVE
Leukocytes, UA: NEGATIVE
NITRITE UA: POSITIVE
Protein, UA: NEGATIVE
RBC UA: NEGATIVE
Spec Grav, UA: >=1.03
UROBILINOGEN UA: 4
pH, UA: 6

## 2015-09-10 NOTE — Progress Notes (Signed)
Pre visit review using our clinic tool,if applicable. No additional management support is needed unless otherwise documented below in the visit note.  

## 2015-09-10 NOTE — Patient Instructions (Signed)
We will check your vitamin D level and urine culture today- I will be back in touch with your results asap If you do have a UTI still we will treat, and we can decide what dose of vitamin D you need.

## 2015-09-10 NOTE — Progress Notes (Addendum)
[place] at [facility] [address], [place], [place] [id number] [phone]  Date:  09/10/2015   Name:  Monica Kane   DOB:  [birth year]   MRN:  [id number]  PCP:  Lamar Blinks, MD    Chief Complaint: No chief complaint on file.   History of Present Illness:  Monica Kane is a 70 y.o. very pleasant female patient who presents with the following:  Needs to recheck her vitamin D and urine today. She had a UTI in February- she did have e coli. She notes that her urine does not feel or smell quite right to her- this has been persistent since her last UTI  However the tingling feeling that she had has resolved.  No dysuria She has not seen any blood in her urine.    She was able to take her boniva - she did not have any SE this time so it seems she is not allergic- good news She took vitamin D 50k weekly for 7 weeks.    She is very pleased with her BP level today- no SE from her medications  Patient Active Problem List   Diagnosis Date Noted  . Pre-diabetes 06/05/2015  . DJD (degenerative joint disease) of knee 02/18/2014  . Cataract extraction status of left eye 01/16/2014  . Primary localized osteoarthritis of right knee   . Anxiety   . Chest pain, midsternal 07/23/2013  . Osteopenia 03/23/2013  . Kidney stones, calcium oxalate 03/23/2013  . Hypertension 03/31/2011  . Hyperlipidemia 03/31/2011  . Vitamin D deficiency 03/31/2011  . Hyperglycemia 03/31/2011  . Foot pain, left 03/24/2011  . Hallux rigidus 03/24/2011  . Metatarsalgia of left foot 03/24/2011  . Lateral epicondylitis of left elbow 02/24/2011  . Migraine 02/24/2011  . Depression 02/24/2011  . Cataract extraction status of right eye 02/24/2011  . Arthritis 02/24/2011    Past Medical History  Diagnosis Date  . Hyperlipidemia     takes Crestor nightly   . Primary localized osteoarthritis of right knee   . Hypertension     takes Lisinopril and Coreg  daily  . History of staph infection 11/2013  . Neuropathy (HCC)     takes Elavil nightly  . Joint pain   . Joint swelling   . History of colon polyps   . History of kidney stones   . Depression     takes Zoloft daily  . Anxiety     takes Ativan nightly  . Pneumonia     Past Surgical History  Procedure Laterality Date  . Abdominal hysterectomy  1989  . Lateral release right elbow  2010  . Shoulder arthroscopy w/ rotator cuff repair  2006  . Cervical spine surgery  2004    C 5-6  . Parotid gland tumor excision  1976    benign  . Diagnostic laparoscopy  1976    fibroid removed  . Appendectomy    . Colonoscopy    . Shoulder arthroscopy distal clavicle excision and open rotator cuff repair  2006  . Parotid gland tumor excision  1976    rt  . Lateral epicondyle release  03/01/2011    Procedure: TENNIS ELBOW RELEASE;  Surgeon: Lorn Junes, MD;  Location: [place];  Service: Orthopedics;  Laterality: Left;  TENOTOMY ELBOW LATERAL EPICONDYLITIS TENNIS ELBOW  . Knee arthroscopy w/ meniscectomy Right 01/31/2013    [facility]  . Left foot surgery  2013  mortons neuroma  . Cardiac catheterization  2015  . Eye surgery Bilateral     cataract  . Colonscopy    . Elbow arthroscopy  2010, 2012  . Knee arthroscopy  Oct 2014  . Foot surgery Left 2013  . Total knee arthroplasty Right 02/18/2014    Procedure: TOTAL KNEE ARTHROPLASTY;  Surgeon: Lorn Junes, MD;  Location: [place];  Service: Orthopedics;  Laterality: Right;  . Left heart catheterization with coronary angiogram N/A 07/23/2013    Procedure: LEFT HEART CATHETERIZATION WITH CORONARY ANGIOGRAM;  Surgeon: Sinclair Grooms, MD;  Location: [facility] CATH LAB;  Service: Cardiovascular;  Laterality: N/A;    Social History  Substance Use Topics  . Smoking status: Never Smoker   . Smokeless tobacco: Never Used  . Alcohol Use: Yes     Comment: 1-2 drinks weekly    Family History  Problem Relation Age of Onset  .  Cancer Mother     lung  . Cancer Father     lung; + tobacco  . Heart disease Maternal Grandfather     heart attack  . Heart disease Paternal Grandfather     heart attack  . Diabetes Sister   . Hypertension Sister   . Hyperlipidemia Sister     Allergies  Allergen Reactions  . Penicillins Swelling and Rash    Medication list has been reviewed and updated.  Current Outpatient Prescriptions on File Prior to Visit  Medication Sig Dispense Refill  . amitriptyline (ELAVIL) 25 MG tablet Take 1 tablet (25 mg total) by mouth at bedtime. 90 tablet 3  . carvedilol (COREG) 3.125 MG tablet TAKE 1 TABLET TWICE DAILY WITH FOOD. 180 tablet 3  . Estradiol 10 MCG TABS vaginal tablet Use one PV daily for 2 weeks- then use twice a week as needed 30 tablet 3  . ibandronate (BONIVA) 150 MG tablet Take 1 monthly in the morning with a glass of water, empty stomach, nothing anything else by mouth or lie down for the next 30 min. 3 tablet 4  . lisinopril (PRINIVIL,ZESTRIL) 20 MG tablet Take 1 daily 90 tablet 3  . nitroGLYCERIN (NITROSTAT) 0.4 MG SL tablet Place 1 tablet (0.4 mg total) under the tongue every 5 (five) minutes as needed for chest pain. 14 tablet 0  . rosuvastatin (CRESTOR) 10 MG tablet Take 1 tablet (10 mg total) by mouth daily. 90 tablet 3  . sertraline (ZOLOFT) 100 MG tablet Take 2.5 tablets (250 mg total) by mouth daily. 225 tablet 3  . traZODone (DESYREL) 50 MG tablet Take 1-2 tablets (50-100 mg total) by mouth at bedtime. 180 tablet 3  . nitrofurantoin, macrocrystal-monohydrate, (MACROBID) 100 MG capsule Take 1 capsule (100 mg total) by mouth 2 (two) times daily. (Patient not taking: Reported on 09/10/2015) 14 capsule 0  . Vitamin D, Ergocalciferol, (DRISDOL) 50000 units CAPS capsule Take 1 capsule (50,000 Units total) by mouth every 7 (seven) days. (Patient not taking: Reported on 09/10/2015) 12 capsule 0   No current facility-administered medications on file prior to visit.    Review of  Systems:  As per HPI- otherwise negative.   Physical Examination: Filed Vitals:   09/10/15 1433  BP: 134/72  Pulse: 70  Temp: 98.1 F (36.7 C)   Filed Vitals:   09/10/15 1433  Height: 5\' 5"  (1.651 m)  Weight: 169 lb 6.4 oz (76.839 kg)   Body mass index is 28.19 kg/(m^2). Ideal Body Weight: Weight in (lb) to have BMI = 25: 149.9  GEN: WDWN, NAD, Non-toxic, A & O x 3, looks well HEENT: Atraumatic, Normocephalic. Neck supple. No masses, No LAD. Ears and Nose: No external deformity. CV: RRR, No M/G/R. No JVD. No thrill. No extra heart sounds. PULM: CTA B, no wheezes, crackles, rhonchi. No retractions. No resp. distress. No accessory muscle use. EXTR: No c/c/e NEURO Normal gait.  PSYCH: Normally interactive. Conversant. Not depressed or anxious appearing.  Calm demeanor.    Assessment and Plan: Hx of urinary tract infection - Plan: POCT Urinalysis Dipstick (Automated), POCT Urinalysis Dipstick (Automated), Urine culture  Vitamin D deficiency - Plan: Vitamin D (25 hydroxy)  Bad odor of urine - Plan: Urine culture  Here today to recheck her vitamin D level and urine culture.  We will check your vitamin D level and urine culture today- I will be back in touch with your results asap If you do have a UTI still we will treat, and we can decide what dose of vitamin D you need  Signed Lamar Blinks, MD  Received her preliminary urine culture on 5/26- looks like a UTI again rx for macrobid- her creat clearance is over 60 so ok to use Called her and [facility]

## 2015-09-11 LAB — VITAMIN D 25 HYDROXY (VIT D DEFICIENCY, FRACTURES): VITD: 38.29 ng/mL (ref 30.00–100.00)

## 2015-09-12 MED ORDER — NITROFURANTOIN MONOHYD MACRO 100 MG PO CAPS: 100.0000 mg | ORAL_CAPSULE | Freq: Two times a day (BID) | ORAL | Status: DC

## 2015-09-12 NOTE — Addendum Note (Signed)
Addended by: Lamar Blinks C on: 09/12/2015 12:40 PM   Modules accepted: Orders

## 2015-09-13 LAB — URINE CULTURE

## 2015-09-18 DIAGNOSIS — H26491 Other secondary cataract, right eye: Secondary | ICD-10-CM | POA: Diagnosis not present

## 2016-01-05 ENCOUNTER — Other Ambulatory Visit: Payer: Self-pay | Admitting: Family Medicine

## 2016-01-05 DIAGNOSIS — R921 Mammographic calcification found on diagnostic imaging of breast: Secondary | ICD-10-CM

## 2016-01-22 ENCOUNTER — Ambulatory Visit (INDEPENDENT_AMBULATORY_CARE_PROVIDER_SITE_OTHER): Payer: Medicare Other | Admitting: Medical

## 2016-01-22 ENCOUNTER — Ambulatory Visit (HOSPITAL_BASED_OUTPATIENT_CLINIC_OR_DEPARTMENT_OTHER)
Admission: RE | Admit: 2016-01-22 | Discharge: 2016-01-22 | Disposition: A | Discharge status: Home / Self Care | Payer: Medicare Other | Source: Ambulatory Visit | Attending: Medical | Admitting: Medical

## 2016-01-22 ENCOUNTER — Encounter: Payer: Self-pay | Admitting: Medical

## 2016-01-22 VITALS — BP 141/60 | HR 72 | Temp 98.3°F | Ht 65.0 in | Wt 169.4 lb

## 2016-01-22 DIAGNOSIS — R059 Cough, unspecified: Secondary | ICD-10-CM

## 2016-01-22 DIAGNOSIS — J029 Acute pharyngitis, unspecified: Secondary | ICD-10-CM | POA: Diagnosis not present

## 2016-01-22 DIAGNOSIS — J209 Acute bronchitis, unspecified: Secondary | ICD-10-CM | POA: Diagnosis not present

## 2016-01-22 DIAGNOSIS — R05 Cough: Secondary | ICD-10-CM | POA: Diagnosis not present

## 2016-01-22 DIAGNOSIS — R0600 Dyspnea, unspecified: Secondary | ICD-10-CM | POA: Diagnosis not present

## 2016-01-22 LAB — POCT RAPID STREP A (OFFICE): Rapid Strep A Screen: NEGATIVE

## 2016-01-22 MED ORDER — HYDROCODONE-HOMATROPINE 5-1.5 MG/5ML PO SYRP: 5.0000 mL | ORAL_SOLUTION | Freq: Three times a day (TID) | ORAL | 0 refills | Status: DC | PRN

## 2016-01-22 MED ORDER — DOXYCYCLINE HYCLATE 100 MG PO TABS: 100.0000 mg | ORAL_TABLET | Freq: Two times a day (BID) | ORAL | 0 refills | Status: DC

## 2016-01-22 MED ORDER — FLUTICASONE PROPIONATE 50 MCG/ACT NA SUSP: 2.0000 | Freq: Every day | NASAL | 1 refills | Status: DC

## 2016-01-22 MED ORDER — ALBUTEROL SULFATE HFA 108 (90 BASE) MCG/ACT IN AERS: 2.0000 | INHALATION_SPRAY | Freq: Four times a day (QID) | RESPIRATORY_TRACT | 0 refills | Status: DC | PRN

## 2016-01-22 NOTE — Progress Notes (Signed)
Pre visit review using our clinic tool,if applicable. No additional management support is needed unless otherwise documented below in the visit note.  

## 2016-01-22 NOTE — Progress Notes (Addendum)
Subjective:    Patient ID: DEVANN TROXLER, female    DOB: 10/22/1945, 70 y.o.   MRN: [id number]  HPI  Pt in for some cough and congestion since last Friday. She thought was just a cold. She thinks her symptoms area worse. She is coughing up some mucous. Faint yellow tinged clear mucous. Pt fever, chills and sweats. First days felt feverish. But now feeling some chills. Pt states no wheezing. Pt is coughing during the day when talks. Also at night coughing a lot at night.  Pt used some inhalers one time when had bronchitis or pneumonia.  Pt about to do some dancing in show case.  Pt states her blood pressure is good.   Pt wants to do dance routing on Saturday.(a little under 2 minutes)   Review of Systems  Constitutional: Positive for chills, fatigue and fever.  Respiratory: Positive for cough and shortness of breath. Negative for choking, chest tightness and wheezing.        With acitivity get sob easily. Not at rest.  Cardiovascular: Negative for chest pain and palpitations.  Gastrointestinal: Negative for abdominal pain.  Musculoskeletal: Negative for back pain.       No lower ext pain.  Skin: Negative for rash.  Neurological: Negative for dizziness, syncope, speech difficulty, weakness and light-headedness.  Hematological: Negative for adenopathy. Does not bruise/bleed easily.    Past Medical History:  Diagnosis Date  . Anxiety    takes Ativan nightly  . Depression    takes Zoloft daily  . History of colon polyps   . History of kidney stones   . History of staph infection 11/2013  . Hyperlipidemia    takes Crestor nightly   . Hypertension    takes Lisinopril and Coreg daily  . Joint pain   . Joint swelling   . Neuropathy (HCC)    takes Elavil nightly  . Pneumonia   . Primary localized osteoarthritis of right knee      Social History   Social History  . Marital status: Married    Spouse name: Jood Carling  . Number of children: N/A  . Years of education:  N/A   Occupational History  . Not on file.   Social History Main Topics  . Smoking status: Never Smoker  . Smokeless tobacco: Never Used  . Alcohol use Yes     Comment: 1-2 drinks weekly  . Drug use: No  . Sexual activity: Yes    Birth control/ protection: Surgical   Other Topics Concern  . Not on file   Social History Narrative   Lives with her husband.  He has 2 adult children. Patient has a Financial risk analyst, and she does exercise.    Past Surgical History:  Procedure Laterality Date  . ABDOMINAL HYSTERECTOMY  1989  . APPENDECTOMY    . CARDIAC CATHETERIZATION  2015  . CERVICAL SPINE SURGERY  2004   C 5-6  . COLONOSCOPY    . colonscopy    . DIAGNOSTIC LAPAROSCOPY  1976   fibroid removed  . ELBOW ARTHROSCOPY  2010, 2012  . EYE SURGERY Bilateral    cataract  . FOOT SURGERY Left 2013  . KNEE ARTHROSCOPY  Oct 2014  . KNEE ARTHROSCOPY W/ MENISCECTOMY Right 01/31/2013   [facility]  . LATERAL EPICONDYLE RELEASE  03/01/2011   Procedure: TENNIS ELBOW RELEASE;  Surgeon: Lorn Junes, MD;  Location: [place];  Service: Orthopedics;  Laterality: Left;  TENOTOMY ELBOW LATERAL EPICONDYLITIS TENNIS  ELBOW  . lateral release right elbow  2010  . left foot surgery  2013   mortons neuroma  . LEFT HEART CATHETERIZATION WITH CORONARY ANGIOGRAM N/A 07/23/2013   Procedure: LEFT HEART CATHETERIZATION WITH CORONARY ANGIOGRAM;  Surgeon: Sinclair Grooms, MD;  Location: [facility] CATH LAB;  Service: Cardiovascular;  Laterality: N/A;  . PAROTID GLAND TUMOR EXCISION  1976   benign  . PAROTID GLAND TUMOR EXCISION  1976   rt  . SHOULDER ARTHROSCOPY DISTAL CLAVICLE EXCISION AND OPEN ROTATOR CUFF REPAIR  2006  . SHOULDER ARTHROSCOPY W/ ROTATOR CUFF REPAIR  2006  . TOTAL KNEE ARTHROPLASTY Right 02/18/2014   Procedure: TOTAL KNEE ARTHROPLASTY;  Surgeon: Lorn Junes, MD;  Location: [place];  Service: Orthopedics;  Laterality: Right;    Family History  Problem Relation Age of Onset  .  Cancer Mother     lung  . Cancer Father     lung; + tobacco  . Heart disease Maternal Grandfather     heart attack  . Heart disease Paternal Grandfather     heart attack  . Diabetes Sister   . Hypertension Sister   . Hyperlipidemia Sister     Allergies  Allergen Reactions  . Penicillins Swelling and Rash    Current Outpatient Prescriptions on File Prior to Visit  Medication Sig Dispense Refill  . amitriptyline (ELAVIL) 25 MG tablet Take 1 tablet (25 mg total) by mouth at bedtime. 90 tablet 3  . carvedilol (COREG) 3.125 MG tablet TAKE 1 TABLET TWICE DAILY WITH FOOD. 180 tablet 3  . Estradiol 10 MCG TABS vaginal tablet Use one PV daily for 2 weeks- then use twice a week as needed 30 tablet 3  . ibandronate (BONIVA) 150 MG tablet Take 1 monthly in the morning with a glass of water, empty stomach, nothing anything else by mouth or lie down for the next 30 min. 3 tablet 4  . lisinopril (PRINIVIL,ZESTRIL) 20 MG tablet Take 1 daily 90 tablet 3  . nitroGLYCERIN (NITROSTAT) 0.4 MG SL tablet Place 1 tablet (0.4 mg total) under the tongue every 5 (five) minutes as needed for chest pain. 14 tablet 0  . rosuvastatin (CRESTOR) 10 MG tablet Take 1 tablet (10 mg total) by mouth daily. 90 tablet 3  . sertraline (ZOLOFT) 100 MG tablet Take 2.5 tablets (250 mg total) by mouth daily. 225 tablet 3  . traZODone (DESYREL) 50 MG tablet Take 1-2 tablets (50-100 mg total) by mouth at bedtime. 180 tablet 3  . Vitamin D, Ergocalciferol, (DRISDOL) 50000 units CAPS capsule Take 1 capsule (50,000 Units total) by mouth every 7 (seven) days. 12 capsule 0   No current facility-administered medications on file prior to visit.     BP (!) 141/60   Pulse 72   Temp 98.3 F (36.8 C) (Oral)   Ht 5\' 5"  (1.651 m)   Wt 169 lb 6.4 oz (76.8 kg)   SpO2 99%   BMI 28.19 kg/m       Objective:   Physical Exam  General  Mental Status - Alert. General Appearance - Well groomed. Not in acute distress.  Skin Rashes-  No Rashes.  HEENT Head- Normal. Ear Auditory Canal - Left- Normal. Right - Normal.Tympanic Membrane- Left- Normal. Right- Normal. Eye Sclera/Conjunctiva- Left- Normal. Right- Normal. Nose & Sinuses Nasal Mucosa- Left-  Boggy and Congested. Right-  Boggy and  Congested.Bilateral maxillary and frontal sinus pressure. Mouth & Throat Lips: Upper Lip- Normal: no dryness, cracking, pallor, cyanosis,  or vesicular eruption. Lower Lip-Normal: no dryness, cracking, pallor, cyanosis or vesicular eruption. Buccal Mucosa- Bilateral- No Aphthous ulcers. Oropharynx- No Discharge or Erythema. Tonsils: Characteristics- Bilateral- No Erythema or Congestion. Size/Enlargement- Bilateral- No enlargement. Discharge- bilateral-None.  Neck Neck- Supple. No Masses.   Chest and Lung Exam Auscultation: Breath Sounds:-Clear even and unlabored.  Cardiovascular Auscultation:Rythm- Regular, rate and rhythm. Murmurs & Other Heart Sounds:Ausculatation of the heart reveal- No Murmurs.  Lymphatic Head & Neck General Head & Neck Lymphatics: Bilateral: Description- No Localized lymphadenopathy.  Lower ext- no pedal edema. Negative homans signs.       Assessment & Plan:  For your bronchitis type symptoms will give antibiotic. But want to do a cxr first since if pneumonia would give a stronger type antibiotic. Considered getting cbc today to assess infection fighting cells but lab closed.  For cough will rx hycodan.  For sob or wheezing will rx albuterol inhaler.  Offered prednisone taper dose for potential reactive airways/sob related. Pt declined.   For your nasal congestion can rx flonase nasal spray.  Follow up in 7 days or as needed  Xray did not show pneumonia. So I sent in rx of doxycycline.(not may help her nares irritation as well)  Rosabell Geyer, Percell Miller, PA-C

## 2016-01-22 NOTE — Addendum Note (Signed)
Addended by: Anabel Halon on: 01/22/2016 06:58 PM   Modules accepted: Orders

## 2016-01-22 NOTE — Patient Instructions (Addendum)
For your bronchitis type symptoms will give antibiotic. But want to do a cxr first since if pneumonia would give a stronger type antibiotic. Considered getting cbc today to assess infection fighting cells but lab closed.  For cough will rx hycodan.  For sob or wheezing will rx albuterol inhaler.  Offered prednisone taper dose for potential reactive airways/sob related. Pt declined.   For your nasal congestion can rx flonase nasal spray.  Recommend not doing dance routine on Saturday.  Follow up in 7 days or as needed  Rapid strep test was negative.  Xray did not show pneumonia. So I sent in rx of doxycycline.(not may help her nares irritation as well)

## 2016-01-22 NOTE — Addendum Note (Signed)
Addended by: Bunnie Domino on: 01/22/2016 05:44 PM   Modules accepted: Orders

## 2016-01-26 ENCOUNTER — Other Ambulatory Visit: Payer: Self-pay | Admitting: Family Medicine

## 2016-01-26 DIAGNOSIS — G4700 Insomnia, unspecified: Secondary | ICD-10-CM

## 2016-01-26 DIAGNOSIS — G609 Hereditary and idiopathic neuropathy, unspecified: Secondary | ICD-10-CM

## 2016-01-27 ENCOUNTER — Other Ambulatory Visit: Payer: Self-pay | Admitting: Emergency Medicine

## 2016-01-27 DIAGNOSIS — G4700 Insomnia, unspecified: Secondary | ICD-10-CM

## 2016-01-27 DIAGNOSIS — G609 Hereditary and idiopathic neuropathy, unspecified: Secondary | ICD-10-CM

## 2016-01-27 MED ORDER — TRAZODONE HCL 50 MG PO TABS: 50.0000 mg | ORAL_TABLET | Freq: Every day | ORAL | 3 refills | Status: DC

## 2016-01-27 MED ORDER — AMITRIPTYLINE HCL 25 MG PO TABS: 25.0000 mg | ORAL_TABLET | Freq: Every day | ORAL | 3 refills | Status: DC

## 2016-01-29 ENCOUNTER — Encounter: Payer: Self-pay | Admitting: Medical

## 2016-01-30 NOTE — Telephone Encounter (Signed)
Called patient and advised she be seen at Saturday clinic [facility]).  Pt agreed. Appt scheduled.

## 2016-01-31 ENCOUNTER — Encounter: Payer: Self-pay | Admitting: Family Medicine

## 2016-01-31 ENCOUNTER — Ambulatory Visit (INDEPENDENT_AMBULATORY_CARE_PROVIDER_SITE_OTHER): Payer: Medicare Other | Admitting: Family Medicine

## 2016-01-31 DIAGNOSIS — R059 Cough, unspecified: Secondary | ICD-10-CM | POA: Insufficient documentation

## 2016-01-31 DIAGNOSIS — R05 Cough: Secondary | ICD-10-CM | POA: Diagnosis not present

## 2016-01-31 MED ORDER — HYDROCOD POLST-CPM POLST ER 10-8 MG/5ML PO SUER: 5.0000 mL | Freq: Two times a day (BID) | ORAL | 0 refills | Status: DC | PRN

## 2016-01-31 NOTE — Patient Instructions (Signed)
Great to meet you.  Start using your albuterol as needed.  tussionex as needed for cough will make you sleepy.

## 2016-01-31 NOTE — Progress Notes (Signed)
Pre visit review using our clinic review tool, if applicable. No additional management support is needed unless otherwise documented below in the visit note. 

## 2016-01-31 NOTE — Progress Notes (Signed)
Subjective:   Patient ID: Monica Kane, female    DOB: 08/01/1945, 70 y.o.   MRN: [id number]  Monica Kane is a pleasant 70 y.o. year old female patient of Dr. Lorelei Pont, new to me,  who presents to weekend clinic today with Cough (wet sounding, sob, chest heaviness, x 1 week ago thursday )  on 01/31/2016  HPI:  Johnston Ebbs on 01/22/16 for cough. Note reviewed. CXR showed mild bronchitis changes without acute infiltrate.  Diagnosed with bronchitis.   Placed on doxycyline, Albuterol as needed, flonase.  Here today because cough is still wet, still having chest heaviness with wet cough.  She didn't realize that she had an albuterol inhaler sent in for her so she has not been taking it. Current Outpatient Prescriptions on File Prior to Visit  Medication Sig Dispense Refill  . albuterol (PROVENTIL HFA;VENTOLIN HFA) 108 (90 Base) MCG/ACT inhaler Inhale 2 puffs into the lungs every 6 (six) hours as needed for wheezing or shortness of breath. 1 Inhaler 0  . amitriptyline (ELAVIL) 25 MG tablet Take 1 tablet (25 mg total) by mouth at bedtime. 90 tablet 3  . carvedilol (COREG) 3.125 MG tablet TAKE 1 TABLET TWICE DAILY WITH FOOD. 180 tablet 3  . doxycycline (VIBRA-TABS) 100 MG tablet Take 1 tablet (100 mg total) by mouth 2 (two) times daily. 20 tablet 0  . Estradiol 10 MCG TABS vaginal tablet Use one PV daily for 2 weeks- then use twice a week as needed 30 tablet 3  . fluticasone (FLONASE) 50 MCG/ACT nasal spray Place 2 sprays into both nostrils daily. 16 g 1  . HYDROcodone-homatropine (HYCODAN) 5-1.5 MG/5ML syrup Take 5 mLs by mouth every 8 (eight) hours as needed for cough. 120 mL 0  . ibandronate (BONIVA) 150 MG tablet Take 1 monthly in the morning with a glass of water, empty stomach, nothing anything else by mouth or lie down for the next 30 min. 3 tablet 4  . lisinopril (PRINIVIL,ZESTRIL) 20 MG tablet Take 1 daily 90 tablet 3  . nitroGLYCERIN (NITROSTAT) 0.4 MG SL tablet Place 1  tablet (0.4 mg total) under the tongue every 5 (five) minutes as needed for chest pain. 14 tablet 0  . rosuvastatin (CRESTOR) 10 MG tablet Take 1 tablet (10 mg total) by mouth daily. 90 tablet 3  . sertraline (ZOLOFT) 100 MG tablet Take 2.5 tablets (250 mg total) by mouth daily. 225 tablet 3  . traZODone (DESYREL) 50 MG tablet Take 1-2 tablets (50-100 mg total) by mouth at bedtime. 180 tablet 3  . Vitamin D, Ergocalciferol, (DRISDOL) 50000 units CAPS capsule Take 1 capsule (50,000 Units total) by mouth every 7 (seven) days. 12 capsule 0   No current facility-administered medications on file prior to visit.     Allergies  Allergen Reactions  . Penicillins Swelling and Rash    Past Medical History:  Diagnosis Date  . Anxiety    takes Ativan nightly  . Depression    takes Zoloft daily  . History of colon polyps   . History of kidney stones   . History of staph infection 11/2013  . Hyperlipidemia    takes Crestor nightly   . Hypertension    takes Lisinopril and Coreg daily  . Joint pain   . Joint swelling   . Neuropathy (HCC)    takes Elavil nightly  . Pneumonia   . Primary localized osteoarthritis of right knee     Past Surgical History:  Procedure Laterality  Date  . ABDOMINAL HYSTERECTOMY  1989  . APPENDECTOMY    . CARDIAC CATHETERIZATION  2015  . CERVICAL SPINE SURGERY  2004   C 5-6  . COLONOSCOPY    . colonscopy    . DIAGNOSTIC LAPAROSCOPY  1976   fibroid removed  . ELBOW ARTHROSCOPY  2010, 2012  . EYE SURGERY Bilateral    cataract  . FOOT SURGERY Left 2013  . KNEE ARTHROSCOPY  Oct 2014  . KNEE ARTHROSCOPY W/ MENISCECTOMY Right 01/31/2013   [facility]  . LATERAL EPICONDYLE RELEASE  03/01/2011   Procedure: TENNIS ELBOW RELEASE;  Surgeon: Lorn Junes, MD;  Location: [place];  Service: Orthopedics;  Laterality: Left;  TENOTOMY ELBOW LATERAL EPICONDYLITIS TENNIS ELBOW  . lateral release right elbow  2010  . left foot surgery  2013   mortons  neuroma  . LEFT HEART CATHETERIZATION WITH CORONARY ANGIOGRAM N/A 07/23/2013   Procedure: LEFT HEART CATHETERIZATION WITH CORONARY ANGIOGRAM;  Surgeon: Sinclair Grooms, MD;  Location: [facility] CATH LAB;  Service: Cardiovascular;  Laterality: N/A;  . PAROTID GLAND TUMOR EXCISION  1976   benign  . PAROTID GLAND TUMOR EXCISION  1976   rt  . SHOULDER ARTHROSCOPY DISTAL CLAVICLE EXCISION AND OPEN ROTATOR CUFF REPAIR  2006  . SHOULDER ARTHROSCOPY W/ ROTATOR CUFF REPAIR  2006  . TOTAL KNEE ARTHROPLASTY Right 02/18/2014   Procedure: TOTAL KNEE ARTHROPLASTY;  Surgeon: Lorn Junes, MD;  Location: [place];  Service: Orthopedics;  Laterality: Right;    Family History  Problem Relation Age of Onset  . Cancer Mother     lung  . Cancer Father     lung; + tobacco  . Heart disease Maternal Grandfather     heart attack  . Heart disease Paternal Grandfather     heart attack  . Diabetes Sister   . Hypertension Sister   . Hyperlipidemia Sister     Social History   Social History  . Marital status: Married    Spouse name: Kusum Gunion  . Number of children: N/A  . Years of education: N/A   Occupational History  . Not on file.   Social History Main Topics  . Smoking status: Never Smoker  . Smokeless tobacco: Never Used  . Alcohol use Yes     Comment: 1-2 drinks weekly  . Drug use: No  . Sexual activity: Yes    Birth control/ protection: Surgical   Other Topics Concern  . Not on file   Social History Narrative   Lives with her husband.  He has 2 adult children. Patient has a Financial risk analyst, and she does exercise.   The PMH, PSH, Social History, Family History, Medications, and allergies have been reviewed in [facility], and have been updated if relevant.  Review of Systems  Constitutional: Negative.   HENT: Positive for congestion and postnasal drip.   Eyes: Negative.   Respiratory: Positive for cough, chest tightness, shortness of breath and wheezing. Negative for stridor.     Cardiovascular: Negative.   Gastrointestinal: Negative.   Skin: Negative.   Neurological: Negative.   All other systems reviewed and are negative.      Objective:    BP 120/84 (BP Location: Left Arm, Patient Position: Sitting, Cuff Size: Large)   Pulse 68   Temp 98.6 F (37 C) (Oral)   Resp 18   Ht 5\' 5"  (1.651 m)   Wt 164 lb (74.4 kg)   SpO2 98%   BMI 27.29  kg/m    Physical Exam  Constitutional: She is oriented to person, place, and time. She appears well-developed and well-nourished. No distress.  HENT:  Head: Normocephalic.  Eyes: Conjunctivae are normal.  Cardiovascular: Normal rate and regular rhythm.   Pulmonary/Chest: Effort normal and breath sounds normal. No respiratory distress. She has no wheezes.  Musculoskeletal: Normal range of motion.  Neurological: She is alert and oriented to person, place, and time.  Skin: She is not diaphoretic.  Psychiatric: She has a normal mood and affect. Her behavior is normal. Judgment and thought content normal.  Nursing note and vitals reviewed.         Assessment & Plan:   Cough No Follow-up on file.

## 2016-01-31 NOTE — Assessment & Plan Note (Signed)
Persistent.  Lung sounds reassuring. Advised albuterol as needed for cough/wheezing.  tussionex rx given to pt for severe cough at night- discussed sedation precautions.

## 2016-02-10 ENCOUNTER — Inpatient Hospital Stay: Admission: RE | Admit: 2016-02-10 | Payer: Medicare Other | Source: Ambulatory Visit

## 2016-02-11 ENCOUNTER — Other Ambulatory Visit: Payer: Self-pay | Admitting: Family Medicine

## 2016-02-11 ENCOUNTER — Ambulatory Visit
Admission: RE | Admit: 2016-02-11 | Discharge: 2016-02-11 | Disposition: A | Discharge status: Home / Self Care | Payer: Medicare Other | Source: Ambulatory Visit | Attending: Family Medicine | Admitting: Family Medicine

## 2016-02-11 DIAGNOSIS — R921 Mammographic calcification found on diagnostic imaging of breast: Secondary | ICD-10-CM

## 2016-02-16 ENCOUNTER — Ambulatory Visit
Admission: RE | Admit: 2016-02-16 | Discharge: 2016-02-16 | Disposition: A | Discharge status: Home / Self Care | Payer: Medicare Other | Source: Ambulatory Visit | Attending: Family Medicine | Admitting: Family Medicine

## 2016-02-16 ENCOUNTER — Other Ambulatory Visit: Payer: Self-pay | Admitting: Family Medicine

## 2016-02-16 DIAGNOSIS — N6489 Other specified disorders of breast: Secondary | ICD-10-CM | POA: Diagnosis not present

## 2016-02-16 DIAGNOSIS — R921 Mammographic calcification found on diagnostic imaging of breast: Secondary | ICD-10-CM

## 2016-02-16 HISTORY — PX: BREAST BIOPSY: SHX20

## 2016-03-01 ENCOUNTER — Other Ambulatory Visit: Payer: Self-pay | Admitting: Family Medicine

## 2016-03-01 DIAGNOSIS — F321 Major depressive disorder, single episode, moderate: Secondary | ICD-10-CM

## 2016-03-09 ENCOUNTER — Other Ambulatory Visit: Payer: Self-pay | Admitting: Family Medicine

## 2016-03-09 DIAGNOSIS — I10 Essential (primary) hypertension: Secondary | ICD-10-CM

## 2016-03-16 ENCOUNTER — Other Ambulatory Visit: Payer: Self-pay | Admitting: Family Medicine

## 2016-03-16 DIAGNOSIS — E785 Hyperlipidemia, unspecified: Secondary | ICD-10-CM

## 2016-04-21 ENCOUNTER — Other Ambulatory Visit: Payer: Self-pay | Admitting: Family Medicine

## 2016-04-21 DIAGNOSIS — E785 Hyperlipidemia, unspecified: Secondary | ICD-10-CM

## 2016-04-22 ENCOUNTER — Other Ambulatory Visit: Payer: Self-pay | Admitting: Emergency Medicine

## 2016-04-22 DIAGNOSIS — E785 Hyperlipidemia, unspecified: Secondary | ICD-10-CM

## 2016-04-22 MED ORDER — ROSUVASTATIN CALCIUM 10 MG PO TABS: 10.0000 mg | ORAL_TABLET | Freq: Every day | ORAL | 0 refills | Status: DC

## 2016-05-12 ENCOUNTER — Other Ambulatory Visit: Payer: Self-pay | Admitting: Medical

## 2016-05-12 DIAGNOSIS — F321 Major depressive disorder, single episode, moderate: Secondary | ICD-10-CM

## 2016-06-01 DIAGNOSIS — M5136 Other intervertebral disc degeneration, lumbar region: Secondary | ICD-10-CM | POA: Diagnosis not present

## 2016-06-01 DIAGNOSIS — M2578 Osteophyte, vertebrae: Secondary | ICD-10-CM | POA: Diagnosis not present

## 2016-06-01 DIAGNOSIS — M5137 Other intervertebral disc degeneration, lumbosacral region: Secondary | ICD-10-CM | POA: Diagnosis not present

## 2016-06-10 ENCOUNTER — Other Ambulatory Visit: Payer: Self-pay | Admitting: Family Medicine

## 2016-06-10 DIAGNOSIS — I10 Essential (primary) hypertension: Secondary | ICD-10-CM

## 2016-06-10 DIAGNOSIS — E785 Hyperlipidemia, unspecified: Secondary | ICD-10-CM

## 2016-06-14 ENCOUNTER — Other Ambulatory Visit: Payer: Self-pay | Admitting: Emergency Medicine

## 2016-06-14 DIAGNOSIS — I10 Essential (primary) hypertension: Secondary | ICD-10-CM

## 2016-06-14 DIAGNOSIS — E785 Hyperlipidemia, unspecified: Secondary | ICD-10-CM

## 2016-06-14 MED ORDER — ROSUVASTATIN CALCIUM 10 MG PO TABS: 10.0000 mg | ORAL_TABLET | Freq: Every day | ORAL | 0 refills | Status: DC

## 2016-06-14 MED ORDER — CARVEDILOL 3.125 MG PO TABS: 3.1250 mg | ORAL_TABLET | Freq: Two times a day (BID) | ORAL | 0 refills | Status: DC

## 2016-07-22 ENCOUNTER — Other Ambulatory Visit: Payer: Self-pay | Admitting: Family Medicine

## 2016-07-22 DIAGNOSIS — E785 Hyperlipidemia, unspecified: Secondary | ICD-10-CM

## 2016-08-10 NOTE — Progress Notes (Addendum)
Subjective:   Monica Kane is a 71 y.o. female who presents for an Initial Medicare Annual Wellness Visit.  Review of Systems    No ROS.  Medicare Wellness Visit. Cardiac Risk Factors include: advanced age (>66men, >44 women);hypertension;dyslipidemia Sleep patterns: Takes trazodone to fall asleep. Sleeps 8-10 hrs. Feels rested. Home Safety/Smoke Alarms: Feels safe in home. Smoke alarms in place.   Living environment; residence and Firearm Safety: Lives with husband in story homes.No guns. Seat Belt Safety/Bike Helmet: Wears seat belt.   Counseling:   Eye Exam- Hx of Cataract sx--one for close up and one for distance. Dr.McQuen yearly. Dental- Every 6 months and as needed.  Female:   Pap-  Hysterectomy   Mammo- Last 02/11/16:    BI-RADS CATEGORY  4: Suspicious. Clip placed.   Dexa scan- Last 03/31/15: Osteopenia     CCS- Last 05/27/13: diverticulosis     Objective:    Today's Vitals   08/12/16 1310  BP: 130/68  Pulse: 66  SpO2: 96%  Weight: 153 lb (69.4 kg)  Height: 5\' 5"  (1.651 m)   Body mass index is 25.46 kg/m.   Current Medications (verified) Outpatient Encounter Prescriptions as of 08/12/2016  Medication Sig  . amitriptyline (ELAVIL) 25 MG tablet Take 1 tablet (25 mg total) by mouth at bedtime.  . carvedilol (COREG) 3.125 MG tablet Take 1 tablet (3.125 mg total) by mouth 2 (two) times daily with a meal.  . fluticasone (FLONASE) 50 MCG/ACT nasal spray Place 2 sprays into both nostrils daily.  Marland Kitchen ibandronate (BONIVA) 150 MG tablet Take 1 monthly in the morning with a glass of water, empty stomach, nothing anything else by mouth or lie down for the next 30 min.  Marland Kitchen lisinopril (PRINIVIL,ZESTRIL) 20 MG tablet Take 1 daily  . nitroGLYCERIN (NITROSTAT) 0.4 MG SL tablet Place 1 tablet (0.4 mg total) under the tongue every 5 (five) minutes as needed for chest pain.  . rosuvastatin (CRESTOR) 10 MG tablet TAKE 1 TABLET ONCE DAILY.  Marland Kitchen sertraline (ZOLOFT) 100 MG tablet TAKE 2  & 1/2 TABLETS DAILY.  . traZODone (DESYREL) 50 MG tablet Take 1-2 tablets (50-100 mg total) by mouth at bedtime.  . Estradiol 10 MCG TABS vaginal tablet Use one PV daily for 2 weeks- then use twice a week as needed (Patient not taking: Reported on 08/12/2016)  . [DISCONTINUED] albuterol (PROVENTIL HFA;VENTOLIN HFA) 108 (90 Base) MCG/ACT inhaler Inhale 2 puffs into the lungs every 6 (six) hours as needed for wheezing or shortness of breath.  . [DISCONTINUED] chlorpheniramine-HYDROcodone (TUSSIONEX PENNKINETIC ER) 10-8 MG/5ML SUER Take 5 mLs by mouth every 12 (twelve) hours as needed.  . [DISCONTINUED] doxycycline (VIBRA-TABS) 100 MG tablet Take 1 tablet (100 mg total) by mouth 2 (two) times daily.   No facility-administered encounter medications on file as of 08/12/2016.     Allergies (verified) Penicillins   History: Past Medical History:  Diagnosis Date  . Anxiety    takes Ativan nightly  . Depression    takes Zoloft daily  . History of colon polyps   . History of kidney stones   . History of staph infection 11/2013  . Hyperlipidemia    takes Crestor nightly   . Hypertension    takes Lisinopril and Coreg daily  . Joint pain   . Joint swelling   . Neuropathy    takes Elavil nightly  . Pneumonia   . Primary localized osteoarthritis of right knee    Past Surgical History:  Procedure  Laterality Date  . ABDOMINAL HYSTERECTOMY  1989  . APPENDECTOMY    . CARDIAC CATHETERIZATION  2015  . CERVICAL SPINE SURGERY  2004   C 5-6  . COLONOSCOPY    . colonscopy    . DIAGNOSTIC LAPAROSCOPY  1976   fibroid removed  . ELBOW ARTHROSCOPY  2010, 2012  . EYE SURGERY Bilateral    cataract  . FOOT SURGERY Left 2013  . KNEE ARTHROSCOPY  Oct 2014  . KNEE ARTHROSCOPY W/ MENISCECTOMY Right 01/31/2013   [facility]  . LATERAL EPICONDYLE RELEASE  03/01/2011   Procedure: TENNIS ELBOW RELEASE;  Surgeon: Lorn Junes, MD;  Location: [place];  Service: Orthopedics;  Laterality: Left;   TENOTOMY ELBOW LATERAL EPICONDYLITIS TENNIS ELBOW  . lateral release right elbow  2010  . left foot surgery  2013   mortons neuroma  . LEFT HEART CATHETERIZATION WITH CORONARY ANGIOGRAM N/A 07/23/2013   Procedure: LEFT HEART CATHETERIZATION WITH CORONARY ANGIOGRAM;  Surgeon: Sinclair Grooms, MD;  Location: [facility] CATH LAB;  Service: Cardiovascular;  Laterality: N/A;  . PAROTID GLAND TUMOR EXCISION  1976   benign  . PAROTID GLAND TUMOR EXCISION  1976   rt  . SHOULDER ARTHROSCOPY DISTAL CLAVICLE EXCISION AND OPEN ROTATOR CUFF REPAIR  2006  . SHOULDER ARTHROSCOPY W/ ROTATOR CUFF REPAIR  2006  . TOTAL KNEE ARTHROPLASTY Right 02/18/2014   Procedure: TOTAL KNEE ARTHROPLASTY;  Surgeon: Lorn Junes, MD;  Location: [place];  Service: Orthopedics;  Laterality: Right;   Family History  Problem Relation Age of Onset  . Cancer Mother     lung  . Cancer Father     lung; + tobacco  . Heart disease Maternal Grandfather     heart attack  . Heart disease Paternal Grandfather     heart attack  . Diabetes Sister   . Hypertension Sister   . Hyperlipidemia Sister    Social History   Occupational History  . Not on file.   Social History Main Topics  . Smoking status: Never Smoker  . Smokeless tobacco: Never Used  . Alcohol use Yes     Comment: 1-2 drinks weekly  . Drug use: No  . Sexual activity: Yes    Birth control/ protection: Surgical    Tobacco Counseling Counseling given: No   Activities of Daily Living In your present state of health, do you have any difficulty performing the following activities: 08/12/2016 08/12/2016  Hearing? N N  Vision? N -  Difficulty concentrating or making decisions? N -  Walking or climbing stairs? N -  Dressing or bathing? N -  Doing errands, shopping? N -  Preparing Food and eating ? N -  Using the Toilet? N -  In the past six months, have you accidently leaked urine? N -  Do you have problems with loss of bowel control? N -  Managing your  Medications? N -  Managing your Finances? N -  Housekeeping or managing your Housekeeping? N -  Some recent data might be hidden    Immunizations and Health Maintenance Immunization History  Administered Date(s) Administered  . Influenza,inj,Quad PF,36+ Mos 01/02/2014, 01/22/2015  . Pneumococcal Conjugate-13 01/22/2015  . Pneumococcal Polysaccharide-23 03/23/2013  . Td 02/18/2008   There are no preventive care reminders to display for this patient.  Patient Care Team: Darreld Mclean, MD as PCP - General (Family Medicine)  Indicate any recent Medical Services you may have received from other than [facility] providers in  the past year (date may be approximate).     Assessment:   This is a routine wellness examination for Monica Kane. Physical assessment deferred to PCP.  Hearing/Vision screen  Visual Acuity Screening   Right eye Left eye Both eyes  Without correction:   20/20  With correction:       Dietary issues and exercise activities discussed: Current Exercise Habits: Structured exercise class, Time (Minutes): 40, Frequency (Times/Week): 3, Weekly Exercise (Minutes/Week): 120, Intensity: Moderate   Diet (meal preparation, eat out, water intake, caffeinated beverages, dairy products, fruits and vegetables): in general, a "healthy" diet     Goals    . Maintain current healthy lifestyle.      Depression Screen PHQ 2/9 Scores 08/12/2016 09/10/2015 06/03/2015 01/22/2015 04/10/2014  PHQ - 2 Score 0 0 0 0 0  Exception Documentation - Patient refusal - - -    Fall Risk Fall Risk  08/12/2016 09/10/2015 01/22/2015 04/10/2014  Falls in the past year? No No No No    Cognitive Function:        Screening Tests Health Maintenance  Topic Date Due  . INFLUENZA VACCINE  11/17/2016  . MAMMOGRAM  08/03/2017  . TETANUS/TDAP  02/17/2018  . COLONOSCOPY  06/06/2018  . DEXA SCAN  Completed  . Hepatitis C Screening  Completed  . PNA vac Low Risk Adult  Completed      Plan:     Follow  up with Dr.Copland as scheduled.  Continue to eat heart healthy diet (full of fruits, vegetables, whole grains, lean protein, water--limit salt, fat, and sugar intake) and increase physical activity as tolerated.  During the course of the visit, Janesa was educated and counseled about the following appropriate screening and preventive services:   Vaccines to include Pneumoccal, Influenza, Td, HCV  Cardiovascular disease screening  Colorectal cancer screening  Bone density screening  Diabetes screening  Glaucoma screening  Mammography/PAP  Nutrition counseling   Patient Instructions (the written plan) were given to the patient.    Shela Nevin, [place]   08/12/2016   I have reviewed the above note by Ms. Vevelyn Royals and agree with her documentation Denny Peon MD

## 2016-08-10 NOTE — Progress Notes (Signed)
Pre visit review using our clinic review tool, if applicable. No additional management support is needed unless otherwise documented below in the visit note. 

## 2016-08-12 ENCOUNTER — Ambulatory Visit (INDEPENDENT_AMBULATORY_CARE_PROVIDER_SITE_OTHER): Payer: Medicare Other | Admitting: *Deleted

## 2016-08-12 ENCOUNTER — Encounter: Payer: Self-pay | Admitting: *Deleted

## 2016-08-12 VITALS — BP 130/68 | HR 66 | Ht 65.0 in | Wt 153.0 lb

## 2016-08-12 DIAGNOSIS — Z0000 Encounter for general adult medical examination without abnormal findings: Secondary | ICD-10-CM | POA: Diagnosis not present

## 2016-08-12 DIAGNOSIS — R739 Hyperglycemia, unspecified: Secondary | ICD-10-CM | POA: Diagnosis not present

## 2016-08-12 LAB — COMPREHENSIVE METABOLIC PANEL
ALBUMIN: 4.3 g/dL (ref 3.6–5.1)
ALT: 12 U/L (ref 6–29)
AST: 15 U/L (ref 10–35)
Alkaline Phosphatase: 69 U/L (ref 33–130)
BUN: 12 mg/dL (ref 7–25)
CALCIUM: 9.6 mg/dL (ref 8.6–10.4)
CO2: 24 mmol/L (ref 20–31)
CREATININE: 0.81 mg/dL (ref 0.50–0.99)
Chloride: 105 mmol/L (ref 98–110)
GLUCOSE: 101 mg/dL — AB (ref 65–99)
Potassium: 4.4 mmol/L (ref 3.5–5.3)
Sodium: 142 mmol/L (ref 135–146)
Total Bilirubin: 0.7 mg/dL (ref 0.2–1.2)
Total Protein: 6.4 g/dL (ref 6.1–8.1)

## 2016-08-12 NOTE — Patient Instructions (Addendum)
  Monica Kane , Thank you for taking time to come for your Medicare Wellness Visit. I appreciate your ongoing commitment to your health goals. Please review the following plan we discussed and let me know if I can assist you in the future.   Continue to eat heart healthy diet (full of fruits, vegetables, whole grains, lean protein, water--limit salt, fat, and sugar intake) and increase physical activity as tolerated.   These are the goals we discussed: Goals    . Maintain current healthy lifestyle.       This is a list of the screening recommended for you and due dates:  Health Maintenance  Topic Date Due  . Flu Shot  11/17/2016  . Mammogram  08/03/2017  . Tetanus Vaccine  02/17/2018  . Colon Cancer Screening  06/06/2018  . DEXA scan (bone density measurement)  Completed  .  Hepatitis C: One time screening is recommended by Center for Disease Control  (CDC) for  adults born from [birth year] through 1963.   Completed  . Pneumonia vaccines  Completed   Go to the lab today.

## 2016-08-13 LAB — HEMOGLOBIN A1C
HEMOGLOBIN A1C: 5.7 % — AB (ref <5.7)
Mean Plasma Glucose: 117 mg/dL

## 2016-08-16 ENCOUNTER — Other Ambulatory Visit: Payer: Self-pay | Admitting: Family Medicine

## 2016-08-16 DIAGNOSIS — I10 Essential (primary) hypertension: Secondary | ICD-10-CM

## 2016-08-17 ENCOUNTER — Other Ambulatory Visit: Payer: Self-pay | Admitting: Emergency Medicine

## 2016-08-18 ENCOUNTER — Ambulatory Visit (INDEPENDENT_AMBULATORY_CARE_PROVIDER_SITE_OTHER): Payer: Medicare Other | Admitting: Family Medicine

## 2016-08-18 VITALS — BP 140/82 | HR 61 | Temp 98.1°F | Ht 65.0 in | Wt 153.6 lb

## 2016-08-18 DIAGNOSIS — R7303 Prediabetes: Secondary | ICD-10-CM | POA: Diagnosis not present

## 2016-08-18 DIAGNOSIS — I781 Nevus, non-neoplastic: Secondary | ICD-10-CM

## 2016-08-18 DIAGNOSIS — J3489 Other specified disorders of nose and nasal sinuses: Secondary | ICD-10-CM

## 2016-08-18 MED ORDER — MUPIROCIN 2 % EX OINT: 1.0000 "application " | TOPICAL_OINTMENT | Freq: Two times a day (BID) | CUTANEOUS | 0 refills | Status: DC

## 2016-08-18 NOTE — Patient Instructions (Signed)
It was great to see you today- cong rats on weight loss!   Your A1c (average blood sugar) is better!  Try the mupirocin ointment in your nose twice a day for 5 days If you wish to have your veins treated, I would recommend that you contact the [facility] [place] Address: [address] [place], [place] [address]   Phone: [phone]  Take care and please see me in about 6 months to check on how you are doing

## 2016-08-18 NOTE — Progress Notes (Signed)
[place] at [facility] [address], [place], [place] [address] [phone]  Date:  08/18/2016   Name:  Monica Kane   DOB:  1945-08-03   MRN:  [id number]  PCP:  Lamar Blinks, MD    Chief Complaint: Varicose Veins (c/o bilateral varicose veins in feet and legs and possibly x 3-4 years) and Sore (c/o sore on the inside of both nostrils. pt states that she has had this in the past.)   History of Present Illness:  Monica Kane is a 71 y.o. very pleasant female patient who presents with the following:  Last seen by myself about one year ago She has noted some redness in her feet and lower legs especially with heat or exertion for the last 4-5 years. She got worried as someone told her that this might indicate either a heart problem or DM   She also has some spider veins on her shins which can be uncomfortable.  Also they will bleed if she has any minor wound s the veins are very superficial No CP, no SOB, no orthopnea Her legs will swell if she stands or sits too long- she does wear compression for travel  She did have some labs done last month Her A1c has improved through weight loss- she has lost approx 20 lbs through diet and exercise changes and is very pleased with her progress  she does have a history of pre-diabetes.  Her A1c was checked at [facility] recently and looked better  Wt Readings from Last 3 Encounters:  08/18/16 153 lb 9.6 oz (69.7 kg)  08/12/16 153 lb (69.4 kg)  01/31/16 164 lb (74.4 kg)   She has noted a sore in her right nostril- she has been treated for staph infection of her nostrils in the past.  Not MRSA however per her knowledge. The sore seems to have come back and is also in her left nostril.  No fever, chills, or other systemic sx  Patient Active Problem List   Diagnosis Date Noted  . Pre-diabetes 06/05/2015  . DJD (degenerative joint disease) of knee 02/18/2014  . Cataract extraction status of left  eye 01/16/2014  . Primary localized osteoarthritis of right knee   . Anxiety   . Chest pain, midsternal 07/23/2013  . Osteopenia 03/23/2013  . Kidney stones, calcium oxalate 03/23/2013  . Hypertension 03/31/2011  . Hyperlipidemia 03/31/2011  . Vitamin D deficiency 03/31/2011  . Hyperglycemia 03/31/2011  . Hallux rigidus 03/24/2011  . Metatarsalgia of left foot 03/24/2011  . Lateral epicondylitis of left elbow 02/24/2011  . Migraine 02/24/2011  . Depression 02/24/2011  . Cataract extraction status of right eye 02/24/2011  . Arthritis 02/24/2011    Past Medical History:  Diagnosis Date  . Anxiety    takes Ativan nightly  . Depression    takes Zoloft daily  . History of colon polyps   . History of kidney stones   . History of staph infection 11/2013  . Hyperlipidemia    takes Crestor nightly   . Hypertension    takes Lisinopril and Coreg daily  . Joint pain   . Joint swelling   . Neuropathy    takes Elavil nightly  . Pneumonia   . Primary localized osteoarthritis of right knee     Past Surgical History:  Procedure Laterality Date  . ABDOMINAL HYSTERECTOMY  1989  . APPENDECTOMY    . CARDIAC CATHETERIZATION  2015  . CERVICAL SPINE  SURGERY  2004   C 5-6  . COLONOSCOPY    . colonscopy    . DIAGNOSTIC LAPAROSCOPY  1976   fibroid removed  . ELBOW ARTHROSCOPY  2010, 2012  . EYE SURGERY Bilateral    cataract  . FOOT SURGERY Left 2013  . KNEE ARTHROSCOPY  Oct 2014  . KNEE ARTHROSCOPY W/ MENISCECTOMY Right 01/31/2013   [facility]  . LATERAL EPICONDYLE RELEASE  03/01/2011   Procedure: TENNIS ELBOW RELEASE;  Surgeon: Lorn Junes, MD;  Location: [place];  Service: Orthopedics;  Laterality: Left;  TENOTOMY ELBOW LATERAL EPICONDYLITIS TENNIS ELBOW  . lateral release right elbow  2010  . left foot surgery  2013   mortons neuroma  . LEFT HEART CATHETERIZATION WITH CORONARY ANGIOGRAM N/A 07/23/2013   Procedure: LEFT HEART CATHETERIZATION WITH CORONARY  ANGIOGRAM;  Surgeon: Sinclair Grooms, MD;  Location[facility] CATH LAB;  Service: Cardiovascular;  Laterality: N/A;  . PAROTID GLAND TUMOR EXCISION  1976   benign  . PAROTID GLAND TUMOR EXCISION  1976   rt  . SHOULDER ARTHROSCOPY DISTAL CLAVICLE EXCISION AND OPEN ROTATOR CUFF REPAIR  2006  . SHOULDER ARTHROSCOPY W/ ROTATOR CUFF REPAIR  2006  . TOTAL KNEE ARTHROPLASTY Right 02/18/2014   Procedure: TOTAL KNEE ARTHROPLASTY;  Surgeon: Lorn Junes, MD;  Location: [place];  Service: Orthopedics;  Laterality: Right;    Social History  Substance Use Topics  . Smoking status: Never Smoker  . Smokeless tobacco: Never Used  . Alcohol use Yes     Comment: 1-2 drinks weekly    Family History  Problem Relation Age of Onset  . Cancer Mother     lung  . Cancer Father     lung; + tobacco  . Heart disease Maternal Grandfather     heart attack  . Heart disease Paternal Grandfather     heart attack  . Diabetes Sister   . Hypertension Sister   . Hyperlipidemia Sister     Allergies  Allergen Reactions  . Penicillins Swelling and Rash    Medication list has been reviewed and updated.  Current Outpatient Prescriptions on File Prior to Visit  Medication Sig Dispense Refill  . amitriptyline (ELAVIL) 25 MG tablet Take 1 tablet (25 mg total) by mouth at bedtime. 90 tablet 3  . carvedilol (COREG) 3.125 MG tablet Take 1 tablet (3.125 mg total) by mouth 2 (two) times daily with a meal. 60 tablet 0  . Estradiol 10 MCG TABS vaginal tablet Use one PV daily for 2 weeks- then use twice a week as needed 30 tablet 3  . ibandronate (BONIVA) 150 MG tablet Take 1 monthly in the morning with a glass of water, empty stomach, nothing anything else by mouth or lie down for the next 30 min. 3 tablet 4  . lisinopril (PRINIVIL,ZESTRIL) 20 MG tablet TAKE 1 TABLET DAILY. 90 tablet 0  . nitroGLYCERIN (NITROSTAT) 0.4 MG SL tablet Place 1 tablet (0.4 mg total) under the tongue every 5 (five) minutes as needed for chest  pain. 14 tablet 0  . rosuvastatin (CRESTOR) 10 MG tablet TAKE 1 TABLET ONCE DAILY. 30 tablet 0  . sertraline (ZOLOFT) 100 MG tablet TAKE 2 & 1/2 TABLETS DAILY. 75 tablet 0  . traZODone (DESYREL) 50 MG tablet Take 1-2 tablets (50-100 mg total) by mouth at bedtime. 180 tablet 3   No current facility-administered medications on file prior to visit.     Review of Systems: As per HPI-  otherwise negative.  No fever, chills, nausea, vomitng, diarrhea, rash  Physical Examination: Vitals:   08/18/16 0915  BP: 140/82  Pulse: 61  Temp: 98.1 F (36.7 C)   Vitals:   08/18/16 0915  Weight: 153 lb 9.6 oz (69.7 kg)  Height: 5\' 5"  (1.651 m)   Body mass index is 25.56 kg/m. Ideal Body Weight: Weight in (lb) to have BMI = 25: 149.9  GEN: WDWN, NAD, Non-toxic, A & O x 3, normal weight, looks well HEENT: Atraumatic, Normocephalic. Neck supple. No masses, No LAD.  Bilateral TM wnl, oropharynx normal.  PEERL,EOMI.  She has evidence of a mild infection in the right anterior nare Ears and Nose: No external deformity. CV: RRR, No M/G/R. No JVD. No thrill. No extra heart sounds. PULM: CTA B, no wheezes, crackles, rhonchi. No retractions. No resp. distress. No accessory muscle use. ABD: S, NT, ND, +BS. No rebound. No HSM. EXTR: No c/c/e NEURO Normal gait.  PSYCH: Normally interactive. Conversant. Not depressed or anxious appearing.  Calm demeanor.  No LE edema, but she does have spider veins on both shins   Assessment and Plan: Spider veins  Nasal sore - Plan: mupirocin ointment (BACTROBAN) 2 %  Pre-diabetes  Lab Results  Component Value Date   HGBA1C 5.7 (H) 08/12/2016   Recent A1c better- she will continue her healthy lifestyle changes  It was great to see you today- congrats on weight loss!   Your A1c (average blood sugar) is better!  Try the mupirocin ointment in your nose twice a day for 5 days If you wish to have your veins treated, I would recommend that you contact the [facility] in [place] Address: [address], [place],  [address]   Phone: [phone]  Take care and please see me in about 6 months to check on how you are doing   Signed Lamar Blinks, MD

## 2016-08-24 ENCOUNTER — Other Ambulatory Visit: Payer: Self-pay | Admitting: Family Medicine

## 2016-08-24 DIAGNOSIS — E785 Hyperlipidemia, unspecified: Secondary | ICD-10-CM

## 2016-08-25 ENCOUNTER — Other Ambulatory Visit: Payer: Self-pay | Admitting: Family Medicine

## 2016-08-25 DIAGNOSIS — I10 Essential (primary) hypertension: Secondary | ICD-10-CM

## 2016-10-03 ENCOUNTER — Other Ambulatory Visit: Payer: Self-pay | Admitting: Family Medicine

## 2016-10-03 DIAGNOSIS — E785 Hyperlipidemia, unspecified: Secondary | ICD-10-CM

## 2016-10-05 DIAGNOSIS — Z961 Presence of intraocular lens: Secondary | ICD-10-CM | POA: Diagnosis not present

## 2016-10-05 DIAGNOSIS — H04563 Stenosis of bilateral lacrimal punctum: Secondary | ICD-10-CM | POA: Diagnosis not present

## 2016-10-12 ENCOUNTER — Telehealth: Payer: Self-pay | Admitting: Family Medicine

## 2016-10-12 NOTE — Telephone Encounter (Signed)
Tyler with [facility] called to check status of faxed request to change script Rosuvastatin from 30 day supply to 90 day supply. Verified fax# he will fax request again. Please change script to 90 days for pt.

## 2016-10-13 ENCOUNTER — Other Ambulatory Visit: Payer: Self-pay | Admitting: Emergency Medicine

## 2016-10-13 DIAGNOSIS — E785 Hyperlipidemia, unspecified: Secondary | ICD-10-CM

## 2016-10-13 MED ORDER — ROSUVASTATIN CALCIUM 10 MG PO TABS: 10.0000 mg | ORAL_TABLET | Freq: Every day | ORAL | 1 refills | Status: DC

## 2016-10-13 NOTE — Telephone Encounter (Signed)
Rosuvastatin changed from 30 day to 90 day supply as requested.

## 2016-11-02 DIAGNOSIS — L570 Actinic keratosis: Secondary | ICD-10-CM | POA: Diagnosis not present

## 2016-11-25 ENCOUNTER — Other Ambulatory Visit: Payer: Self-pay | Admitting: Family Medicine

## 2016-11-25 DIAGNOSIS — I10 Essential (primary) hypertension: Secondary | ICD-10-CM

## 2016-11-26 NOTE — Telephone Encounter (Signed)
Rx sent to pharmacy. LB 

## 2016-12-13 ENCOUNTER — Other Ambulatory Visit: Payer: Self-pay | Admitting: Family Medicine

## 2016-12-13 DIAGNOSIS — I10 Essential (primary) hypertension: Secondary | ICD-10-CM

## 2017-02-17 ENCOUNTER — Other Ambulatory Visit: Payer: Self-pay | Admitting: Family Medicine

## 2017-02-17 DIAGNOSIS — G609 Hereditary and idiopathic neuropathy, unspecified: Secondary | ICD-10-CM

## 2017-02-17 DIAGNOSIS — I10 Essential (primary) hypertension: Secondary | ICD-10-CM

## 2017-03-06 NOTE — Progress Notes (Signed)
[place] at [facility] [address], [place], [place] [address] [phone]  Date:  03/07/2017   Name:  Monica Kane   DOB:  1945/07/17   MRN:  [id number]  PCP:  Darreld Mclean, MD    Chief Complaint: Shortness of Breath (c/o SOB on exertion,  fatigue, occ chest tightness x 6 weeks.)   History of Present Illness:  Monica Kane is a 71 y.o. very pleasant female patient who presents with the following:  History of pre-diabetes, HTN, hyperlipidemia, vitamin D deficiency.  Last seen here in May- partial HPI from that visit:  Her A1c has improved through weight loss- she has lost approx 20 lbs through diet and exercise changes and is very pleased with her progress  she does have a history of pre-diabetes.  Her A1c was checked at [facility] recently and looked better  Lab Results  Component Value Date   HGBA1C 5.7 (H) 08/12/2016   Here today with complaint of SOB-  She notes that she is feeling sleepy/ tired all the time She has noted these sx for about 6 weeks.   She denies CP, but notes that she will feel "like someone punched me in the chest."   She notes this the most when she is sitting up in her bed at the end of the day  She is dancing for exercise- she will get SOB with exercise but she does feel like it is more than it should be  She notes that her memory has not been quite as good recently either Her husband had history of CABG so he got worried about her and wanted her to come in Never a smoker CXR from a year ago was benign  Back in 2015 she was having jaw pain, nausea- ended up getting admitted and had a cardiac cath which was normal.   She had an echo in 2015 as well- normal  No history of blood clot. She may even feel SOB when she is at rest She did fly to [place] and back this past week- however no longer trips No hormone use No cough or fever No calf pain or swelling  BP Readings from Last 3 Encounters:   03/07/17 (!) 148/70  08/18/16 140/82  08/12/16 130/68    Patient Active Problem List   Diagnosis Date Noted  . Pre-diabetes 06/05/2015  . Primary localized osteoarthritis of right knee   . Anxiety   . Chest pain, midsternal 07/23/2013  . Osteopenia 03/23/2013  . Kidney stones, calcium oxalate 03/23/2013  . Hypertension 03/31/2011  . Hyperlipidemia 03/31/2011  . Vitamin D deficiency 03/31/2011  . Hyperglycemia 03/31/2011  . Hallux rigidus 03/24/2011  . Metatarsalgia of left foot 03/24/2011  . Lateral epicondylitis of left elbow 02/24/2011  . Migraine 02/24/2011  . Depression 02/24/2011  . Arthritis 02/24/2011    Past Medical History:  Diagnosis Date  . Anxiety    takes Ativan nightly  . Depression    takes Zoloft daily  . History of colon polyps   . History of kidney stones   . History of staph infection 11/2013  . Hyperlipidemia    takes Crestor nightly   . Hypertension    takes Lisinopril and Coreg daily  . Joint pain   . Joint swelling   . Neuropathy    takes Elavil nightly  . Pneumonia   . Primary localized osteoarthritis of right knee     Past Surgical  History:  Procedure Laterality Date  . ABDOMINAL HYSTERECTOMY  1989  . APPENDECTOMY    . CARDIAC CATHETERIZATION  2015  . CERVICAL SPINE SURGERY  2004   C 5-6  . COLONOSCOPY    . colonscopy    . DIAGNOSTIC LAPAROSCOPY  1976   fibroid removed  . ELBOW ARTHROSCOPY  2010, 2012  . EYE SURGERY Bilateral    cataract  . FOOT SURGERY Left 2013  . KNEE ARTHROSCOPY  Oct 2014  . KNEE ARTHROSCOPY W/ MENISCECTOMY Right 01/31/2013   [facility]  . lateral release right elbow  2010  . left foot surgery  2013   mortons neuroma  . LEFT HEART CATHETERIZATION WITH CORONARY ANGIOGRAM N/A 07/23/2013   Performed by Belva Crome, MD at [facility] CATH LAB  . PAROTID GLAND TUMOR EXCISION  1976   benign  . PAROTID GLAND TUMOR EXCISION  1976   rt  . SHOULDER ARTHROSCOPY DISTAL CLAVICLE EXCISION AND OPEN ROTATOR CUFF REPAIR  2006   . SHOULDER ARTHROSCOPY W/ ROTATOR CUFF REPAIR  2006  . TENNIS ELBOW RELEASE/NIRSCHEL PROCEDURE Left 03/01/2011   Performed by Lorn Junes, MD at [facility]  . TOTAL KNEE ARTHROPLASTY Right 02/18/2014   Performed by Lorn Junes, MD at [place] History   Tobacco Use  . Smoking status: Never Smoker  . Smokeless tobacco: Never Used  Substance Use Topics  . Alcohol use: Yes    Comment: 1-2 drinks weekly  . Drug use: No    Family History  Problem Relation Age of Onset  . Cancer Mother        lung  . Cancer Father        lung; + tobacco  . Heart disease Maternal Grandfather        heart attack  . Heart disease Paternal Grandfather        heart attack  . Diabetes Sister   . Hypertension Sister   . Hyperlipidemia Sister     Allergies  Allergen Reactions  . Penicillins Swelling and Rash    Medication list has been reviewed and updated.  Current Outpatient Medications on File Prior to Visit  Medication Sig Dispense Refill  . amitriptyline (ELAVIL) 25 MG tablet TAKE ONE TABLET AT BEDTIME. 90 tablet 0  . carvedilol (COREG) 3.125 MG tablet TAKE 1 TABLET TWICE DAILY WITH A MEAL. 60 tablet 0  . ibandronate (BONIVA) 150 MG tablet Take 1 monthly in the morning with a glass of water, empty stomach, nothing anything else by mouth or lie down for the next 30 min. 3 tablet 4  . lisinopril (PRINIVIL,ZESTRIL) 20 MG tablet Take 1 tablet (20 mg total) by mouth daily. 90 tablet 1  . nitroGLYCERIN (NITROSTAT) 0.4 MG SL tablet Place 1 tablet (0.4 mg total) under the tongue every 5 (five) minutes as needed for chest pain. 14 tablet 0  . rosuvastatin (CRESTOR) 10 MG tablet Take 1 tablet (10 mg total) by mouth daily. 90 tablet 1  . sertraline (ZOLOFT) 100 MG tablet TAKE 2 & 1/2 TABLETS DAILY. 75 tablet 0  . traZODone (DESYREL) 50 MG tablet Take 1-2 tablets (50-100 mg total) by mouth at bedtime. 180 tablet 3   No current facility-administered medications on file  prior to visit.     Review of Systems:  As per HPI- otherwise negative.   Physical Examination: Vitals:   03/07/17 0904  BP: (!) 158/78  Pulse: (!) 58  Temp: 98 F (36.7  C)  SpO2: 98%   Vitals:   03/07/17 0904  Weight: 160 lb 9.6 oz (72.8 kg)  Height: 5' 5.75" (1.67 m)   Body mass index is 26.12 kg/m. Ideal Body Weight: Weight in (lb) to have BMI = 25: 153.4  GEN: WDWN, NAD, Non-toxic, A & O x 3, looks well, mild overweight HEENT: Atraumatic, Normocephalic. Neck supple. No masses, No LAD.  Bilateral TM wnl, oropharynx normal.  PEERL,EOMI.   Ears and Nose: No external deformity. CV: RRR, No M/G/R. No JVD. No thrill. No extra heart sounds. PULM: CTA B, no wheezes, crackles, rhonchi. No retractions. No resp. distress. No accessory muscle use. ABD: S, NT, ND, +BS. No rebound. No HSM. EXTR: No c/c/e NEURO Normal gait.  PSYCH: Normally interactive. Conversant. Not depressed or anxious appearing.  Calm demeanor.  No calf pain or swelling   EKG: NSR  Compared to 2015 no significnat change Assessment and Plan: SOB (shortness of breath) - Plan: EKG 12-Lead, Exercise Tolerance Test, albuterol (PROVENTIL HFA;VENTOLIN HFA) 108 (90 Base) MCG/ACT inhaler, Troponin I, D-Dimer, Quantitative, DG Chest 2 View, Basic metabolic panel, CBC, TSH  Benign essential HTN - Plan: TSH  Here today with SOB that she has noted for a few weeks Need to eval further for cardiac disease- will get a troponin today, and set her up for an exercise test Also discussed PE- she would like to do a D dimer, she is aware of need for CT angiogram if positive Will also obtain a CXR today  Received her labs and x-ray report  Dg Chest 2 View  Result Date: 03/07/2017 CLINICAL DATA:  Shortness of breath for 6 weeks. EXAM: CHEST  2 VIEW COMPARISON:  01/22/2016 FINDINGS: The cardiopericardial silhouette is within normal limits for size. Calcified lymph nodes identified in the mediastinum. The lungs are clear  without focal pneumonia, edema, pneumothorax or pleural effusion. The visualized bony structures of the thorax are intact. IMPRESSION: Stable.  No acute findings. Electronically Signed   By: Misty Stanley M.D.   On: 03/07/2017 10:02   Great news, all is normal.  Message to pt  Results for orders placed or performed in visit on 03/07/17  Troponin I  Result Value Ref Range   TNIDX 0.00 0.00 - 0.06 ug/l  D-Dimer, Quantitative  Result Value Ref Range   D-Dimer, Quant 0.34 <0.50 mcg/mL FEU  Basic metabolic panel  Result Value Ref Range   Sodium 141 135 - 145 mEq/L   Potassium 3.8 3.5 - 5.1 mEq/L   Chloride 107 96 - 112 mEq/L   CO2 29 19 - 32 mEq/L   Glucose, Bld 116 (H) 70 - 99 mg/dL   BUN 13 6 - 23 mg/dL   Creatinine, Ser 0.77 0.40 - 1.20 mg/dL   Calcium 9.1 8.4 - 10.5 mg/dL   GFR 78.93 >60.00 mL/min  CBC  Result Value Ref Range   WBC 4.9 4.0 - 10.5 K/uL   RBC 4.98 3.87 - 5.11 Mil/uL   Platelets 170.0 150.0 - 400.0 K/uL   Hemoglobin 13.0 12.0 - 15.0 g/dL   HCT 40.6 36.0 - 46.0 %   MCV 81.4 78.0 - 100.0 fl   MCHC 32.0 30.0 - 36.0 g/dL   RDW 13.2 11.5 - 15.5 %  TSH  Result Value Ref Range   TSH 1.98 0.35 - 4.50 uIU/mL     Signed Lamar Blinks, MD

## 2017-03-07 ENCOUNTER — Ambulatory Visit (HOSPITAL_BASED_OUTPATIENT_CLINIC_OR_DEPARTMENT_OTHER)
Admission: RE | Admit: 2017-03-07 | Discharge: 2017-03-07 | Disposition: A | Discharge status: Home / Self Care | Payer: Medicare Other | Source: Ambulatory Visit | Attending: Family Medicine | Admitting: Family Medicine

## 2017-03-07 ENCOUNTER — Encounter: Payer: Self-pay | Admitting: Family Medicine

## 2017-03-07 ENCOUNTER — Ambulatory Visit (INDEPENDENT_AMBULATORY_CARE_PROVIDER_SITE_OTHER): Payer: Medicare Other | Admitting: Family Medicine

## 2017-03-07 VITALS — BP 148/70 | HR 58 | Temp 98.0°F | Ht 65.75 in | Wt 160.6 lb

## 2017-03-07 DIAGNOSIS — I10 Essential (primary) hypertension: Secondary | ICD-10-CM | POA: Diagnosis not present

## 2017-03-07 DIAGNOSIS — R0602 Shortness of breath: Secondary | ICD-10-CM | POA: Diagnosis not present

## 2017-03-07 LAB — BASIC METABOLIC PANEL
BUN: 13 mg/dL (ref 6–23)
CALCIUM: 9.1 mg/dL (ref 8.4–10.5)
CO2: 29 mEq/L (ref 19–32)
Chloride: 107 mEq/L (ref 96–112)
Creatinine, Ser: 0.77 mg/dL (ref 0.40–1.20)
GFR: 78.93 mL/min (ref >60.00)
Glucose, Bld: 116 mg/dL — ABNORMAL HIGH (ref 70–99)
POTASSIUM: 3.8 meq/L (ref 3.5–5.1)
SODIUM: 141 meq/L (ref 135–145)

## 2017-03-07 LAB — CBC
HCT: 40.6 % (ref 36.0–46.0)
HEMOGLOBIN: 13 g/dL (ref 12.0–15.0)
MCHC: 32 g/dL (ref 30.0–36.0)
MCV: 81.4 fl (ref 78.0–100.0)
PLATELETS: 170 10*3/uL (ref 150.0–400.0)
RBC: 4.98 Mil/uL (ref 3.87–5.11)
RDW: 13.2 % (ref 11.5–15.5)
WBC: 4.9 10*3/uL (ref 4.0–10.5)

## 2017-03-07 LAB — TSH: TSH: 1.98 u[IU]/mL (ref 0.35–4.50)

## 2017-03-07 LAB — D-DIMER, QUANTITATIVE: D-Dimer, Quant: 0.34 mcg/mL FEU (ref <0.50)

## 2017-03-07 LAB — TROPONIN I: TNIDX: 0 ug/l (ref 0.00–0.06)

## 2017-03-07 MED ORDER — ALBUTEROL SULFATE HFA 108 (90 BASE) MCG/ACT IN AERS: 2.0000 | INHALATION_SPRAY | Freq: Four times a day (QID) | RESPIRATORY_TRACT | 0 refills | Status: DC | PRN

## 2017-03-07 NOTE — Patient Instructions (Signed)
It was good to see you today!  Please go to the ground floor imaging suite for your chest x-ray- then you can head home. I will be in touch with your labs and x-ray report asap We will set you up for a treadmill test of your heart If any change or worsening of your symptoms please seek care right away Take a baby aspirin daily under we get your treadmill results back

## 2017-04-22 ENCOUNTER — Other Ambulatory Visit: Payer: Self-pay | Admitting: Family Medicine

## 2017-04-22 DIAGNOSIS — G4700 Insomnia, unspecified: Secondary | ICD-10-CM

## 2017-04-22 NOTE — Telephone Encounter (Signed)
Refill request for traZODone (DESYREL)  OV: 03/07/17 RF: 01/27/16  Please advise.

## 2017-05-02 ENCOUNTER — Other Ambulatory Visit: Payer: Self-pay | Admitting: Family Medicine

## 2017-05-02 DIAGNOSIS — F321 Major depressive disorder, single episode, moderate: Secondary | ICD-10-CM

## 2017-05-02 DIAGNOSIS — I10 Essential (primary) hypertension: Secondary | ICD-10-CM

## 2017-05-05 NOTE — Telephone Encounter (Signed)
Refill request for sertraline (ZOLOFT) 100 MG tablet.   There is a DRUG-DRUG: SERTRALINE AND AMITRIPTYLINE warning. Is it ok to refill? Please advise.

## 2017-05-12 ENCOUNTER — Other Ambulatory Visit: Payer: Self-pay | Admitting: Family Medicine

## 2017-05-12 DIAGNOSIS — G609 Hereditary and idiopathic neuropathy, unspecified: Secondary | ICD-10-CM

## 2017-06-08 ENCOUNTER — Other Ambulatory Visit: Payer: Self-pay | Admitting: Family Medicine

## 2017-06-08 DIAGNOSIS — E785 Hyperlipidemia, unspecified: Secondary | ICD-10-CM

## 2017-07-13 ENCOUNTER — Encounter: Payer: Self-pay | Admitting: Family Medicine

## 2017-07-14 ENCOUNTER — Encounter: Payer: Self-pay | Admitting: Family

## 2017-07-14 ENCOUNTER — Ambulatory Visit (INDEPENDENT_AMBULATORY_CARE_PROVIDER_SITE_OTHER): Payer: Medicare Other | Admitting: Family

## 2017-07-14 VITALS — BP 133/56 | HR 73 | Temp 98.5°F | Resp 16 | Ht 65.7 in | Wt 161.6 lb

## 2017-07-14 DIAGNOSIS — N390 Urinary tract infection, site not specified: Secondary | ICD-10-CM

## 2017-07-14 DIAGNOSIS — R300 Dysuria: Secondary | ICD-10-CM

## 2017-07-14 LAB — POC URINALSYSI DIPSTICK (AUTOMATED)
Bilirubin, UA: NEGATIVE
Blood, UA: NEGATIVE
GLUCOSE UA: NEGATIVE
Ketones, UA: NEGATIVE
Nitrite, UA: NEGATIVE
Spec Grav, UA: >=1.03 — AB (ref 1.010–1.025)
UROBILINOGEN UA: 0.2 U/dL
pH, UA: 6 (ref 5.0–8.0)

## 2017-07-14 MED ORDER — CIPROFLOXACIN HCL 500 MG PO TABS: 500.0000 mg | ORAL_TABLET | Freq: Two times a day (BID) | ORAL | 0 refills | Status: DC

## 2017-07-14 NOTE — Progress Notes (Signed)
Subjective:    Patient ID: Monica Kane, female    DOB: Jul 14, 1945, 72 y.o.   MRN: [id number]  HPI  Ms. Like is a 72 yr old female who presents with chief complaint of dysuria and frequency of urination. Symptoms began about 6 days ago. She denies associated fever, nausea/vomiting, flank pain or hematuria.  She reports that she has a uti every few years.   Lab Results  Component Value Date   CREATININE 0.77 03/07/2017    Review of Systems See HPI  Past Medical History:  Diagnosis Date  . Anxiety    takes Ativan nightly  . Depression    takes Zoloft daily  . History of colon polyps   . History of kidney stones   . History of staph infection 11/2013  . Hyperlipidemia    takes Crestor nightly   . Hypertension    takes Lisinopril and Coreg daily  . Joint pain   . Joint swelling   . Neuropathy    takes Elavil nightly  . Pneumonia   . Primary localized osteoarthritis of right knee      Social History   Socioeconomic History  . Marital status: Married    Spouse name: Cerissa Zeiger  . Number of children: Not on file  . Years of education: Not on file  . Highest education level: Not on file  Occupational History  . Not on file  Social Needs  . Financial resource strain: Not on file  . Food insecurity:    Worry: Not on file    Inability: Not on file  . Transportation needs:    Medical: Not on file    Non-medical: Not on file  Tobacco Use  . Smoking status: Never Smoker  . Smokeless tobacco: Never Used  Substance and Sexual Activity  . Alcohol use: Yes    Comment: 1-2 drinks weekly  . Drug use: No  . Sexual activity: Yes    Birth control/protection: Surgical  Lifestyle  . Physical activity:    Days per week: Not on file    Minutes per session: Not on file  . Stress: Not on file  Relationships  . Social connections:    Talks on phone: Not on file    Gets together: Not on file    Attends religious service: Not on file    Active member of club or  organization: Not on file    Attends meetings of clubs or organizations: Not on file    Relationship status: Not on file  . Intimate partner violence:    Fear of current or ex partner: Not on file    Emotionally abused: Not on file    Physically abused: Not on file    Forced sexual activity: Not on file  Other Topics Concern  . Not on file  Social History Narrative   Lives with her husband.  He has 2 adult children. Patient has a Financial risk analyst, and she does exercise.    Past Surgical History:  Procedure Laterality Date  . ABDOMINAL HYSTERECTOMY  1989  . APPENDECTOMY    . CARDIAC CATHETERIZATION  2015  . CERVICAL SPINE SURGERY  2004   C 5-6  . COLONOSCOPY    . colonscopy    . DIAGNOSTIC LAPAROSCOPY  1976   fibroid removed  . ELBOW ARTHROSCOPY  2010, 2012  . EYE SURGERY Bilateral    cataract  . FOOT SURGERY Left 2013  . KNEE ARTHROSCOPY  Oct 2014  . KNEE  ARTHROSCOPY W/ MENISCECTOMY Right 01/31/2013   [facility]  . LATERAL EPICONDYLE RELEASE  03/01/2011   Procedure: TENNIS ELBOW RELEASE;  Surgeon: Lorn Junes, MD;  Location: [place];  Service: Orthopedics;  Laterality: Left;  TENOTOMY ELBOW LATERAL EPICONDYLITIS TENNIS ELBOW  . lateral release right elbow  2010  . left foot surgery  2013   mortons neuroma  . LEFT HEART CATHETERIZATION WITH CORONARY ANGIOGRAM N/A 07/23/2013   Procedure: LEFT HEART CATHETERIZATION WITH CORONARY ANGIOGRAM;  Surgeon: Sinclair Grooms, MD;  Location: [facility] CATH LAB;  Service: Cardiovascular;  Laterality: N/A;  . PAROTID GLAND TUMOR EXCISION  1976   benign  . PAROTID GLAND TUMOR EXCISION  1976   rt  . SHOULDER ARTHROSCOPY DISTAL CLAVICLE EXCISION AND OPEN ROTATOR CUFF REPAIR  2006  . SHOULDER ARTHROSCOPY W/ ROTATOR CUFF REPAIR  2006  . TOTAL KNEE ARTHROPLASTY Right 02/18/2014   Procedure: TOTAL KNEE ARTHROPLASTY;  Surgeon: Lorn Junes, MD;  Location: [place];  Service: Orthopedics;  Laterality: Right;    Family History    Problem Relation Age of Onset  . Cancer Mother        lung  . Cancer Father        lung; + tobacco  . Heart disease Maternal Grandfather        heart attack  . Heart disease Paternal Grandfather        heart attack  . Diabetes Sister   . Hypertension Sister   . Hyperlipidemia Sister     Allergies  Allergen Reactions  . Penicillins Swelling and Rash    Current Outpatient Medications on File Prior to Visit  Medication Sig Dispense Refill  . albuterol (PROVENTIL HFA;VENTOLIN HFA) 108 (90 Base) MCG/ACT inhaler Inhale 2 puffs every 6 (six) hours as needed into the lungs for wheezing or shortness of breath. 1 Inhaler 0  . amitriptyline (ELAVIL) 25 MG tablet TAKE ONE TABLET AT BEDTIME. 90 tablet 0  . carvedilol (COREG) 3.125 MG tablet TAKE 1 TABLET BY MOUTH TWICE DAILY WITH A MEAL. 60 tablet 0  . ibandronate (BONIVA) 150 MG tablet Take 1 monthly in the morning with a glass of water, empty stomach, nothing anything else by mouth or lie down for the next 30 min. 3 tablet 4  . lisinopril (PRINIVIL,ZESTRIL) 20 MG tablet Take 1 tablet (20 mg total) by mouth daily. 90 tablet 1  . nitroGLYCERIN (NITROSTAT) 0.4 MG SL tablet Place 1 tablet (0.4 mg total) under the tongue every 5 (five) minutes as needed for chest pain. 14 tablet 0  . rosuvastatin (CRESTOR) 10 MG tablet TAKE 1 TABLET ONCE DAILY. 90 tablet 0  . sertraline (ZOLOFT) 100 MG tablet TAKE 2 & 1/2 TABLETS DAILY. 75 tablet 11  . traZODone (DESYREL) 50 MG tablet TAKE 1 OR 2 TABLETS AT BEDTIME. 180 tablet 0   No current facility-administered medications on file prior to visit.     BP (!) 133/56 (BP Location: Right Arm, Patient Position: Sitting, Cuff Size: Small)   Pulse 73   Temp 98.5 F (36.9 C) (Oral)   Resp 16   Ht 5' 5.7" (1.669 m)   Wt 161 lb 9.6 oz (73.3 kg)   SpO2 98%   BMI 26.32 kg/m       Objective:   Physical Exam  Constitutional: She appears well-developed and well-nourished.  Cardiovascular: Normal rate, regular  rhythm and normal heart sounds.  No murmur heard. Pulmonary/Chest: Effort normal and breath sounds normal. No respiratory  distress. She has no wheezes.  Abdominal: Soft. She exhibits no distension. There is no CVA tenderness.  Mild suprapubic tenderness without guarding  Psychiatric: She has a normal mood and affect. Her behavior is normal. Judgment and thought content normal.          Assessment & Plan:  UTI- UA notes trace leukocytes and trace protein.  Will send urine for culture and begin cipro.  (has pen allergy). She is advised to call if new/worsening symptoms or if symptoms are not improved in 2-3 days.

## 2017-07-14 NOTE — Patient Instructions (Signed)
Please start cipro for UTI. Call if new/worsening symptoms or if symptoms are not improved in 2-3 days.

## 2017-07-15 LAB — URINE CULTURE
MICRO NUMBER:: 90388142
SPECIMEN QUALITY:: ADEQUATE

## 2017-07-19 ENCOUNTER — Other Ambulatory Visit: Payer: Self-pay | Admitting: Family Medicine

## 2017-07-19 DIAGNOSIS — I10 Essential (primary) hypertension: Secondary | ICD-10-CM

## 2017-07-20 NOTE — Telephone Encounter (Signed)
Received refill request for carvedilol (COREG) 3.125 MG tablet.  Last office visit 03/07/17 and refill 05/02/17.

## 2017-08-10 NOTE — Progress Notes (Deleted)
Subjective:   Monica Kane is a 72 y.o. female who presents for Medicare Annual (Subsequent) preventive examination.  Review of Systems: No ROS.  Medicare Wellness Visit. Additional risk factors are reflected in the social history.    Sleep patterns: Home Safety/Smoke Alarms: Feels safe in home. Smoke alarms in place.  Living environment; residence and Firearm Safety:    Female:   Pap- hysterectomy      Mammo-       Dexa scan-        CCS-2015  Objective:     Vitals: There were no vitals taken for this visit.  There is no height or weight on file to calculate BMI.  Advanced Directives 08/12/2016 02/08/2014 01/17/2014 07/23/2013 02/26/2011  Does Patient Have a Medical Advance Directive? Yes Yes Yes Patient does not have advance directive Patient has advance directive, copy not in chart  Type of Advance Directive [place];Living will [place];Living will [place];Living will - -  Does patient want to make changes to medical advance directive? - No - Patient declined - - -  Copy of [place] in Chart? No - copy requested No - copy requested No - copy requested - -  Pre-existing out of facility DNR order (yellow form or pink MOST form) - - - No -    Tobacco Social History   Tobacco Use  Smoking Status Never Smoker  Smokeless Tobacco Never Used     Counseling given: Not Answered   Clinical Intake:                       Past Medical History:  Diagnosis Date  . Anxiety    takes Ativan nightly  . Depression    takes Zoloft daily  . History of colon polyps   . History of kidney stones   . History of staph infection 11/2013  . Hyperlipidemia    takes Crestor nightly   . Hypertension    takes Lisinopril and Coreg daily  . Joint pain   . Joint swelling   . Neuropathy    takes Elavil nightly  . Pneumonia   . Primary localized osteoarthritis of right knee    Past Surgical  History:  Procedure Laterality Date  . ABDOMINAL HYSTERECTOMY  1989  . APPENDECTOMY    . CARDIAC CATHETERIZATION  2015  . CERVICAL SPINE SURGERY  2004   C 5-6  . COLONOSCOPY    . colonscopy    . DIAGNOSTIC LAPAROSCOPY  1976   fibroid removed  . ELBOW ARTHROSCOPY  2010, 2012  . EYE SURGERY Bilateral    cataract  . FOOT SURGERY Left 2013  . KNEE ARTHROSCOPY  Oct 2014  . KNEE ARTHROSCOPY W/ MENISCECTOMY Right 01/31/2013   Noemi Chapel  . LATERAL EPICONDYLE RELEASE  03/01/2011   Procedure: TENNIS ELBOW RELEASE;  Surgeon: Lorn Junes, MD;  Location: [place];  Service: Orthopedics;  Laterality: Left;  TENOTOMY ELBOW LATERAL EPICONDYLITIS TENNIS ELBOW  . lateral release right elbow  2010  . left foot surgery  2013   mortons neuroma  . LEFT HEART CATHETERIZATION WITH CORONARY ANGIOGRAM N/A 07/23/2013   Procedure: LEFT HEART CATHETERIZATION WITH CORONARY ANGIOGRAM;  Surgeon: Sinclair Grooms, MD;  Location: [facility] CATH LAB;  Service: Cardiovascular;  Laterality: N/A;  . PAROTID GLAND TUMOR EXCISION  1976   benign  . PAROTID GLAND TUMOR EXCISION  1976   rt  .  SHOULDER ARTHROSCOPY DISTAL CLAVICLE EXCISION AND OPEN ROTATOR CUFF REPAIR  2006  . SHOULDER ARTHROSCOPY W/ ROTATOR CUFF REPAIR  2006  . TOTAL KNEE ARTHROPLASTY Right 02/18/2014   Procedure: TOTAL KNEE ARTHROPLASTY;  Surgeon: Lorn Junes, MD;  Location: [place];  Service: Orthopedics;  Laterality: Right;   Family History  Problem Relation Age of Onset  . Cancer Mother        lung  . Cancer Father        lung; + tobacco  . Heart disease Maternal Grandfather        heart attack  . Heart disease Paternal Grandfather        heart attack  . Diabetes Sister   . Hypertension Sister   . Hyperlipidemia Sister    Social History   Socioeconomic History  . Marital status: Married    Spouse name: Whitley Patchen  . Number of children: Not on file  . Years of education: Not on file  . Highest education level: Not on file   Occupational History  . Not on file  Social Needs  . Financial resource strain: Not on file  . Food insecurity:    Worry: Not on file    Inability: Not on file  . Transportation needs:    Medical: Not on file    Non-medical: Not on file  Tobacco Use  . Smoking status: Never Smoker  . Smokeless tobacco: Never Used  Substance and Sexual Activity  . Alcohol use: Yes    Comment: 1-2 drinks weekly  . Drug use: No  . Sexual activity: Yes    Birth control/protection: Surgical  Lifestyle  . Physical activity:    Days per week: Not on file    Minutes per session: Not on file  . Stress: Not on file  Relationships  . Social connections:    Talks on phone: Not on file    Gets together: Not on file    Attends religious service: Not on file    Active member of club or organization: Not on file    Attends meetings of clubs or organizations: Not on file    Relationship status: Not on file  Other Topics Concern  . Not on file  Social History Narrative   Lives with her husband.  He has 2 adult children. Patient has a Financial risk analyst, and she does exercise.    Outpatient Encounter Medications as of 08/15/2017  Medication Sig  . albuterol (PROVENTIL HFA;VENTOLIN HFA) 108 (90 Base) MCG/ACT inhaler Inhale 2 puffs every 6 (six) hours as needed into the lungs for wheezing or shortness of breath.  Marland Kitchen amitriptyline (ELAVIL) 25 MG tablet TAKE ONE TABLET AT BEDTIME.  . carvedilol (COREG) 3.125 MG tablet TAKE 1 TABLET BY MOUTH TWICE DAILY WITH A MEAL.  . ciprofloxacin (CIPRO) 500 MG tablet Take 1 tablet (500 mg total) by mouth 2 (two) times daily.  Marland Kitchen ibandronate (BONIVA) 150 MG tablet Take 1 monthly in the morning with a glass of water, empty stomach, nothing anything else by mouth or lie down for the next 30 min.  Marland Kitchen lisinopril (PRINIVIL,ZESTRIL) 20 MG tablet Take 1 tablet (20 mg total) by mouth daily.  . nitroGLYCERIN (NITROSTAT) 0.4 MG SL tablet Place 1 tablet (0.4 mg total) under the tongue  every 5 (five) minutes as needed for chest pain.  . rosuvastatin (CRESTOR) 10 MG tablet TAKE 1 TABLET ONCE DAILY.  Marland Kitchen sertraline (ZOLOFT) 100 MG tablet TAKE 2 & 1/2 TABLETS DAILY.  Marland Kitchen  traZODone (DESYREL) 50 MG tablet TAKE 1 OR 2 TABLETS AT BEDTIME.   No facility-administered encounter medications on file as of 08/15/2017.     Activities of Daily Living In your present state of health, do you have any difficulty performing the following activities: 08/12/2016 08/12/2016  Hearing? N N  Vision? N -  Difficulty concentrating or making decisions? N -  Walking or climbing stairs? N -  Dressing or bathing? N -  Doing errands, shopping? N -  Preparing Food and eating ? N -  Using the Toilet? N -  In the past six months, have you accidently leaked urine? N -  Do you have problems with loss of bowel control? N -  Managing your Medications? N -  Managing your Finances? N -  Housekeeping or managing your Housekeeping? N -  Some recent data might be hidden    Patient Care Team: Copland, Gay Filler, MD as PCP - General (Family Medicine)    Assessment:   This is a routine wellness examination for Charo. Physical assessment deferred to PCP.   Exercise Activities and Dietary recommendations   Diet (meal preparation, eat out, water intake, caffeinated beverages, dairy products, fruits and vegetables): {Desc; diets:16563} Breakfast: Lunch:  Dinner:      Goals    None      Fall Risk Fall Risk  08/12/2016 09/10/2015 01/22/2015 04/10/2014  Falls in the past year? No No No No    Depression Screen PHQ 2/9 Scores 08/12/2016 09/10/2015 06/03/2015 01/22/2015  PHQ - 2 Score 0 0 0 0  Exception Documentation - Patient refusal - -     Cognitive Function        Immunization History  Administered Date(s) Administered  . Influenza,inj,Quad PF,6+ Mos 01/02/2014, 01/22/2015  . Pneumococcal Conjugate-13 01/22/2015  . Pneumococcal Polysaccharide-23 03/23/2013  . Td 02/18/2008     Screening  Tests Health Maintenance  Topic Date Due  . MAMMOGRAM  08/03/2017  . INFLUENZA VACCINE  11/17/2017  . TETANUS/TDAP  02/17/2018  . COLONOSCOPY  06/06/2018  . DEXA SCAN  Completed  . Hepatitis C Screening  Completed  . PNA vac Low Risk Adult  Completed       Plan:   ***   I have personally reviewed and noted the following in the patient's chart:   . Medical and social history . Use of alcohol, tobacco or illicit drugs  . Current medications and supplements . Functional ability and status . Nutritional status . Physical activity . Advanced directives . List of other physicians . Hospitalizations, surgeries, and ER visits in previous 12 months . Vitals . Screenings to include cognitive, depression, and falls . Referrals and appointments  In addition, I have reviewed and discussed with patient certain preventive protocols, quality metrics, and best practice recommendations. A written personalized care plan for preventive services as well as general preventive health recommendations were provided to patient.     Naaman Plummer [place], [place]  08/10/2017

## 2017-08-14 ENCOUNTER — Other Ambulatory Visit: Payer: Self-pay | Admitting: Family Medicine

## 2017-08-14 DIAGNOSIS — G4700 Insomnia, unspecified: Secondary | ICD-10-CM

## 2017-08-15 ENCOUNTER — Ambulatory Visit: Payer: Medicare Other | Admitting: *Deleted

## 2017-08-17 NOTE — Telephone Encounter (Signed)
Patient calling to check on the status of refill.

## 2017-08-18 NOTE — Telephone Encounter (Signed)
Received refill request for traZODone (DESYREL) 50 MG tablet. Last office visit 07/14/17 and last refill 04/22/17.

## 2017-08-21 ENCOUNTER — Telehealth: Payer: Medicare Other | Admitting: Physician Assistant

## 2017-08-21 DIAGNOSIS — R410 Disorientation, unspecified: Secondary | ICD-10-CM

## 2017-08-21 DIAGNOSIS — J029 Acute pharyngitis, unspecified: Secondary | ICD-10-CM

## 2017-08-21 DIAGNOSIS — B349 Viral infection, unspecified: Secondary | ICD-10-CM | POA: Diagnosis not present

## 2017-08-21 NOTE — Progress Notes (Signed)
Based on what you shared with me it looks like you have a  condition that should be evaluated in a face to face office visit. A lot of your symptoms seem consistent with a viral infection -- could be influenza although we are out of the typical "season" for this. Could also be another type of viral illness, either respiratory or more generalized. The concerning thing is it sounds like you has some altered mental status. This could have been from spiking a very high fever but I want you to have a full assessment today either at [facility] Saturday clinic or at an Urgent Care so that you can be checked out completely, the proper testing can be done for the flu versus another illness and the appropriate treatment can be given. Please do not delay care.    NOTE: If you entered your credit card information for this eVisit, you will not be charged. You may see a "hold" on your card for the $30 but that hold will drop off and you will not have a charge processed.  If you are having a true medical emergency please call 911.  If you need an urgent face to face visit, [place] has four urgent care centers for your convenience.  If you need care fast and have a high deductible or no insurance consider:   [email or web address] to reserve your spot online an avoid wait times  [facility] [address], [address] [place], [place] [address] 8 am to 8 pm Monday-Friday 10 am to 4 pm Saturday-Sunday *Across the street from [employer]  [place], [address] 8 am to 5 pm Monday-Friday * In the [facility] on the [facility]   The following sites will take your  insurance:  . [facility] Health Urgent [place] a Provider at this Location  [address] [place], [place] [address] . 10 am to 8 pm Monday-Friday . 12 pm to 8 pm Saturday-Sunday   . [facility]gent [facility] at [place] a Provider at this Location  [address] [place], [place] [address] . 8 am to 8 pm Monday-Friday . 9 am to 6 pm Saturday . 11 am to 6 pm Sunday   . [facility] at [place] Get Driving Directions  [address] [place], [place] [address] . 8 am to 8 pm Monday-Friday . 8 am to 4 pm Saturday-Sunday   Your e-visit answers were reviewed by a board certified advanced clinical practitioner to complete your personal care plan.  Thank you for using e-Visits.

## 2017-08-30 ENCOUNTER — Other Ambulatory Visit: Payer: Self-pay | Admitting: Family Medicine

## 2017-08-30 DIAGNOSIS — I10 Essential (primary) hypertension: Secondary | ICD-10-CM

## 2017-08-30 DIAGNOSIS — G609 Hereditary and idiopathic neuropathy, unspecified: Secondary | ICD-10-CM

## 2017-09-28 ENCOUNTER — Other Ambulatory Visit: Payer: Self-pay | Admitting: Family Medicine

## 2017-09-28 DIAGNOSIS — E785 Hyperlipidemia, unspecified: Secondary | ICD-10-CM

## 2017-10-10 DIAGNOSIS — Z961 Presence of intraocular lens: Secondary | ICD-10-CM | POA: Diagnosis not present

## 2017-10-14 ENCOUNTER — Other Ambulatory Visit: Payer: Self-pay | Admitting: Family Medicine

## 2017-10-14 DIAGNOSIS — I10 Essential (primary) hypertension: Secondary | ICD-10-CM

## 2017-10-22 ENCOUNTER — Encounter: Payer: Self-pay | Admitting: Family Medicine

## 2017-10-29 NOTE — Progress Notes (Addendum)
[place] at [facility] [address], [place], [place] [address] [phone]  Date:  10/31/2017   Name:  Monica Kane   DOB:  [birth year]   MRN:  [id number]  PCP:  Darreld Mclean, MD    Chief Complaint: Lab Work (would like GFR and glucose checked, pre diabetes)   History of Present Illness:  Monica Kane is a 72 y.o. very pleasant female patient who presents with the following:  Following up today- she had contacted me that she wished to check her glucose and GFR.   Most recent labs last fall History of pre-diabetes  Her sister all of a sudden was dx with new onset renal failure which made pt concerned. The cause is not known so far. bx is pending  She found out that her sister has Sjogren's disease.  She did not have any sx of this, but was noted on her eval  Pt notes that she does have mild dry eyes, but nothing out of the ordinary so she does not wish to be tested for Shogrens herself today. She recently saw her ophthalmologist who was not concerned about her eyes     Lab Results  Component Value Date   HGBA1C 5.7 (H) 08/12/2016     Patient Active Problem List   Diagnosis Date Noted  . Pre-diabetes 06/05/2015  . Primary localized osteoarthritis of right knee   . Anxiety   . Chest pain, midsternal 07/23/2013  . Osteopenia 03/23/2013  . Kidney stones, calcium oxalate 03/23/2013  . Hypertension 03/31/2011  . Hyperlipidemia 03/31/2011  . Vitamin D deficiency 03/31/2011  . Hallux rigidus 03/24/2011  . Metatarsalgia of left foot 03/24/2011  . Lateral epicondylitis of left elbow 02/24/2011  . Migraine 02/24/2011  . Depression 02/24/2011  . Arthritis 02/24/2011    Past Medical History:  Diagnosis Date  . Anxiety    takes Ativan nightly  . Depression    takes Zoloft daily  . History of colon polyps   . History of kidney stones   . History of staph infection 11/2013  . Hyperlipidemia    takes Crestor  nightly   . Hypertension    takes Lisinopril and Coreg daily  . Joint pain   . Joint swelling   . Neuropathy    takes Elavil nightly  . Pneumonia   . Primary localized osteoarthritis of right knee     Past Surgical History:  Procedure Laterality Date  . ABDOMINAL HYSTERECTOMY  1989  . APPENDECTOMY    . CARDIAC CATHETERIZATION  2015  . CERVICAL SPINE SURGERY  2004   C 5-6  . COLONOSCOPY    . colonscopy    . DIAGNOSTIC LAPAROSCOPY  1976   fibroid removed  . ELBOW ARTHROSCOPY  2010, 2012  . EYE SURGERY Bilateral    cataract  . FOOT SURGERY Left 2013  . KNEE ARTHROSCOPY  Oct 2014  . KNEE ARTHROSCOPY W/ MENISCECTOMY Right 01/31/2013   Noemi Chapel  . LATERAL EPICONDYLE RELEASE  03/01/2011   Procedure: TENNIS ELBOW RELEASE;  Surgeon: Lorn Junes, MD;  Location: [place];  Service: Orthopedics;  Laterality: Left;  TENOTOMY ELBOW LATERAL EPICONDYLITIS TENNIS ELBOW  . lateral release right elbow  2010  . left foot surgery  2013   mortons neuroma  . LEFT HEART CATHETERIZATION WITH CORONARY ANGIOGRAM N/A 07/23/2013   Procedure: LEFT HEART CATHETERIZATION WITH CORONARY ANGIOGRAM;  Surgeon: Sinclair Grooms, MD;  Location: [place] CATH LAB;  Service: Cardiovascular;  Laterality: N/A;  . PAROTID GLAND TUMOR EXCISION  1976   benign  . PAROTID GLAND TUMOR EXCISION  1976   rt  . SHOULDER ARTHROSCOPY DISTAL CLAVICLE EXCISION AND OPEN ROTATOR CUFF REPAIR  2006  . SHOULDER ARTHROSCOPY W/ ROTATOR CUFF REPAIR  2006  . TOTAL KNEE ARTHROPLASTY Right 02/18/2014   Procedure: TOTAL KNEE ARTHROPLASTY;  Surgeon: Lorn Junes, MD;  Location: [place];  Service: Orthopedics;  Laterality: Right;    Social History   Tobacco Use  . Smoking status: Never Smoker  . Smokeless tobacco: Never Used  Substance Use Topics  . Alcohol use: Yes    Comment: 1-2 drinks weekly  . Drug use: No    Family History  Problem Relation Age of Onset  . Cancer Mother        lung  . Cancer Father         lung; + tobacco  . Heart disease Maternal Grandfather        heart attack  . Heart disease Paternal Grandfather        heart attack  . Diabetes Sister   . Hypertension Sister   . Hyperlipidemia Sister     Allergies  Allergen Reactions  . Penicillins Swelling and Rash    Medication list has been reviewed and updated.  Current Outpatient Medications on File Prior to Visit  Medication Sig Dispense Refill  . albuterol (PROVENTIL HFA;VENTOLIN HFA) 108 (90 Base) MCG/ACT inhaler Inhale 2 puffs every 6 (six) hours as needed into the lungs for wheezing or shortness of breath. 1 Inhaler 0  . amitriptyline (ELAVIL) 25 MG tablet TAKE ONE TABLET AT BEDTIME. 90 tablet 1  . carvedilol (COREG) 3.125 MG tablet Take 3.125 mg by mouth at bedtime. TAKE ONE TABLET BY MOUTH DAILY AT BEDTIME    . ibandronate (BONIVA) 150 MG tablet Take 1 monthly in the morning with a glass of water, empty stomach, nothing anything else by mouth or lie down for the next 30 min. 3 tablet 4  . lisinopril (PRINIVIL,ZESTRIL) 20 MG tablet TAKE 1 TABLET BY MOUTH DAILY. 90 tablet 1  . nitroGLYCERIN (NITROSTAT) 0.4 MG SL tablet Place 1 tablet (0.4 mg total) under the tongue every 5 (five) minutes as needed for chest pain. 14 tablet 0  . rosuvastatin (CRESTOR) 10 MG tablet TAKE 1 TABLET ONCE DAILY. 90 tablet 0  . sertraline (ZOLOFT) 100 MG tablet TAKE 2 & 1/2 TABLETS DAILY. 75 tablet 11  . traZODone (DESYREL) 50 MG tablet TAKE 1 OR 2 TABLETS AT BEDTIME. 180 tablet 3   No current facility-administered medications on file prior to visit.     Review of Systems:  As per HPI- otherwise negative.   Physical Examination: Vitals:   10/31/17 1344  BP: 124/72  Pulse: 72  Resp: 16  SpO2: 98%   Vitals:   10/31/17 1344  Weight: 160 lb 9.6 oz (72.8 kg)  Height: 5' 5.7" (1.669 m)   Body mass index is 26.16 kg/m. Ideal Body Weight: Weight in (lb) to have BMI = 25: 153.2  GEN: WDWN, NAD, Non-toxic, A & O x 3, normal weight,  looks well  HEENT: Atraumatic, Normocephalic. Neck supple. No masses, No LAD.  Bilateral TM wnl, oropharynx normal.  PEERL,EOMI.   Ears and Nose: No external deformity. CV: RRR, No M/G/R. No JVD. No thrill. No extra heart sounds. PULM: CTA B, no wheezes, crackles, rhonchi. No retractions. No resp. distress. No accessory  muscle use. EXTR: No c/c/e NEURO Normal gait.  PSYCH: Normally interactive. Conversant. Not depressed or anxious appearing.  Calm demeanor.    Assessment and Plan: Benign essential HTN - Plan: CBC, Comprehensive metabolic panel  Hyperglycemia - Plan: Comprehensive metabolic panel, Hemoglobin A1c  Mixed hyperlipidemia - Plan: Lipid panel  Hyperlipidemia, unspecified hyperlipidemia type - Plan: rosuvastatin (CRESTOR) 10 MG tablet  Here today with concern about labs- will check her labs above today.  The cause of her sister's sudden renal failure is not yet known, but she will let me know what her bx shows Will check her renal function, etc today Also check her A1c and refill crestor today  BP is under fine control   Signed Lamar Blinks, MD  Received her labs 7/16 Results for orders placed or performed in visit on 10/31/17  CBC  Result Value Ref Range   WBC 5.7 4.0 - 10.5 K/uL   RBC 5.04 3.87 - 5.11 Mil/uL   Platelets 185.0 150.0 - 400.0 K/uL   Hemoglobin 13.1 12.0 - 15.0 g/dL   HCT 40.6 36.0 - 46.0 %   MCV 80.5 78.0 - 100.0 fl   MCHC 32.3 30.0 - 36.0 g/dL   RDW 14.0 11.5 - 15.5 %  Comprehensive metabolic panel  Result Value Ref Range   Sodium 140 135 - 145 mEq/L   Potassium 4.1 3.5 - 5.1 mEq/L   Chloride 107 96 - 112 mEq/L   CO2 27 19 - 32 mEq/L   Glucose, Bld 107 (H) 70 - 99 mg/dL   BUN 17 6 - 23 mg/dL   Creatinine, Ser 0.90 0.40 - 1.20 mg/dL   Total Bilirubin 0.4 0.2 - 1.2 mg/dL   Alkaline Phosphatase 74 39 - 117 U/L   AST 15 0 - 37 U/L   ALT 14 0 - 35 U/L   Total Protein 6.2 6.0 - 8.3 g/dL   Albumin 4.1 3.5 - 5.2 g/dL   Calcium 9.3 8.4 - 10.5  mg/dL   GFR 65.80 >60.00 mL/min  Hemoglobin A1c  Result Value Ref Range   Hgb A1c MFr Bld 6.3 4.6 - 6.5 %  Lipid panel  Result Value Ref Range   Cholesterol 170 0 - 200 mg/dL   Triglycerides 124.0 0.0 - 149.0 mg/dL   HDL 62.70 >39.00 mg/dL   VLDL 24.8 0.0 - 40.0 mg/dL   LDL Cholesterol 83 0 - 99 mg/dL   Total CHOL/HDL Ratio 3    NonHDL 107.30     Blood counts are normal Metabolic profile looks ok- kidney function is normal Cholesterol is good- continue to use your cholesterol med Your A1c- average blood sugar-is in the pre-diabetes range range but is getting closer to 6.5% which is the cut off for diabetes.  We might want to consider starting an oral medication to lower your blood sugar and hopefully prevent progression to diabetes. Would you be interested in this?    In any case let's plan to visit in about 6 months to check in

## 2017-10-31 ENCOUNTER — Encounter: Payer: Self-pay | Admitting: Family Medicine

## 2017-10-31 ENCOUNTER — Ambulatory Visit (INDEPENDENT_AMBULATORY_CARE_PROVIDER_SITE_OTHER): Payer: Medicare Other | Admitting: Family Medicine

## 2017-10-31 VITALS — BP 124/72 | HR 72 | Resp 16 | Ht 65.7 in | Wt 160.6 lb

## 2017-10-31 DIAGNOSIS — I10 Essential (primary) hypertension: Secondary | ICD-10-CM | POA: Diagnosis not present

## 2017-10-31 DIAGNOSIS — R739 Hyperglycemia, unspecified: Secondary | ICD-10-CM | POA: Diagnosis not present

## 2017-10-31 DIAGNOSIS — E782 Mixed hyperlipidemia: Secondary | ICD-10-CM | POA: Diagnosis not present

## 2017-10-31 DIAGNOSIS — E785 Hyperlipidemia, unspecified: Secondary | ICD-10-CM

## 2017-10-31 MED ORDER — ROSUVASTATIN CALCIUM 10 MG PO TABS: 10.0000 mg | ORAL_TABLET | Freq: Every day | ORAL | 3 refills | Status: DC

## 2017-10-31 NOTE — Patient Instructions (Signed)
It was good to see you again today- take care and I will be in touch with your results asap  I hope that your sister does ok- please let me know what they find out after her biopsy

## 2017-11-01 ENCOUNTER — Encounter: Payer: Self-pay | Admitting: Family Medicine

## 2017-11-01 DIAGNOSIS — L570 Actinic keratosis: Secondary | ICD-10-CM | POA: Diagnosis not present

## 2017-11-01 LAB — COMPREHENSIVE METABOLIC PANEL
ALK PHOS: 74 U/L (ref 39–117)
ALT: 14 U/L (ref 0–35)
AST: 15 U/L (ref 0–37)
Albumin: 4.1 g/dL (ref 3.5–5.2)
BILIRUBIN TOTAL: 0.4 mg/dL (ref 0.2–1.2)
BUN: 17 mg/dL (ref 6–23)
CALCIUM: 9.3 mg/dL (ref 8.4–10.5)
CO2: 27 mEq/L (ref 19–32)
Chloride: 107 mEq/L (ref 96–112)
Creatinine, Ser: 0.9 mg/dL (ref 0.40–1.20)
GFR: 65.8 mL/min (ref >60.00)
Glucose, Bld: 107 mg/dL — ABNORMAL HIGH (ref 70–99)
Potassium: 4.1 mEq/L (ref 3.5–5.1)
Sodium: 140 mEq/L (ref 135–145)
Total Protein: 6.2 g/dL (ref 6.0–8.3)

## 2017-11-01 LAB — LIPID PANEL
CHOL/HDL RATIO: 3
CHOLESTEROL: 170 mg/dL (ref 0–200)
HDL: 62.7 mg/dL (ref >39.00)
LDL CALC: 83 mg/dL (ref 0–99)
NonHDL: 107.3
Triglycerides: 124 mg/dL (ref 0.0–149.0)
VLDL: 24.8 mg/dL (ref 0.0–40.0)

## 2017-11-01 LAB — CBC
HCT: 40.6 % (ref 36.0–46.0)
HEMOGLOBIN: 13.1 g/dL (ref 12.0–15.0)
MCHC: 32.3 g/dL (ref 30.0–36.0)
MCV: 80.5 fl (ref 78.0–100.0)
PLATELETS: 185 10*3/uL (ref 150.0–400.0)
RBC: 5.04 Mil/uL (ref 3.87–5.11)
RDW: 14 % (ref 11.5–15.5)
WBC: 5.7 10*3/uL (ref 4.0–10.5)

## 2017-11-01 LAB — HEMOGLOBIN A1C: Hgb A1c MFr Bld: 6.3 % (ref 4.6–6.5)

## 2017-12-02 ENCOUNTER — Emergency Department (HOSPITAL_BASED_OUTPATIENT_CLINIC_OR_DEPARTMENT_OTHER): Payer: Medicare Other

## 2017-12-02 ENCOUNTER — Other Ambulatory Visit: Payer: Self-pay

## 2017-12-02 ENCOUNTER — Encounter (HOSPITAL_BASED_OUTPATIENT_CLINIC_OR_DEPARTMENT_OTHER): Payer: Self-pay

## 2017-12-02 ENCOUNTER — Emergency Department (HOSPITAL_BASED_OUTPATIENT_CLINIC_OR_DEPARTMENT_OTHER)
Admission: EM | Admit: 2017-12-02 | Discharge: 2017-12-02 | Disposition: A | Discharge status: Home / Self Care | Payer: Medicare Other | Attending: Emergency Medicine | Admitting: Emergency Medicine

## 2017-12-02 ENCOUNTER — Ambulatory Visit: Payer: Self-pay | Admitting: *Deleted

## 2017-12-02 ENCOUNTER — Encounter: Payer: Self-pay | Admitting: Family Medicine

## 2017-12-02 DIAGNOSIS — Z79899 Other long term (current) drug therapy: Secondary | ICD-10-CM | POA: Insufficient documentation

## 2017-12-02 DIAGNOSIS — W2209XA Striking against other stationary object, initial encounter: Secondary | ICD-10-CM | POA: Diagnosis not present

## 2017-12-02 DIAGNOSIS — Z96651 Presence of right artificial knee joint: Secondary | ICD-10-CM | POA: Insufficient documentation

## 2017-12-02 DIAGNOSIS — S060X0A Concussion without loss of consciousness, initial encounter: Secondary | ICD-10-CM | POA: Insufficient documentation

## 2017-12-02 DIAGNOSIS — S0990XA Unspecified injury of head, initial encounter: Secondary | ICD-10-CM | POA: Diagnosis not present

## 2017-12-02 DIAGNOSIS — Y999 Unspecified external cause status: Secondary | ICD-10-CM | POA: Insufficient documentation

## 2017-12-02 DIAGNOSIS — Y929 Unspecified place or not applicable: Secondary | ICD-10-CM | POA: Diagnosis not present

## 2017-12-02 DIAGNOSIS — I10 Essential (primary) hypertension: Secondary | ICD-10-CM | POA: Diagnosis not present

## 2017-12-02 DIAGNOSIS — Y939 Activity, unspecified: Secondary | ICD-10-CM | POA: Diagnosis not present

## 2017-12-02 DIAGNOSIS — S199XXA Unspecified injury of neck, initial encounter: Secondary | ICD-10-CM | POA: Diagnosis not present

## 2017-12-02 MED ORDER — ONDANSETRON 4 MG PO TBDP: 4.0000 mg | ORAL_TABLET | Freq: Three times a day (TID) | ORAL | 0 refills | Status: DC | PRN

## 2017-12-02 MED ORDER — ONDANSETRON 4 MG PO TBDP
4.0000 mg | ORAL_TABLET | Freq: Once | ORAL | Status: AC
  Administered 2017-12-02: 4 mg via ORAL
  Filled 2017-12-02: qty 1

## 2017-12-02 MED FILL — ONDANSETRON ODT 4 MG TABLET: 4 | 7 days supply | Qty: 20 | Fill #0

## 2017-12-02 NOTE — ED Provider Notes (Signed)
[facility] EMERGENCY DEPARTMENT Provider Note   CSN: [id number] Arrival date & time: 12/02/17  1406     History   Chief Complaint Chief Complaint  Patient presents with  . Head Injury    HPI Monica Kane is a 72 y.o. female with a history of hypertension, hyperlipidemia who presents to the emergency department with a chief complaint of head injury.  The patient reports that she hit her head on the metal part of a rowing machine 2 nights ago.  No LOC or emesis, but the patient reports she began having nausea immediately after the injury.  Her nausea resolved the next morning.  She reports that yesterday she had an episode of left-sided epistaxis that lasted for several minutes yesterday afternoon, which is not uncommon for her as she has had a long-standing history of nosebleeds.  She reports that she became concerned yesterday afternoon when she felt a "pop" sudden onset pain in her retro-orbital region.  She states that this morning she felt as if the left side of her face was swollen and a little droopy. No h/o of similar.  She states that she called her PCP who advised her to come to the emergency department for further evaluation.  She denies vomiting, diplopia, photophobia, dizziness, lightheadedness, chest pain, dyspnea, numbness.   The history is provided by the patient. No language interpreter was used.    Past Medical History:  Diagnosis Date  . Anxiety    takes Ativan nightly  . Depression    takes Zoloft daily  . History of colon polyps   . History of kidney stones   . History of staph infection 11/2013  . Hyperlipidemia    takes Crestor nightly   . Hypertension    takes Lisinopril and Coreg daily  . Joint pain   . Joint swelling   . Neuropathy    takes Elavil nightly  . Pneumonia   . Primary localized osteoarthritis of right knee     Patient Active Problem List   Diagnosis Date Noted  . Pre-diabetes 06/05/2015  . Primary localized  osteoarthritis of right knee   . Anxiety   . Chest pain, midsternal 07/23/2013  . Osteopenia 03/23/2013  . Kidney stones, calcium oxalate 03/23/2013  . Hypertension 03/31/2011  . Hyperlipidemia 03/31/2011  . Vitamin D deficiency 03/31/2011  . Hallux rigidus 03/24/2011  . Metatarsalgia of left foot 03/24/2011  . Lateral epicondylitis of left elbow 02/24/2011  . Migraine 02/24/2011  . Depression 02/24/2011  . Arthritis 02/24/2011    Past Surgical History:  Procedure Laterality Date  . ABDOMINAL HYSTERECTOMY  1989  . APPENDECTOMY    . CARDIAC CATHETERIZATION  2015  . CERVICAL SPINE SURGERY  2004   C 5-6  . COLONOSCOPY    . colonscopy    . DIAGNOSTIC LAPAROSCOPY  1976   fibroid removed  . ELBOW ARTHROSCOPY  2010, 2012  . EYE SURGERY Bilateral    cataract  . FOOT SURGERY Left 2013  . KNEE ARTHROSCOPY  Oct 2014  . KNEE ARTHROSCOPY W/ MENISCECTOMY Right 01/31/2013   [facility]  . LATERAL EPICONDYLE RELEASE  03/01/2011   Procedure: TENNIS ELBOW RELEASE;  Surgeon: Lorn Junes, MD;  Location: [facility];  Service: Orthopedics;  Laterality: Left;  TENOTOMY ELBOW LATERAL EPICONDYLITIS TENNIS ELBOW  . lateral release right elbow  2010  . left foot surgery  2013   mortons neuroma  . LEFT HEART CATHETERIZATION WITH CORONARY ANGIOGRAM N/A 07/23/2013  Procedure: LEFT HEART CATHETERIZATION WITH CORONARY ANGIOGRAM;  Surgeon: Sinclair Grooms, MD;  Location: [facility] C[facility];  Service: Cardiovascular;  Laterality: N/A;  . PAROTID GLAND TUMOR EXCISION  1976   benign  . PAROTID GLAND TUMOR EXCISION  1976   rt  . SHOULDER ARTHROSCOPY DISTAL CLAVICLE EXCISION AND OPEN ROTATOR CUFF REPAIR  2006  . SHOULDER ARTHROSCOPY W/ ROTATOR CUFF REPAIR  2006  . TOTAL KNEE ARTHROPLASTY Right 02/18/2014   Procedure: TOTAL KNEE ARTHROPLASTY;  Surgeon: Lorn Junes, MD;  Location: [facility];  Service: Orthopedics;  Laterality: Right;     OB History   None      Home Medications    Prior to  Admission medications   Medication Sig Start Date End Date Taking? Authorizing Provider  albuterol (PROVENTIL HFA;VENTOLIN HFA) 108 (90 Base) MCG/ACT inhaler Inhale 2 puffs every 6 (six) hours as needed into the lungs for wheezing or shortness of breath. 03/07/17   Copland, Gay Filler, MD  amitriptyline (ELAVIL) 25 MG tablet TAKE ONE TABLET AT BEDTIME. 08/31/17   Copland, Gay Filler, MD  carvedilol (COREG) 3.125 MG tablet Take 3.125 mg by mouth at bedtime. TAKE ONE TABLET BY MOUTH DAILY AT BEDTIME    [provider]  ibandronate (BONIVA) 150 MG tablet Take 1 monthly in the morning with a glass of water, empty stomach, nothing anything else by mouth or lie down for the next 30 min. 06/03/15   Copland, Gay Filler, MD  lisinopril (PRINIVIL,ZESTRIL) 20 MG tablet TAKE 1 TABLET BY MOUTH DAILY. 08/31/17   Copland, Gay Filler, MD  nitroGLYCERIN (NITROSTAT) 0.4 MG SL tablet Place 1 tablet (0.4 mg total) under the tongue every 5 (five) minutes as needed for chest pain. 07/23/13   Bernadene Bell, MD  ondansetron (ZOFRAN ODT) 4 MG disintegrating tablet Take 1 tablet (4 mg total) by mouth every 8 (eight) hours as needed for nausea or vomiting. 12/02/17   Nishat Livingston A, PA-C  rosuvastatin (CRESTOR) 10 MG tablet Take 1 tablet (10 mg total) by mouth daily. 10/31/17   Copland, Gay Filler, MD  sertraline (ZOLOFT) 100 MG tablet TAKE 2 & 1/2 TABLETS DAILY. 05/05/17   Copland, Gay Filler, MD  traZODone (DESYREL) 50 MG tablet TAKE 1 OR 2 TABLETS AT BEDTIME. 08/18/17   Copland, Gay Filler, MD    Family History Family History  Problem Relation Age of Onset  . Cancer Mother        lung  . Cancer Father        lung; + tobacco  . Heart disease Maternal Grandfather        heart attack  . Heart disease Paternal Grandfather        heart attack  . Diabetes Sister   . Hypertension Sister   . Hyperlipidemia Sister     Social History Social History   Tobacco Use  . Smoking status: Never Smoker  . Smokeless tobacco:  Never Used  Substance Use Topics  . Alcohol use: Yes    Comment: 1-2 drinks weekly  . Drug use: No     Allergies   Penicillins   Review of Systems Review of Systems  Constitutional: Negative for activity change, chills and fever.  HENT: Positive for facial swelling. Negative for congestion and sore throat.   Eyes: Negative for visual disturbance.  Respiratory: Negative for cough and shortness of breath.   Cardiovascular: Negative for chest pain.  Gastrointestinal: Positive for nausea. Negative for abdominal pain, diarrhea and vomiting.  Genitourinary: Negative for dysuria.  Musculoskeletal: Negative for back pain.  Skin: Negative for rash.  Allergic/Immunologic: Negative for immunocompromised state.  Neurological: Positive for headaches. Negative for dizziness, syncope, speech difficulty, weakness and numbness.  Psychiatric/Behavioral: Negative for confusion.     Physical Exam Updated Vital Signs BP (!) 173/78 (BP Location: Left Arm)   Pulse 66   Temp 98.3 F (36.8 C) (Oral)   Resp 18   Ht 5\' 4"  (1.626 m)   Wt 73 kg   SpO2 97%   BMI 27.64 kg/m   Physical Exam  Constitutional: No distress.  HENT:  Head: Normocephalic.  No facial edema bilaterally.  Eyes: Pupils are equal, round, and reactive to light. Conjunctivae and EOM are normal.  Neck: Neck supple.  Cardiovascular: Normal rate, regular rhythm, normal heart sounds and intact distal pulses. Exam reveals no gallop and no friction rub.  No murmur heard. Pulmonary/Chest: Effort normal. No stridor. No respiratory distress. She has no wheezes. She has no rales. She exhibits no tenderness.  Abdominal: Soft. She exhibits no distension. There is no tenderness.  Neurological: She is alert.  GCS 15.  ANO x4.  Cranial nerves II through XII are grossly intact.  Finger-nose and heel-to-shin are intact and symmetric bilaterally.  Normal rapid alternating movements.  No clonus bilaterally.  Sensation is intact and symmetric  throughout.  5 out of 5 strength against resistance of the large muscle groups of the bilateral upper and lower extremities.  No pronator drift.  Negative Romberg.  Symmetric tandem gait.  No slurred speech.  Skin: Skin is warm. No rash noted.  Psychiatric: Her behavior is normal.  Nursing note and vitals reviewed.    ED Treatments / Results  Labs (all labs ordered are listed, but only abnormal results are displayed) Labs Reviewed - No data to display  EKG None  Radiology Ct Head Wo Contrast  Result Date: 12/02/2017 CLINICAL DATA:  Fall 3 days ago. EXAM: CT HEAD WITHOUT CONTRAST CT CERVICAL SPINE WITHOUT CONTRAST TECHNIQUE: Multidetector CT imaging of the head and cervical spine was performed following the standard protocol without intravenous contrast. Multiplanar CT image reconstructions of the cervical spine were also generated. COMPARISON:  None. FINDINGS: CT HEAD FINDINGS Brain: There is no mass, hemorrhage or extra-axial collection. The size and configuration of the ventricles and extra-axial CSF spaces are normal. There is no acute or chronic infarction. The brain parenchyma is normal. Vascular: No abnormal hyperdensity of the major intracranial arteries or dural venous sinuses. No intracranial atherosclerosis. Skull: The visualized skull base, calvarium and extracranial soft tissues are normal. Sinuses/Orbits: No fluid levels or advanced mucosal thickening of the visualized paranasal sinuses. No mastoid or middle ear effusion. The orbits are normal. CT CERVICAL SPINE FINDINGS Alignment: No static subluxation. Facets are aligned. Occipital condyles are normally positioned. Skull base and vertebrae: C4-C6 ACDF. No acute fracture. No hardware abnormality. There is solid osseous fusion at the instrument levels. Soft tissues and spinal canal: No prevertebral fluid or swelling. No visible canal hematoma. Disc levels: No advanced spinal canal or neural foraminal stenosis. Upper chest: No  pneumothorax, pulmonary nodule or pleural effusion. Other: Normal visualized paraspinal cervical soft tissues. IMPRESSION: 1. Normal brain. 2. No acute fracture or static subluxation of the cervical spine. Electronically Signed   By: Ulyses Jarred M.D.   On: 12/02/2017 15:38   Ct Cervical Spine Wo Contrast  Result Date: 12/02/2017 CLINICAL DATA:  Fall 3 days ago. EXAM: CT HEAD WITHOUT CONTRAST CT CERVICAL SPINE WITHOUT  CONTRAST TECHNIQUE: Multidetector CT imaging of the head and cervical spine was performed following the standard protocol without intravenous contrast. Multiplanar CT image reconstructions of the cervical spine were also generated. COMPARISON:  None. FINDINGS: CT HEAD FINDINGS Brain: There is no mass, hemorrhage or extra-axial collection. The size and configuration of the ventricles and extra-axial CSF spaces are normal. There is no acute or chronic infarction. The brain parenchyma is normal. Vascular: No abnormal hyperdensity of the major intracranial arteries or dural venous sinuses. No intracranial atherosclerosis. Skull: The visualized skull base, calvarium and extracranial soft tissues are normal. Sinuses/Orbits: No fluid levels or advanced mucosal thickening of the visualized paranasal sinuses. No mastoid or middle ear effusion. The orbits are normal. CT CERVICAL SPINE FINDINGS Alignment: No static subluxation. Facets are aligned. Occipital condyles are normally positioned. Skull base and vertebrae: C4-C6 ACDF. No acute fracture. No hardware abnormality. There is solid osseous fusion at the instrument levels. Soft tissues and spinal canal: No prevertebral fluid or swelling. No visible canal hematoma. Disc levels: No advanced spinal canal or neural foraminal stenosis. Upper chest: No pneumothorax, pulmonary nodule or pleural effusion. Other: Normal visualized paraspinal cervical soft tissues. IMPRESSION: 1. Normal brain. 2. No acute fracture or static subluxation of the cervical spine.  Electronically Signed   By: Ulyses Jarred M.D.   On: 12/02/2017 15:38    Procedures Procedures (including critical care time)  Medications Ordered in ED Medications  ondansetron (ZOFRAN-ODT) disintegrating tablet 4 mg (4 mg Oral Given 12/02/17 1511)     Initial Impression / Assessment and Plan / ED Course  I have reviewed the triage vital signs and the nursing notes.  Pertinent labs & imaging results that were available during my care of the patient were reviewed by me and considered in my medical decision making (see chart for details).     72 year old female with a history of hypertension and hyperlipidemia presenting with a head injury that occurred 2 days ago.  She had a short episode of epistaxis yesterday that spontaneously resolved without treatment, but became concerned after she noticed some left-sided facial swelling and questionable facial drooping when she woke this morning.  She did become nauseated with standing for ambulation, which resolved with Zofran.  The patient was seen and evaluated along with Dr. Ellender Hose, attending physician.  No focal neurologic deficits on exam, specifically no facial droop, slurred speech, or facial edema.  CT head and cervical spine are unremarkable.  Doubt CVA or ICH. suspect the patient has a concussion.  Will provide her with a referral to the concussion clinic and education provided about postconcussive syndrome.  She is hemodynamically stable and in no acute distress.  She is safe for discharge to home with outpatient follow-up at this time.  Final Clinical Impressions(s) / ED Diagnoses   Final diagnoses:  Concussion without loss of consciousness, initial encounter    ED Discharge Orders         Ordered    ondansetron (ZOFRAN ODT) 4 MG disintegrating tablet  Every 8 hours PRN     12/02/17 1558           Mollie Rossano A, PA-C 12/02/17 1743    Duffy Bruce, MD 12/04/17 [phone]

## 2017-12-02 NOTE — Telephone Encounter (Signed)
Patient going to ED. Patient also sent mychart message.

## 2017-12-02 NOTE — ED Notes (Signed)
ED Provider at bedside. 

## 2017-12-02 NOTE — Telephone Encounter (Signed)
Pt states that on Wednesday she was on her knees looking for something on the ground and when she attempted to stand up her left food did not support her and she fell. Pt states she hit the crown area of the left side of her head. Pt states that she did experience nausea that night but none currently.  Pt also sent a Mychart message stating the following:"I had a mild fall (tipped over trying to get up from being on knees to look for something) on Wednesday night. I did hit my head and it hurt for a little bit but went away that night. Main pain was from left hip and knee. Last night I was reading and felt a "ping" in my upper nose that was so strong I actually jumped. Followed by nose bleed (I often get them but not with ping beforehand) which was under control within 61min. Today when I woke up, left side of my face swollen and just a little droopy. A friend could see it and thought I should get in touch with you.  I will call to give a nurse the same message".  Pt denies any numbness or tingling, change in vision or speech, unsteadiness or other symptoms at this time. Pt states that when she woke up this morining the left side of her face was really red but now it is just her cheek. Pt states when looking in the mirror it looks as if she tilted her head to the left. Pt advised that she would need to be seen in the next 4 hours due to symptoms. PCP not available for appt today. No appt available with other providers in the office. Pt advised to seek care in the ED regarding symptoms at this time. Pt verbalized understanding and states that she will go to ED.  Reason for Disposition . [1] Age over 80 years AND [2] swelling or bruise  Answer Assessment - Initial Assessment Questions 1. MECHANISM: "How did the injury happen?" For falls, ask: "What height did you fall from?" and "What surface did you fall against?"      Was on her knees reaching for something on the ground and tried to stand up when left foot  gave out when attempting to stand and hit head on wooden book shelf 2. ONSET: "When did the injury happen?" (Minutes or hours ago)      Wednesday 3. NEUROLOGIC SYMPTOMS: "Was there any loss of consciousness?" "Are there any other neurological symptoms?"      No LOC, but was stunned and could not figure out how to get up so had to call her husband for assistance with getting up 4. MENTAL STATUS: "Does the person know who he is, who you are, and where he is?"      Alert and oriented x3 5. LOCATION: "What part of the head was hit?"      Left side of head 6. SCALP APPEARANCE: "What does the scalp look like? Is it bleeding now?" If so, ask: "Is it difficult to stop?"      No bleeding, cuts or bruises 7. SIZE: For cuts, bruises, or swelling, ask: "How large is it?" (e.g., inches or centimeters)      Swelling to left side of face 8. PAIN: "Is there any pain?" If so, ask: "How bad is it?"  (e.g., Scale 1-10; or mild, moderate, severe)     Headache- rates pain at 1 currenlty 9. TETANUS: For any breaks in the skin, ask: "When  was the last tetanus booster?"     n/a 10. OTHER SYMPTOMS: "Do you have any other symptoms?" (e.g., neck pain, vomiting)       Nausea the night of fall but not at this time  Protocols used: HEAD INJURY-A-AH

## 2017-12-02 NOTE — Discharge Instructions (Signed)
Thank you for allowing me to care for you today in the Emergency Department.   Take 600 mg of ibuprofen with food or 650 mg of Tylenol every 6 hours for pain control.  Let 1 tablet of Zofran dissolve in your tongue every 8 hours as needed for nausea or vomiting.  Return to the emergency department if you have another injury, if you pass out, develop double vision, or a high fever.

## 2017-12-02 NOTE — ED Triage Notes (Signed)
Pt states she lost balance in stooped position 2 days ago-struck top of head-no break in skin -no LOC-had sudden onset of pain to nose with nosebleed lat night-c/o HA since injury-left side of face "red, swollen and a little bit droopy" when she woke this am approx 9am-NAD-steady gait

## 2018-01-03 NOTE — Progress Notes (Signed)
[facility] [address] [place], [place] [address] [phone]  Date:  01/04/2018   Name:  Monica Kane   DOB:  Oct 02, 1945   MRN:  [id number]  PCP:  Darreld Mclean, MD    Chief Complaint: Concussion (follow up, still having pain behind eyes and ears feel like they need to pop, neck ache, feeling better and would like to go back to normal routine)   History of Present Illness:  Monica Kane is a 72 y.o. very pleasant female patient who presents with the following:  Following up from ER visit on 8/16 with a concussion: VIRDELL Kane is a 72 y.o. female with a history of hypertension, hyperlipidemia who presents to the emergency department with a chief complaint of head injury.  The patient reports that she hit her head on the metal part of a rowing machine 2 nights ago.  No LOC or emesis, but the patient reports she began having nausea immediately after the injury.  Her nausea resolved the next morning.  She reports that yesterday she had an episode of left-sided epistaxis that lasted for several minutes yesterday afternoon, which is not uncommon for her as she has had a long-standing history of nosebleeds.  She reports that she became concerned yesterday afternoon when she felt a "pop" sudden onset pain in her retro-orbital region.  She states that this morning she felt as if the left side of her face was swollen and a little droopy. No h/o of similar.  She states that she called her PCP who advised her to come to the emergency department for further evaluation./////////////////////////////////// 72 year old female with a history of hypertension and hyperlipidemia presenting with a head injury that occurred 2 days ago.  She had a short episode of epistaxis yesterday that spontaneously resolved without treatment, but became concerned after she noticed some left-sided facial swelling and questionable facial drooping when she  woke this morning.  She did become nauseated with standing for ambulation, which resolved with Zofran.  The patient was seen and evaluated along with Dr. Ellender Hose, attending physician.  No focal neurologic deficits on exam, specifically no facial droop, slurred speech, or facial edema.  CT head and cervical spine are unremarkable.  Doubt CVA or ICH. suspect the patient has a concussion.  Will provide her with a referral to the concussion clinic and education provided about postconcussive syndrome.  She is hemodynamically stable and in no acute distress.  She is safe for discharge to home with outpatient follow-up at this time.  She was down on the floor getting something from under a rowing machine - she lost her balance and hit her head  She is feeling better overall, but has some mild symptoms still She is now back to driving and doing her ballroom dance routine for exercise 3x a week "I just want to make sure I'm ok to do these things" Her ears still feel "like they need to pop" and "my eyes get tired."  She may get a mild ache at the base of her neck at times She did have a HA several days ago but none now She is still feeling tired Her brain is still a bit slowed down but improving At night she is sleeping ok Her mood is good She is retired from her working life She did have a likely mild concussion a couple of years ago but did not seek medical attention at  that time   Flu shot done today  Patient Active Problem List   Diagnosis Date Noted  . Pre-diabetes 06/05/2015  . Primary localized osteoarthritis of right knee   . Anxiety   . Chest pain, midsternal 07/23/2013  . Osteopenia 03/23/2013  . Kidney stones, calcium oxalate 03/23/2013  . Hypertension 03/31/2011  . Hyperlipidemia 03/31/2011  . Vitamin D deficiency 03/31/2011  . Hallux rigidus 03/24/2011  . Metatarsalgia of left foot 03/24/2011  . Lateral epicondylitis of left elbow 02/24/2011  . Migraine 02/24/2011  . Depression  02/24/2011  . Arthritis 02/24/2011    Past Medical History:  Diagnosis Date  . Anxiety    takes Ativan nightly  . Depression    takes Zoloft daily  . History of colon polyps   . History of kidney stones   . History of staph infection 11/2013  . Hyperlipidemia    takes Crestor nightly   . Hypertension    takes Lisinopril and Coreg daily  . Joint pain   . Joint swelling   . Neuropathy    takes Elavil nightly  . Pneumonia   . Primary localized osteoarthritis of right knee     Past Surgical History:  Procedure Laterality Date  . ABDOMINAL HYSTERECTOMY  1989  . APPENDECTOMY    . CARDIAC CATHETERIZATION  2015  . CERVICAL SPINE SURGERY  2004   C 5-6  . COLONOSCOPY    . colonscopy    . DIAGNOSTIC LAPAROSCOPY  1976   fibroid removed  . ELBOW ARTHROSCOPY  2010, 2012  . EYE SURGERY Bilateral    cataract  . FOOT SURGERY Left 2013  . KNEE ARTHROSCOPY  Oct 2014  . KNEE ARTHROSCOPY W/ MENISCECTOMY Right 01/31/2013   [facility]  . LATERAL EPICONDYLE RELEASE  03/01/2011   Procedure: TENNIS ELBOW RELEASE;  Surgeon: Lorn Junes, MD;  Location: [facility];  Service: Orthopedics;  Laterality: Left;  TENOTOMY ELBOW LATERAL EPICONDYLITIS TENNIS ELBOW  . lateral release right elbow  2010  . left foot surgery  2013   mortons neuroma  . LEFT HEART CATHETERIZATION WITH CORONARY ANGIOGRAM N/A 07/23/2013   Procedure: LEFT HEART CATHETERIZATION WITH CORONARY ANGIOGRAM;  Surgeon: Sinclair Grooms, MD;  Location: [facility] CATH LAB;  Service: Cardiovascular;  Laterality: N/A;  . PAROTID GLAND TUMOR EXCISION  1976   benign  . PAROTID GLAND TUMOR EXCISION  1976   rt  . SHOULDER ARTHROSCOPY DISTAL CLAVICLE EXCISION AND OPEN ROTATOR CUFF REPAIR  2006  . SHOULDER ARTHROSCOPY W/ ROTATOR CUFF REPAIR  2006  . TOTAL KNEE ARTHROPLASTY Right 02/18/2014   Procedure: TOTAL KNEE ARTHROPLASTY;  Surgeon: Lorn Junes, MD;  Location: [facility];  Service: Orthopedics;  Laterality: Right;    Social  History   Tobacco Use  . Smoking status: Never Smoker  . Smokeless tobacco: Never Used  Substance Use Topics  . Alcohol use: Yes    Comment: 1-2 drinks weekly  . Drug use: No    Family History  Problem Relation Age of Onset  . Cancer Mother        lung  . Cancer Father        lung; + tobacco  . Heart disease Maternal Grandfather        heart attack  . Heart disease Paternal Grandfather        heart attack  . Diabetes Sister   . Hypertension Sister   . Hyperlipidemia Sister     Allergies  Allergen Reactions  .  Penicillins Swelling and Rash    Medication list has been reviewed and updated.  Current Outpatient Medications on File Prior to Visit  Medication Sig Dispense Refill  . albuterol (PROVENTIL HFA;VENTOLIN HFA) 108 (90 Base) MCG/ACT inhaler Inhale 2 puffs every 6 (six) hours as needed into the lungs for wheezing or shortness of breath. 1 Inhaler 0  . amitriptyline (ELAVIL) 25 MG tablet TAKE ONE TABLET AT BEDTIME. 90 tablet 1  . carvedilol (COREG) 3.125 MG tablet Take 3.125 mg by mouth at bedtime. TAKE ONE TABLET BY MOUTH DAILY AT BEDTIME    . lisinopril (PRINIVIL,ZESTRIL) 20 MG tablet TAKE 1 TABLET BY MOUTH DAILY. 90 tablet 1  . nitroGLYCERIN (NITROSTAT) 0.4 MG SL tablet Place 1 tablet (0.4 mg total) under the tongue every 5 (five) minutes as needed for chest pain. 14 tablet 0  . ondansetron (ZOFRAN ODT) 4 MG disintegrating tablet Take 1 tablet (4 mg total) by mouth every 8 (eight) hours as needed for nausea or vomiting. 20 tablet 0  . rosuvastatin (CRESTOR) 10 MG tablet Take 1 tablet (10 mg total) by mouth daily. 90 tablet 3  . sertraline (ZOLOFT) 100 MG tablet TAKE 2 & 1/2 TABLETS DAILY. 75 tablet 11  . traZODone (DESYREL) 50 MG tablet TAKE 1 OR 2 TABLETS AT BEDTIME. 180 tablet 3   No current facility-administered medications on file prior to visit.     Review of Systems:  As per HPI- otherwise negative. No fever or chills No CP or SOB She did feel nauseated  after dance recently    Physical Examination: Vitals:   01/04/18 0857  BP: 112/64  Pulse: 64  Resp: 16  Temp: 97.7 F (36.5 C)  SpO2: 99%   Vitals:   01/04/18 0857  Weight: 159 lb (72.1 kg)  Height: 5\' 4"  (1.626 m)   Body mass index is 27.29 kg/m. Ideal Body Weight: Weight in (lb) to have BMI = 25: 145.3  GEN: WDWN, NAD, Non-toxic, A & O x 3 HEENT: Atraumatic, Normocephalic. Neck supple. No masses, No LAD. Ears and Nose: No external deformity. CV: RRR, No M/G/R. No JVD. No thrill. No extra heart sounds. PULM: CTA B, no wheezes, crackles, rhonchi. No retractions. No resp. distress. No accessory muscle use. ABD: S, NT, ND, +BS. No rebound. No HSM. EXTR: No c/c/e NEURO Normal gait.  PSYCH: Normally interactive. Conversant. Not depressed or anxious appearing.  Calm demeanor.    Assessment and Plan: Concussion without loss of consciousness, subsequent encounter  Need for influenza vaccination - Plan: Flu vaccine HIGH DOSE PF (Fluzone High dose)  Screening for breast cancer - Plan: MM 3D SCREEN BREAST BILATERAL  Following up from recent concussion She had a CT of her head and neck at the ER Overall she is getting better- went over symptoms of concussion.  If she experiences these she should slow down her activity level She will let me know if not back to normal in the next few weeks Flu shot given today mammo ordered for her   Signed Lamar Blinks, MD

## 2018-01-04 ENCOUNTER — Ambulatory Visit (INDEPENDENT_AMBULATORY_CARE_PROVIDER_SITE_OTHER): Payer: Medicare Other | Admitting: Family Medicine

## 2018-01-04 ENCOUNTER — Encounter: Payer: Self-pay | Admitting: Family Medicine

## 2018-01-04 VITALS — BP 112/64 | HR 64 | Temp 97.7°F | Resp 16 | Ht 64.0 in | Wt 159.0 lb

## 2018-01-04 DIAGNOSIS — Z23 Encounter for immunization: Secondary | ICD-10-CM | POA: Diagnosis not present

## 2018-01-04 DIAGNOSIS — Z1231 Encounter for screening mammogram for malignant neoplasm of breast: Secondary | ICD-10-CM

## 2018-01-04 DIAGNOSIS — Z1239 Encounter for other screening for malignant neoplasm of breast: Secondary | ICD-10-CM

## 2018-01-04 DIAGNOSIS — S060X0D Concussion without loss of consciousness, subsequent encounter: Secondary | ICD-10-CM | POA: Diagnosis not present

## 2018-01-04 NOTE — Patient Instructions (Signed)
I agree that you seem to be getting better form your recent concussion. Ok to do your normal activities- however, if you feel very tired, sick, nauseated, etc then you are doing too much and need to slow down Please let me know if not back to quite normal in the next few weeks Flu shot given today I ordered a mammogram for you

## 2018-01-31 ENCOUNTER — Encounter: Payer: Self-pay | Admitting: Family Medicine

## 2018-01-31 DIAGNOSIS — I10 Essential (primary) hypertension: Secondary | ICD-10-CM

## 2018-01-31 MED ORDER — LISINOPRIL 20 MG PO TABS: 20.0000 mg | ORAL_TABLET | Freq: Every day | ORAL | 3 refills | Status: DC

## 2018-02-13 ENCOUNTER — Ambulatory Visit
Admission: RE | Admit: 2018-02-13 | Discharge: 2018-02-13 | Disposition: A | Discharge status: Home / Self Care | Payer: Medicare Other | Source: Ambulatory Visit | Attending: Family Medicine | Admitting: Family Medicine

## 2018-02-13 DIAGNOSIS — Z1239 Encounter for other screening for malignant neoplasm of breast: Secondary | ICD-10-CM

## 2018-02-13 DIAGNOSIS — Z1231 Encounter for screening mammogram for malignant neoplasm of breast: Secondary | ICD-10-CM | POA: Diagnosis not present

## 2018-03-11 ENCOUNTER — Other Ambulatory Visit: Payer: Self-pay | Admitting: Family Medicine

## 2018-03-11 DIAGNOSIS — G609 Hereditary and idiopathic neuropathy, unspecified: Secondary | ICD-10-CM

## 2018-06-22 ENCOUNTER — Other Ambulatory Visit: Payer: Self-pay | Admitting: Family Medicine

## 2018-06-22 DIAGNOSIS — F321 Major depressive disorder, single episode, moderate: Secondary | ICD-10-CM

## 2018-06-27 ENCOUNTER — Other Ambulatory Visit: Payer: Self-pay | Admitting: Family Medicine

## 2018-07-17 ENCOUNTER — Encounter: Payer: Self-pay | Admitting: *Deleted

## 2018-07-17 ENCOUNTER — Ambulatory Visit (INDEPENDENT_AMBULATORY_CARE_PROVIDER_SITE_OTHER): Payer: Medicare Other | Admitting: *Deleted

## 2018-07-17 ENCOUNTER — Other Ambulatory Visit: Payer: Self-pay

## 2018-07-17 DIAGNOSIS — Z0000 Encounter for general adult medical examination without abnormal findings: Secondary | ICD-10-CM | POA: Diagnosis not present

## 2018-07-17 NOTE — Patient Instructions (Signed)
Please schedule your next medicare wellness visit with me in 1 yr.  Continue to eat heart healthy diet (full of fruits, vegetables, whole grains, lean protein, water--limit salt, fat, and sugar intake) and increase physical activity as tolerated.  Continue doing brain stimulating activities (puzzles, reading, adult coloring books, staying active) to keep memory sharp.   Bring a copy of your living will and/or healthcare power of attorney to your next office visit.   Monica Kane , Thank you for taking time to come for your Medicare Wellness Visit. I appreciate your ongoing commitment to your health goals. Please review the following plan we discussed and let me know if I can assist you in the future.   These are the goals we discussed: Goals    . DIET - INCREASE WATER INTAKE    . Maintain current healthy lifestyle.       This is a list of the screening recommended for you and due dates:  Health Maintenance  Topic Date Due  . Tetanus Vaccine  02/17/2018  . Colon Cancer Screening  06/06/2018  . Mammogram  02/14/2020  . Flu Shot  Completed  . DEXA scan (bone density measurement)  Completed  .  Hepatitis C: One time screening is recommended by Center for Disease Control  (CDC) for  adults born from [birth year] through 1963.   Completed  . Pneumonia vaccines  Completed    Health Maintenance After Age 81 After age 42, you are at a higher risk for certain long-term diseases and infections as well as injuries from falls. Falls are a major cause of broken bones and head injuries in people who are older than age 45. Getting regular preventive care can help to keep you healthy and well. Preventive care includes getting regular testing and making lifestyle changes as recommended by your health care provider. Talk with your health care provider about:  Which screenings and tests you should have. A screening is a test that checks for a disease when you have no symptoms.  A diet and exercise plan that is  right for you. What should I know about screenings and tests to prevent falls? Screening and testing are the best ways to find a health problem early. Early diagnosis and treatment give you the best chance of managing medical conditions that are common after age 59. Certain conditions and lifestyle choices may make you more likely to have a fall. Your health care provider may recommend:  Regular vision checks. Poor vision and conditions such as cataracts can make you more likely to have a fall. If you wear glasses, make sure to get your prescription updated if your vision changes.  Medicine review. Work with your health care provider to regularly review all of the medicines you are taking, including over-the-counter medicines. Ask your health care provider about any side effects that may make you more likely to have a fall. Tell your health care provider if any medicines that you take make you feel dizzy or sleepy.  Osteoporosis screening. Osteoporosis is a condition that causes the bones to get weaker. This can make the bones weak and cause them to break more easily.  Blood pressure screening. Blood pressure changes and medicines to control blood pressure can make you feel dizzy.  Strength and balance checks. Your health care provider may recommend certain tests to check your strength and balance while standing, walking, or changing positions.  Foot health exam. Foot pain and numbness, as well as not wearing proper footwear, can  make you more likely to have a fall.  Depression screening. You may be more likely to have a fall if you have a fear of falling, feel emotionally low, or feel unable to do activities that you used to do.  Alcohol use screening. Using too much alcohol can affect your balance and may make you more likely to have a fall. What actions can I take to lower my risk of falls? General instructions  Talk with your health care provider about your risks for falling. Tell your  health care provider if: ? You fall. Be sure to tell your health care provider about all falls, even ones that seem minor. ? You feel dizzy, sleepy, or off-balance.  Take over-the-counter and prescription medicines only as told by your health care provider. These include any supplements.  Eat a healthy diet and maintain a healthy weight. A healthy diet includes low-fat dairy products, low-fat (lean) meats, and fiber from whole grains, beans, and lots of fruits and vegetables. Home safety  Remove any tripping hazards, such as rugs, cords, and clutter.  Install safety equipment such as grab bars in bathrooms and safety rails on stairs.  Keep rooms and walkways well-lit. Activity   Follow a regular exercise program to stay fit. This will help you maintain your balance. Ask your health care provider what types of exercise are appropriate for you.  If you need a cane or walker, use it as recommended by your health care provider.  Wear supportive shoes that have nonskid soles. Lifestyle  Do not drink alcohol if your health care provider tells you not to drink.  If you drink alcohol, limit how much you have: ? 0-1 drink a day for women. ? 0-2 drinks a day for men.  Be aware of how much alcohol is in your drink. In the [place] one drink equals one typical bottle of beer (12 oz), one-half glass of wine (5 oz), or one shot of hard liquor (1 oz).  Do not use any products that contain nicotine or tobacco, such as cigarettes and e-cigarettes. If you need help quitting, ask your health care provider. Summary  Having a healthy lifestyle and getting preventive care can help to protect your health and wellness after age 48.  Screening and testing are the best way to find a health problem early and help you avoid having a fall. Early diagnosis and treatment give you the best chance for managing medical conditions that are more common for people who are older than age 55.  Falls are a major cause  of broken bones and head injuries in people who are older than age 29. Take precautions to prevent a fall at home.  Work with your health care provider to learn what changes you can make to improve your health and wellness and to prevent falls. This information is not intended to replace advice given to you by your health care provider. Make sure you discuss any questions you have with your health care provider. Document Released: 02/16/2017 Document Revised: 02/16/2017 Document Reviewed: 02/16/2017 Elsevier Interactive Patient Education  2019 [employer].

## 2018-07-17 NOTE — Progress Notes (Signed)
I connected with Meghann on 07/17/18 at  3:00 PM EDT by a video enabled telemedicine application and verified that I am speaking with the correct person using two identifiers.   Subjective:   Monica Kane is a 73 y.o. female who presents for Medicare Annual (Subsequent) preventive examination.  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 Enjoys reading and crosswords.   Review of Systems: No ROS.  Medicare Wellness Visit. Additional risk factors are reflected in the social history. Cardiac Risk Factors include: advanced age (>11men, >35 women);dyslipidemia;hypertension Sleep patterns: Takes Trazodone and Elavil at bedtime. Generally sleeps well.  Home Safety/Smoke Alarms: Feels safe in home. Smoke alarms in place.  Lives in 2 story home with husband. Step over tub.   Female:      Mammo- 10.29.19      Dexa scan- Will schedule after Covid 19 passes       CCS-2.18.15.     Objective:     Vitals: UTA virtual visit/ covid 19  Advanced Directives 07/17/2018 12/02/2017 08/12/2016 02/08/2014 01/17/2014 07/23/2013 02/26/2011  Does Patient Have a Medical Advance Directive? Yes Yes Yes Yes Yes Patient does not have advance directive Patient has advance directive, copy not in chart  Type of Advance Directive [place];Living will [place];Living will [place];Living will [place];Living will [place];Living will - -  Does patient want to make changes to medical advance directive? No - Patient declined - - No - Patient declined - - -  Copy of [place] in Chart? No - copy requested - No - copy requested No - copy requested No - copy requested - -  Pre-existing out of facility DNR order (yellow form or pink MOST form) - - - - - No -    Tobacco Social History   Tobacco Use  Smoking Status Never Smoker  Smokeless Tobacco Never Used     Counseling given: Not Answered    Clinical Intake:  Pain : No/denies pain   Past Medical History:  Diagnosis Date  . Anxiety    takes Ativan nightly  . Depression    takes Zoloft daily  . History of colon polyps   . History of kidney stones   . History of staph infection 11/2013  . Hyperlipidemia    takes Crestor nightly   . Hypertension    takes Lisinopril and Coreg daily  . Joint pain   . Joint swelling   . Neuropathy    takes Elavil nightly  . Pneumonia   . Primary localized osteoarthritis of right knee    Past Surgical History:  Procedure Laterality Date  . ABDOMINAL HYSTERECTOMY  1989  . APPENDECTOMY    . CARDIAC CATHETERIZATION  2015  . CERVICAL SPINE SURGERY  2004   C 5-6  . COLONOSCOPY    . colonscopy    . DIAGNOSTIC LAPAROSCOPY  1976   fibroid removed  . ELBOW ARTHROSCOPY  2010, 2012  . EYE SURGERY Bilateral    cataract  . FOOT SURGERY Left 2013  . KNEE ARTHROSCOPY  Oct 2014  . KNEE ARTHROSCOPY W/ MENISCECTOMY Right 01/31/2013   [facility]  . LATERAL EPICONDYLE RELEASE  03/01/2011   Procedure: TENNIS ELBOW RELEASE;  Surgeon: Lorn Junes, MD;  Location: [facility];  Service: Orthopedics;  Laterality: Left;  TENOTOMY ELBOW LATERAL EPICONDYLITIS TENNIS ELBOW  . lateral release right elbow  2010  . left foot surgery  2013   mortons neuroma  . LEFT HEART CATHETERIZATION WITH CORONARY ANGIOGRAM N/A 07/23/2013   Procedure: LEFT HEART CATHETERIZATION WITH CORONARY ANGIOGRAM;  Surgeon: Sinclair Grooms, MD;  Location: [facility] C[facility] LAB;  Service: Cardiovascular;  Laterality: N/A;  . PAROTID GLAND TUMOR EXCISION  1976   benign  . PAROTID GLAND TUMOR EXCISION  1976   rt  . SHOULDER ARTHROSCOPY DISTAL CLAVICLE EXCISION AND OPEN ROTATOR CUFF REPAIR  2006  . SHOULDER ARTHROSCOPY W/ ROTATOR CUFF REPAIR  2006  . TOTAL KNEE ARTHROPLASTY Right 02/18/2014   Procedure: TOTAL KNEE ARTHROPLASTY;  Surgeon: Lorn Junes, MD;  Location: [facility];  Service: Orthopedics;  Laterality: Right;   Family  History  Problem Relation Age of Onset  . Cancer Mother        lung  . Cancer Father        lung; + tobacco  . Heart disease Maternal Grandfather        heart attack  . Heart disease Paternal Grandfather        heart attack  . Diabetes Sister   . Hypertension Sister   . Hyperlipidemia Sister   . Breast cancer Neg Hx    Social History   Socioeconomic History  . Marital status: Married    Spouse name: Sorrel Cassetta  . Number of children: Not on file  . Years of education: Not on file  . Highest education level: Not on file  Occupational History  . Not on file  Social Needs  . Financial resource strain: Not on file  . Food insecurity:    Worry: Not on file    Inability: Not on file  . Transportation needs:    Medical: Not on file    Non-medical: Not on file  Tobacco Use  . Smoking status: Never Smoker  . Smokeless tobacco: Never Used  Substance and Sexual Activity  . Alcohol use: Yes    Comment: 1-2 drinks weekly  . Drug use: No  . Sexual activity: Yes    Birth control/protection: Surgical  Lifestyle  . Physical activity:    Days per week: Not on file    Minutes per session: Not on file  . Stress: Not on file  Relationships  . Social connections:    Talks on phone: Not on file    Gets together: Not on file    Attends religious service: Not on file    Active member of club or organization: Not on file    Attends meetings of clubs or organizations: Not on file    Relationship status: Not on file  Other Topics Concern  . Not on file  Social History Narrative   Lives with her husband.  He has 2 adult children. Patient has a Financial risk analyst, and she does exercise.    Outpatient Encounter Medications as of 07/17/2018  Medication Sig  . albuterol (PROVENTIL HFA;VENTOLIN HFA) 108 (90 Base) MCG/ACT inhaler Inhale 2 puffs every 6 (six) hours as needed into the lungs for wheezing or shortness of breath.  Marland Kitchen amitriptyline (ELAVIL) 25 MG tablet TAKE ONE TABLET AT  BEDTIME.  . carvedilol (COREG) 3.125 MG tablet TAKE 1 TABLET BY MOUTH TWICE DAILY WITH A MEAL. (Patient taking differently: at bedtime. )  . lisinopril (PRINIVIL,ZESTRIL) 20 MG tablet Take 1 tablet (20 mg total) by mouth daily.  . rosuvastatin (CRESTOR) 10 MG tablet Take 1 tablet (10 mg total) by mouth daily.  . sertraline (ZOLOFT) 100 MG tablet TAKE 2 &  1/2 TABLETS DAILY.  . traZODone (DESYREL) 50 MG tablet TAKE 1 OR 2 TABLETS AT BEDTIME.  . nitroGLYCERIN (NITROSTAT) 0.4 MG SL tablet Place 1 tablet (0.4 mg total) under the tongue every 5 (five) minutes as needed for chest pain. (Patient not taking: Reported on 07/17/2018)  . ondansetron (ZOFRAN ODT) 4 MG disintegrating tablet Take 1 tablet (4 mg total) by mouth every 8 (eight) hours as needed for nausea or vomiting. (Patient not taking: Reported on 07/17/2018)   No facility-administered encounter medications on file as of 07/17/2018.     Activities of Daily Living In your present state of health, do you have any difficulty performing the following activities: 07/17/2018 10/31/2017  Hearing? N N  Vision? N N  Difficulty concentrating or making decisions? N N  Walking or climbing stairs? N N  Dressing or bathing? N N  Doing errands, shopping? N N  Preparing Food and eating ? N -  Using the Toilet? N -  In the past six months, have you accidently leaked urine? N -  Do you have problems with loss of bowel control? N -  Managing your Medications? N -  Managing your Finances? N -  Housekeeping or managing your Housekeeping? N -  Some recent data might be hidden    Patient Care Team: Copland, Gay Filler, MD as PCP - General (Family Medicine)    Assessment:   This is a routine wellness examination for Arlena. Physical assessment deferred to PCP.  Exercise Activities and Dietary recommendations Current Exercise Habits: Home exercise routine, Type of exercise: Other - see comments(dance), Time (Minutes): 40, Intensity: Mild, Exercise limited by:  None identified Diet (meal preparation, eat out, water intake, caffeinated beverages, dairy products, fruits and vegetables): in general, a "healthy" diet  , well balanced, on average, 3 meals per day   Goals    . DIET - INCREASE WATER INTAKE    . Maintain current healthy lifestyle.       Fall Risk Fall Risk  07/17/2018 01/04/2018 08/12/2016 09/10/2015 01/22/2015  Falls in the past year? 1 Yes No No No  Number falls in past yr: 0 1 - - -  Injury with Fall? 1 Yes - - -     Depression Screen PHQ 2/9 Scores 07/17/2018 08/12/2016 09/10/2015 06/03/2015  PHQ - 2 Score 0 0 0 0  Exception Documentation - - Patient refusal -     Cognitive Function Ad8 score reviewed for issues:  Issues making decisions:no  Less interest in hobbies / activities:no  Repeats questions, stories (family complaining):no  Trouble using ordinary gadgets (microwave, computer, phone):no  Forgets the month or year: no  Mismanaging finances: no  Remembering appts:no  Daily problems with thinking and/or memory:no Ad8 score is=0        Immunization History  Administered Date(s) Administered  . Influenza, High Dose Seasonal PF 01/04/2018  . Influenza,inj,Quad PF,6+ Mos 01/02/2014, 01/22/2015  . Pneumococcal Conjugate-13 01/22/2015  . Pneumococcal Polysaccharide-23 03/23/2013  . Td 02/18/2008   Screening Tests Health Maintenance  Topic Date Due  . TETANUS/TDAP  02/17/2018  . COLONOSCOPY  06/06/2018  . MAMMOGRAM  02/14/2020  . INFLUENZA VACCINE  Completed  . DEXA SCAN  Completed  . Hepatitis C Screening  Completed  . PNA vac Low Risk Adult  Completed      Plan:     Please schedule your next medicare wellness visit with me in 1 yr.  Continue to eat heart healthy diet (full of fruits, vegetables, whole grains,  lean protein, water--limit salt, fat, and sugar intake) and increase physical activity as tolerated.  Continue doing brain stimulating activities (puzzles, reading, adult coloring books,  staying active) to keep memory sharp.   Bring a copy of your living will and/or healthcare power of attorney to your next office visit.    I have personally reviewed and noted the following in the patient's chart:   . Medical and social history . Use of alcohol, tobacco or illicit drugs  . Current medications and supplements . Functional ability and status . Nutritional status . Physical activity . Advanced directives . List of other physicians . Hospitalizations, surgeries, and ER visits in previous 12 months . Vitals . Screenings to include cognitive, depression, and falls . Referrals and appointments  In addition, I have reviewed and discussed with patient certain preventive protocols, quality metrics, and best practice recommendations. A written personalized care plan for preventive services as well as general preventive health recommendations were provided to patient.     Shela Nevin, [place]  07/17/2018

## 2018-07-17 NOTE — Progress Notes (Signed)
I have reviewed the Monica Kane by Ms. Britt and agree with her documentation

## 2018-09-25 ENCOUNTER — Encounter: Payer: Self-pay | Admitting: Family Medicine

## 2018-09-25 ENCOUNTER — Other Ambulatory Visit: Payer: Self-pay

## 2018-09-25 ENCOUNTER — Other Ambulatory Visit: Payer: Self-pay | Admitting: Family Medicine

## 2018-09-25 ENCOUNTER — Emergency Department (HOSPITAL_COMMUNITY)
Admission: EM | Admit: 2018-09-25 | Discharge: 2018-09-26 | Disposition: A | Discharge status: Home / Self Care | Payer: Medicare Other | Attending: Emergency Medicine | Admitting: Emergency Medicine

## 2018-09-25 ENCOUNTER — Encounter (HOSPITAL_COMMUNITY): Payer: Self-pay | Admitting: *Deleted

## 2018-09-25 ENCOUNTER — Ambulatory Visit (INDEPENDENT_AMBULATORY_CARE_PROVIDER_SITE_OTHER): Payer: Medicare Other | Admitting: Family Medicine

## 2018-09-25 ENCOUNTER — Emergency Department (HOSPITAL_COMMUNITY): Payer: Medicare Other

## 2018-09-25 VITALS — BP 140/70 | HR 68 | Temp 98.2°F | Resp 16 | Ht 64.0 in | Wt 164.0 lb

## 2018-09-25 DIAGNOSIS — R4182 Altered mental status, unspecified: Secondary | ICD-10-CM

## 2018-09-25 DIAGNOSIS — R531 Weakness: Secondary | ICD-10-CM

## 2018-09-25 DIAGNOSIS — R4789 Other speech disturbances: Secondary | ICD-10-CM

## 2018-09-25 DIAGNOSIS — R296 Repeated falls: Secondary | ICD-10-CM | POA: Diagnosis not present

## 2018-09-25 DIAGNOSIS — Z79899 Other long term (current) drug therapy: Secondary | ICD-10-CM | POA: Insufficient documentation

## 2018-09-25 DIAGNOSIS — I10 Essential (primary) hypertension: Secondary | ICD-10-CM | POA: Insufficient documentation

## 2018-09-25 DIAGNOSIS — R479 Unspecified speech disturbances: Secondary | ICD-10-CM | POA: Diagnosis not present

## 2018-09-25 DIAGNOSIS — G609 Hereditary and idiopathic neuropathy, unspecified: Secondary | ICD-10-CM

## 2018-09-25 LAB — CBG MONITORING, ED: Glucose-Capillary: 91 mg/dL (ref 70–99)

## 2018-09-25 LAB — DIFFERENTIAL
Abs Immature Granulocytes: 0.01 10*3/uL (ref 0.00–0.07)
Basophils Absolute: 0 10*3/uL (ref 0.0–0.1)
Basophils Relative: 1 %
Eosinophils Absolute: 0.1 10*3/uL (ref 0.0–0.5)
Eosinophils Relative: 2 %
Immature Granulocytes: 0 %
Lymphocytes Relative: 28 %
Lymphs Abs: 1.5 10*3/uL (ref 0.7–4.0)
Monocytes Absolute: 0.4 10*3/uL (ref 0.1–1.0)
Monocytes Relative: 7 %
Neutro Abs: 3.3 10*3/uL (ref 1.7–7.7)
Neutrophils Relative %: 62 %

## 2018-09-25 LAB — APTT: aPTT: 26 seconds (ref 24–36)

## 2018-09-25 LAB — COMPREHENSIVE METABOLIC PANEL
ALT: 17 U/L (ref 0–44)
AST: 20 U/L (ref 15–41)
Albumin: 4 g/dL (ref 3.5–5.0)
Alkaline Phosphatase: 79 U/L (ref 38–126)
Anion gap: 9 (ref 5–15)
BUN: 11 mg/dL (ref 8–23)
CO2: 25 mmol/L (ref 22–32)
Calcium: 9.4 mg/dL (ref 8.9–10.3)
Chloride: 107 mmol/L (ref 98–111)
Creatinine, Ser: 0.88 mg/dL (ref 0.44–1.00)
GFR calc Af Amer: >60 mL/min (ref >60)
GFR calc non Af Amer: >60 mL/min (ref >60)
Glucose, Bld: 106 mg/dL — ABNORMAL HIGH (ref 70–99)
Potassium: 4 mmol/L (ref 3.5–5.1)
Sodium: 141 mmol/L (ref 135–145)
Total Bilirubin: 1 mg/dL (ref 0.3–1.2)
Total Protein: 6.5 g/dL (ref 6.5–8.1)

## 2018-09-25 LAB — CBC
HCT: 40.6 % (ref 36.0–46.0)
Hemoglobin: 12.9 g/dL (ref 12.0–15.0)
MCH: 26.3 pg (ref 26.0–34.0)
MCHC: 31.8 g/dL (ref 30.0–36.0)
MCV: 82.9 fL (ref 80.0–100.0)
Platelets: 178 10*3/uL (ref 150–400)
RBC: 4.9 MIL/uL (ref 3.87–5.11)
RDW: 13.4 % (ref 11.5–15.5)
WBC: 5.3 10*3/uL (ref 4.0–10.5)
nRBC: 0 % (ref 0.0–0.2)

## 2018-09-25 LAB — I-STAT CHEM 8, ED
BUN: 13 mg/dL (ref 8–23)
Calcium, Ion: 1.26 mmol/L (ref 1.15–1.40)
Chloride: 105 mmol/L (ref 98–111)
Creatinine, Ser: 0.8 mg/dL (ref 0.44–1.00)
Glucose, Bld: 102 mg/dL — ABNORMAL HIGH (ref 70–99)
HCT: 40 % (ref 36.0–46.0)
Hemoglobin: 13.6 g/dL (ref 12.0–15.0)
Potassium: 4.1 mmol/L (ref 3.5–5.1)
Sodium: 140 mmol/L (ref 135–145)
TCO2: 25 mmol/L (ref 22–32)

## 2018-09-25 LAB — PROTIME-INR
INR: 1.1 (ref 0.8–1.2)
Prothrombin Time: 13.6 seconds (ref 11.4–15.2)

## 2018-09-25 MED ORDER — SODIUM CHLORIDE 0.9% FLUSH: 3.0000 mL | Freq: Once | INTRAVENOUS | Status: DC

## 2018-09-25 NOTE — ED Triage Notes (Signed)
Pt in c/o gradual onset over the course of several months, c/o increased weakness, falls, trouble with word finding and feeling foggy, alert and oriented, no distress noted
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
## 2018-09-25 NOTE — ED Provider Notes (Signed)
[facility] [facility] EMERGENCY DEPARTMENT Provider Note   CSN: [id number] Arrival date & time: 09/25/18  1527  History   Chief Complaint Chief Complaint  Patient presents with   Weakness   HPI Monica Kane is a 73 y.o. female with past medical history significant for prediabetes, anxiety, hypertension, hyperlipidemia who presents for evaluation of multiple symptoms.  Patient states she has had gradual onset of increased overall weakness as well as multiple falls.  Patient states she she has had episodes of trouble with word finding.  States on certain occasions she has had slurred speech.  States at that time she did not have unilateral weakness.  States most recent fall which occurred 10 days ago.  She was walking with her great niece when she developed sudden dizziness.  Patient states she thought she just had a dizzy however she was actually moving her body around in circles.  Has not any episodes of syncope. She was seen by PCP today and sent to the ED for MR. Denies fever, chills, N/V, CP , SOB, sudden onset thunderclap HA, unilateral weakness.   History obtained from patient and prior medical records.. No interpretor was used.     HPI  Past Medical History:  Diagnosis Date   Anxiety    takes Ativan nightly   Depression    takes Zoloft daily   History of colon polyps    History of kidney stones    History of staph infection 11/2013   Hyperlipidemia    takes Crestor nightly    Hypertension    takes Lisinopril and Coreg daily   Joint pain    Joint swelling    Neuropathy    takes Elavil nightly   Pneumonia    Primary localized osteoarthritis of right knee     Patient Active Problem List   Diagnosis Date Noted   Pre-diabetes 06/05/2015   Primary localized osteoarthritis of right knee    Anxiety    Chest pain, midsternal 07/23/2013   Osteopenia 03/23/2013   Kidney stones, calcium oxalate 03/23/2013   Hypertension 03/31/2011    Hyperlipidemia 03/31/2011   Vitamin D deficiency 03/31/2011   Hallux rigidus 03/24/2011   Metatarsalgia of left foot 03/24/2011   Lateral epicondylitis of left elbow 02/24/2011   Migraine 02/24/2011   Depression 02/24/2011   Arthritis 02/24/2011    Past Surgical History:  Procedure Laterality Date   [place]  2015   CERVICAL SPINE SURGERY  2004   C 5-6   COLONOSCOPY     colonscopy     DIAGNOSTIC LAPAROSCOPY  1976   fibroid removed   ELBOW ARTHROSCOPY  2010, 2012   EYE SURGERY Bilateral    cataract   FOOT SURGERY Left 2013   KNEE ARTHROSCOPY  Oct 2014   KNEE ARTHROSCOPY W/ MENISCECTOMY Right 01/31/2013   [place]   LATERAL EPICONDYLE RELEASE  03/01/2011   Procedure: TENNIS ELBOW RELEASE;  Surgeon: Lorn Junes, MD;  Location: [place];  Service: Orthopedics;  Laterality: Left;  TENOTOMY ELBOW LATERAL EPICONDYLITIS TENNIS ELBOW   lateral release right elbow  2010   left foot surgery  2013   mortons neuroma   LEFT HEART CATHETERIZATION WITH CORONARY ANGIOGRAM N/A 07/23/2013   Procedure: LEFT HEART CATHETERIZATION WITH CORONARY ANGIOGRAM;  Surgeon: Sinclair Grooms, MD;  Location: [facility] CATH LAB;  Service: Cardiovascular;  Laterality: N/A;   [place]  benign   PAROTID GLAND TUMOR EXCISION  1976   rt   SHOULDER ARTHROSCOPY DISTAL CLAVICLE EXCISION AND OPEN ROTATOR CUFF REPAIR  2006   SHOULDER ARTHROSCOPY W/ ROTATOR CUFF REPAIR  2006   TOTAL KNEE ARTHROPLASTY Right 02/18/2014   Procedure: TOTAL KNEE ARTHROPLASTY;  Surgeon: Lorn Junes, MD;  Location: [place];  Service: Orthopedics;  Laterality: Right;     OB History   No obstetric history on file.      Home Medications    Prior to Admission medications   Medication Sig Start Date End Date Taking? Authorizing Provider  albuterol (PROVENTIL HFA;VENTOLIN HFA) 108 (90 Base) MCG/ACT inhaler  Inhale 2 puffs every 6 (six) hours as needed into the lungs for wheezing or shortness of breath. 03/07/17   Copland, Gay Filler, MD  amitriptyline (ELAVIL) 25 MG tablet TAKE ONE TABLET AT BEDTIME. 03/13/18   Copland, Gay Filler, MD  carvedilol (COREG) 3.125 MG tablet TAKE 1 TABLET BY MOUTH TWICE DAILY WITH A MEAL. Patient taking differently: at bedtime.  06/27/18   Copland, Gay Filler, MD  lisinopril (PRINIVIL,ZESTRIL) 20 MG tablet Take 1 tablet (20 mg total) by mouth daily. 01/31/18   Copland, Gay Filler, MD  ondansetron (ZOFRAN ODT) 4 MG disintegrating tablet Take 1 tablet (4 mg total) by mouth every 8 (eight) hours as needed for nausea or vomiting. 12/02/17   McDonald, Mia A, PA-C  rosuvastatin (CRESTOR) 10 MG tablet Take 1 tablet (10 mg total) by mouth daily. 10/31/17   Copland, Gay Filler, MD  sertraline (ZOLOFT) 100 MG tablet TAKE 2 & 1/2 TABLETS DAILY. 06/22/18   Copland, Gay Filler, MD  traZODone (DESYREL) 50 MG tablet TAKE 1 OR 2 TABLETS AT BEDTIME. 08/18/17   Copland, Gay Filler, MD    Family History Family History  Problem Relation Age of Onset   Cancer Mother        lung   Cancer Father        lung; + tobacco   Heart disease Maternal Grandfather        heart attack   Heart disease Paternal Grandfather        heart attack   Diabetes Sister    Hypertension Sister    Hyperlipidemia Sister    Breast cancer Neg Hx     Social History Social History   Tobacco Use   Smoking status: Never Smoker   Smokeless tobacco: Never Used  Substance Use Topics   Alcohol use: Yes    Comment: 1-2 drinks weekly   Drug use: No     Allergies   Penicillins   Review of Systems Review of Systems  Constitutional: Negative.   HENT: Negative.   Respiratory: Negative.   Cardiovascular: Negative.   Gastrointestinal: Negative.   Genitourinary: Negative.   Musculoskeletal: Negative.   Skin: Negative.   Neurological: Positive for dizziness, speech difficulty, weakness and  light-headedness. Negative for tremors, seizures, syncope, facial asymmetry, numbness and headaches.  All other systems reviewed and are negative.    Physical Exam Updated Vital Signs BP (!) 147/73    Pulse 60    Temp 98 F (36.7 C) (Oral)    Resp 15    SpO2 96%   Physical Exam  Physical Exam  Constitutional: Pt is oriented to person, place, and time. Pt appears well-developed and well-nourished. No distress.  HENT:  Head: Normocephalic and atraumatic.  Mouth/Throat: Oropharynx is clear and moist.  Eyes: Conjunctivae and EOM are normal. Pupils are equal, round, and reactive  to light. No scleral icterus.  No horizontal, vertical or rotational nystagmus  Neck: Normal range of motion. Neck supple.  Full active and passive ROM without pain No midline or paraspinal tenderness No nuchal rigidity or meningeal signs  Cardiovascular: Normal rate, regular rhythm and intact distal pulses.   Pulmonary/Chest: Effort normal and breath sounds normal. No respiratory distress. Pt has no wheezes. No rales.  Abdominal: Soft. Bowel sounds are normal. There is no tenderness. There is no rebound and no guarding.  Musculoskeletal: Normal range of motion.  Lymphadenopathy:    No cervical adenopathy.  Neurological: Pt. is alert and oriented to person, place, and time. He has normal reflexes. No cranial nerve deficit.  Exhibits normal muscle tone. Coordination normal.  Mental Status:  Alert, oriented, thought content appropriate. Speech fluent without evidence of aphasia. Able to follow 2 step commands without difficulty.  Cranial Nerves:  II:  Peripheral visual fields grossly normal, pupils equal, round, reactive to light III,IV, VI: ptosis not present, extra-ocular motions intact bilaterally  V,VII: smile symmetric, facial light touch sensation equal VIII: hearing grossly normal bilaterally  IX,X: midline uvula rise  XI: bilateral shoulder shrug equal and strong XII: midline tongue extension  Motor:    5/5 in upper and lower extremities bilaterally including strong and equal grip strength and dorsiflexion/plantar flexion Sensory: Pinprick and light touch normal in all extremities.  Deep Tendon Reflexes: 2+ and symmetric  Cerebellar: normal finger-to-nose with bilateral upper extremities Gait: normal gait and balance CV: distal pulses palpable throughout   Skin: Skin is warm and dry. No rash noted. Pt is not diaphoretic.  Psychiatric: Pt has a normal mood and affect. Behavior is normal. Judgment and thought content normal.  Nursing note and vitals reviewed.  ED Treatments / Results  Labs (all labs ordered are listed, but only abnormal results are displayed) Labs Reviewed  COMPREHENSIVE METABOLIC PANEL - Abnormal; Notable for the following components:      Result Value   Glucose, Bld 106 (*)    All other components within normal limits  URINALYSIS, ROUTINE W REFLEX MICROSCOPIC - Abnormal; Notable for the following components:   Ketones, ur 5 (*)    Leukocytes,Ua SMALL (*)    All other components within normal limits  I-STAT CHEM 8, ED - Abnormal; Notable for the following components:   Glucose, Bld 102 (*)    All other components within normal limits  PROTIME-INR  APTT  CBC  DIFFERENTIAL  RAPID URINE DRUG SCREEN, HOSP PERFORMED  CBG MONITORING, ED    EKG None  Radiology Ct Head Wo Contrast  Result Date: 09/25/2018 CLINICAL DATA:  Weakness, multiple falls. EXAM: CT HEAD WITHOUT CONTRAST TECHNIQUE: Contiguous axial images were obtained from the base of the skull through the vertex without intravenous contrast. COMPARISON:  CT scan of December 02, 2017. FINDINGS: Brain: Minimal diffuse cortical atrophy is noted. Mild chronic ischemic white matter disease is noted. No mass effect or midline shift is noted. Ventricular size is within normal limits. There is no evidence of mass lesion, hemorrhage or acute infarction. Vascular: No hyperdense vessel or unexpected calcification. Skull:  Normal. Negative for fracture or focal lesion. Sinuses/Orbits: No acute finding. Other: None. IMPRESSION: Minimal diffuse cortical atrophy. Mild chronic ischemic white matter disease. No acute intracranial abnormality seen. Electronically Signed   By: Marijo Conception M.D.   On: 09/25/2018 18:01   Mr Brain W And Wo Contrast  Result Date: 09/26/2018 CLINICAL DATA:  Initial evaluation for acute speech difficulty. EXAM:  MRI HEAD WITHOUT AND WITH CONTRAST TECHNIQUE: Multiplanar, multiecho pulse sequences of the brain and surrounding structures were obtained without and with intravenous contrast. CONTRAST:  7.5 cc of Gadavist. COMPARISON:  Prior CT from 09/25/2018 FINDINGS: Brain: Cerebral volume within normal limits for patient age. No focal parenchymal signal abnormality identified. No abnormal foci of restricted diffusion to suggest acute or subacute ischemia. Gray-white matter differentiation well maintained. No encephalomalacia to suggest chronic infarction. No foci of susceptibility artifact to suggest acute or chronic intracranial hemorrhage. No mass lesion, midline shift or mass effect. No hydrocephalus. No extra-axial fluid collection. Major dural sinuses are grossly patent. Pituitary gland and suprasellar region are normal. Midline structures intact and normal. No abnormal enhancement. Vascular: Major intracranial vascular flow voids well maintained and normal in appearance. Skull and upper cervical spine: Craniocervical junction normal. Visualized upper cervical spine within normal limits. Postsurgical changes partially visualized at C4. Bone marrow signal intensity normal. No scalp soft tissue abnormality. Sinuses/Orbits: Globes and orbital soft tissues within normal limits. Paranasal sinuses are clear. Trace right mastoid effusion, of doubtful significance. Inner ear structures normal. Other: Right parotid gland appears to be absent. IMPRESSION: Normal brain MRI.  No acute intracranial abnormality  identified. Electronically Signed   By: Jeannine Boga M.D.   On: 09/26/2018 02:20   Procedures Procedures (including critical care time)  Medications Ordered in ED Medications  sodium chloride flush (NS) 0.9 % injection 3 mL (has no administration in time range)  gadobutrol (GADAVIST) 1 MMOL/ML injection 7.5 mL (7.5 mLs Intravenous Contrast Given 09/26/18 0157)   Initial Impression / Assessment and Plan / ED Course  I have reviewed the triage vital signs and the nursing notes.  Pertinent labs & imaging results that were available during my care of the patient were reviewed by me and considered in my medical decision making (see chart for details).  73 year old female who appears otherwise well presents for evaluation of speech difficulty and recurrent falls x months. Seen by PCP today and sent to ED for MR brain. Normal neuro exam without deficits. Normal MSK exam. Heart and lungs clear. No carotid bruit or murmur.  Tolerating p.o. intake at home.  She denies aura prior to events or preceding CP, SOB or thunderclap HA. Denies syncopal episodes. Work up started in triage.  Previous medical records reviewed.  Labs and imaging personally reviewed.  Labs without acute findings. UDS negative Urinalysis negative CT head without acute findings MR brain W/WO negative  Low suspicion for ACS, CVA, TIA,  PE, CV syncope, central vertigo, encephalopathy, SAH, meningitis, troxidrome, electrolyte abnormality.  Will plan for consult with Neurology for additional recommendations.  Consulted with Dr.Lindzen, Neurology who recommends outpatient follow up.   Patient continues to be hemodynamically stable. Ambulatory without ataxic gait.   Patient with negative skew test and normal neurologic exam.  No vertical or rotational nystagmus. Patient normal finger-nose and normal gait.  No slurred speech renal or weakness.  Follow-up with neurology for reevaluation outpatient. Return for new or worsening  symptoms.  The patient has been appropriately medically screened and/or stabilized in the ED. I have low suspicion for any other emergent medical condition which would require further screening, evaluation or treatment in the ED or require inpatient management.  Patient is hemodynamically stable and in no acute distress.  Patient able to ambulate in department prior to ED.  Evaluation does not show acute pathology that would require ongoing or additional emergent interventions while in the emergency department or further  inpatient treatment.  I have discussed the diagnosis with the patient and answered all questions.  Patient has no further complaints prior to discharge.  Patient is comfortable with plan discussed in room and is stable for discharge at this time.  I have discussed strict return precautions for returning to the emergency department.  Patient was encouraged to follow-up with PCP/specialist refer to at discharge.      Patient has been seen and evaluated by my attending Dr. Wyvonnia Dusky who agrees with the above treatment, plan and disposition. Final Clinical Impressions(s) / ED Diagnoses   Final diagnoses:  Weakness  Word finding difficulty    ED Discharge Orders    None       Shae Hinnenkamp A, PA-C 09/26/18 0303    Ezequiel Essex, MD 09/27/18 0122

## 2018-09-25 NOTE — Patient Instructions (Signed)
I think you should be evaluated right away for the concerns you mentioned to me today.  Please go to the ER at [facility]- please DO NOT drive yourself!  They will likely get an MRI of your brain to help [place] figure out what might be wrong

## 2018-09-26 ENCOUNTER — Emergency Department (HOSPITAL_COMMUNITY): Payer: Medicare Other

## 2018-09-26 DIAGNOSIS — R479 Unspecified speech disturbances: Secondary | ICD-10-CM | POA: Diagnosis not present

## 2018-09-26 DIAGNOSIS — R531 Weakness: Secondary | ICD-10-CM | POA: Diagnosis not present

## 2018-09-26 LAB — URINALYSIS, ROUTINE W REFLEX MICROSCOPIC
Bacteria, UA: NONE SEEN
Bilirubin Urine: NEGATIVE
Glucose, UA: NEGATIVE mg/dL
Hgb urine dipstick: NEGATIVE
Ketones, ur: 5 mg/dL — AB
Nitrite: NEGATIVE
Protein, ur: NEGATIVE mg/dL
Specific Gravity, Urine: 1.02 (ref 1.005–1.030)
pH: 5 (ref 5.0–8.0)

## 2018-09-26 LAB — RAPID URINE DRUG SCREEN, HOSP PERFORMED
Amphetamines: NOT DETECTED
Barbiturates: NOT DETECTED
Benzodiazepines: NOT DETECTED
Cocaine: NOT DETECTED
Opiates: NOT DETECTED
Tetrahydrocannabinol: NOT DETECTED

## 2018-09-26 MED ORDER — GADOBUTROL 1 MMOL/ML IV SOLN
7.5000 mL | Freq: Once | INTRAVENOUS | Status: AC | PRN
  Administered 2018-09-26: 02:00:00 7.5 mL via INTRAVENOUS

## 2018-09-26 NOTE — Discharge Instructions (Signed)
Evaluated today for weakness and difficulty with word finding.  Your MRI, CT scan and blood work was unremarkable.  I have referred you to neurology.  Please follow-up.  Return to the ED for any new worsening symptoms

## 2018-09-27 ENCOUNTER — Ambulatory Visit (INDEPENDENT_AMBULATORY_CARE_PROVIDER_SITE_OTHER): Payer: Medicare Other | Admitting: Family Medicine

## 2018-09-27 ENCOUNTER — Encounter: Payer: Self-pay | Admitting: Family Medicine

## 2018-09-27 ENCOUNTER — Other Ambulatory Visit: Payer: Self-pay

## 2018-09-27 ENCOUNTER — Ambulatory Visit: Payer: Self-pay | Admitting: *Deleted

## 2018-09-27 DIAGNOSIS — R590 Localized enlarged lymph nodes: Secondary | ICD-10-CM | POA: Diagnosis not present

## 2018-09-27 MED ORDER — DOXYCYCLINE HYCLATE 100 MG PO CAPS: 100.0000 mg | ORAL_CAPSULE | Freq: Two times a day (BID) | ORAL | 0 refills | Status: DC

## 2018-09-27 NOTE — Progress Notes (Signed)
[place] at [facility] [address] [place], [place] [address] [phone]  Date:  09/27/2018   Name:  Monica Kane   DOB:  05-13-1945   MRN:  [id number]  PCP:  Darreld Mclean, MD    Chief Complaint: No chief complaint on file.   History of Present Illness:  Monica Kane is a 73 y.o. very pleasant female patient who presents for virtual visit today Pt location is home, provider location is office.  Pt ID confirmed today and she gives consent  Virtual visit today to discuss a problem with her neck-  See note from triage call today:  Pt called with complaints of swelling from her left ear that goes down her neck; she says that it is swollen; she wants Dr Lorelei Pont to know, and wonders if it could be a result of the tests that she had on Monday; the pt also wants to alert Dr Lorelei Pont to review the pictures that she sent via my chart; the pt also states that this morning the pain woke her up, and she rates at 3-4 out of 10; it is painful to chew or move her neck; recommendations made per nurse triage protocol; the pt verbalized understanding;she sees Dr Janett Billow Braydon Kullman, [place]; pt transferred to [facility] for scheduling.  We did an in person visit earlier this week- she was noted to have some mental status and neuro changes and I sent her to the ER for further evaluation Her ER evaluation - including CT of her head and MR brain- was normal.  Per patient, admission to the hospital was offered but she preferred outpatient follow-up. She will call neurology today and schedule her outpatient visit  She noted what she thinks is a swollen node in her neck last night.  It is tender and uncomfortable She feels like the external ear may be slightly swollen, and there is a tender bump behind the ear No fever or chills No difficulty breathing or swallowing, but it might hurt to swallow  No bite or other lesion visible on her skin  She is  otherwise feeling ok  No urinary sx   I reviewed the photo of her skin that she sent me via MyChart-I can appreciate a subtle area of erythema posterior to the ear  Patient Active Problem List   Diagnosis Date Noted  . Pre-diabetes 06/05/2015  . Primary localized osteoarthritis of right knee   . Anxiety   . Chest pain, midsternal 07/23/2013  . Osteopenia 03/23/2013  . Kidney stones, calcium oxalate 03/23/2013  . Hypertension 03/31/2011  . Hyperlipidemia 03/31/2011  . Vitamin D deficiency 03/31/2011  . Hallux rigidus 03/24/2011  . Metatarsalgia of left foot 03/24/2011  . Lateral epicondylitis of left elbow 02/24/2011  . Migraine 02/24/2011  . Depression 02/24/2011  . Arthritis 02/24/2011    Past Medical History:  Diagnosis Date  . Anxiety    takes Ativan nightly  . Depression    takes Zoloft daily  . History of colon polyps   . History of kidney stones   . History of staph infection 11/2013  . Hyperlipidemia    takes Crestor nightly   . Hypertension    takes Lisinopril and Coreg daily  . Joint pain   . Joint swelling   . Neuropathy    takes Elavil nightly  . Pneumonia   . Primary localized osteoarthritis of right knee     Past Surgical History:  Procedure Laterality Date  . ABDOMINAL HYSTERECTOMY  1989  . APPENDECTOMY    . CARDIAC CATHETERIZATION  2015  . CERVICAL SPINE SURGERY  2004   C 5-6  . COLONOSCOPY    . colonscopy    . DIAGNOSTIC LAPAROSCOPY  1976   fibroid removed  . ELBOW ARTHROSCOPY  2010, 2012  . EYE SURGERY Bilateral    cataract  . FOOT SURGERY Left 2013  . KNEE ARTHROSCOPY  Oct 2014  . KNEE ARTHROSCOPY W/ MENISCECTOMY Right 01/31/2013   [facility] [place]  . LATERAL EPICONDYLE RELEASE  03/01/2011   Procedure: TENNIS ELBOW RELEASE;  Surgeon: Lorn Junes, MD;  Location: [place];  Service: Orthopedics;  Laterality: Left;  TENOTOMY ELBOW LATERAL EPICONDYLITIS TENNIS ELBOW  . lateral release right elbow  2010  . left foot surgery   2013   mortons neuroma  . LEFT HEART CATHETERIZATION WITH CORONARY ANGIOGRAM N/A 07/23/2013   Procedure: LEFT HEART CATHETERIZATION WITH CORONARY ANGIOGRAM;  Surgeon: Sinclair Grooms, MD;  Location: [facility] CATH LAB;  Service: Cardiovascular;  Laterality: N/A;  . PAROTID GLAND TUMOR EXCISION  1976   benign  . PAROTID GLAND TUMOR EXCISION  1976   rt  . SHOULDER ARTHROSCOPY DISTAL CLAVICLE EXCISION AND OPEN ROTATOR CUFF REPAIR  2006  . SHOULDER ARTHROSCOPY W/ ROTATOR CUFF REPAIR  2006  . TOTAL KNEE ARTHROPLASTY Right 02/18/2014   Procedure: TOTAL KNEE ARTHROPLASTY;  Surgeon: Lorn Junes, MD;  Location: [place];  Service: Orthopedics;  Laterality: Right;    Social History   Tobacco Use  . Smoking status: Never Smoker  . Smokeless tobacco: Never Used  Substance Use Topics  . Alcohol use: Yes    Comment: 1-2 drinks weekly  . Drug use: No    Family History  Problem Relation Age of Onset  . Cancer Mother        lung  . Cancer Father        lung; + tobacco  . Heart disease Maternal Grandfather        heart attack  . Heart disease Paternal Grandfather        heart attack  . Diabetes Sister   . Hypertension Sister   . Hyperlipidemia Sister   . Breast cancer Neg Hx     Allergies  Allergen Reactions  . Penicillins Swelling and Rash    Medication list has been reviewed and updated.  Current Outpatient Medications on File Prior to Visit  Medication Sig Dispense Refill  . albuterol (PROVENTIL HFA;VENTOLIN HFA) 108 (90 Base) MCG/ACT inhaler Inhale 2 puffs every 6 (six) hours as needed into the lungs for wheezing or shortness of breath. 1 Inhaler 0  . amitriptyline (ELAVIL) 25 MG tablet TAKE ONE TABLET AT BEDTIME. 90 tablet 1  . carvedilol (COREG) 3.125 MG tablet TAKE 1 TABLET BY MOUTH TWICE DAILY WITH A MEAL. (Patient taking differently: at bedtime. ) 60 tablet 5  . lisinopril (PRINIVIL,ZESTRIL) 20 MG tablet Take 1 tablet (20 mg total) by mouth daily. 90 tablet 3  . ondansetron  (ZOFRAN ODT) 4 MG disintegrating tablet Take 1 tablet (4 mg total) by mouth every 8 (eight) hours as needed for nausea or vomiting. 20 tablet 0  . rosuvastatin (CRESTOR) 10 MG tablet Take 1 tablet (10 mg total) by mouth daily. 90 tablet 3  . sertraline (ZOLOFT) 100 MG tablet TAKE 2 & 1/2 TABLETS DAILY. 225 tablet 1  . traZODone (DESYREL) 50 MG tablet TAKE 1 OR 2 TABLETS AT  BEDTIME. 180 tablet 3   No current facility-administered medications on file prior to visit.     Review of Systems:  As per HPI- otherwise negative.   Physical Examination: There were no vitals filed for this visit. There were no vitals filed for this visit. There is no height or weight on file to calculate BMI. Ideal Body Weight:    Pt observed over video -she looks well.  No cough, wheezing, distress is noted.  Over video I am not able to appreciate any significant abnormality of her neck.  She is able to flex her chin to her chest  She is not checking vitals at home  Assessment and Plan: LAD (lymphadenopathy) of left cervical region - Plan: doxycycline (VIBRAMYCIN) 100 MG capsule  Virtual visit today for concern of likely lymphadenopathy She did not have any IV start for the procedure near her neck, so I think this is coincidental to her recent ER visit.  We will have her start on doxycycline 100 mg twice daily.  I explained to her that if this is not improving, we will need to see her back for recheck.  She will be sure to let me know if not getting better in the next 1 to 2 days, sooner if worse  Otherwise she will go ahead and call neurology and schedule appointment She wonders about a Tdap shot, as they are expecting a grandchild soon.  I advised her that typically we would definitely encourage this vaccine, but that she has a potentially unstable neurologic condition she should clear with neurology first  Signed Lamar Blinks, MD

## 2018-09-27 NOTE — Telephone Encounter (Signed)
Pt called with complaints of swelling from her left ear that goes down her neck; she says that it is swollen; she wants Dr Lorelei Pont to know, and wonders if it could be a result of the tests that she had on Monday; the pt also wants to alert Dr Lorelei Pont to review the pictures that she sent via my chart; the pt also states that this morning the pain woke her up, and she rates at 3-4 out of 10; it is painful to chew or move her neck; recommendations made per nurse triage protocol; the pt verbalized understanding;she sees Dr Janett Billow Cop[place], [place]; pt transferred to [facility] for scheduling.  Reason for Disposition . [1] Single large node AND [2] size > 1 inch (2.5 cm) AND [3] no fever  Answer Assessment - Initial Assessment Questions 1. ONSET: "When did the pain begin?"      09/27/2018 around 0100 2. LOCATION: "Where does it hurt?"      Behind left ear down neck; "feels firmer where jaw and neck meet" 3. PATTERN "Does the pain come and go, or has it been constant since it started?"      Intermittent with movement and chewing 4. SEVERITY: "How bad is the pain?"  (Scale 1-10; or mild, moderate, severe)   - MILD (1-3): doesn't interfere with normal activities    - MODERATE (4-7): interferes with normal activities or awakens from sleep    - SEVERE (8-10):  excruciating pain, unable to do any normal activities      3-4 out of 10 5. RADIATION: "Does the pain go anywhere else, shoot into your arms?"    no 6. CORD SYMPTOMS: "Any weakness or numbness of the arms or legs?"    no 7. CAUSE: "What do you think is causing the neck pain?"    Not sure 8. NECK OVERUSE: "Any recent activities that involved turning or twisting the neck?"    no 9. OTHER SYMPTOMS: "Do you have any other symptoms?" (e.g., headache, fever, chest pain, difficulty breathing, neck swelling)    swelling 10. PREGNANCY: "Is there any chance you are pregnant?" "When was your last menstrual period?"     no  Answer Assessment - Initial  Assessment Questions 1. LOCATION: "Where is the swollen node located?" "Is the matching node on the other side of the body also swollen?"      Left ear to neck 2. SIZE: "How big is the node?" (Inches or centimeters) (or compare to common objects such as pea, bean, marble, golf ball)     Walnut sized 3. ONSET: "When did the swelling start?"     09/27/2018 4. NECK NODES: "Is there a sore throat, runny nose or other symptoms of a cold?"      no 5. GROIN OR ARMPIT NODES: "Is there a sore, scratch, cut or painful red area on that arm or leg?"     mo 6. FEVER: "Do you have a fever?" If so, ask: "What is it, how was it measured, and when did it start?"     No temp 97.8 7. CAUSE: "What do you think is causing the swollen lymph nodes?"     Not sure 8. OTHER SYMPTOMS: "Do you have any other symptoms?"     swelling 9. PREGNANCY: "Is there any chance you are pregnant?" "When was your last menstrual period?"     no  Protocols used: LYMPH NODES SWOLLEN-A-AH, NECK PAIN OR STIFFNESS-A-AH

## 2018-09-27 NOTE — Telephone Encounter (Signed)
Virtual visit scheduled.  

## 2018-09-28 ENCOUNTER — Telehealth: Payer: Self-pay | Admitting: Neurology

## 2018-09-28 NOTE — Telephone Encounter (Signed)
°  Due to current COVID 19 pandemic, our office is severely reducing in office visits until further notice, in order to minimize the risk to our patients and healthcare providers.    Called patient and scheduled a virtual visit with Dr.Yan for 6/16. Patient verbalized understanding of the [email or web address] process and I have sent an e-mail to [email or web address] with link and instructions as well as my name and our office number. Patient understands that they will receive a call from RN to update chart.   Pt understands that although there may be some limitations with this type of visit, we will take all precautions to reduce any security or privacy concerns.  Pt understands that this will be treated like an in office visit and we will file with pt's insurance, and there may be a patient responsible charge related to this service.

## 2018-10-01 ENCOUNTER — Other Ambulatory Visit: Payer: Self-pay | Admitting: Family Medicine

## 2018-10-01 DIAGNOSIS — G4700 Insomnia, unspecified: Secondary | ICD-10-CM

## 2018-10-03 ENCOUNTER — Other Ambulatory Visit: Payer: Self-pay

## 2018-10-03 ENCOUNTER — Ambulatory Visit (INDEPENDENT_AMBULATORY_CARE_PROVIDER_SITE_OTHER): Payer: Medicare Other | Admitting: Neurology

## 2018-10-03 ENCOUNTER — Encounter: Payer: Self-pay | Admitting: Neurology

## 2018-10-03 DIAGNOSIS — R42 Dizziness and giddiness: Secondary | ICD-10-CM | POA: Insufficient documentation

## 2018-10-03 DIAGNOSIS — H538 Other visual disturbances: Secondary | ICD-10-CM

## 2018-10-03 DIAGNOSIS — H81399 Other peripheral vertigo, unspecified ear: Secondary | ICD-10-CM | POA: Diagnosis not present

## 2018-10-03 NOTE — Progress Notes (Signed)
PATIENT: Monica Kane DOB: [birth year]  Virtual Visit via video  I connected with Monica Kane on 10/03/18 at  by video and verified that I am speaking with the correct person using two identifiers.   I discussed the limitations, risks, security and privacy concerns of performing an evaluation and management service by video and the availability of in person appointments. I also discussed with the patient that there may be a patient responsible charge related to this service. The patient expressed understanding and agreed to proceed.  HISTORICAL  Monica Kane is a 73 years old female, seen in request by her primary care physician Dr. Darreld Mclean neurosurgeon Dr. Sherwood Gambler, [place], for evaluation of constellation of symptoms  I have reviewed and summarized the referring note from the referring physician.  She had a past medical history of hypertension, hyperlipidemia, cervical decompression surgery in the past,  She complains of to passing out episode, 4 days apart, the last one was on May 30, both episode she was walking with her great niece outside, she suddenly felt dizzy, lightheaded sensation, she fell to the ground with transient loss of consciousness, she could not remember the falling,   In addition, she complains of intermittent double vision, oftentimes vertigo, lasting for few days, also associated difficulty talking, lasting for few minutes, she also described one episode difficulty looking towards her left side lasting for few days, while he walked to the door, sometimes she hits right side of the body on the door frame,  She denied persistent visual loss, lateralized motor or sensory deficit,  She presented to emergency room on June 8, I personally reviewed CT head, MRI of the brain, mild atrophy, no significant abnormality  Laboratory evaluation showed normal CMP CBC, negative UDS   Observations/Objective: I have reviewed problem lists, medications, allergies.   She is awake, alert, oriented to history taking, and casual  conversation, moving 4 extremities without difficulties  Assessment and Plan: Constellation of neurological complaints,  Double vision, dizziness  MRA of brain and neck to rule out significant large vessel stenosis  Acetylcholine receptor antibody, TSH  Follow Up Instructions:  In 3 months    I discussed the assessment and treatment plan with the patient. The patient was provided an opportunity to ask questions and all were answered. The patient agreed with the plan and demonstrated an understanding of the instructions.   The patient was advised to call back or seek an in-person evaluation if the symptoms worsen or if the condition fails to improve as anticipated.  I provided 30 minutes of non-face-to-face time during this encounter.  REVIEW OF SYSTEMS: Full 14 system review of systems performed and notable only for as above All other review of systems were negative.  ALLERGIES: Allergies  Allergen Reactions  . Penicillins Swelling and Rash    HOME MEDICATIONS: Current Outpatient Medications  Medication Sig Dispense Refill  . albuterol (PROVENTIL HFA;VENTOLIN HFA) 108 (90 Base) MCG/ACT inhaler Inhale 2 puffs every 6 (six) hours as needed into the lungs for wheezing or shortness of breath. 1 Inhaler 0  . amitriptyline (ELAVIL) 25 MG tablet TAKE ONE TABLET AT BEDTIME. 90 tablet 1  . carvedilol (COREG) 3.125 MG tablet TAKE 1 TABLET BY MOUTH TWICE DAILY WITH A MEAL. (Patient taking differently: at bedtime. ) 60 tablet 5  . doxycycline (VIBRAMYCIN) 100 MG capsule Take 1 capsule (100 mg total) by mouth 2 (two) times daily. 20 capsule 0  . lisinopril (PRINIVIL,ZESTRIL) 20 MG tablet Take  1 tablet (20 mg total) by mouth daily. 90 tablet 3  . ondansetron (ZOFRAN ODT) 4 MG disintegrating tablet Take 1 tablet (4 mg total) by mouth every 8 (eight) hours as needed for nausea or vomiting. 20 tablet 0  . rosuvastatin (CRESTOR) 10 MG  tablet Take 1 tablet (10 mg total) by mouth daily. 90 tablet 3  . sertraline (ZOLOFT) 100 MG tablet TAKE 2 & 1/2 TABLETS DAILY. 225 tablet 1  . traZODone (DESYREL) 50 MG tablet TAKE 1 OR 2 TABLETS AT BEDTIME. 180 tablet 1   No current facility-administered medications for this visit.     PAST MEDICAL HISTORY: Past Medical History:  Diagnosis Date  . Anxiety    takes Ativan nightly  . Depression    takes Zoloft daily  . History of colon polyps   . History of kidney stones   . History of staph infection 11/2013  . Hyperlipidemia    takes Crestor nightly   . Hypertension    takes Lisinopril and Coreg daily  . Joint pain   . Joint swelling   . Neuropathy    takes Elavil nightly  . Pneumonia   . Primary localized osteoarthritis of right knee     PAST SURGICAL HISTORY: Past Surgical History:  Procedure Laterality Date  . ABDOMINAL HYSTERECTOMY  1989  . APPENDECTOMY    . CARDIAC CATHETERIZATION  2015  . CERVICAL SPINE SURGERY  2004   C 5-6  . COLONOSCOPY    . colonscopy    . DIAGNOSTIC LAPAROSCOPY  1976   fibroid removed  . ELBOW ARTHROSCOPY  2010, 2012  . EYE SURGERY Bilateral    cataract  . FOOT SURGERY Left 2013  . KNEE ARTHROSCOPY  Oct 2014  . KNEE ARTHROSCOPY W/ MENISCECTOMY Right 01/31/2013   Noemi Chapel  . LATERAL EPICONDYLE RELEASE  03/01/2011   Procedure: TENNIS ELBOW RELEASE;  Surgeon: Lorn Junes, MD;  Location: [place];  Service: Orthopedics;  Laterality: Left;  TENOTOMY ELBOW LATERAL EPICONDYLITIS TENNIS ELBOW  . lateral release right elbow  2010  . left foot surgery  2013   mortons neuroma  . LEFT HEART CATHETERIZATION WITH CORONARY ANGIOGRAM N/A 07/23/2013   Procedure: LEFT HEART CATHETERIZATION WITH CORONARY ANGIOGRAM;  Surgeon: Sinclair Grooms, MD;  Location[facility];  Service: Cardiovascular;  Laterality: N/A;  . PAROTID GLAND TUMOR EXCISION  1976   benign  . PAROTID GLAND TUMOR EXCISION  1976   rt  . SHOULDER ARTHROSCOPY  DISTAL CLAVICLE EXCISION AND OPEN ROTATOR CUFF REPAIR  2006  . SHOULDER ARTHROSCOPY W/ ROTATOR CUFF REPAIR  2006  . TOTAL KNEE ARTHROPLASTY Right 02/18/2014   Procedure: TOTAL KNEE ARTHROPLASTY;  Surgeon: Lorn Junes, MD;  Location: [place];  Service: Orthopedics;  Laterality: Right;    FAMILY HISTORY: Family History  Problem Relation Age of Onset  . Cancer Mother        lung  . Cancer Father        lung; + tobacco  . Heart disease Maternal Grandfather        heart attack  . Heart disease Paternal Grandfather        heart attack  . Diabetes Sister   . Hypertension Sister   . Hyperlipidemia Sister   . Breast cancer Neg Hx     SOCIAL HISTORY:   Social History   Socioeconomic History  . Marital status: Married    Spouse name: Marqueta Pulley  . Number of children:  Not on file  . Years of education: Not on file  . Highest education level: Not on file  Occupational History  . Not on file  Social Needs  . Financial resource strain: Not on file  . Food insecurity    Worry: Not on file    Inability: Not on file  . Transportation needs    Medical: Not on file    Non-medical: Not on file  Tobacco Use  . Smoking status: Never Smoker  . Smokeless tobacco: Never Used  Substance and Sexual Activity  . Alcohol use: Yes    Comment: 1-2 drinks weekly  . Drug use: No  . Sexual activity: Yes    Birth control/protection: Surgical  Lifestyle  . Physical activity    Days per week: Not on file    Minutes per session: Not on file  . Stress: Not on file  Relationships  . Social Herbalist on phone: Not on file    Gets together: Not on file    Attends religious service: Not on file    Active member of club or organization: Not on file    Attends meetings of clubs or organizations: Not on file    Relationship status: Not on file  . Intimate partner violence    Fear of current or ex partner: Not on file    Emotionally abused: Not on file    Physically abused: Not on  file    Forced sexual activity: Not on file  Other Topics Concern  . Not on file  Social History Narrative   Lives with her husband.  He has 2 adult children. Patient has a Financial risk analyst, and she does exercise.    Marcial Pacas, M.D. Ph.D.  [facility] [address], [place], [place] [address] Ph: [phone] Fax: [phone]  CC:  Cop[place], Gay Filler, MD Jovita Gamma, MD

## 2018-10-04 ENCOUNTER — Telehealth: Payer: Self-pay | Admitting: Neurology

## 2018-10-04 NOTE — Telephone Encounter (Signed)
Medicare/BCBS supp order sent to [facility]. No auth they will reach out to the patient to schedule.  

## 2018-10-17 DIAGNOSIS — Z961 Presence of intraocular lens: Secondary | ICD-10-CM | POA: Diagnosis not present

## 2018-10-19 ENCOUNTER — Ambulatory Visit: Payer: Medicare Other | Admitting: Neurology

## 2018-10-25 ENCOUNTER — Other Ambulatory Visit: Payer: Self-pay

## 2018-10-25 ENCOUNTER — Other Ambulatory Visit (INDEPENDENT_AMBULATORY_CARE_PROVIDER_SITE_OTHER): Payer: Self-pay

## 2018-10-25 DIAGNOSIS — H81399 Other peripheral vertigo, unspecified ear: Secondary | ICD-10-CM | POA: Diagnosis not present

## 2018-10-25 DIAGNOSIS — D519 Vitamin B12 deficiency anemia, unspecified: Secondary | ICD-10-CM | POA: Diagnosis not present

## 2018-10-25 DIAGNOSIS — R42 Dizziness and giddiness: Secondary | ICD-10-CM | POA: Diagnosis not present

## 2018-10-25 DIAGNOSIS — Z0289 Encounter for other administrative examinations: Secondary | ICD-10-CM

## 2018-10-25 DIAGNOSIS — H538 Other visual disturbances: Secondary | ICD-10-CM | POA: Diagnosis not present

## 2018-10-25 DIAGNOSIS — R5383 Other fatigue: Secondary | ICD-10-CM | POA: Diagnosis not present

## 2018-10-26 ENCOUNTER — Ambulatory Visit
Admission: RE | Admit: 2018-10-26 | Discharge: 2018-10-26 | Disposition: A | Discharge status: Home / Self Care | Payer: Medicare Other | Source: Ambulatory Visit | Attending: Neurology | Admitting: Neurology

## 2018-10-26 ENCOUNTER — Ambulatory Visit: Payer: Medicare Other | Admitting: Neurology

## 2018-10-26 ENCOUNTER — Other Ambulatory Visit: Payer: Self-pay

## 2018-10-26 DIAGNOSIS — H81399 Other peripheral vertigo, unspecified ear: Secondary | ICD-10-CM

## 2018-10-26 LAB — RPR: RPR Ser Ql: NONREACTIVE

## 2018-10-26 LAB — ACETYLCHOLINE RECEPTOR, BINDING: AChR Binding Ab, Serum: <0.03 nmol/L (ref 0.00–0.24)

## 2018-10-26 LAB — TSH: TSH: 1.69 u[IU]/mL (ref 0.450–4.500)

## 2018-10-26 LAB — VITAMIN B12: Vitamin B-12: 372 pg/mL (ref 232–1245)

## 2018-10-26 MED ORDER — GADOBENATE DIMEGLUMINE 529 MG/ML IV SOLN
15.0000 mL | Freq: Once | INTRAVENOUS | Status: AC | PRN
  Administered 2018-10-26: 15 mL via INTRAVENOUS

## 2018-10-30 ENCOUNTER — Telehealth: Payer: Self-pay | Admitting: *Deleted

## 2018-10-30 NOTE — Telephone Encounter (Signed)
I spoke to the patient and she verbalized understanding of the results below. °

## 2018-10-30 NOTE — Telephone Encounter (Signed)
-----   Message from Marcial Pacas, MD sent at 10/30/2018 12:26 PM EDT ----- Please call pt for no significant abnormality on MRA of the neck.

## 2018-11-07 ENCOUNTER — Other Ambulatory Visit: Payer: Self-pay | Admitting: Family Medicine

## 2018-11-07 DIAGNOSIS — E785 Hyperlipidemia, unspecified: Secondary | ICD-10-CM

## 2019-02-08 ENCOUNTER — Other Ambulatory Visit: Payer: Self-pay | Admitting: Family Medicine

## 2019-02-08 DIAGNOSIS — I10 Essential (primary) hypertension: Secondary | ICD-10-CM

## 2019-02-15 ENCOUNTER — Ambulatory Visit: Payer: Medicare Other

## 2019-02-28 ENCOUNTER — Ambulatory Visit (INDEPENDENT_AMBULATORY_CARE_PROVIDER_SITE_OTHER): Payer: Medicare Other

## 2019-02-28 ENCOUNTER — Other Ambulatory Visit: Payer: Self-pay

## 2019-02-28 DIAGNOSIS — Z23 Encounter for immunization: Secondary | ICD-10-CM | POA: Diagnosis not present

## 2019-03-01 ENCOUNTER — Other Ambulatory Visit: Payer: Self-pay | Admitting: Family Medicine

## 2019-03-01 DIAGNOSIS — Z1231 Encounter for screening mammogram for malignant neoplasm of breast: Secondary | ICD-10-CM

## 2019-03-10 ENCOUNTER — Encounter: Payer: Self-pay | Admitting: Family Medicine

## 2019-03-10 DIAGNOSIS — Z20828 Contact with and (suspected) exposure to other viral communicable diseases: Secondary | ICD-10-CM

## 2019-03-10 DIAGNOSIS — Z20822 Contact with and (suspected) exposure to covid-19: Secondary | ICD-10-CM

## 2019-03-10 DIAGNOSIS — Z01812 Encounter for preprocedural laboratory examination: Secondary | ICD-10-CM

## 2019-03-12 DIAGNOSIS — Z20828 Contact with and (suspected) exposure to other viral communicable diseases: Secondary | ICD-10-CM | POA: Diagnosis not present

## 2019-04-06 ENCOUNTER — Other Ambulatory Visit: Payer: Self-pay | Admitting: Family Medicine

## 2019-04-06 DIAGNOSIS — G609 Hereditary and idiopathic neuropathy, unspecified: Secondary | ICD-10-CM

## 2019-04-23 ENCOUNTER — Ambulatory Visit: Payer: Medicare Other

## 2019-04-24 ENCOUNTER — Other Ambulatory Visit: Payer: Self-pay | Admitting: Family Medicine

## 2019-04-24 DIAGNOSIS — G4700 Insomnia, unspecified: Secondary | ICD-10-CM

## 2019-05-10 ENCOUNTER — Other Ambulatory Visit: Payer: Self-pay | Admitting: Family Medicine

## 2019-05-10 DIAGNOSIS — F321 Major depressive disorder, single episode, moderate: Secondary | ICD-10-CM

## 2019-05-17 ENCOUNTER — Ambulatory Visit: Payer: Medicare Other

## 2019-05-22 ENCOUNTER — Other Ambulatory Visit: Payer: Self-pay | Admitting: Family Medicine

## 2019-05-22 DIAGNOSIS — E785 Hyperlipidemia, unspecified: Secondary | ICD-10-CM

## 2019-05-25 ENCOUNTER — Ambulatory Visit: Payer: Medicare Other | Attending: Internal Medicine

## 2019-05-25 DIAGNOSIS — Z23 Encounter for immunization: Secondary | ICD-10-CM

## 2019-05-25 NOTE — Progress Notes (Signed)
   Covid-19 Vaccination Clinic  Name:  Monica Kane    MRN: [id number] DOB: [birth year]  05/25/2019  [name] was observed post Covid-19 immunization for 15 minutes without incidence. She was provided with Vaccine Information Sheet and instruction to access the V-Safe system.   [name] was instructed to call 911 with any severe reactions post vaccine: Marland Kitchen Difficulty breathing  . Swelling of your face and throat  . A fast heartbeat  . A bad rash all over your body  . Dizziness and weakness    Immunizations Administered    Name Date Dose VIS Date Route   Pfizer COVID-19 Vaccine 05/25/2019  3:05 PM 0.3 mL 03/30/2019 Intramuscular   Manufacturer: [place]   Lot: [id number]   [place]: [id number]

## 2019-06-07 ENCOUNTER — Ambulatory Visit: Payer: Medicare Other

## 2019-06-19 ENCOUNTER — Ambulatory Visit: Payer: Medicare Other | Attending: Internal Medicine

## 2019-06-19 DIAGNOSIS — Z23 Encounter for immunization: Secondary | ICD-10-CM | POA: Insufficient documentation

## 2019-06-19 NOTE — Progress Notes (Signed)
   Covid-19 Vaccination Clinic  Name:  Monica Kane    MRN: [id number] DOB: 03-23-1946  06/19/2019  [name] was observed post Covid-19 immunization for 15 minutes without incident. She was provided with Vaccine Information Sheet and instruction to access the V-Safe system.   [name] was instructed to call 911 with any severe reactions post vaccine: Marland Kitchen Difficulty breathing  . Swelling of face and throat  . A fast heartbeat  . A bad rash all over body  . Dizziness and weakness   Immunizations Administered    Name Date Dose VIS Date Route   Pfizer COVID-19 Vaccine 06/19/2019  2:27 PM 0.3 mL 03/30/2019 Intramuscular   Manufacturer: [place]   Lot: [id number]   [place]: [id number]

## 2019-06-21 ENCOUNTER — Ambulatory Visit
Admission: RE | Admit: 2019-06-21 | Discharge: 2019-06-21 | Disposition: A | Discharge status: Home / Self Care | Payer: Medicare Other | Source: Ambulatory Visit | Attending: Family Medicine | Admitting: Family Medicine

## 2019-06-21 ENCOUNTER — Other Ambulatory Visit: Payer: Self-pay

## 2019-06-21 DIAGNOSIS — Z1231 Encounter for screening mammogram for malignant neoplasm of breast: Secondary | ICD-10-CM

## 2019-07-25 ENCOUNTER — Other Ambulatory Visit: Payer: Self-pay

## 2019-07-25 NOTE — Patient Instructions (Addendum)
It was good to see you today, I will be in touch with your labs as soon as possible We will look for any cause of your fatigue I will also set you up for a bone density- if you like, stop by imaging on your way out, they may be able to do this today

## 2019-07-25 NOTE — Progress Notes (Addendum)
[facility] [address] [place], [place] [address] [phone]  Date:  07/26/2019   Name:  Monica Kane   DOB:  February 20, 1946   MRN:  [id number]  PCP:  Darreld Mclean, MD    Chief Complaint: Fatigue (exhuastion) and Cough (dry cough, several months, diarrhea, fatigue, chills)   History of Present Illness:  Monica Kane is a 74 y.o. very pleasant female patient who presents with the following:  Patient with history of hypertension, prediabetes hyperlipidemia here today for follow-up visit She is concerned about the possibility of diabetes-would like an A1c today.  Her main concern is that she has felt fatigued  She does have OSA- uses an oral mouth piece for treatment which seems to work well Her husband notes that she sleeps better with much less snoring and no apparent apnea with her appliance   She has felt fatigued/sleepy for the last 2-3 months.  Seemed to get worse after she had her covid vaccines She is sleeping 9- 10 hours at night; this is typical for her.  However she continues to feel sleepy and is taking naps, she may go to bed early No fever noted She feels ok for a while when she first wakes up and becomes more tired during the day She may cough at night- it can wake her up- and also during the day.  Thinks this is allergies She gained a few lbs during the pandemic   Wt Readings from Last 3 Encounters:  07/26/19 160 lb (72.6 kg)  09/25/18 164 lb (74.4 kg)  01/04/18 159 lb (72.1 kg)   No unusual thirst or urinary frequency   Lab Results  Component Value Date   HGBA1C 6.3 10/31/2017   Our last visit was a virtual office visit in June  Colon cancer screening- she is due, will see her doc at [facility] Routine labs today- she is fasting today   Amitriptyline-takes for neuropathy in her feet Carvedilol Lisinopril Crestor Zoloft Trazodone  Patient Active Problem List   Diagnosis Date Noted  .  OSA (obstructive sleep apnea) 07/26/2019  . Vertigo 10/03/2018  . Visual blurriness 10/03/2018  . Pre-diabetes 06/05/2015  . Primary localized osteoarthritis of right knee   . Anxiety   . Chest pain, midsternal 07/23/2013  . Osteopenia 03/23/2013  . Kidney stones, calcium oxalate 03/23/2013  . Hypertension 03/31/2011  . Hyperlipidemia 03/31/2011  . Vitamin D deficiency 03/31/2011  . Hallux rigidus 03/24/2011  . Metatarsalgia of left foot 03/24/2011  . Lateral epicondylitis of left elbow 02/24/2011  . Migraine 02/24/2011  . Depression 02/24/2011  . Arthritis 02/24/2011    Past Medical History:  Diagnosis Date  . Anxiety    takes Ativan nightly  . Depression    takes Zoloft daily  . History of colon polyps   . History of kidney stones   . History of staph infection 11/2013  . Hyperlipidemia    takes Crestor nightly   . Hypertension    takes Lisinopril and Coreg daily  . Joint pain   . Joint swelling   . Neuropathy    takes Elavil nightly  . Pneumonia   . Primary localized osteoarthritis of right knee     Past Surgical History:  Procedure Laterality Date  . ABDOMINAL HYSTERECTOMY  1989  . APPENDECTOMY    . CARDIAC CATHETERIZATION  2015  . CERVICAL SPINE SURGERY  2004   C 5-6  .  COLONOSCOPY    . colonscopy    . DIAGNOSTIC LAPAROSCOPY  1976   fibroid removed  . ELBOW ARTHROSCOPY  2010, 2012  . EYE SURGERY Bilateral    cataract  . FOOT SURGERY Left 2013  . KNEE ARTHROSCOPY  Oct 2014  . KNEE ARTHROSCOPY W/ MENISCECTOMY Right 01/31/2013   Noemi Chapel  . LATERAL EPICONDYLE RELEASE  03/01/2011   Procedure: TENNIS ELBOW RELEASE;  Surgeon: Lorn Junes, MD;  Location: [place];  Service: Orthopedics;  Laterality: Left;  TENOTOMY ELBOW LATERAL EPICONDYLITIS TENNIS ELBOW  . lateral release right elbow  2010  . left foot surgery  2013   mortons neuroma  . LEFT HEART CATHETERIZATION WITH CORONARY ANGIOGRAM N/A 07/23/2013   Procedure: LEFT HEART  CATHETERIZATION WITH CORONARY ANGIOGRAM;  Surgeon: Sinclair Grooms, MD;  Location: [facility] C[facility] LAB;  Service: Cardiovascular;  Laterality: N/A;  . PAROTID GLAND TUMOR EXCISION  1976   benign  . PAROTID GLAND TUMOR EXCISION  1976   rt  . SHOULDER ARTHROSCOPY DISTAL CLAVICLE EXCISION AND OPEN ROTATOR CUFF REPAIR  2006  . SHOULDER ARTHROSCOPY W/ ROTATOR CUFF REPAIR  2006  . TOTAL KNEE ARTHROPLASTY Right 02/18/2014   Procedure: TOTAL KNEE ARTHROPLASTY;  Surgeon: Lorn Junes, MD;  Location: [place];  Service: Orthopedics;  Laterality: Right;    Social History   Tobacco Use  . Smoking status: Never Smoker  . Smokeless tobacco: Never Used  Substance Use Topics  . Alcohol use: Yes    Comment: 1-2 drinks weekly  . Drug use: No    Family History  Problem Relation Age of Onset  . Cancer Mother        lung  . Cancer Father        lung; + tobacco  . Heart disease Maternal Grandfather        heart attack  . Heart disease Paternal Grandfather        heart attack  . Diabetes Sister   . Hypertension Sister   . Hyperlipidemia Sister   . Breast cancer Neg Hx     Allergies  Allergen Reactions  . Penicillins Swelling and Rash    Medication list has been reviewed and updated.  Current Outpatient Medications on File Prior to Visit  Medication Sig Dispense Refill  . amitriptyline (ELAVIL) 25 MG tablet TAKE ONE TABLET AT BEDTIME. 90 tablet 1  . carvedilol (COREG) 3.125 MG tablet TAKE 1 TABLET BY MOUTH TWICE DAILY WITH A MEAL. (Patient taking differently: at bedtime. ) 60 tablet 5  . lisinopril (ZESTRIL) 20 MG tablet TAKE 1 TABLET BY MOUTH DAILY. 90 tablet 1  . rosuvastatin (CRESTOR) 10 MG tablet TAKE 1 TABLET ONCE DAILY. 90 tablet 1  . sertraline (ZOLOFT) 100 MG tablet TAKE 2 & 1/2 TABLETS DAILY. 225 tablet 1  . traZODone (DESYREL) 50 MG tablet TAKE 1 OR 2 TABLETS AT BEDTIME. 180 tablet 1   No current facility-administered medications on file prior to visit.    Review of Systems:  As  per HPI- otherwise negative.   Physical Examination: Vitals:   07/26/19 0912  BP: (!) 165/82  Pulse: 64  Resp: 16  Temp: (!) 97.5 F (36.4 C)  SpO2: 99%   Vitals:   07/26/19 0912  Weight: 160 lb (72.6 kg)  Height: 5\' 4"  (1.626 m)   Body mass index is 27.46 kg/m. Ideal Body Weight: Weight in (lb) to have BMI = 25: 145.3  GEN: no acute distress.  Looks  well and younger than age, minimal overweight HEENT: Atraumatic, Normocephalic.  Ears and Nose: No external deformity. CV: RRR, No M/G/R. No JVD. No thrill. No extra heart sounds. PULM: CTA B, no wheezes, crackles, rhonchi. No retractions. No resp. distress. No accessory muscle use. ABD: S, NT, ND, +BS. No rebound. No HSM. EXTR: No c/c/e PSYCH: Normally interactive. Conversant.    Assessment and Plan: Essential hypertension - Plan: CBC, Comprehensive metabolic panel  Mixed hyperlipidemia - Plan: Lipid panel  Hyperglycemia - Plan: Hemoglobin A1c  Pre-diabetes - Plan: Hemoglobin A1c  Screening for colon cancer  OSA (obstructive sleep apnea)  Osteopenia, unspecified location - Plan: Vitamin D (25 hydroxy), DG Bone Density  Fatigue, unspecified type - Plan: TSH, Vitamin D (25 hydroxy)  Estrogen deficiency - Plan: DG Bone Density  Vitamin D deficiency - Plan: Vitamin D (25 hydroxy)  Here today for a follow-up visit and with concern of fatigue.  Routine labs and evaluation for fatigue pending as above Ordered bone density which is due She is established with GI, will see her doc soon for screening colonoscopy Will plan further follow- up pending labs. If labs are normal, will suggest follow-up with her neurologist regarding sleep apnea This visit occurred during the SARS-CoV-2 public health emergency.  Safety protocols were in place, including screening questions prior to the visit, additional usage of staff PPE, and extensive cleaning of exam room while observing appropriate contact time as indicated for disinfecting  solutions.   Moderate medical decision making today Signed Lamar Blinks, MD  Addendum 4/8 Received her labs as below, message to patient  Results for orders placed or performed in visit on 07/26/19  CBC  Result Value Ref Range   WBC 5.3 4.0 - 10.5 K/uL   RBC 5.23 (H) 3.87 - 5.11 Mil/uL   Platelets 219.0 150.0 - 400.0 K/uL   Hemoglobin 13.8 12.0 - 15.0 g/dL   HCT 42.7 36.0 - 46.0 %   MCV 81.8 78.0 - 100.0 fl   MCHC 32.4 30.0 - 36.0 g/dL   RDW 13.8 11.5 - 15.5 %  Comprehensive metabolic panel  Result Value Ref Range   Sodium 140 135 - 145 mEq/L   Potassium 4.7 hemolyzed sample 3.5 - 5.1 mEq/L   Chloride 106 96 - 112 mEq/L   CO2 26 19 - 32 mEq/L   Glucose, Bld 128 (H) 70 - 99 mg/dL   BUN 19 6 - 23 mg/dL   Creatinine, Ser 0.96 0.40 - 1.20 mg/dL   Total Bilirubin 0.7 0.2 - 1.2 mg/dL   Alkaline Phosphatase 88 39 - 117 U/L   AST 25 0 - 37 U/L   ALT 14 0 - 35 U/L   Total Protein 6.5 6.0 - 8.3 g/dL   Albumin 4.4 3.5 - 5.2 g/dL   GFR 57.18 (L) >60.00 mL/min   Calcium 9.7 8.4 - 10.5 mg/dL  Hemoglobin A1c  Result Value Ref Range   Hgb A1c MFr Bld 6.3 4.6 - 6.5 %  Lipid panel  Result Value Ref Range   Cholesterol 162 0 - 200 mg/dL   Triglycerides 118.0 0.0 - 149.0 mg/dL   HDL 59.50 >39.00 mg/dL   VLDL 23.6 0.0 - 40.0 mg/dL   LDL Cholesterol 79 0 - 99 mg/dL   Total CHOL/HDL Ratio 3    NonHDL 102.13   TSH  Result Value Ref Range   TSH 3.29 0.35 - 4.50 uIU/mL  Vitamin D (25 hydroxy)  Result Value Ref Range   VITD 15.42 (L)  30.00 - 100.00 ng/mL

## 2019-07-26 ENCOUNTER — Encounter: Payer: Self-pay | Admitting: Family Medicine

## 2019-07-26 ENCOUNTER — Ambulatory Visit (HOSPITAL_BASED_OUTPATIENT_CLINIC_OR_DEPARTMENT_OTHER)
Admission: RE | Admit: 2019-07-26 | Discharge: 2019-07-26 | Disposition: A | Discharge status: Home / Self Care | Payer: Medicare Other | Source: Ambulatory Visit | Attending: Family Medicine | Admitting: Family Medicine

## 2019-07-26 ENCOUNTER — Other Ambulatory Visit: Payer: Self-pay

## 2019-07-26 ENCOUNTER — Ambulatory Visit (INDEPENDENT_AMBULATORY_CARE_PROVIDER_SITE_OTHER): Payer: Medicare Other | Admitting: Family Medicine

## 2019-07-26 VITALS — BP 142/75 | HR 64 | Temp 97.5°F | Resp 16 | Ht 64.0 in | Wt 160.0 lb

## 2019-07-26 DIAGNOSIS — M8580 Other specified disorders of bone density and structure, unspecified site: Secondary | ICD-10-CM | POA: Insufficient documentation

## 2019-07-26 DIAGNOSIS — E559 Vitamin D deficiency, unspecified: Secondary | ICD-10-CM

## 2019-07-26 DIAGNOSIS — R5383 Other fatigue: Secondary | ICD-10-CM | POA: Diagnosis not present

## 2019-07-26 DIAGNOSIS — Z1211 Encounter for screening for malignant neoplasm of colon: Secondary | ICD-10-CM | POA: Diagnosis not present

## 2019-07-26 DIAGNOSIS — E782 Mixed hyperlipidemia: Secondary | ICD-10-CM | POA: Diagnosis not present

## 2019-07-26 DIAGNOSIS — E2839 Other primary ovarian failure: Secondary | ICD-10-CM | POA: Insufficient documentation

## 2019-07-26 DIAGNOSIS — R7303 Prediabetes: Secondary | ICD-10-CM

## 2019-07-26 DIAGNOSIS — R739 Hyperglycemia, unspecified: Secondary | ICD-10-CM | POA: Diagnosis not present

## 2019-07-26 DIAGNOSIS — G4733 Obstructive sleep apnea (adult) (pediatric): Secondary | ICD-10-CM | POA: Insufficient documentation

## 2019-07-26 DIAGNOSIS — I10 Essential (primary) hypertension: Secondary | ICD-10-CM | POA: Diagnosis not present

## 2019-07-26 DIAGNOSIS — M85852 Other specified disorders of bone density and structure, left thigh: Secondary | ICD-10-CM | POA: Diagnosis not present

## 2019-07-26 LAB — CBC
HCT: 42.7 % (ref 36.0–46.0)
Hemoglobin: 13.8 g/dL (ref 12.0–15.0)
MCHC: 32.4 g/dL (ref 30.0–36.0)
MCV: 81.8 fl (ref 78.0–100.0)
Platelets: 219 10*3/uL (ref 150.0–400.0)
RBC: 5.23 Mil/uL — ABNORMAL HIGH (ref 3.87–5.11)
RDW: 13.8 % (ref 11.5–15.5)
WBC: 5.3 10*3/uL (ref 4.0–10.5)

## 2019-07-26 LAB — COMPREHENSIVE METABOLIC PANEL
ALT: 14 U/L (ref 0–35)
AST: 25 U/L (ref 0–37)
Albumin: 4.4 g/dL (ref 3.5–5.2)
Alkaline Phosphatase: 88 U/L (ref 39–117)
BUN: 19 mg/dL (ref 6–23)
CO2: 26 mEq/L (ref 19–32)
Calcium: 9.7 mg/dL (ref 8.4–10.5)
Chloride: 106 mEq/L (ref 96–112)
Creatinine, Ser: 0.96 mg/dL (ref 0.40–1.20)
GFR: 57.18 mL/min — ABNORMAL LOW (ref >60.00)
Glucose, Bld: 128 mg/dL — ABNORMAL HIGH (ref 70–99)
Potassium: 4.7 mEq/L (ref 3.5–5.1)
Sodium: 140 mEq/L (ref 135–145)
Total Bilirubin: 0.7 mg/dL (ref 0.2–1.2)
Total Protein: 6.5 g/dL (ref 6.0–8.3)

## 2019-07-26 LAB — LIPID PANEL
Cholesterol: 162 mg/dL (ref 0–200)
HDL: 59.5 mg/dL (ref >39.00)
LDL Cholesterol: 79 mg/dL (ref 0–99)
NonHDL: 102.13
Total CHOL/HDL Ratio: 3
Triglycerides: 118 mg/dL (ref 0.0–149.0)
VLDL: 23.6 mg/dL (ref 0.0–40.0)

## 2019-07-26 LAB — HEMOGLOBIN A1C: Hgb A1c MFr Bld: 6.3 % (ref 4.6–6.5)

## 2019-07-26 LAB — TSH: TSH: 3.29 u[IU]/mL (ref 0.35–4.50)

## 2019-07-26 LAB — VITAMIN D 25 HYDROXY (VIT D DEFICIENCY, FRACTURES): VITD: 15.42 ng/mL — ABNORMAL LOW (ref 30.00–100.00)

## 2019-07-26 MED ORDER — VITAMIN D3 1.25 MG (50000 UT) PO CAPS: ORAL_CAPSULE | ORAL | 0 refills | Status: DC

## 2019-07-26 NOTE — Addendum Note (Signed)
Addended by: Lamar Blinks C on: 07/26/2019 07:20 PM   Modules accepted: Orders

## 2019-08-20 ENCOUNTER — Other Ambulatory Visit: Payer: Self-pay | Admitting: Family Medicine

## 2019-09-11 ENCOUNTER — Other Ambulatory Visit: Payer: Self-pay

## 2019-09-11 ENCOUNTER — Ambulatory Visit (INDEPENDENT_AMBULATORY_CARE_PROVIDER_SITE_OTHER): Payer: Medicare Other | Admitting: Orthopedic Surgery

## 2019-09-11 ENCOUNTER — Encounter: Payer: Self-pay | Admitting: Orthopedic Surgery

## 2019-09-11 ENCOUNTER — Ambulatory Visit (INDEPENDENT_AMBULATORY_CARE_PROVIDER_SITE_OTHER): Payer: Medicare Other

## 2019-09-11 VITALS — Ht 64.0 in | Wt 160.0 lb

## 2019-09-11 DIAGNOSIS — M79671 Pain in right foot: Secondary | ICD-10-CM

## 2019-09-11 DIAGNOSIS — M6701 Short Achilles tendon (acquired), right ankle: Secondary | ICD-10-CM

## 2019-09-12 ENCOUNTER — Encounter: Payer: Self-pay | Admitting: Orthopedic Surgery

## 2019-09-12 NOTE — Progress Notes (Signed)
Office Visit Note   Patient: Monica Kane           Date of Birth: February 17, 1946           MRN: [id number] Visit Date: 09/11/2019              Requested by: Darreld Mclean, MD [address] [place],  [place] [address] PCP: Darreld Mclean, MD  Chief Complaint  Patient presents with  . Right Foot - Pain      HPI: Patient is a 74 year old woman who presents with 1 year history of right forefoot pain that she states is getting worse.  She states that the pain starts in the second toe and radiates up her foot she has tried Stretching orthotics physical therapy.  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  Patient states that she has decreased sleeping due to the pain she states it gets better with rest.  Assessment & Plan: Visit Diagnoses:  1. Pain in right foot   2. Achilles tendon contracture, right     Plan: Patient will continue with her conservative options with the rocker-bottom shoes and Achilles stretching.  Discussed that if she wanted to consider surgical intervention we could plan on a gastrocnemius recession Weil osteotomy of the second metatarsal and possible plantar plate repair at the MTP joint.  With the pain and swelling patient most likely has a plantar plate injury.   Follow-Up Instructions: Return if symptoms worsen or fail to improve.   Ortho Exam  Patient is alert, oriented, no adenopathy, well-dressed, normal affect, normal respiratory effort. Examination patient has good pulses she has dorsiflexion to neutral with her knee extended.  Patient does have swelling around the MTP joint and forced dorsiflexion reproduces pain on the plantar aspect of the MTP joint the plantar aspect of the MTP joint is tender to palpation.  Imaging: No results found. No images are attached to the encounter.  Labs: Lab Results  Component Value Date   HGBA1C 6.3 07/26/2019   HGBA1C 6.3 10/31/2017   HGBA1C 5.7 (H) 08/12/2016   REPTSTATUS 01/18/2014 FINAL 01/17/2014   GRAMSTAIN No WBC Seen 01/02/2014   GRAMSTAIN Rare Squamous Epithelial Cells Present 01/02/2014   GRAMSTAIN No Organisms Seen 01/02/2014   CULT  01/17/2014    INSIGNIFICANT GROWTH Performed at [facility] 09/10/2015     Lab Results  Component Value Date   ALBUMIN 4.4 07/26/2019   ALBUMIN 4.0 09/25/2018   ALBUMIN 4.1 10/31/2017    No results found for: MG Lab Results  Component Value Date   VD25OH 15.42 (L) 07/26/2019   VD25OH 38.29 09/10/2015   VD25OH 16 (L) 06/03/2015    No results found for: PREALBUMIN CBC EXTENDED Latest Ref Rng & Units 07/26/2019 09/25/2018 09/25/2018  WBC 4.0 - 10.5 K/uL 5.3 - 5.3  RBC 3.87 - 5.11 Mil/uL 5.23(H) - 4.90  HGB 12.0 - 15.0 g/dL 13.8 13.6 12.9  HCT 36.0 - 46.0 % 42.7 40.0 40.6  PLT 150.0 - 400.0 K/uL 219.0 - 178  NEUTROABS 1.7 - 7.7 K/uL - - 3.3  LYMPHSABS 0.7 - 4.0 K/uL - - 1.5     Body mass index is 27.46 kg/m.  Orders:  Orders Placed This Encounter  Procedures  . XR Foot 2 Views Right   No orders of the defined types were placed in this encounter.    Procedures: No  procedures performed  Clinical Data: No additional findings.  ROS:  All other systems negative, except as noted in the HPI. Review of Systems  Objective: Vital Signs: Ht 5\' 4"  (1.626 m)   Wt 160 lb (72.6 kg)   BMI 27.46 kg/m   Specialty Comments:  No specialty comments available.  PMFS History: Patient Active Problem List   Diagnosis Date Noted  . OSA (obstructive sleep apnea) 07/26/2019  . Vertigo 10/03/2018  . Visual blurriness 10/03/2018  . Pre-diabetes 06/05/2015  . Primary localized osteoarthritis of right knee   . Anxiety   . Chest pain, midsternal 07/23/2013  . Osteopenia 03/23/2013  . Kidney stones, calcium oxalate 03/23/2013  . Hypertension 03/31/2011  . Hyperlipidemia 03/31/2011  . Vitamin D deficiency 03/31/2011  . Hallux  rigidus 03/24/2011  . Metatarsalgia of left foot 03/24/2011  . Lateral epicondylitis of left elbow 02/24/2011  . Migraine 02/24/2011  . Depression 02/24/2011  . Arthritis 02/24/2011   Past Medical History:  Diagnosis Date  . Anxiety    takes Ativan nightly  . Depression    takes Zoloft daily  . History of colon polyps   . History of kidney stones   . History of staph infection 11/2013  . Hyperlipidemia    takes Crestor nightly   . Hypertension    takes Lisinopril and Coreg daily  . Joint pain   . Joint swelling   . Neuropathy    takes Elavil nightly  . Pneumonia   . Primary localized osteoarthritis of right knee     Family History  Problem Relation Age of Onset  . Cancer Mother        lung  . Cancer Father        lung; + tobacco  . Heart disease Maternal Grandfather        heart attack  . Heart disease Paternal Grandfather        heart attack  . Diabetes Sister   . Hypertension Sister   . Hyperlipidemia Sister   . Breast cancer Neg Hx     Past Surgical History:  Procedure Laterality Date  . ABDOMINAL HYSTERECTOMY  1989  . APPENDECTOMY    . CARDIAC CATHETERIZATION  2015  . CERVICAL SPINE SURGERY  2004   C 5-6  . COLONOSCOPY    . colonscopy    . DIAGNOSTIC LAPAROSCOPY  1976   fibroid removed  . ELBOW ARTHROSCOPY  2010, 2012  . EYE SURGERY Bilateral    cataract  . FOOT SURGERY Left 2013  . KNEE ARTHROSCOPY  Oct 2014  . KNEE ARTHROSCOPY W/ MENISCECTOMY Right 01/31/2013   [facility]  . LATERAL EPICONDYLE RELEASE  03/01/2011   Procedure: TENNIS ELBOW RELEASE;  Surgeon: Lorn Junes, MD;  Location: [facility];  Service: Orthopedics;  Laterality: Left;  TENOTOMY ELBOW LATERAL EPICONDYLITIS TENNIS ELBOW  . lateral release right elbow  2010  . left foot surgery  2013   mortons neuroma  . LEFT HEART CATHETERIZATION WITH CORONARY ANGIOGRAM N/A 07/23/2013   Procedure: LEFT HEART CATHETERIZATION WITH CORONARY ANGIOGRAM;  Surgeon: Sinclair Grooms, MD;   Location[facility];  Service: Cardiovascular;  Laterality: N/A;  . PAROTID GLAND TUMOR EXCISION  1976   benign  . PAROTID GLAND TUMOR EXCISION  1976   rt  . SHOULDER ARTHROSCOPY DISTAL CLAVICLE EXCISION AND OPEN ROTATOR CUFF REPAIR  2006  . SHOULDER ARTHROSCOPY W/ ROTATOR CUFF REPAIR  2006  . TOTAL KNEE ARTHROPLASTY Right 02/18/2014  Procedure: TOTAL KNEE ARTHROPLASTY;  Surgeon: Lorn Junes, MD;  Location: [facility];  Service: Orthopedics;  Laterality: Right;   Social History   Occupational History  . Not on file  Tobacco Use  . Smoking status: Never Smoker  . Smokeless tobacco: Never Used  Substance and Sexual Activity  . Alcohol use: Yes    Comment: 1-2 drinks weekly  . Drug use: No  . Sexual activity: Yes    Birth control/protection: Surgical

## 2019-09-14 ENCOUNTER — Encounter: Payer: Self-pay | Admitting: Orthopedic Surgery

## 2019-09-18 ENCOUNTER — Telehealth: Payer: Self-pay | Admitting: Orthopedic Surgery

## 2019-09-18 NOTE — Telephone Encounter (Signed)
Can you please complete blue sheet on this pt and I will bring to cheryl for surgery

## 2019-09-18 NOTE — Telephone Encounter (Signed)
Blue sheet completed.

## 2019-09-18 NOTE — Telephone Encounter (Signed)
Patient called.   Surgery was discussed at the time of her last appointment. She was calling to let us know she would like to move forward with getting the surgery, ASAP   Call back: [phone]

## 2019-09-20 ENCOUNTER — Telehealth: Payer: Self-pay | Admitting: Orthopedic Surgery

## 2019-09-20 NOTE — Telephone Encounter (Signed)
I called and sw pt to advise that the surgical scheduler has the information about surgery and that these are scheduled based of acuity and that we have had several emergent cases that needed to be scheduled and this was an elective procedure but that we would sch her as soon as we are able. Voiced understanding and will call with questions.

## 2019-09-20 NOTE — Telephone Encounter (Signed)
Patient would like to move forward with scheduling the surgery on her right foot.  CB#[phone]

## 2019-09-27 ENCOUNTER — Other Ambulatory Visit: Payer: Self-pay | Admitting: Family Medicine

## 2019-09-27 DIAGNOSIS — I10 Essential (primary) hypertension: Secondary | ICD-10-CM

## 2019-09-30 ENCOUNTER — Other Ambulatory Visit: Payer: Self-pay | Admitting: Physician Assistant

## 2019-10-03 ENCOUNTER — Other Ambulatory Visit: Payer: Self-pay

## 2019-10-03 ENCOUNTER — Encounter (HOSPITAL_BASED_OUTPATIENT_CLINIC_OR_DEPARTMENT_OTHER): Payer: Self-pay | Admitting: Orthopedic Surgery

## 2019-10-04 ENCOUNTER — Other Ambulatory Visit: Payer: Self-pay

## 2019-10-05 ENCOUNTER — Other Ambulatory Visit (HOSPITAL_COMMUNITY)
Admission: RE | Admit: 2019-10-05 | Discharge: 2019-10-05 | Disposition: A | Discharge status: Home / Self Care | Payer: Medicare Other | Source: Ambulatory Visit | Attending: Orthopedic Surgery | Admitting: Orthopedic Surgery

## 2019-10-05 ENCOUNTER — Encounter (HOSPITAL_BASED_OUTPATIENT_CLINIC_OR_DEPARTMENT_OTHER)
Admission: RE | Admit: 2019-10-05 | Discharge: 2019-10-05 | Disposition: A | Discharge status: Home / Self Care | Payer: Medicare Other | Source: Ambulatory Visit | Attending: Orthopedic Surgery | Admitting: Orthopedic Surgery

## 2019-10-05 DIAGNOSIS — G629 Polyneuropathy, unspecified: Secondary | ICD-10-CM | POA: Diagnosis not present

## 2019-10-05 DIAGNOSIS — Z01812 Encounter for preprocedural laboratory examination: Secondary | ICD-10-CM | POA: Insufficient documentation

## 2019-10-05 DIAGNOSIS — Z20822 Contact with and (suspected) exposure to covid-19: Secondary | ICD-10-CM | POA: Insufficient documentation

## 2019-10-05 DIAGNOSIS — M1711 Unilateral primary osteoarthritis, right knee: Secondary | ICD-10-CM | POA: Diagnosis not present

## 2019-10-05 DIAGNOSIS — I10 Essential (primary) hypertension: Secondary | ICD-10-CM | POA: Diagnosis not present

## 2019-10-05 DIAGNOSIS — F419 Anxiety disorder, unspecified: Secondary | ICD-10-CM | POA: Diagnosis not present

## 2019-10-05 DIAGNOSIS — G4730 Sleep apnea, unspecified: Secondary | ICD-10-CM | POA: Diagnosis not present

## 2019-10-05 DIAGNOSIS — M6701 Short Achilles tendon (acquired), right ankle: Secondary | ICD-10-CM | POA: Diagnosis not present

## 2019-10-05 DIAGNOSIS — F329 Major depressive disorder, single episode, unspecified: Secondary | ICD-10-CM | POA: Diagnosis not present

## 2019-10-05 DIAGNOSIS — Z880 Allergy status to penicillin: Secondary | ICD-10-CM | POA: Diagnosis not present

## 2019-10-05 DIAGNOSIS — M205X1 Other deformities of toe(s) (acquired), right foot: Secondary | ICD-10-CM | POA: Diagnosis not present

## 2019-10-05 DIAGNOSIS — Z96651 Presence of right artificial knee joint: Secondary | ICD-10-CM | POA: Diagnosis not present

## 2019-10-05 DIAGNOSIS — E785 Hyperlipidemia, unspecified: Secondary | ICD-10-CM | POA: Diagnosis not present

## 2019-10-05 DIAGNOSIS — R7303 Prediabetes: Secondary | ICD-10-CM | POA: Diagnosis not present

## 2019-10-05 LAB — SARS CORONAVIRUS 2 (TAT 6-24 HRS): SARS Coronavirus 2: NEGATIVE

## 2019-10-05 NOTE — Progress Notes (Signed)

## 2019-10-09 ENCOUNTER — Ambulatory Visit (HOSPITAL_BASED_OUTPATIENT_CLINIC_OR_DEPARTMENT_OTHER)
Admission: RE | Admit: 2019-10-09 | Discharge: 2019-10-09 | Disposition: A | Discharge status: Home / Self Care | Payer: Medicare Other | Attending: Orthopedic Surgery | Admitting: Orthopedic Surgery

## 2019-10-09 ENCOUNTER — Ambulatory Visit (HOSPITAL_BASED_OUTPATIENT_CLINIC_OR_DEPARTMENT_OTHER): Payer: Medicare Other | Admitting: Certified Registered"

## 2019-10-09 ENCOUNTER — Other Ambulatory Visit: Payer: Self-pay

## 2019-10-09 ENCOUNTER — Encounter (HOSPITAL_BASED_OUTPATIENT_CLINIC_OR_DEPARTMENT_OTHER)
Admission: RE | Disposition: A | Discharge status: Home / Self Care | Payer: Self-pay | Source: Home / Self Care | Attending: Orthopedic Surgery

## 2019-10-09 ENCOUNTER — Encounter (HOSPITAL_BASED_OUTPATIENT_CLINIC_OR_DEPARTMENT_OTHER): Payer: Self-pay | Admitting: Orthopedic Surgery

## 2019-10-09 DIAGNOSIS — I10 Essential (primary) hypertension: Secondary | ICD-10-CM | POA: Insufficient documentation

## 2019-10-09 DIAGNOSIS — F419 Anxiety disorder, unspecified: Secondary | ICD-10-CM | POA: Insufficient documentation

## 2019-10-09 DIAGNOSIS — R7303 Prediabetes: Secondary | ICD-10-CM | POA: Insufficient documentation

## 2019-10-09 DIAGNOSIS — Z880 Allergy status to penicillin: Secondary | ICD-10-CM | POA: Insufficient documentation

## 2019-10-09 DIAGNOSIS — F329 Major depressive disorder, single episode, unspecified: Secondary | ICD-10-CM | POA: Insufficient documentation

## 2019-10-09 DIAGNOSIS — Z96651 Presence of right artificial knee joint: Secondary | ICD-10-CM | POA: Insufficient documentation

## 2019-10-09 DIAGNOSIS — G629 Polyneuropathy, unspecified: Secondary | ICD-10-CM | POA: Insufficient documentation

## 2019-10-09 DIAGNOSIS — M6701 Short Achilles tendon (acquired), right ankle: Secondary | ICD-10-CM | POA: Diagnosis not present

## 2019-10-09 DIAGNOSIS — G4730 Sleep apnea, unspecified: Secondary | ICD-10-CM | POA: Insufficient documentation

## 2019-10-09 DIAGNOSIS — M1711 Unilateral primary osteoarthritis, right knee: Secondary | ICD-10-CM | POA: Insufficient documentation

## 2019-10-09 DIAGNOSIS — E785 Hyperlipidemia, unspecified: Secondary | ICD-10-CM | POA: Insufficient documentation

## 2019-10-09 DIAGNOSIS — M205X1 Other deformities of toe(s) (acquired), right foot: Secondary | ICD-10-CM | POA: Diagnosis not present

## 2019-10-09 HISTORY — PX: GASTROCNEMIUS RECESSION: SHX863

## 2019-10-09 HISTORY — DX: Sleep apnea, unspecified: G47.30

## 2019-10-09 HISTORY — PX: WEIL OSTEOTOMY: SHX5044

## 2019-10-09 HISTORY — DX: Prediabetes: R73.03

## 2019-10-09 SURGERY — OSTEOTOMY, WEIL
Anesthesia: General | Site: Toe | Laterality: Right

## 2019-10-09 MED ORDER — MIDAZOLAM HCL 2 MG/2ML IJ SOLN
2.0000 mg | Freq: Once | INTRAMUSCULAR | Status: AC
  Administered 2019-10-09: 2 mg via INTRAVENOUS

## 2019-10-09 MED ORDER — CLINDAMYCIN PHOSPHATE 900 MG/50ML IV SOLN
900.0000 mg | INTRAVENOUS | Status: AC
  Administered 2019-10-09: 900 mg via INTRAVENOUS

## 2019-10-09 MED ORDER — ONDANSETRON HCL 4 MG/2ML IJ SOLN: 4.0000 mg | Freq: Once | INTRAMUSCULAR | Status: DC | PRN

## 2019-10-09 MED ORDER — FENTANYL CITRATE (PF) 100 MCG/2ML IJ SOLN
INTRAMUSCULAR | Status: AC
  Filled 2019-10-09: qty 2

## 2019-10-09 MED ORDER — OXYCODONE-ACETAMINOPHEN 5-325 MG PO TABS
1.0000 | ORAL_TABLET | ORAL | 0 refills | Status: DC | PRN
Start: 2019-10-09 — End: 2019-10-16

## 2019-10-09 MED ORDER — FENTANYL CITRATE (PF) 100 MCG/2ML IJ SOLN
100.0000 ug | Freq: Once | INTRAMUSCULAR | Status: AC
  Administered 2019-10-09: 100 ug via INTRAVENOUS

## 2019-10-09 MED ORDER — CLINDAMYCIN PHOSPHATE 900 MG/50ML IV SOLN
INTRAVENOUS | Status: AC
  Filled 2019-10-09: qty 50

## 2019-10-09 MED ORDER — EPHEDRINE 5 MG/ML INJ
INTRAVENOUS | Status: AC
  Filled 2019-10-09: qty 10

## 2019-10-09 MED ORDER — MEPERIDINE HCL 25 MG/ML IJ SOLN: 6.2500 mg | INTRAMUSCULAR | Status: DC | PRN

## 2019-10-09 MED ORDER — BACITRACIN ZINC 500 UNIT/GM EX OINT
TOPICAL_OINTMENT | CUTANEOUS | Status: AC
  Filled 2019-10-09: qty 28.35

## 2019-10-09 MED ORDER — PROPOFOL 10 MG/ML IV BOLUS
INTRAVENOUS | Status: DC | PRN
  Administered 2019-10-09: 140 mg via INTRAVENOUS

## 2019-10-09 MED ORDER — ONDANSETRON HCL 4 MG/2ML IJ SOLN
INTRAMUSCULAR | Status: DC | PRN
  Administered 2019-10-09: 4 mg via INTRAVENOUS

## 2019-10-09 MED ORDER — DEXAMETHASONE SODIUM PHOSPHATE 10 MG/ML IJ SOLN
INTRAMUSCULAR | Status: DC | PRN
  Administered 2019-10-09: 4 mg via INTRAVENOUS

## 2019-10-09 MED ORDER — BUPIVACAINE-EPINEPHRINE (PF) 0.5% -1:200000 IJ SOLN
INTRAMUSCULAR | Status: DC | PRN
Start: 2019-10-09 — End: 2019-10-09
  Administered 2019-10-09: 30 mL via PERINEURAL

## 2019-10-09 MED ORDER — BUPIVACAINE HCL (PF) 0.25 % IJ SOLN
INTRAMUSCULAR | Status: AC
  Filled 2019-10-09: qty 30

## 2019-10-09 MED ORDER — LIDOCAINE-EPINEPHRINE 1 %-1:100000 IJ SOLN
INTRAMUSCULAR | Status: AC
  Filled 2019-10-09: qty 1

## 2019-10-09 MED ORDER — MIDAZOLAM HCL 2 MG/2ML IJ SOLN
INTRAMUSCULAR | Status: AC
  Filled 2019-10-09: qty 2

## 2019-10-09 MED ORDER — HYDROMORPHONE HCL 1 MG/ML IJ SOLN: 0.2500 mg | INTRAMUSCULAR | Status: DC | PRN

## 2019-10-09 MED ORDER — BUPIVACAINE HCL (PF) 0.5 % IJ SOLN
INTRAMUSCULAR | Status: AC
  Filled 2019-10-09: qty 30

## 2019-10-09 MED ORDER — LACTATED RINGERS IV SOLN: INTRAVENOUS | Status: DC

## 2019-10-09 MED ORDER — EPHEDRINE SULFATE 50 MG/ML IJ SOLN
INTRAMUSCULAR | Status: DC | PRN
  Administered 2019-10-09 (×2): 5 mg via INTRAVENOUS

## 2019-10-09 MED ORDER — LIDOCAINE HCL (CARDIAC) PF 100 MG/5ML IV SOSY
PREFILLED_SYRINGE | INTRAVENOUS | Status: DC | PRN
  Administered 2019-10-09: 100 mg via INTRAVENOUS

## 2019-10-09 MED ORDER — LIDOCAINE-EPINEPHRINE (PF) 1.5 %-1:200000 IJ SOLN
INTRAMUSCULAR | Status: DC | PRN
Start: 2019-10-09 — End: 2019-10-09
  Administered 2019-10-09: 30 mL via PERINEURAL

## 2019-10-09 SURGICAL SUPPLY — 56 items
BLADE OSC/SAG .038X5.5 CUT EDG (BLADE) ×6 IMPLANT
BLADE SURG 10 STRL SS (BLADE) ×3 IMPLANT
BLADE SURG 15 STRL LF DISP TIS (BLADE) ×4 IMPLANT
BLADE SURG 15 STRL SS (BLADE) ×6
BNDG CMPR 9X4 STRL LF SNTH (GAUZE/BANDAGES/DRESSINGS)
BNDG COHESIVE 4X5 TAN STRL (GAUZE/BANDAGES/DRESSINGS) ×3 IMPLANT
BNDG COHESIVE 6X5 TAN STRL LF (GAUZE/BANDAGES/DRESSINGS) IMPLANT
BNDG ESMARK 4X9 LF (GAUZE/BANDAGES/DRESSINGS) IMPLANT
BNDG GAUZE ELAST 4 BULKY (GAUZE/BANDAGES/DRESSINGS) ×3 IMPLANT
COVER BACK TABLE 60X90IN (DRAPES) ×3 IMPLANT
COVER WAND RF STERILE (DRAPES) IMPLANT
DECANTER SPIKE VIAL GLASS SM (MISCELLANEOUS) IMPLANT
DRAPE EXTREMITY T 121X128X90 (DISPOSABLE) ×3 IMPLANT
DRAPE IMP U-DRAPE 54X76 (DRAPES) ×3 IMPLANT
DRAPE OEC MINIVIEW 54X84 (DRAPES) IMPLANT
DRAPE U-SHAPE 47X51 STRL (DRAPES) ×3 IMPLANT
DRSG EMULSION OIL 3X3 NADH (GAUZE/BANDAGES/DRESSINGS) ×3 IMPLANT
DRSG PAD ABDOMINAL 8X10 ST (GAUZE/BANDAGES/DRESSINGS) ×2 IMPLANT
DURAPREP 26ML APPLICATOR (WOUND CARE) ×3 IMPLANT
ELECT REM PT RETURN 9FT ADLT (ELECTROSURGICAL) ×3
ELECTRODE REM PT RTRN 9FT ADLT (ELECTROSURGICAL) ×2 IMPLANT
GAUZE 4X4 16PLY RFD (DISPOSABLE) IMPLANT
GAUZE SPONGE 4X4 12PLY STRL (GAUZE/BANDAGES/DRESSINGS) ×3 IMPLANT
GLOVE BIOGEL M 6.5 STRL (GLOVE) ×1 IMPLANT
GLOVE BIOGEL PI IND STRL 6.5 (GLOVE) IMPLANT
GLOVE BIOGEL PI IND STRL 9 (GLOVE) ×2 IMPLANT
GLOVE BIOGEL PI INDICATOR 6.5 (GLOVE) ×3
GLOVE BIOGEL PI INDICATOR 9 (GLOVE) ×1
GLOVE SURG ORTHO 9.0 STRL STRW (GLOVE) ×3 IMPLANT
GLOVE SURG SS PI 7.0 STRL IVOR (GLOVE) ×1 IMPLANT
GOWN STRL REUS W/ TWL LRG LVL3 (GOWN DISPOSABLE) ×2 IMPLANT
GOWN STRL REUS W/ TWL XL LVL3 (GOWN DISPOSABLE) ×2 IMPLANT
GOWN STRL REUS W/TWL LRG LVL3 (GOWN DISPOSABLE) ×3
GOWN STRL REUS W/TWL XL LVL3 (GOWN DISPOSABLE) ×6 IMPLANT
NDL HYPO 25X1 1.5 SAFETY (NEEDLE) ×2 IMPLANT
NDL SPNL 18GX3.5 QUINCKE PK (NEEDLE) IMPLANT
NEEDLE HYPO 25X1 1.5 SAFETY (NEEDLE) ×3 IMPLANT
NEEDLE SPNL 18GX3.5 QUINCKE PK (NEEDLE) IMPLANT
NS IRRIG 1000ML POUR BTL (IV SOLUTION) ×3 IMPLANT
PAD CAST 4YDX4 CTTN HI CHSV (CAST SUPPLIES) ×2 IMPLANT
PADDING CAST ABS 4INX4YD NS (CAST SUPPLIES) ×1
PADDING CAST ABS COTTON 4X4 ST (CAST SUPPLIES) ×2 IMPLANT
PADDING CAST COTTON 4X4 STRL (CAST SUPPLIES) ×3
PENCIL SMOKE EVACUATOR (MISCELLANEOUS) ×3 IMPLANT
SCREW CORTEX ST 2.0X12 (Screw) ×1 IMPLANT
SET BASIN DAY SURGERY F.S. (CUSTOM PROCEDURE TRAY) ×3 IMPLANT
SPONGE LAP 18X18 RF (DISPOSABLE) ×3 IMPLANT
STOCKINETTE 6  STRL (DRAPES) ×3
STOCKINETTE 6 STRL (DRAPES) ×2 IMPLANT
SUT ETHILON 2 0 FSLX (SUTURE) ×3 IMPLANT
SUT ETHILON 2 0 PSLX (SUTURE) ×3 IMPLANT
SUT VIC AB 2-0 CT1 27 (SUTURE)
SUT VIC AB 2-0 CT1 TAPERPNT 27 (SUTURE) IMPLANT
SYR BULB EAR ULCER 3OZ GRN STR (SYRINGE) ×3 IMPLANT
SYR CONTROL 10ML LL (SYRINGE) ×3 IMPLANT
TOWEL GREEN STERILE FF (TOWEL DISPOSABLE) ×3 IMPLANT

## 2019-10-09 NOTE — Anesthesia Preprocedure Evaluation (Signed)
Anesthesia Evaluation  Patient identified by MRN, date of birth, ID band Patient awake    Reviewed: Allergy & Precautions, NPO status , Patient's Chart, lab work & pertinent test results  Airway Mallampati: I  TM Distance: >3 FB Neck ROM: Full    Dental   Pulmonary sleep apnea and Continuous Positive Airway Pressure Ventilation ,    Pulmonary exam normal        Cardiovascular hypertension, Pt. on medications Normal cardiovascular exam     Neuro/Psych Anxiety Depression    GI/Hepatic   Endo/Other    Renal/GU      Musculoskeletal   Abdominal   Peds  Hematology   Anesthesia Other Findings   Reproductive/Obstetrics                             Anesthesia Physical Anesthesia Plan  ASA: III  Anesthesia Plan: General   Post-op Pain Management:  Regional for Post-op pain   Induction:   PONV Risk Score and Plan: 3 and Ondansetron, Midazolam and Treatment may vary due to age or medical condition  Airway Management Planned: LMA  Additional Equipment:   Intra-op Plan:   Post-operative Plan: Extubation in OR  Informed Consent: I have reviewed the patients History and Physical, chart, labs and discussed the procedure including the risks, benefits and alternatives for the proposed anesthesia with the patient or authorized representative who has indicated his/her understanding and acceptance.       Plan Discussed with: CRNA and Surgeon  Anesthesia Plan Comments:         Anesthesia Quick Evaluation

## 2019-10-09 NOTE — Progress Notes (Signed)
Assisted Dr. Conrad  with right, ultrasound guided, popliteal/saphenous block. Side rails up, monitors on throughout procedure. See vital signs in flow sheet. Tolerated Procedure well.

## 2019-10-09 NOTE — Anesthesia Procedure Notes (Signed)
Anesthesia Regional Block: Popliteal block   Pre-Anesthetic Checklist: ,, timeout performed, Correct Patient, Correct Site, Correct Laterality, Correct Procedure, Correct Position, site marked, Risks and benefits discussed,  Surgical consent,  Pre-op evaluation,  At surgeon's request and post-op pain management  Laterality: Right  Prep: chloraprep       Needles:  Injection technique: Single-shot  Needle Type: Echogenic Stimulator Needle     Needle Length: 10cm  Needle Gauge: 21     Additional Needles:   Procedures:, nerve stimulator,,,,,,,   Nerve Stimulator or Paresthesia:  Response: 0.4 mA,   Additional Responses:   Narrative:  Start time: 10/09/2019 7:07 AM End time: 10/09/2019 7:17 AM Injection made incrementally with aspirations every 5 mL.  Performed by: Personally  Anesthesiologist: Lillia Abed, MD  Additional Notes: Monitors applied. Patient sedated. Sterile prep and drape,hand hygiene and sterile gloves were used. Relevant anatomy identified.Needle position confirmed.Local anesthetic injected incrementally after negative aspiration. Local anesthetic spread visualized around nerve(s). Vascular puncture avoided. No complications. Image printed for medical record.The patient tolerated the procedure well.  Additional Saphenous nerve block performed. 15cc Local Anesthetic mixture placed under ultrasonic guidance along the medio-inferior border of the Sartorious muscle 6 inches above the knee.  No Problems encountered.  Lillia Abed MD

## 2019-10-09 NOTE — Discharge Instructions (Signed)
  Post Anesthesia Home Care Instructions  Activity: Get plenty of rest for the remainder of the day. A responsible individual must stay with you for 24 hours following the procedure.  For the next 24 hours, DO NOT: -Drive a car -Paediatric nurse -Drink alcoholic beverages -Take any medication unless instructed by your physician -Make any legal decisions or sign important papers.  Meals: Start with liquid foods such as gelatin or soup. Progress to regular foods as tolerated. Avoid greasy, spicy, heavy foods. If nausea and/or vomiting occur, drink only clear liquids until the nausea and/or vomiting subsides. Call your physician if vomiting continues.  Special Instructions/Symptoms: Your throat may feel dry or sore from the anesthesia or the breathing tube placed in your throat during surgery. If this causes discomfort, gargle with warm salt water. The discomfort should disappear within 24 hours.      Call your surgeon if you experience:   1.  Fever over 101.0. 2.  Inability to urinate. 3.  Nausea and/or vomiting. 4.  Extreme swelling or bruising at the surgical site. 5.  Continued bleeding from the incision. 6.  Increased pain, redness or drainage from the incision. 7.  Problems related to your pain medication. 8.  Any problems and/or concerns  Regional Anesthesia Blocks  1. Numbness or the inability to move the "blocked" extremity may last from 3-48 hours after placement. The length of time depends on the medication injected and your individual response to the medication. If the numbness is not going away after 48 hours, call your surgeon.  2. The extremity that is blocked will need to be protected until the numbness is gone and the  Strength has returned. Because you cannot feel it, you will need to take extra care to avoid injury. Because it may be weak, you may have difficulty moving it or using it. You may not know what position it is in without looking at it while the block is  in effect.  3. For blocks in the legs and feet, returning to weight bearing and walking needs to be done carefully. You will need to wait until the numbness is entirely gone and the strength has returned. You should be able to move your leg and foot normally before you try and bear weight or walk. You will need someone to be with you when you first try to ensure you do not fall and possibly risk injury.  4. Bruising and tenderness at the needle site are common side effects and will resolve in a few days.  5. Persistent numbness or new problems with movement should be communicated to the surgeon or the [facility] [phone] [place] [phone]).

## 2019-10-09 NOTE — H&P (Signed)
Monica Kane is an 74 y.o. female.   Chief Complaint: right foot metatarsalgia HPI: Patient is a 74 year old woman who presents with 1 year history of right forefoot pain that she states is getting worse.  She states that the pain starts in the second toe and radiates up her foot she has tried Stretching orthotics physical therapy.  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  Patient states that she has decreased sleeping due to the pain she states it gets better with rest.  Past Medical History:  Diagnosis Date  . Anxiety    takes Ativan nightly  . Depression    takes Zoloft daily  . History of colon polyps   . History of kidney stones   . History of staph infection 11/2013  . Hyperlipidemia    takes Crestor nightly   . Hypertension    takes Lisinopril and Coreg daily  . Joint pain   . Joint swelling   . Neuropathy    takes Elavil nightly  . Pneumonia   . Pre-diabetes   . Primary localized osteoarthritis of right knee   . Sleep apnea    uses oral appliance    Past Surgical History:  Procedure Laterality Date  . ABDOMINAL HYSTERECTOMY  1989  . APPENDECTOMY    . CARDIAC CATHETERIZATION  2015  . CERVICAL SPINE SURGERY  2004   C 5-6  . COLONOSCOPY    . colonscopy    . DIAGNOSTIC LAPAROSCOPY  1976   fibroid removed  . ELBOW ARTHROSCOPY  2010, 2012  . EYE SURGERY Bilateral    cataract  . FOOT SURGERY Left 2013  . KNEE ARTHROSCOPY  Oct 2014  . KNEE ARTHROSCOPY W/ MENISCECTOMY Right 01/31/2013   [facility]  . LATERAL EPICONDYLE RELEASE  03/01/2011   Procedure: TENNIS ELBOW RELEASE;  Surgeon: Lorn Junes, MD;  Location: [facility];  Service: Orthopedics;  Laterality: Left;  TENOTOMY ELBOW LATERAL EPICONDYLITIS TENNIS ELBOW  . lateral release right elbow  2010  . left foot surgery  2013   mortons neuroma  . LEFT HEART CATHETERIZATION WITH CORONARY ANGIOGRAM N/A 07/23/2013   Procedure: LEFT HEART  CATHETERIZATION WITH CORONARY ANGIOGRAM;  Surgeon: Sinclair Grooms, MD;  Location: [facility] CATH LAB;  Service: Cardiovascular;  Laterality: N/A;  . PAROTID GLAND TUMOR EXCISION  1976   benign  . PAROTID GLAND TUMOR EXCISION  1976   rt  . SHOULDER ARTHROSCOPY DISTAL CLAVICLE EXCISION AND OPEN ROTATOR CUFF REPAIR  2006  . SHOULDER ARTHROSCOPY W/ ROTATOR CUFF REPAIR  2006  . TOTAL KNEE ARTHROPLASTY Right 02/18/2014   Procedure: TOTAL KNEE ARTHROPLASTY;  Surgeon: Lorn Junes, MD;  Location: [facility];  Service: Orthopedics;  Laterality: Right;    Family History  Problem Relation Age of Onset  . Cancer Mother        lung  . Cancer Father        lung; + tobacco  . Heart disease Maternal Grandfather        heart attack  . Heart disease Paternal Grandfather        heart attack  . Diabetes Sister   . Hypertension Sister   . Hyperlipidemia Sister   . Breast cancer Neg Hx    Social History:  reports that she has never smoked. She has never used smokeless tobacco. She reports current alcohol use. She reports that she does not use drugs.  Allergies:  Allergies  Allergen Reactions  . Penicillins Swelling and Rash    No medications prior to admission.    No results found for this or any previous visit (from the past 48 hour(s)). No results found.  Review of Systems  All other systems reviewed and are negative.   Height 5\' 4"  (1.626 m), weight 69.4 kg. Physical Exam  Patient is alert, oriented, no adenopathy, well-dressed, normal affect, normal respiratory effort. Examination patient has good pulses she has dorsiflexion to neutral with her knee extended.  Patient does have swelling around the MTP joint and forced dorsiflexion reproduces pain on the plantar aspect of the MTP joint the plantar aspect of the MTP joint is tender to palpation. Lungs Clear Heart RRR Assessment/Plan Patient will continue with her conservative options with the rocker-bottom shoes and Achilles stretching.   Discussed that if she wanted to consider surgical intervention we could plan on a gastrocnemius recession Weil osteotomy of the second metatarsal and possible plantar plate repair at the MTP joint.  With the pain and swelling patient most likely has a plantar plate injury.   Bevely Palmer Mable Dara, PA 10/09/2019, 5:59 AM

## 2019-10-09 NOTE — Anesthesia Procedure Notes (Signed)
Procedure Name: LMA Insertion Date/Time: 10/09/2019 7:44 AM Performed by: Lavonia Dana, CRNA Pre-anesthesia Checklist: Patient identified, Emergency Drugs available, Suction available and Patient being monitored Patient Re-evaluated:Patient Re-evaluated prior to induction Oxygen Delivery Method: Circle system utilized Preoxygenation: Pre-oxygenation with 100% oxygen Induction Type: IV induction Ventilation: Mask ventilation without difficulty LMA: LMA inserted LMA Size: 4.0 Number of attempts: 1 Airway Equipment and Method: Bite block Placement Confirmation: positive ETCO2 Tube secured with: Tape Dental Injury: Teeth and Oropharynx as per pre-operative assessment

## 2019-10-09 NOTE — Anesthesia Postprocedure Evaluation (Signed)
Anesthesia Post Note  Patient: GRAYCIE HALLEY  Procedure(s) Performed: WEIL OSTEOTOMY RIGHT 2ND METATARSAL (Right Toe) RIGHT GASTROCNEMIUS RECESSION (Right )     Patient location during evaluation: PACU Anesthesia Type: General Level of consciousness: awake and alert Pain management: pain level controlled Vital Signs Assessment: post-procedure vital signs reviewed and stable Respiratory status: spontaneous breathing, nonlabored ventilation, respiratory function stable and patient connected to nasal cannula oxygen Cardiovascular status: blood pressure returned to baseline and stable Postop Assessment: no apparent nausea or vomiting Anesthetic complications: no   No complications documented.  Last Vitals:  Vitals:   10/09/19 0900 10/09/19 0945  BP: (!) 154/67 (!) 163/52  Pulse: 63 66  Resp: 17 16  Temp:  36.4 C  SpO2: 97% 95%    Last Pain:  Vitals:   10/09/19 0945  TempSrc:   PainSc: 0-No pain                 Ewan Grau DAVID

## 2019-10-09 NOTE — Op Note (Signed)
10/09/2019  8:17 AM  PATIENT:  Monica Kane    PRE-OPERATIVE DIAGNOSIS:  Right Claw Toe with Achilles Contracture  POST-OPERATIVE DIAGNOSIS:  Same  PROCEDURE:  WEIL OSTEOTOMY RIGHT 2ND METATARSAL, RIGHT GASTROCNEMIUS RECESSION  SURGEON:  Newt Minion, MD  PHYSICIAN ASSISTANT:None ANESTHESIA:   General  PREOPERATIVE INDICATIONS:  Monica Kane is a  73 y.o. female with a diagnosis of Right Claw Toe with Achilles Contracture who failed conservative measures and elected for surgical management.    The risks benefits and alternatives were discussed with the patient preoperatively including but not limited to the risks of infection, bleeding, nerve injury, cardiopulmonary complications, the need for revision surgery, among others, and the patient was willing to proceed.  OPERATIVE IMPLANTS: 2 x 12 mm mini frag screw  @ENCIMAGES @  OPERATIVE FINDINGS: Large chondral flap of the second metatarsal head  OPERATIVE PROCEDURE: Patient brought the operating room after undergoing a regional block she did underwent a general anesthetic.  After adequate levels anesthesia were obtained patient's right lower extremity was prepped using DuraPrep draped into a sterile field a timeout was called.  A medial longitudinal incision was made over the gastrocnemius muscle 15 cm proximal to the tip of the medial malleolus.  Blunt dissection was carried down to the gastrocnemius fascia and the fascia was released under direct visualization.  This took her dorsiflexion from just short of neutral to about 30 degrees past neutral.  The wound was irrigated normal saline and the incision was closed using 2-0 nylon.  Attention was then focused on the second metatarsal.  A dorsal incision was made over the MTP joint.  The capsule was incised retractors were placed patient had a large cartilage flap involving almost the entire surface of the metatarsal head.  This was removed.  A Weil osteotomy was performed the  metatarsal head was translated proximally and the overhanging bone was resected this was stabilized with a 2 x 12 mm mini frag screw.  The wound was irrigated normal saline the extensor mechanism was left intact and the incision was closed using 2-0 nylon.  A sterile dressing was applied patient was extubated taken the PACU in stable condition.   DISCHARGE PLANNING:  Antibiotic duration: Preoperative antibiotics  Weightbearing: Touchdown weightbearing on the right  Pain medication: Prescription for Percocet  Dressing care/ Wound VAC: Follow-up in 1 week to change the dressing  Ambulatory devices: Patient has a walker at home  Discharge to: Home.  Follow-up: In the office 1 week post operative.

## 2019-10-09 NOTE — Transfer of Care (Signed)
Immediate Anesthesia Transfer of Care Note  Patient: Monica Kane  Procedure(s) Performed: WEIL OSTEOTOMY RIGHT 2ND METATARSAL (Right Toe) RIGHT GASTROCNEMIUS RECESSION (Right )  Patient Location: PACU  Anesthesia Type:GA combined with regional for post-op pain  Level of Consciousness: awake, alert  and oriented  Airway & Oxygen Therapy: Patient Spontanous Breathing and Patient connected to face mask oxygen  Post-op Assessment: Report given to RN and Post -op Vital signs reviewed and stable  Post vital signs: Reviewed and stable  Last Vitals:  Vitals Value Taken Time  BP 195/93 10/09/19 0821  Temp    Pulse 68 10/09/19 0824  Resp 16 10/09/19 0824  SpO2 100 % 10/09/19 0824  Vitals shown include unvalidated device data.  Last Pain:  Vitals:   10/09/19 0642  TempSrc: Oral  PainSc: 1       Patients Stated Pain Goal: 4 (59/45/85 [id number])  Complications: No complications documented.

## 2019-10-10 ENCOUNTER — Telehealth: Payer: Self-pay | Admitting: Orthopedic Surgery

## 2019-10-10 ENCOUNTER — Encounter (HOSPITAL_BASED_OUTPATIENT_CLINIC_OR_DEPARTMENT_OTHER): Payer: Self-pay | Admitting: Orthopedic Surgery

## 2019-10-10 NOTE — Telephone Encounter (Signed)
Patient had surgery on Tuesday and may have to go out of town on an emergency and wanted to know if it will be okay for her foot to ride in a car.  CB#[phone].  Thank you.

## 2019-10-10 NOTE — Telephone Encounter (Signed)
I called pt and lm on vm to advise that if she could ride in the back seat with her foot elevated that would be ideal. To call with any other questions.

## 2019-10-11 ENCOUNTER — Encounter: Payer: Self-pay | Admitting: Orthopedic Surgery

## 2019-10-16 ENCOUNTER — Other Ambulatory Visit: Payer: Self-pay

## 2019-10-16 ENCOUNTER — Ambulatory Visit (INDEPENDENT_AMBULATORY_CARE_PROVIDER_SITE_OTHER): Payer: Medicare Other | Admitting: Physician Assistant

## 2019-10-16 ENCOUNTER — Encounter: Payer: Self-pay | Admitting: Physician Assistant

## 2019-10-16 ENCOUNTER — Other Ambulatory Visit: Payer: Self-pay | Admitting: Physician Assistant

## 2019-10-16 VITALS — Ht 65.0 in | Wt 161.6 lb

## 2019-10-16 DIAGNOSIS — M205X1 Other deformities of toe(s) (acquired), right foot: Secondary | ICD-10-CM

## 2019-10-16 MED ORDER — OXYCODONE-ACETAMINOPHEN 5-325 MG PO TABS: 1.0000 | ORAL_TABLET | ORAL | 0 refills | Status: DC | PRN

## 2019-10-16 NOTE — Progress Notes (Signed)
Office Visit Note   Patient: Monica Kane           Date of Birth: [birth year]           MRN: [id number] Visit Date: 10/16/2019              Requested by: Darreld Mclean, MD [address] [place],  [place] [id number] PCP: Darreld Mclean, MD  Chief Complaint  Patient presents with  . Right Foot - Routine Post Op    10/09/19 WEIL OSTEO Right 2nd Metatarsal      HPI: The patient is a very pleasant 74 year old woman who is 1 week status post right second Weil osteotomy she is doing well.  She is using very little pain medication  Assessment & Plan: Visit Diagnoses: No diagnosis found.  Plan: We will remove the sutures in 1 week obtain x-rays of her foot.  She may cleanse with Dial soap and water.  She may work on gentle plantar stretching of the toes.  Follow-Up Instructions: No follow-ups on file.   Ortho Exam  Patient is alert, oriented, no adenopathy, well-dressed, normal affect, normal respiratory effort. Well-healing surgical incisio on the medial aspect of her calf as well as the top of her foot.  Strong pulses.  She has no swelling.  Compartments are soft and compressible no cellulitis or joint    Imaging: No results found. No images are attached to the encounter.  Labs: Lab Results  Component Value Date   HGBA1C 6.3 07/26/2019   HGBA1C 6.3 10/31/2017   HGBA1C 5.7 (H) 08/12/2016   REPTSTATUS 01/18/2014 FINAL 01/17/2014   GRAMSTAIN No WBC Seen 01/02/2014   GRAMSTAIN Rare Squamous Epithelial Cells Present 01/02/2014   GRAMSTAIN No Organisms Seen 01/02/2014   CULT  01/17/2014    INSIGNIFICANT GROWTH Performed at [facility] 09/10/2015     Lab Results  Component Value Date   ALBUMIN 4.4 07/26/2019   ALBUMIN 4.0 09/25/2018   ALBUMIN 4.1 10/31/2017    No results found for: MG Lab Results  Component Value Date   VD25OH 15.42 (L) 07/26/2019   VD25OH 38.29 09/10/2015   VD25OH 16 (L)  06/03/2015    No results found for: PREALBUMIN CBC EXTENDED Latest Ref Rng & Units 07/26/2019 09/25/2018 09/25/2018  WBC 4.0 - 10.5 K/uL 5.3 - 5.3  RBC 3.87 - 5.11 Mil/uL 5.23(H) - 4.90  HGB 12.0 - 15.0 g/dL 13.8 13.6 12.9  HCT 36 - 46 % 42.7 40.0 40.6  PLT 150 - 400 K/uL 219.0 - 178  NEUTROABS 1.7 - 7.7 K/uL - - 3.3  LYMPHSABS 0.7 - 4.0 K/uL - - 1.5     Body mass index is 26.89 kg/m.  Orders:  No orders of the defined types were placed in this encounter.  No orders of the defined types were placed in this encounter.    Procedures: No procedures performed  Clinical Data: No additional findings.  ROS:  All other systems negative, except as noted in the HPI. Review of Systems  Objective: Vital Signs: Ht 5\' 5"  (1.651 m)   Wt 161 lb 9.6 oz (73.3 kg)   BMI 26.89 kg/m   Specialty Comments:  No specialty comments available.  PMFS History: Patient Active Problem List   Diagnosis Date Noted  . Acquired claw toe, right   . Achilles tendon contracture, right   . OSA (obstructive sleep apnea) 07/26/2019  . Vertigo 10/03/2018  .  Visual blurriness 10/03/2018  . Pre-diabetes 06/05/2015  . Primary localized osteoarthritis of right knee   . Anxiety   . Chest pain, midsternal 07/23/2013  . Osteopenia 03/23/2013  . Kidney stones, calcium oxalate 03/23/2013  . Hypertension 03/31/2011  . Hyperlipidemia 03/31/2011  . Vitamin D deficiency 03/31/2011  . Hallux rigidus 03/24/2011  . Metatarsalgia of left foot 03/24/2011  . Lateral epicondylitis of left elbow 02/24/2011  . Migraine 02/24/2011  . Depression 02/24/2011  . Arthritis 02/24/2011   Past Medical History:  Diagnosis Date  . Anxiety    takes Ativan nightly  . Depression    takes Zoloft daily  . History of colon polyps   . History of kidney stones   . History of staph infection 11/2013  . Hyperlipidemia    takes Crestor nightly   . Hypertension    takes Lisinopril and Coreg daily  . Joint pain   . Joint  swelling   . Neuropathy    takes Elavil nightly  . Pneumonia   . Pre-diabetes   . Primary localized osteoarthritis of right knee   . Sleep apnea    uses oral appliance    Family History  Problem Relation Age of Onset  . Cancer Mother        lung  . Cancer Father        lung; + tobacco  . Heart disease Maternal Grandfather        heart attack  . Heart disease Paternal Grandfather        heart attack  . Diabetes Sister   . Hypertension Sister   . Hyperlipidemia Sister   . Breast cancer Neg Hx     Past Surgical History:  Procedure Laterality Date  . ABDOMINAL HYSTERECTOMY  1989  . APPENDECTOMY    . CARDIAC CATHETERIZATION  2015  . CERVICAL SPINE SURGERY  2004   C 5-6  . COLONOSCOPY    . colonscopy    . DIAGNOSTIC LAPAROSCOPY  1976   fibroid removed  . ELBOW ARTHROSCOPY  2010, 2012  . EYE SURGERY Bilateral    cataract  . FOOT SURGERY Left 2013  . GASTROCNEMIUS RECESSION Right 10/09/2019   Procedure: RIGHT GASTROCNEMIUS RECESSION;  Surgeon: Newt Minion, MD;  Location: [facility];  Service: Orthopedics;  Laterality: Right;  . KNEE ARTHROSCOPY  Oct 2014  . KNEE ARTHROSCOPY W/ MENISCECTOMY Right 01/31/2013   [facility]  . LATERAL EPICONDYLE RELEASE  03/01/2011   Procedure: TENNIS ELBOW RELEASE;  Surgeon: Lorn Junes, MD;  Location: [facility];  Service: Orthopedics;  Laterality: Left;  TENOTOMY ELBOW LATERAL EPICONDYLITIS TENNIS ELBOW  . lateral release right elbow  2010  . left foot surgery  2013   mortons neuroma  . LEFT HEART CATHETERIZATION WITH CORONARY ANGIOGRAM N/A 07/23/2013   Procedure: LEFT HEART CATHETERIZATION WITH CORONARY ANGIOGRAM;  Surgeon: Sinclair Grooms, MD;  Location[facility];  Service: Cardiovascular;  Laterality: N/A;  . PAROTID GLAND TUMOR EXCISION  1976   benign  . PAROTID GLAND TUMOR EXCISION  1976   rt  . SHOULDER ARTHROSCOPY DISTAL CLAVICLE EXCISION AND OPEN ROTATOR CUFF REPAIR  2006  . SHOULDER ARTHROSCOPY  W/ ROTATOR CUFF REPAIR  2006  . TOTAL KNEE ARTHROPLASTY Right 02/18/2014   Procedure: TOTAL KNEE ARTHROPLASTY;  Surgeon: Lorn Junes, MD;  Location: [facility];  Service: Orthopedics;  Laterality: Right;  . WEIL OSTEOTOMY Right 10/09/2019   Procedure: WEIL OSTEOTOMY RIGHT 2ND METATARSAL;  Surgeon: Newt Minion, MD;  Location: [facility];  Service: Orthopedics;  Laterality: Right;   Social History   Occupational History  . Not on file  Tobacco Use  . Smoking status: Never Smoker  . Smokeless tobacco: Never Used  Substance and Sexual Activity  . Alcohol use: Yes    Comment: social  . Drug use: No  . Sexual activity: Yes    Birth control/protection: Surgical

## 2019-10-25 ENCOUNTER — Ambulatory Visit (INDEPENDENT_AMBULATORY_CARE_PROVIDER_SITE_OTHER): Payer: Medicare Other | Admitting: Physician Assistant

## 2019-10-25 ENCOUNTER — Encounter: Payer: Self-pay | Admitting: Orthopedic Surgery

## 2019-10-25 ENCOUNTER — Other Ambulatory Visit: Payer: Self-pay

## 2019-10-25 VITALS — Ht 65.0 in | Wt 161.0 lb

## 2019-10-25 DIAGNOSIS — M205X1 Other deformities of toe(s) (acquired), right foot: Secondary | ICD-10-CM

## 2019-10-25 NOTE — Progress Notes (Signed)
Office Visit Note   Patient: Monica Kane           Date of Birth: 06-26-1945           MRN: [id number] Visit Date: 10/25/2019              Requested by: Darreld Mclean, MD [address] [place],  [place] [address] PCP: Darreld Mclean, MD  Chief Complaint  Patient presents with  . Right Foot - Routine Post Op    10/09/19 Weil osteotomy 2nd toe       HPI: This is a pleasant woman who is over 2 weeks status post right foot second metatarsal Weil osteotomy.  She has been working on plantarflexion stretching.  She has no complaints.  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  Plan: Patient will begin Achilles stretching continue with plantarflexion stretching.  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  She will follow-up in 1 month.  She may return to ballroom dancing about at that time as tolerated  Follow-Up Instructions: No follow-ups on file.   Ortho Exam  Patient is alert, oriented, no adenopathy, well-dressed, normal affect, normal respiratory effort. Well-healed surgical incisions on her calf and in her foot.  No swelling distal CMS is intact well opposed and healed wound edges sutures were harvested today no cellulitis swelling well controlled compartments are soft  Imaging: No results found. No images are attached to the encounter.  Labs: Lab Results  Component Value Date   HGBA1C 6.3 07/26/2019   HGBA1C 6.3 10/31/2017   HGBA1C 5.7 (H) 08/12/2016   REPTSTATUS 01/18/2014 FINAL 01/17/2014   GRAMSTAIN No WBC Seen 01/02/2014   GRAMSTAIN Rare Squamous Epithelial Cells Present 01/02/2014   GRAMSTAIN No Organisms Seen 01/02/2014   CULT  01/17/2014    INSIGNIFICANT GROWTH Performed at [facility] 09/10/2015     Lab Results  Component Value Date   ALBUMIN 4.4 07/26/2019   ALBUMIN 4.0 09/25/2018   ALBUMIN 4.1  10/31/2017    No results found for: MG Lab Results  Component Value Date   VD25OH 15.42 (L) 07/26/2019   VD25OH 38.29 09/10/2015   VD25OH 16 (L) 06/03/2015    No results found for: PREALBUMIN CBC EXTENDED Latest Ref Rng & Units 07/26/2019 09/25/2018 09/25/2018  WBC 4.0 - 10.5 K/uL 5.3 - 5.3  RBC 3.87 - 5.11 Mil/uL 5.23(H) - 4.90  HGB 12.0 - 15.0 g/dL 13.8 13.6 12.9  HCT 36 - 46 % 42.7 40.0 40.6  PLT 150 - 400 K/uL 219.0 - 178  NEUTROABS 1.7 - 7.7 K/uL - - 3.3  LYMPHSABS 0.7 - 4.0 K/uL - - 1.5     Body mass index is 26.79 kg/m.  Orders:  No orders of the defined types were placed in this encounter.  No orders of the defined types were placed in this encounter.    Procedures: No procedures performed  Clinical Data: No additional findings.  ROS:  All other systems negative, except as noted in the HPI. Review of Systems  Objective: Vital Signs: Ht 5\' 5"  (1.651 m)   Wt 161 lb (73 kg)   BMI 26.79 kg/m   Specialty Comments:  No specialty comments available.  PMFS History: Patient Active Problem List   Diagnosis Date Noted  . Acquired claw toe, right   . Achilles tendon contracture, right   .  OSA (obstructive sleep apnea) 07/26/2019  . Vertigo 10/03/2018  . Visual blurriness 10/03/2018  . Pre-diabetes 06/05/2015  . Primary localized osteoarthritis of right knee   . Anxiety   . Chest pain, midsternal 07/23/2013  . Osteopenia 03/23/2013  . Kidney stones, calcium oxalate 03/23/2013  . Hypertension 03/31/2011  . Hyperlipidemia 03/31/2011  . Vitamin D deficiency 03/31/2011  . Hallux rigidus 03/24/2011  . Metatarsalgia of left foot 03/24/2011  . Lateral epicondylitis of left elbow 02/24/2011  . Migraine 02/24/2011  . Depression 02/24/2011  . Arthritis 02/24/2011   Past Medical History:  Diagnosis Date  . Anxiety    takes Ativan nightly  . Depression    takes Zoloft daily  . History of colon polyps   . History of kidney stones   . History of staph  infection 11/2013  . Hyperlipidemia    takes Crestor nightly   . Hypertension    takes Lisinopril and Coreg daily  . Joint pain   . Joint swelling   . Neuropathy    takes Elavil nightly  . Pneumonia   . Pre-diabetes   . Primary localized osteoarthritis of right knee   . Sleep apnea    uses oral appliance    Family History  Problem Relation Age of Onset  . Cancer Mother        lung  . Cancer Father        lung; + tobacco  . Heart disease Maternal Grandfather        heart attack  . Heart disease Paternal Grandfather        heart attack  . Diabetes Sister   . Hypertension Sister   . Hyperlipidemia Sister   . Breast cancer Neg Hx     Past Surgical History:  Procedure Laterality Date  . ABDOMINAL HYSTERECTOMY  1989  . APPENDECTOMY    . CARDIAC CATHETERIZATION  2015  . CERVICAL SPINE SURGERY  2004   C 5-6  . COLONOSCOPY    . colonscopy    . DIAGNOSTIC LAPAROSCOPY  1976   fibroid removed  . ELBOW ARTHROSCOPY  2010, 2012  . EYE SURGERY Bilateral    cataract  . FOOT SURGERY Left 2013  . GASTROCNEMIUS RECESSION Right 10/09/2019   Procedure: RIGHT GASTROCNEMIUS RECESSION;  Surgeon: Newt Minion, MD;  Location: [facility];  Service: Orthopedics;  Laterality: Right;  . KNEE ARTHROSCOPY  Oct 2014  . KNEE ARTHROSCOPY W/ MENISCECTOMY Right 01/31/2013   [facility]  . LATERAL EPICONDYLE RELEASE  03/01/2011   Procedure: TENNIS ELBOW RELEASE;  Surgeon: Lorn Junes, MD;  Location: [facility];  Service: Orthopedics;  Laterality: Left;  TENOTOMY ELBOW LATERAL EPICONDYLITIS TENNIS ELBOW  . lateral release right elbow  2010  . left foot surgery  2013   mortons neuroma  . LEFT HEART CATHETERIZATION WITH CORONARY ANGIOGRAM N/A 07/23/2013   Procedure: LEFT HEART CATHETERIZATION WITH CORONARY ANGIOGRAM;  Surgeon: Sinclair Grooms, MD;  Location[facility] CATH LAB;  Service: Cardiovascular;  Laterality: N/A;  . PAROTID GLAND TUMOR EXCISION  1976   benign  . PAROTID  GLAND TUMOR EXCISION  1976   rt  . SHOULDER ARTHROSCOPY DISTAL CLAVICLE EXCISION AND OPEN ROTATOR CUFF REPAIR  2006  . SHOULDER ARTHROSCOPY W/ ROTATOR CUFF REPAIR  2006  . TOTAL KNEE ARTHROPLASTY Right 02/18/2014   Procedure: TOTAL KNEE ARTHROPLASTY;  Surgeon: Lorn Junes, MD;  Location: [facility];  Service: Orthopedics;  Laterality: Right;  . WEIL OSTEOTOMY  Right 10/09/2019   Procedure: WEIL OSTEOTOMY RIGHT 2ND METATARSAL;  Surgeon: Newt Minion, MD;  Location: [facility];  Service: Orthopedics;  Laterality: Right;   Social History   Occupational History  . Not on file  Tobacco Use  . Smoking status: Never Smoker  . Smokeless tobacco: Never Used  Substance and Sexual Activity  . Alcohol use: Yes    Comment: social  . Drug use: No  . Sexual activity: Yes    Birth control/protection: Surgical

## 2019-10-29 ENCOUNTER — Other Ambulatory Visit: Payer: Self-pay | Admitting: Family Medicine

## 2019-10-29 DIAGNOSIS — G609 Hereditary and idiopathic neuropathy, unspecified: Secondary | ICD-10-CM

## 2019-11-04 ENCOUNTER — Other Ambulatory Visit: Payer: Self-pay | Admitting: Family Medicine

## 2019-11-04 DIAGNOSIS — G4700 Insomnia, unspecified: Secondary | ICD-10-CM

## 2019-11-22 ENCOUNTER — Ambulatory Visit (INDEPENDENT_AMBULATORY_CARE_PROVIDER_SITE_OTHER): Payer: Medicare Other | Admitting: Physician Assistant

## 2019-11-22 ENCOUNTER — Encounter: Payer: Self-pay | Admitting: Physician Assistant

## 2019-11-22 VITALS — Ht 65.0 in | Wt 161.0 lb

## 2019-11-22 DIAGNOSIS — M205X1 Other deformities of toe(s) (acquired), right foot: Secondary | ICD-10-CM

## 2019-11-22 NOTE — Progress Notes (Signed)
Office Visit Note   Patient: Monica Kane           Date of Birth: 1945/08/22           MRN: [id number] Visit Date: 11/22/2019              Requested by: Darreld Mclean, MD [address] [place],  [place] [address] PCP: Darreld Mclean, MD  Chief Complaint  Patient presents with  . Right Foot - Routine Post Op    10/09/19 right Weil osteotomy 2nd toe , gastroc recession       HPI: This is a pleasant woman who is status post right Weil osteotomy 6 weeks ago.  She also had a gastroc recession.  She is doing great.  The only complaint is that 3 weeks ago she stepped down the stairs with her right leg and felt immediately tearing and pain in her calf.  It did get more swollen.  She said it is getting better over the course of the last few weeks and is just a little tender and swollen  Assessment & Plan: Visit Diagnoses: No diagnosis found.  Plan: We will continue returning to activities as tolerated.  If she has any increasing swelling or pain again in her calf she will contact us otherwise we will see her final time in 6 weeks  Follow-Up Instructions: No follow-ups on file.   Ortho Exam  Patient is alert, oriented, no adenopathy, well-dressed, normal affect, normal respiratory effort. Focused examination demonstrates well-healed surgical incisions excellent alignment of the toe.  She has mild soft tissue swelling in her calf minimally tender to palpation she does not have any pain with passive stretch.  Compartments are soft and compressible no evidence of cellulitis or infection  Imaging: No results found. No images are attached to the encounter.  Labs: Lab Results  Component Value Date   HGBA1C 6.3 07/26/2019   HGBA1C 6.3 10/31/2017   HGBA1C 5.7 (H) 08/12/2016   REPTSTATUS 01/18/2014 FINAL 01/17/2014   GRAMSTAIN No WBC Seen 01/02/2014   GRAMSTAIN Rare Squamous Epithelial Cells Present 01/02/2014   GRAMSTAIN No Organisms Seen 01/02/2014   CULT   01/17/2014    INSIGNIFICANT GROWTH Performed at [facility] 09/10/2015     Lab Results  Component Value Date   ALBUMIN 4.4 07/26/2019   ALBUMIN 4.0 09/25/2018   ALBUMIN 4.1 10/31/2017    No results found for: MG Lab Results  Component Value Date   VD25OH 15.42 (L) 07/26/2019   VD25OH 38.29 09/10/2015   VD25OH 16 (L) 06/03/2015    No results found for: PREALBUMIN CBC EXTENDED Latest Ref Rng & Units 07/26/2019 09/25/2018 09/25/2018  WBC 4.0 - 10.5 K/uL 5.3 - 5.3  RBC 3.87 - 5.11 Mil/uL 5.23(H) - 4.90  HGB 12.0 - 15.0 g/dL 13.8 13.6 12.9  HCT 36 - 46 % 42.7 40.0 40.6  PLT 150 - 400 K/uL 219.0 - 178  NEUTROABS 1.7 - 7.7 K/uL - - 3.3  LYMPHSABS 0.7 - 4.0 K/uL - - 1.5     Body mass index is 26.79 kg/m.  Orders:  No orders of the defined types were placed in this encounter.  No orders of the defined types were placed in this encounter.    Procedures: No procedures performed  Clinical Data: No additional findings.  ROS:  All other systems negative, except as noted in the HPI. Review of Systems  Objective: Vital Signs:  Ht 5\' 5"  (1.651 m)   Wt 161 lb (73 kg)   BMI 26.79 kg/m   Specialty Comments:  No specialty comments available.  PMFS History: Patient Active Problem List   Diagnosis Date Noted  . Acquired claw toe, right   . Achilles tendon contracture, right   . OSA (obstructive sleep apnea) 07/26/2019  . Vertigo 10/03/2018  . Visual blurriness 10/03/2018  . Pre-diabetes 06/05/2015  . Primary localized osteoarthritis of right knee   . Anxiety   . Chest pain, midsternal 07/23/2013  . Osteopenia 03/23/2013  . Kidney stones, calcium oxalate 03/23/2013  . Hypertension 03/31/2011  . Hyperlipidemia 03/31/2011  . Vitamin D deficiency 03/31/2011  . Hallux rigidus 03/24/2011  . Metatarsalgia of left foot 03/24/2011  . Lateral epicondylitis of left elbow 02/24/2011  . Migraine 02/24/2011  . Depression 02/24/2011  .  Arthritis 02/24/2011   Past Medical History:  Diagnosis Date  . Anxiety    takes Ativan nightly  . Depression    takes Zoloft daily  . History of colon polyps   . History of kidney stones   . History of staph infection 11/2013  . Hyperlipidemia    takes Crestor nightly   . Hypertension    takes Lisinopril and Coreg daily  . Joint pain   . Joint swelling   . Neuropathy    takes Elavil nightly  . Pneumonia   . Pre-diabetes   . Primary localized osteoarthritis of right knee   . Sleep apnea    uses oral appliance    Family History  Problem Relation Age of Onset  . Cancer Mother        lung  . Cancer Father        lung; + tobacco  . Heart disease Maternal Grandfather        heart attack  . Heart disease Paternal Grandfather        heart attack  . Diabetes Sister   . Hypertension Sister   . Hyperlipidemia Sister   . Breast cancer Neg Hx     Past Surgical History:  Procedure Laterality Date  . ABDOMINAL HYSTERECTOMY  1989  . APPENDECTOMY    . CARDIAC CATHETERIZATION  2015  . CERVICAL SPINE SURGERY  2004   C 5-6  . COLONOSCOPY    . colonscopy    . DIAGNOSTIC LAPAROSCOPY  1976   fibroid removed  . ELBOW ARTHROSCOPY  2010, 2012  . EYE SURGERY Bilateral    cataract  . FOOT SURGERY Left 2013  . GASTROCNEMIUS RECESSION Right 10/09/2019   Procedure: RIGHT GASTROCNEMIUS RECESSION;  Surgeon: Newt Minion, MD;  Location: [facility];  Service: Orthopedics;  Laterality: Right;  . KNEE ARTHROSCOPY  Oct 2014  . KNEE ARTHROSCOPY W/ MENISCECTOMY Right 01/31/2013   [facility]  . LATERAL EPICONDYLE RELEASE  03/01/2011   Procedure: TENNIS ELBOW RELEASE;  Surgeon: Lorn Junes, MD;  Location: [facility];  Service: Orthopedics;  Laterality: Left;  TENOTOMY ELBOW LATERAL EPICONDYLITIS TENNIS ELBOW  . lateral release right elbow  2010  . left foot surgery  2013   mortons neuroma  . LEFT HEART CATHETERIZATION WITH CORONARY ANGIOGRAM N/A 07/23/2013    Procedure: LEFT HEART CATHETERIZATION WITH CORONARY ANGIOGRAM;  Surgeon: Sinclair Grooms, MD;  [facility] C[facility] LAB;  Service: Cardiovascular;  Laterality: N/A;  . PAROTID GLAND TUMOR EXCISION  1976   benign  . PAROTID GLAND TUMOR EXCISION  1976   rt  . SHOULDER  ARTHROSCOPY DISTAL CLAVICLE EXCISION AND OPEN ROTATOR CUFF REPAIR  2006  . SHOULDER ARTHROSCOPY W/ ROTATOR CUFF REPAIR  2006  . TOTAL KNEE ARTHROPLASTY Right 02/18/2014   Procedure: TOTAL KNEE ARTHROPLASTY;  Surgeon: Lorn Junes, MD;  Location: [facility];  Service: Orthopedics;  Laterality: Right;  . WEIL OSTEOTOMY Right 10/09/2019   Procedure: WEIL OSTEOTOMY RIGHT 2ND METATARSAL;  Surgeon: Newt Minion, MD;  Location: [facility];  Service: Orthopedics;  Laterality: Right;   Social History   Occupational History  . Not on file  Tobacco Use  . Smoking status: Never Smoker  . Smokeless tobacco: Never Used  Substance and Sexual Activity  . Alcohol use: Yes    Comment: social  . Drug use: No  . Sexual activity: Yes    Birth control/protection: Surgical

## 2019-12-06 ENCOUNTER — Other Ambulatory Visit: Payer: Self-pay | Admitting: Family Medicine

## 2019-12-06 DIAGNOSIS — E785 Hyperlipidemia, unspecified: Secondary | ICD-10-CM

## 2020-01-01 ENCOUNTER — Other Ambulatory Visit: Payer: Self-pay | Admitting: Family Medicine

## 2020-01-01 DIAGNOSIS — I10 Essential (primary) hypertension: Secondary | ICD-10-CM

## 2020-01-03 ENCOUNTER — Ambulatory Visit: Payer: Medicare Other | Admitting: Physician Assistant

## 2020-01-15 DIAGNOSIS — Z23 Encounter for immunization: Secondary | ICD-10-CM | POA: Diagnosis not present

## 2020-01-28 ENCOUNTER — Encounter: Payer: Self-pay | Admitting: Physician Assistant

## 2020-01-28 ENCOUNTER — Ambulatory Visit (INDEPENDENT_AMBULATORY_CARE_PROVIDER_SITE_OTHER): Payer: Medicare Other | Admitting: Orthopedic Surgery

## 2020-01-28 VITALS — Ht 65.0 in | Wt 155.0 lb

## 2020-01-28 DIAGNOSIS — M205X1 Other deformities of toe(s) (acquired), right foot: Secondary | ICD-10-CM

## 2020-01-28 DIAGNOSIS — M6701 Short Achilles tendon (acquired), right ankle: Secondary | ICD-10-CM

## 2020-01-28 DIAGNOSIS — I872 Venous insufficiency (chronic) (peripheral): Secondary | ICD-10-CM

## 2020-01-28 NOTE — Progress Notes (Signed)
Office Visit Note   Patient: Monica Kane           Date of Birth: July 25, 1945           MRN: [id number] Visit Date: 01/28/2020              Requested by: Darreld Mclean, MD [address] [place],  [place] [address] PCP: Darreld Mclean, MD  Chief Complaint  Patient presents with  . Right Foot - Follow-up    10/09/2019 Right Weil Osteotomy nd toe, gastroc recession      HPI: Patient is a 74 year old woman who feels like she has had a excellent result from her gastrocnemius recession and Weil osteotomy of the second toe right lower extremity.  Patient denies any problems.  She states that she has started developing burning pain in both legs at the end of the day and states she has spider veins bilaterally.  Assessment & Plan: Visit Diagnoses:  1. Acquired claw toe, right   2. Achilles tendon contracture, right   3. Venous insufficiency (chronic) (peripheral)     Plan: Recommended knee-high compression stockings size large.  Follow-Up Instructions: Return if symptoms worsen or fail to improve.   Ortho Exam  Patient is alert, oriented, no adenopathy, well-dressed, normal affect, normal respiratory effort. Examination the second toe is straight the clawing has resolved she has excellent dorsiflexion of the ankle 20 degrees past neutral with her knee extended.  Patient does have spider veins on both legs with pitting edema both lower extremities as well as brawny skin color changes.  Her calf measures 38 cm in circumference.  Imaging: No results found. No images are attached to the encounter.  Labs: Lab Results  Component Value Date   HGBA1C 6.3 07/26/2019   HGBA1C 6.3 10/31/2017   HGBA1C 5.7 (H) 08/12/2016   REPTSTATUS 01/18/2014 FINAL 01/17/2014   GRAMSTAIN No WBC Seen 01/02/2014   GRAMSTAIN Rare Squamous Epithelial Cells Present 01/02/2014   GRAMSTAIN No Organisms Seen 01/02/2014   CULT  01/17/2014    INSIGNIFICANT GROWTH Performed at [facility] 09/10/2015     Lab Results  Component Value Date   ALBUMIN 4.4 07/26/2019   ALBUMIN 4.0 09/25/2018   ALBUMIN 4.1 10/31/2017    No results found for: MG Lab Results  Component Value Date   VD25OH 15.42 (L) 07/26/2019   VD25OH 38.29 09/10/2015   VD25OH 16 (L) 06/03/2015    No results found for: PREALBUMIN CBC EXTENDED Latest Ref Rng & Units 07/26/2019 09/25/2018 09/25/2018  WBC 4.0 - 10.5 K/uL 5.3 - 5.3  RBC 3.87 - 5.11 Mil/uL 5.23(H) - 4.90  HGB 12.0 - 15.0 g/dL 13.8 13.6 12.9  HCT 36 - 46 % 42.7 40.0 40.6  PLT 150 - 400 K/uL 219.0 - 178  NEUTROABS 1.7 - 7.7 K/uL - - 3.3  LYMPHSABS 0.7 - 4.0 K/uL - - 1.5     Body mass index is 25.79 kg/m.  Orders:  No orders of the defined types were placed in this encounter.  No orders of the defined types were placed in this encounter.    Procedures: No procedures performed  Clinical Data: No additional findings.  ROS:  All other systems negative, except as noted in the HPI. Review of Systems  Objective: Vital Signs: Ht 5\' 5"  (1.651 m)   Wt 155 lb (70.3 kg)   BMI 25.79 kg/m   Specialty Comments:  No specialty  comments available.  PMFS History: Patient Active Problem List   Diagnosis Date Noted  . Acquired claw toe, right   . Achilles tendon contracture, right   . OSA (obstructive sleep apnea) 07/26/2019  . Vertigo 10/03/2018  . Visual blurriness 10/03/2018  . Pre-diabetes 06/05/2015  . Primary localized osteoarthritis of right knee   . Anxiety   . Chest pain, midsternal 07/23/2013  . Osteopenia 03/23/2013  . Kidney stones, calcium oxalate 03/23/2013  . Hypertension 03/31/2011  . Hyperlipidemia 03/31/2011  . Vitamin D deficiency 03/31/2011  . Hallux rigidus 03/24/2011  . Metatarsalgia of left foot 03/24/2011  . Lateral epicondylitis of left elbow 02/24/2011  . Migraine 02/24/2011  . Depression 02/24/2011  . Arthritis 02/24/2011   Past Medical History:    Diagnosis Date  . Anxiety    takes Ativan nightly  . Depression    takes Zoloft daily  . History of colon polyps   . History of kidney stones   . History of staph infection 11/2013  . Hyperlipidemia    takes Crestor nightly   . Hypertension    takes Lisinopril and Coreg daily  . Joint pain   . Joint swelling   . Neuropathy    takes Elavil nightly  . Pneumonia   . Pre-diabetes   . Primary localized osteoarthritis of right knee   . Sleep apnea    uses oral appliance    Family History  Problem Relation Age of Onset  . Cancer Mother        lung  . Cancer Father        lung; + tobacco  . Heart disease Maternal Grandfather        heart attack  . Heart disease Paternal Grandfather        heart attack  . Diabetes Sister   . Hypertension Sister   . Hyperlipidemia Sister   . Breast cancer Neg Hx     Past Surgical History:  Procedure Laterality Date  . ABDOMINAL HYSTERECTOMY  1989  . APPENDECTOMY    . CARDIAC CATHETERIZATION  2015  . CERVICAL SPINE SURGERY  2004   C 5-6  . COLONOSCOPY    . colonscopy    . DIAGNOSTIC LAPAROSCOPY  1976   fibroid removed  . ELBOW ARTHROSCOPY  2010, 2012  . EYE SURGERY Bilateral    cataract  . FOOT SURGERY Left 2013  . GASTROCNEMIUS RECESSION Right 10/09/2019   Procedure: RIGHT GASTROCNEMIUS RECESSION;  Surgeon: Newt Minion, MD;  Location: [facility];  Service: Orthopedics;  Laterality: Right;  . KNEE ARTHROSCOPY  Oct 2014  . KNEE ARTHROSCOPY W/ MENISCECTOMY Right 01/31/2013   [facility]  . LATERAL EPICONDYLE RELEASE  03/01/2011   Procedure: TENNIS ELBOW RELEASE;  Surgeon: Lorn Junes, MD;  Location: [facility];  Service: Orthopedics;  Laterality: Left;  TENOTOMY ELBOW LATERAL EPICONDYLITIS TENNIS ELBOW  . lateral release right elbow  2010  . left foot surgery  2013   mortons neuroma  . LEFT HEART CATHETERIZATION WITH CORONARY ANGIOGRAM N/A 07/23/2013   Procedure: LEFT HEART CATHETERIZATION WITH CORONARY  ANGIOGRAM;  Surgeon: Sinclair Grooms, MD;  Location: [facility] CATH LAB;  Service: Cardiovascular;  Laterality: N/A;  . PAROTID GLAND TUMOR EXCISION  1976   benign  . PAROTID GLAND TUMOR EXCISION  1976   rt  . SHOULDER ARTHROSCOPY DISTAL CLAVICLE EXCISION AND OPEN ROTATOR CUFF REPAIR  2006  . SHOULDER ARTHROSCOPY W/ ROTATOR CUFF REPAIR  2006  .  TOTAL KNEE ARTHROPLASTY Right 02/18/2014   Procedure: TOTAL KNEE ARTHROPLASTY;  Surgeon: Lorn Junes, MD;  Location: [facility];  Service: Orthopedics;  Laterality: Right;  . WEIL OSTEOTOMY Right 10/09/2019   Procedure: WEIL OSTEOTOMY RIGHT 2ND METATARSAL;  Surgeon: Newt Minion, MD;  Location: [facility];  Service: Orthopedics;  Laterality: Right;   Social History   Occupational History  . Not on file  Tobacco Use  . Smoking status: Never Smoker  . Smokeless tobacco: Never Used  Substance and Sexual Activity  . Alcohol use: Yes    Comment: social  . Drug use: No  . Sexual activity: Yes    Birth control/protection: Surgical

## 2020-02-11 ENCOUNTER — Other Ambulatory Visit: Payer: Medicare Other

## 2020-02-18 ENCOUNTER — Other Ambulatory Visit: Payer: Self-pay | Admitting: Family Medicine

## 2020-02-18 DIAGNOSIS — F321 Major depressive disorder, single episode, moderate: Secondary | ICD-10-CM

## 2020-02-18 DIAGNOSIS — E785 Hyperlipidemia, unspecified: Secondary | ICD-10-CM

## 2020-02-20 ENCOUNTER — Other Ambulatory Visit: Payer: Self-pay | Admitting: Family Medicine

## 2020-02-20 DIAGNOSIS — E785 Hyperlipidemia, unspecified: Secondary | ICD-10-CM

## 2020-02-21 ENCOUNTER — Other Ambulatory Visit: Payer: Self-pay | Admitting: Family Medicine

## 2020-02-21 DIAGNOSIS — F321 Major depressive disorder, single episode, moderate: Secondary | ICD-10-CM

## 2020-02-23 NOTE — Patient Instructions (Addendum)
It was great to see you again today, I will be in touch with your labs soon as possible Have a wonderful time taking care of the new baby in [place]  Please call and schedule a GI appointment for after you return from your trip  Also, please plan to get a shingles vaccine series at your pharmacy at your convenience  Please see me in about 6 months, take care

## 2020-02-23 NOTE — Progress Notes (Addendum)
[place] [facility] [address], [place], [place] [address] [phone]  Date:  02/25/2020   Name:  Monica Kane   DOB:  1945/12/28   MRN:  [id number]  PCP:  Darreld Mclean, MD    Chief Complaint: Medication Refill   History of Present Illness:  Monica Kane is a 74 y.o. very pleasant female patient who presents with the following:  Patient here today to follow-up on medications- history of hypertension, prediabetes hyperlipidemia, sleep apnea treated with oral appliance, vitamin D deficiency Last seen by myself in April of this year She is on sertraline 250- has been on this dose since the 90s. She does see a psychiatrist, but I prescribed her sertraline  She will be traveling to [place] coming up soon- she will be there for 2 months Her niece is having a new baby who may have some congenital issues.  Aslee is going there to help  Colon cancer screening- she is on 5 year plan and will schedule soon  Flu vaccine- done COVID-19 booster- done  Shingrix- this needs to be done at her pharmacy Mammogram up-to-date DEXA scan up-to-date Most recent labs on chart from April of this year Patient Active Problem List   Diagnosis Date Noted  . Acquired claw toe, right   . Achilles tendon contracture, right   . OSA (obstructive sleep apnea) 07/26/2019  . Vertigo 10/03/2018  . Visual blurriness 10/03/2018  . Pre-diabetes 06/05/2015  . Primary localized osteoarthritis of right knee   . Anxiety   . Chest pain, midsternal 07/23/2013  . Osteopenia 03/23/2013  . Kidney stones, calcium oxalate 03/23/2013  . Hypertension 03/31/2011  . Hyperlipidemia 03/31/2011  . Vitamin D deficiency 03/31/2011  . Hallux rigidus 03/24/2011  . Metatarsalgia of left foot 03/24/2011  . Lateral epicondylitis of left elbow 02/24/2011  . Migraine 02/24/2011  . Depression 02/24/2011  . Arthritis 02/24/2011    Past Medical History:   Diagnosis Date  . Anxiety    takes Ativan nightly  . Depression    takes Zoloft daily  . History of colon polyps   . History of kidney stones   . History of staph infection 11/2013  . Hyperlipidemia    takes Crestor nightly   . Hypertension    takes Lisinopril and Coreg daily  . Joint pain   . Joint swelling   . Neuropathy    takes Elavil nightly  . Pneumonia   . Pre-diabetes   . Primary localized osteoarthritis of right knee   . Sleep apnea    uses oral appliance    Past Surgical History:  Procedure Laterality Date  . ABDOMINAL HYSTERECTOMY  1989  . APPENDECTOMY    . CARDIAC CATHETERIZATION  2015  . CERVICAL SPINE SURGERY  2004   C 5-6  . COLONOSCOPY    . colonscopy    . DIAGNOSTIC LAPAROSCOPY  1976   fibroid removed  . ELBOW ARTHROSCOPY  2010, 2012  . EYE SURGERY Bilateral    cataract  . FOOT SURGERY Left 2013  . GASTROCNEMIUS RECESSION Right 10/09/2019   Procedure: RIGHT GASTROCNEMIUS RECESSION;  Surgeon: Newt Minion, MD;  Location: [facility];  Service: Orthopedics;  Laterality: Right;  . KNEE ARTHROSCOPY  Oct 2014  . KNEE ARTHROSCOPY W/ MENISCECTOMY Right 01/31/2013   [facility]  . LATERAL EPICONDYLE RELEASE  03/01/2011   Procedure: TENNIS ELBOW RELEASE;  Surgeon: Lorn Junes, MD;  Location: [facility];  Service: Orthopedics;  Laterality: Left;  TENOTOMY ELBOW LATERAL EPICONDYLITIS TENNIS ELBOW  . lateral release right elbow  2010  . left foot surgery  2013   mortons neuroma  . LEFT HEART CATHETERIZATION WITH CORONARY ANGIOGRAM N/A 07/23/2013   Procedure: LEFT HEART CATHETERIZATION WITH CORONARY ANGIOGRAM;  Surgeon: Sinclair Grooms, MD;  Location: [facility] CATH LAB;  Service: Cardiovascular;  Laterality: N/A;  . PAROTID GLAND TUMOR EXCISION  1976   benign  . PAROTID GLAND TUMOR EXCISION  1976   rt  . SHOULDER ARTHROSCOPY DISTAL CLAVICLE EXCISION AND OPEN ROTATOR CUFF REPAIR  2006  . SHOULDER ARTHROSCOPY W/ ROTATOR CUFF REPAIR   2006  . TOTAL KNEE ARTHROPLASTY Right 02/18/2014   Procedure: TOTAL KNEE ARTHROPLASTY;  Surgeon: Lorn Junes, MD;  Location: [facility];  Service: Orthopedics;  Laterality: Right;  . WEIL OSTEOTOMY Right 10/09/2019   Procedure: WEIL OSTEOTOMY RIGHT 2ND METATARSAL;  Surgeon: Newt Minion, MD;  Location: [facility];  Service: Orthopedics;  Laterality: Right;    Social History   Tobacco Use  . Smoking status: Never Smoker  . Smokeless tobacco: Never Used  Substance Use Topics  . Alcohol use: Yes    Comment: social  . Drug use: No    Family History  Problem Relation Age of Onset  . Cancer Mother        lung  . Cancer Father        lung; + tobacco  . Heart disease Maternal Grandfather        heart attack  . Heart disease Paternal Grandfather        heart attack  . Diabetes Sister   . Hypertension Sister   . Hyperlipidemia Sister   . Breast cancer Neg Hx     Allergies  Allergen Reactions  . Penicillins Swelling and Rash    Medication list has been reviewed and updated.  Current Outpatient Medications on File Prior to Visit  Medication Sig Dispense Refill  . amitriptyline (ELAVIL) 25 MG tablet TAKE ONE TABLET AT BEDTIME. 90 tablet 2  . carvedilol (COREG) 3.125 MG tablet Take 3.125 mg by mouth at bedtime.    Marland Kitchen lisinopril (ZESTRIL) 20 MG tablet TAKE 1 TABLET BY MOUTH DAILY. (Patient taking differently: Take 20 mg by mouth daily. In morning) 90 tablet 0  . rosuvastatin (CRESTOR) 10 MG tablet TAKE 1 TABLET ONCE DAILY. 90 tablet 0  . sertraline (ZOLOFT) 100 MG tablet Take 2.5 tablets (250 mg total) by mouth daily. 75 tablet 0  . traZODone (DESYREL) 50 MG tablet TAKE 1 OR 2 TABLETS AT BEDTIME. 180 tablet 3   No current facility-administered medications on file prior to visit.    Review of Systems:  As per HPI- otherwise negative.   Physical Examination: Vitals:   02/25/20 0936  BP: 132/78  Pulse: 70  Resp: 16  SpO2: 97%   Vitals:   02/25/20 0936   Weight: 165 lb (74.8 kg)  Height: 5\' 5"  (1.651 m)   Body mass index is 27.46 kg/m. Ideal Body Weight: Weight in (lb) to have BMI = 25: 149.9  GEN: no acute distress.  Mild overweight, looks well HEENT: Atraumatic, Normocephalic.  Ears and Nose: No external deformity. CV: RRR, No M/G/R. No JVD. No thrill. No extra heart sounds. PULM: CTA B, no wheezes, crackles, rhonchi. No retractions. No resp. distress. No accessory muscle use. EXTR: No c/c/e PSYCH: Normally interactive. Conversant.    Assessment  and Plan: Essential hypertension - Plan: CBC, Comprehensive metabolic panel  Mixed hyperlipidemia  Pre-diabetes - Plan: Hemoglobin A1c  Vitamin D deficiency - Plan: VITAMIN D 25 Hydroxy (Vit-D Deficiency, Fractures)  Moderate single current episode of major depressive disorder (HCC) - Plan: sertraline (ZOLOFT) 100 MG tablet   Patient seen today for follow-up visit Labs are pending as above Refilled sertraline at usual dose Will plan further follow- up pending labs.  Encouraged her to schedule colon cancer screening and also shingles vaccine This visit occurred during the SARS-CoV-2 public health emergency.  Safety protocols were in place, including screening questions prior to the visit, additional usage of staff PPE, and extensive cleaning of exam room while observing appropriate contact time as indicated for disinfecting solutions.    Signed Lamar Blinks, MD  Received labs as below, message to pt  Results for orders placed or performed in visit on 02/25/20  CBC  Result Value Ref Range   WBC 5.1 4.0 - 10.5 K/uL   RBC 5.20 (H) 3.87 - 5.11 Mil/uL   Platelets 192.0 150 - 400 K/uL   Hemoglobin 13.5 12.0 - 15.0 g/dL   HCT 41.5 36 - 46 %   MCV 79.9 78.0 - 100.0 fl   MCHC 32.5 30.0 - 36.0 g/dL   RDW 13.5 11.5 - 15.5 %  Comprehensive metabolic panel  Result Value Ref Range   Sodium 140 135 - 145 mEq/L   Potassium 4.3 3.5 - 5.1 mEq/L   Chloride 105 96 - 112 mEq/L   CO2  27 19 - 32 mEq/L   Glucose, Bld 125 (H) 70 - 99 mg/dL   BUN 17 6 - 23 mg/dL   Creatinine, Ser 0.92 0.40 - 1.20 mg/dL   Total Bilirubin 0.6 0.2 - 1.2 mg/dL   Alkaline Phosphatase 80 39 - 117 U/L   AST 24 0 - 37 U/L   ALT 18 0 - 35 U/L   Total Protein 6.3 6.0 - 8.3 g/dL   Albumin 4.4 3.5 - 5.2 g/dL   GFR 62.31 >60.00 mL/min   Calcium 9.7 8.4 - 10.5 mg/dL  VITAMIN D 25 Hydroxy (Vit-D Deficiency, Fractures)  Result Value Ref Range   VITD 28.92 (L) 30.00 - 100.00 ng/mL  Hemoglobin A1c  Result Value Ref Range   Hgb A1c MFr Bld 6.8 (H) 4.6 - 6.5 %

## 2020-02-25 ENCOUNTER — Encounter: Payer: Self-pay | Admitting: Family Medicine

## 2020-02-25 ENCOUNTER — Ambulatory Visit (INDEPENDENT_AMBULATORY_CARE_PROVIDER_SITE_OTHER): Payer: Medicare Other | Admitting: Family Medicine

## 2020-02-25 ENCOUNTER — Other Ambulatory Visit: Payer: Self-pay

## 2020-02-25 VITALS — BP 132/78 | HR 70 | Resp 16 | Ht 65.0 in | Wt 165.0 lb

## 2020-02-25 DIAGNOSIS — I10 Essential (primary) hypertension: Secondary | ICD-10-CM

## 2020-02-25 DIAGNOSIS — E559 Vitamin D deficiency, unspecified: Secondary | ICD-10-CM | POA: Diagnosis not present

## 2020-02-25 DIAGNOSIS — E782 Mixed hyperlipidemia: Secondary | ICD-10-CM | POA: Diagnosis not present

## 2020-02-25 DIAGNOSIS — F321 Major depressive disorder, single episode, moderate: Secondary | ICD-10-CM

## 2020-02-25 DIAGNOSIS — R7303 Prediabetes: Secondary | ICD-10-CM | POA: Diagnosis not present

## 2020-02-25 LAB — COMPREHENSIVE METABOLIC PANEL
ALT: 18 U/L (ref 0–35)
AST: 24 U/L (ref 0–37)
Albumin: 4.4 g/dL (ref 3.5–5.2)
Alkaline Phosphatase: 80 U/L (ref 39–117)
BUN: 17 mg/dL (ref 6–23)
CO2: 27 mEq/L (ref 19–32)
Calcium: 9.7 mg/dL (ref 8.4–10.5)
Chloride: 105 mEq/L (ref 96–112)
Creatinine, Ser: 0.92 mg/dL (ref 0.40–1.20)
GFR: 62.31 mL/min (ref >60.00)
Glucose, Bld: 125 mg/dL — ABNORMAL HIGH (ref 70–99)
Potassium: 4.3 mEq/L (ref 3.5–5.1)
Sodium: 140 mEq/L (ref 135–145)
Total Bilirubin: 0.6 mg/dL (ref 0.2–1.2)
Total Protein: 6.3 g/dL (ref 6.0–8.3)

## 2020-02-25 LAB — CBC
HCT: 41.5 % (ref 36.0–46.0)
Hemoglobin: 13.5 g/dL (ref 12.0–15.0)
MCHC: 32.5 g/dL (ref 30.0–36.0)
MCV: 79.9 fl (ref 78.0–100.0)
Platelets: 192 10*3/uL (ref 150.0–400.0)
RBC: 5.2 Mil/uL — ABNORMAL HIGH (ref 3.87–5.11)
RDW: 13.5 % (ref 11.5–15.5)
WBC: 5.1 10*3/uL (ref 4.0–10.5)

## 2020-02-25 LAB — HEMOGLOBIN A1C: Hgb A1c MFr Bld: 6.8 % — ABNORMAL HIGH (ref 4.6–6.5)

## 2020-02-25 LAB — VITAMIN D 25 HYDROXY (VIT D DEFICIENCY, FRACTURES): VITD: 28.92 ng/mL — ABNORMAL LOW (ref 30.00–100.00)

## 2020-02-25 MED ORDER — SERTRALINE HCL 100 MG PO TABS: 250.0000 mg | ORAL_TABLET | Freq: Every day | ORAL | 3 refills | Status: DC

## 2020-02-29 DIAGNOSIS — H04223 Epiphora due to insufficient drainage, bilateral lacrimal glands: Secondary | ICD-10-CM | POA: Diagnosis not present

## 2020-02-29 DIAGNOSIS — Z961 Presence of intraocular lens: Secondary | ICD-10-CM | POA: Diagnosis not present

## 2020-02-29 DIAGNOSIS — H04563 Stenosis of bilateral lacrimal punctum: Secondary | ICD-10-CM | POA: Diagnosis not present

## 2020-02-29 DIAGNOSIS — H5213 Myopia, bilateral: Secondary | ICD-10-CM | POA: Diagnosis not present

## 2020-03-02 DIAGNOSIS — Z20822 Contact with and (suspected) exposure to covid-19: Secondary | ICD-10-CM | POA: Diagnosis not present

## 2020-05-12 DIAGNOSIS — H04561 Stenosis of right lacrimal punctum: Secondary | ICD-10-CM | POA: Diagnosis not present

## 2020-05-12 DIAGNOSIS — H04562 Stenosis of left lacrimal punctum: Secondary | ICD-10-CM | POA: Diagnosis not present

## 2020-05-29 ENCOUNTER — Other Ambulatory Visit: Payer: Self-pay | Admitting: Family Medicine

## 2020-05-29 DIAGNOSIS — I10 Essential (primary) hypertension: Secondary | ICD-10-CM

## 2020-06-16 DIAGNOSIS — L821 Other seborrheic keratosis: Secondary | ICD-10-CM | POA: Diagnosis not present

## 2020-06-16 DIAGNOSIS — D225 Melanocytic nevi of trunk: Secondary | ICD-10-CM | POA: Diagnosis not present

## 2020-06-16 DIAGNOSIS — D224 Melanocytic nevi of scalp and neck: Secondary | ICD-10-CM | POA: Diagnosis not present

## 2020-06-16 DIAGNOSIS — D2230 Melanocytic nevi of unspecified part of face: Secondary | ICD-10-CM | POA: Diagnosis not present

## 2020-06-16 DIAGNOSIS — L820 Inflamed seborrheic keratosis: Secondary | ICD-10-CM | POA: Diagnosis not present

## 2020-06-25 ENCOUNTER — Other Ambulatory Visit: Payer: Self-pay | Admitting: Family Medicine

## 2020-06-25 DIAGNOSIS — E785 Hyperlipidemia, unspecified: Secondary | ICD-10-CM

## 2020-07-07 DIAGNOSIS — M25561 Pain in right knee: Secondary | ICD-10-CM | POA: Diagnosis not present

## 2020-07-15 DIAGNOSIS — G4730 Sleep apnea, unspecified: Secondary | ICD-10-CM | POA: Diagnosis not present

## 2020-07-15 DIAGNOSIS — Z791 Long term (current) use of non-steroidal anti-inflammatories (NSAID): Secondary | ICD-10-CM | POA: Diagnosis not present

## 2020-07-15 DIAGNOSIS — Z86010 Personal history of colonic polyps: Secondary | ICD-10-CM | POA: Diagnosis not present

## 2020-07-15 DIAGNOSIS — Z1211 Encounter for screening for malignant neoplasm of colon: Secondary | ICD-10-CM | POA: Diagnosis not present

## 2020-07-15 DIAGNOSIS — K635 Polyp of colon: Secondary | ICD-10-CM | POA: Diagnosis not present

## 2020-07-15 DIAGNOSIS — K641 Second degree hemorrhoids: Secondary | ICD-10-CM | POA: Diagnosis not present

## 2020-07-15 DIAGNOSIS — Z79899 Other long term (current) drug therapy: Secondary | ICD-10-CM | POA: Diagnosis not present

## 2020-07-15 DIAGNOSIS — D123 Benign neoplasm of transverse colon: Secondary | ICD-10-CM | POA: Diagnosis not present

## 2020-07-15 DIAGNOSIS — I10 Essential (primary) hypertension: Secondary | ICD-10-CM | POA: Diagnosis not present

## 2020-07-15 DIAGNOSIS — Z800 Family history of malignant neoplasm of digestive organs: Secondary | ICD-10-CM | POA: Diagnosis not present

## 2020-07-15 DIAGNOSIS — Z880 Allergy status to penicillin: Secondary | ICD-10-CM | POA: Diagnosis not present

## 2020-07-15 DIAGNOSIS — E785 Hyperlipidemia, unspecified: Secondary | ICD-10-CM | POA: Diagnosis not present

## 2020-07-15 DIAGNOSIS — K5730 Diverticulosis of large intestine without perforation or abscess without bleeding: Secondary | ICD-10-CM | POA: Diagnosis not present

## 2020-07-15 DIAGNOSIS — K644 Residual hemorrhoidal skin tags: Secondary | ICD-10-CM | POA: Diagnosis not present

## 2020-07-17 DIAGNOSIS — D123 Benign neoplasm of transverse colon: Secondary | ICD-10-CM | POA: Diagnosis not present

## 2020-08-04 ENCOUNTER — Other Ambulatory Visit: Payer: Self-pay | Admitting: Family Medicine

## 2020-08-04 DIAGNOSIS — G609 Hereditary and idiopathic neuropathy, unspecified: Secondary | ICD-10-CM

## 2020-08-19 NOTE — Progress Notes (Addendum)
[facility], [place], [place] [address] [phone]  Date:  08/21/2020   Name:  Monica Kane   DOB:  [birth year]   MRN:  [id number]  PCP:  Darreld Mclean, MD    Chief Complaint: Urinary Tract Infection (Thicker cloudy urine, with odor ) and Follow-up   History of Present Illness:  Monica Kane is a 75 y.o. very pleasant female patient who presents with the following:  Here today for blood sugar follow-up, and with concern of possible UTI Last seen by myself in November-  history of hypertension, prediabetes hyperlipidemia, sleep apnea treated with oral appliance, vitamin D deficiency Lab Results  Component Value Date   HGBA1C 6.7 (H) 08/21/2020   At her last visit she was planning an upcoming trip to [place] for about 2 months to help her niece with a new baby-there was some concern that baby might have special needs The baby did turn out to have some mild congenital anomalies, but is doing overall very well and the family is thriving.    Colon cancer screen UTD- colonoscopy per Dr Bonnita Nasuti 07/15/20.  5 year follow-up, removed one polyp  COVID-19 booster- done 01/15/20, discussed fourth dose Mammo-1 year ago, she will schedule  shingrix- we discussed today  Possible UTI sx for about 10 days  No fever or back pain, no hematuria She noted a urinary odor, urine seemed cloudy and thick She notes urinary frequency  She enjoys ballroom dancing and goes several times a week -this is her main exercise  She recently lost her sister to liver failure and his son, her nephew also passed away about 2 months ago (he had liver cancer)  She is seeing her counselor to help with grief -she feels like she is doing okay  Also, she has noted a cough for a few months- this has gotten better but not cleared up as of yet Never a smoker   Patient Active Problem List   Diagnosis Date Noted  . Acquired claw toe,  right   . Achilles tendon contracture, right   . OSA (obstructive sleep apnea) 07/26/2019  . Vertigo 10/03/2018  . Visual blurriness 10/03/2018  . Controlled type 2 diabetes mellitus without complication, without long-term current use of insulin ([facility]) 06/05/2015  . Primary localized osteoarthritis of right knee   . Anxiety   . Chest pain, midsternal 07/23/2013  . Osteopenia 03/23/2013  . Kidney stones, calcium oxalate 03/23/2013  . Hypertension 03/31/2011  . Hyperlipidemia 03/31/2011  . Vitamin D deficiency 03/31/2011  . Hallux rigidus 03/24/2011  . Metatarsalgia of left foot 03/24/2011  . Lateral epicondylitis of left elbow 02/24/2011  . Migraine 02/24/2011  . Depression 02/24/2011  . Arthritis 02/24/2011    Past Medical History:  Diagnosis Date  . Anxiety    takes Ativan nightly  . Depression    takes Zoloft daily  . History of colon polyps   . History of kidney stones   . History of staph infection 11/2013  . Hyperlipidemia    takes Crestor nightly   . Hypertension    takes Lisinopril and Coreg daily  . Joint pain   . Joint swelling   . Neuropathy    takes Elavil nightly  . Pneumonia   . Pre-diabetes   . Primary localized osteoarthritis of right knee   . Sleep apnea    uses oral appliance    Past Surgical History:  Procedure Laterality Date  . ABDOMINAL HYSTERECTOMY  1989  . APPENDECTOMY    . CARDIAC CATHETERIZATION  2015  . CERVICAL SPINE SURGERY  2004   C 5-6  . COLONOSCOPY    . colonscopy    . DIAGNOSTIC LAPAROSCOPY  1976   fibroid removed  . ELBOW ARTHROSCOPY  2010, 2012  . EYE SURGERY Bilateral    cataract  . FOOT SURGERY Left 2013  . GASTROCNEMIUS RECESSION Right 10/09/2019   Procedure: RIGHT GASTROCNEMIUS RECESSION;  Surgeon: Newt Minion, MD;  Location: [facility];  Service: Orthopedics;  Laterality: Right;  . KNEE ARTHROSCOPY  Oct 2014  . KNEE ARTHROSCOPY W/ MENISCECTOMY Right 01/31/2013   [facility]  . LATERAL EPICONDYLE  RELEASE  03/01/2011   Procedure: TENNIS ELBOW RELEASE;  Surgeon: Lorn Junes, MD;  Location: [facility];  Service: Orthopedics;  Laterality: Left;  TENOTOMY ELBOW LATERAL EPICONDYLITIS TENNIS ELBOW  . lateral release right elbow  2010  . left foot surgery  2013   mortons neuroma  . LEFT HEART CATHETERIZATION WITH CORONARY ANGIOGRAM N/A 07/23/2013   Procedure: LEFT HEART CATHETERIZATION WITH CORONARY ANGIOGRAM;  Surgeon: Sinclair Grooms, MD;  Location: [facility] CATH LAB;  Service: Cardiovascular;  Laterality: N/A;  . PAROTID GLAND TUMOR EXCISION  1976   benign  . PAROTID GLAND TUMOR EXCISION  1976   rt  . SHOULDER ARTHROSCOPY DISTAL CLAVICLE EXCISION AND OPEN ROTATOR CUFF REPAIR  2006  . SHOULDER ARTHROSCOPY W/ ROTATOR CUFF REPAIR  2006  . TOTAL KNEE ARTHROPLASTY Right 02/18/2014   Procedure: TOTAL KNEE ARTHROPLASTY;  Surgeon: Lorn Junes, MD;  Location: [facility];  Service: Orthopedics;  Laterality: Right;  . WEIL OSTEOTOMY Right 10/09/2019   Procedure: WEIL OSTEOTOMY RIGHT 2ND METATARSAL;  Surgeon: Newt Minion, MD;  Location: [facility];  Service: Orthopedics;  Laterality: Right;    Social History   Tobacco Use  . Smoking status: Never Smoker  . Smokeless tobacco: Never Used  Substance Use Topics  . Alcohol use: Yes    Comment: social  . Drug use: No    Family History  Problem Relation Age of Onset  . Cancer Mother        lung  . Cancer Father        lung; + tobacco  . Heart disease Maternal Grandfather        heart attack  . Heart disease Paternal Grandfather        heart attack  . Diabetes Sister   . Hypertension Sister   . Hyperlipidemia Sister   . Breast cancer Neg Hx     Allergies  Allergen Reactions  . Penicillins Swelling and Rash    Medication list has been reviewed and updated.  Current Outpatient Medications on File Prior to Visit  Medication Sig Dispense Refill  . amitriptyline (ELAVIL) 25 MG tablet Take 1 tablet (25 mg  total) by mouth at bedtime. 90 tablet 1  . carvedilol (COREG) 3.125 MG tablet Take 3.125 mg by mouth at bedtime.    Marland Kitchen lisinopril (ZESTRIL) 20 MG tablet Take 1 tablet (20 mg total) by mouth daily. 90 tablet 1  . rosuvastatin (CRESTOR) 10 MG tablet Take 1 tablet (10 mg total) by mouth daily. 90 tablet 1  . sertraline (ZOLOFT) 100 MG tablet Take 2.5 tablets (250 mg total) by mouth daily. 225 tablet 3  . traZODone (DESYREL) 50 MG tablet TAKE 1 OR 2 TABLETS AT BEDTIME. 180 tablet 3  No current facility-administered medications on file prior to visit.    Review of Systems:  As per HPI- otherwise negative.   Physical Examination: Vitals:   08/21/20 0850 08/21/20 0921  BP: (!) 178/82 (!) 150/80  Pulse: (!) 59   Temp: (!) 97.2 F (36.2 C)   SpO2: 98%    Vitals:   08/21/20 0850  Weight: 162 lb (73.5 kg)  Height: 5\' 5"  (1.651 m)   Body mass index is 26.96 kg/m. Ideal Body Weight: Weight in (lb) to have BMI = 25: 149.9  GEN: no acute distress.  Mild overweight, looks well HEENT: Atraumatic, Normocephalic.   Bilateral TM wnl, oropharynx normal.  PEERL,EOMI.   Ears and Nose: No external deformity. CV: RRR, No M/G/R. No JVD. No thrill. No extra heart sounds. PULM: CTA B, no wheezes, crackles, rhonchi. No retractions. No resp. distress. No accessory muscle use. ABD: S, NT, ND, +BS. No rebound. No HSM. EXTR: No c/c/e PSYCH: Normally interactive. Conversant.  No CVA tenderness, benign belly   BP Readings from Last 3 Encounters:  08/21/20 (!) 150/80  02/25/20 132/78  10/09/19 (!) 163/52    Assessment and Plan: Dysuria - Plan: Urine Culture, POCT Urinalysis Dipstick (Automated), sulfamethoxazole-trimethoprim (BACTRIM DS) 800-160 MG tablet, CANCELED: POCT urinalysis dipstick  Essential hypertension - Plan: Basic metabolic panel  Mixed hyperlipidemia - Plan: Lipid panel  Pre-diabetes - Plan: Hemoglobin A1c  Cough - Plan: DG Chest 2 View  Spider veins  Controlled type 2  diabetes mellitus without complication, without long-term current use of insulin ([facility])  Addressing a few concerns today.  Likely UTI, will treat with Septra pending culture  Blood pressure continues to be elevated on 20 mg of lisinopril.  I have asked her to take 30 mg, she is able to monitor her blood pressure at home and will update me with some readings in 1 to 2 weeks  She is interested in spider vein treatment-recommend vascular and vein specialists  Recommended Shingrix vaccine and fourth dose of COVID-19 She has also noted a mild cough for several weeks, will obtain a chest film today Will plan further follow- up pending labs.   This visit occurred during the SARS-CoV-2 public health emergency.  Safety protocols were in place, including screening questions prior to the visit, additional usage of staff PPE, and extensive cleaning of exam room while observing appropriate contact time as indicated for disinfecting solutions.    Signed Lamar Blinks, MD  Received her chest x-ray as follows, message to patient  No results found. Received labs as below, message to pt  Results for orders placed or performed in visit on 08/21/20  Urine Culture   Specimen: Urine  Result Value Ref Range   MICRO NUMBER: [id number]    SPECIMEN QUALITY: Adequate    Sample Source NOT GIVEN    STATUS: FINAL    ISOLATE 1: Escherichia coli (A)       Susceptibility   Escherichia coli - URINE CULTURE, REFLEX    AMOX/CLAVULANIC <=2 Sensitive     AMPICILLIN <=2 Sensitive     AMPICILLIN/SULBACTAM <=2 Sensitive     CEFAZOLIN* <=4 Not Reportable      * For infections other than uncomplicated UTIcaused by E. coli, K. pneumoniae or P. mirabilis:Cefazolin is resistant if MIC > or = 8 mcg/mL.(Distinguishing susceptible versus intermediatefor isolates with MIC < or = 4 mcg/mL requiresadditional testing.)For uncomplicated UTI caused by E. coli,K. pneumoniae or P. mirabilis: Cefazolin issusceptible if MIC <32 mcg/mL  and predictssusceptible to  the oral agents cefaclor, cefdinir,cefpodoxime, cefprozil, cefuroxime, cephalexinand loracarbef.    CEFEPIME <=1 Sensitive     CEFTRIAXONE <=1 Sensitive     CIPROFLOXACIN <=0.25 Sensitive     LEVOFLOXACIN <=0.12 Sensitive     ERTAPENEM <=0.5 Sensitive     GENTAMICIN <=1 Sensitive     IMIPENEM <=0.25 Sensitive     NITROFURANTOIN <=16 Sensitive     PIP/TAZO <=4 Sensitive     TOBRAMYCIN <=1 Sensitive     TRIMETH/SULFA* <=20 Sensitive      * For infections other than uncomplicated UTIcaused by E. coli, K. pneumoniae or P. mirabilis:Cefazolin is resistant if MIC > or = 8 mcg/mL.(Distinguishing susceptible versus intermediatefor isolates with MIC < or = 4 mcg/mL requiresadditional testing.)For uncomplicated UTI caused by E. coli,K. pneumoniae or P. mirabilis: Cefazolin issusceptible if MIC <32 mcg/mL and predictssusceptible to the oral agents cefaclor, cefdinir,cefpodoxime, cefprozil, cefuroxime, cephalexinand loracarbef.Legend:S = Susceptible  I = IntermediateR = Resistant  NS = Not susceptible* = Not tested  NR = Not reported**NN = See antimicrobic comments  Basic metabolic panel  Result Value Ref Range   Sodium 140 135 - 145 mEq/L   Potassium 4.1 3.5 - 5.1 mEq/L   Chloride 106 96 - 112 mEq/L   CO2 27 19 - 32 mEq/L   Glucose, Bld 136 (H) 70 - 99 mg/dL   BUN 20 6 - 23 mg/dL   Creatinine, Ser 0.87 0.40 - 1.20 mg/dL   GFR 66.40 >60.00 mL/min   Calcium 9.5 8.4 - 10.5 mg/dL  Hemoglobin A1c  Result Value Ref Range   Hgb A1c MFr Bld 6.7 (H) 4.6 - 6.5 %  Lipid panel  Result Value Ref Range   Cholesterol 163 0 - 200 mg/dL   Triglycerides 91.0 0.0 - 149.0 mg/dL   HDL 58.50 >39.00 mg/dL   VLDL 18.2 0.0 - 40.0 mg/dL   LDL Cholesterol 87 0 - 99 mg/dL   Total CHOL/HDL Ratio 3    NonHDL 104.73   POCT Urinalysis Dipstick (Automated)  Result Value Ref Range   Color, UA dark yellow    Clarity, UA clear    Glucose, UA Negative Negative   Bilirubin, UA neg    Ketones, UA  neg    Spec Grav, UA >=1.030 (A) 1.010 - 1.025   Blood, UA neg    pH, UA 6.0 5.0 - 8.0   Protein, UA Positive (A) Negative   Urobilinogen, UA 0.2 0.2 or 1.0 E.U./dL   Nitrite, UA neg    Leukocytes, UA Moderate (2+) (A) Negative   Received urine culture 5/8-  Septra should clear up infection. Lab update to pt

## 2020-08-21 ENCOUNTER — Ambulatory Visit (HOSPITAL_BASED_OUTPATIENT_CLINIC_OR_DEPARTMENT_OTHER)
Admission: RE | Admit: 2020-08-21 | Discharge: 2020-08-21 | Disposition: A | Discharge status: Home / Self Care | Payer: Medicare Other | Source: Ambulatory Visit | Attending: Family Medicine | Admitting: Family Medicine

## 2020-08-21 ENCOUNTER — Encounter: Payer: Self-pay | Admitting: Family Medicine

## 2020-08-21 ENCOUNTER — Ambulatory Visit: Payer: Medicare Other | Attending: Internal Medicine

## 2020-08-21 ENCOUNTER — Other Ambulatory Visit: Payer: Self-pay

## 2020-08-21 ENCOUNTER — Ambulatory Visit (INDEPENDENT_AMBULATORY_CARE_PROVIDER_SITE_OTHER): Payer: Medicare Other | Admitting: Family Medicine

## 2020-08-21 VITALS — BP 150/80 | HR 59 | Temp 97.2°F | Ht 65.0 in | Wt 162.0 lb

## 2020-08-21 DIAGNOSIS — R300 Dysuria: Secondary | ICD-10-CM | POA: Diagnosis not present

## 2020-08-21 DIAGNOSIS — Z23 Encounter for immunization: Secondary | ICD-10-CM

## 2020-08-21 DIAGNOSIS — E119 Type 2 diabetes mellitus without complications: Secondary | ICD-10-CM | POA: Diagnosis not present

## 2020-08-21 DIAGNOSIS — E782 Mixed hyperlipidemia: Secondary | ICD-10-CM | POA: Diagnosis not present

## 2020-08-21 DIAGNOSIS — I781 Nevus, non-neoplastic: Secondary | ICD-10-CM

## 2020-08-21 DIAGNOSIS — R059 Cough, unspecified: Secondary | ICD-10-CM | POA: Diagnosis not present

## 2020-08-21 DIAGNOSIS — I10 Essential (primary) hypertension: Secondary | ICD-10-CM | POA: Diagnosis not present

## 2020-08-21 DIAGNOSIS — R7303 Prediabetes: Secondary | ICD-10-CM

## 2020-08-21 LAB — POC URINALSYSI DIPSTICK (AUTOMATED)
Bilirubin, UA: NEGATIVE
Blood, UA: NEGATIVE
Glucose, UA: NEGATIVE
Ketones, UA: NEGATIVE
Nitrite, UA: NEGATIVE
Protein, UA: POSITIVE — AB
Spec Grav, UA: >=1.03 — AB (ref 1.010–1.025)
Urobilinogen, UA: 0.2 E.U./dL
pH, UA: 6 (ref 5.0–8.0)

## 2020-08-21 LAB — LIPID PANEL
Cholesterol: 163 mg/dL (ref 0–200)
HDL: 58.5 mg/dL (ref >39.00)
LDL Cholesterol: 87 mg/dL (ref 0–99)
NonHDL: 104.73
Total CHOL/HDL Ratio: 3
Triglycerides: 91 mg/dL (ref 0.0–149.0)
VLDL: 18.2 mg/dL (ref 0.0–40.0)

## 2020-08-21 LAB — BASIC METABOLIC PANEL
BUN: 20 mg/dL (ref 6–23)
CO2: 27 mEq/L (ref 19–32)
Calcium: 9.5 mg/dL (ref 8.4–10.5)
Chloride: 106 mEq/L (ref 96–112)
Creatinine, Ser: 0.87 mg/dL (ref 0.40–1.20)
GFR: 66.4 mL/min (ref >60.00)
Glucose, Bld: 136 mg/dL — ABNORMAL HIGH (ref 70–99)
Potassium: 4.1 mEq/L (ref 3.5–5.1)
Sodium: 140 mEq/L (ref 135–145)

## 2020-08-21 LAB — HEMOGLOBIN A1C: Hgb A1c MFr Bld: 6.7 % — ABNORMAL HIGH (ref 4.6–6.5)

## 2020-08-21 MED ORDER — SULFAMETHOXAZOLE-TRIMETHOPRIM 800-160 MG PO TABS: 1.0000 | ORAL_TABLET | Freq: Two times a day (BID) | ORAL | 0 refills | Status: DC

## 2020-08-21 NOTE — Patient Instructions (Addendum)
I would recommend getting the shingrix vaccine at your convenience, and also a 4th dose of covid 19 vaccine  BP goal- less than 135/85 Try taking 1.5 of your lisinopril tablets = 30 mg daily.  Let me know how your home BP responds to this  We will treat for presumed UTI today with septra - twice a day for 5 days I will be in touch with your urine culture and other labs asap  We will also get a chest film for you today    [facility] ?[address]   [address] [place],  [place]  [address] Main: [phone]

## 2020-08-21 NOTE — Progress Notes (Signed)
   Covid-19 Vaccination Clinic  Name:  Monica Kane    MRN: [id number] DOB: 08/19/1945  08/21/2020  Monica Kane was observed post Covid-19 immunization for 15 minutes without incident. She was provided with Vaccine Information Sheet and instruction to access the V-Safe system.   Monica Kane was instructed to call 911 with any severe reactions post vaccine: Marland Kitchen Difficulty breathing  . Swelling of face and throat  . A fast heartbeat  . A bad rash all over body  . Dizziness and weakness   Immunizations Administered    Name Date Dose VIS Date Route   PFIZER Comrnaty(Gray TOP) Covid-19 Vaccine 08/21/2020  9:59 AM 0.3 mL 03/27/2020 Intramuscular   Manufacturer: [employer]   Lot: [id number]   NDC: [phone]

## 2020-08-22 MED ORDER — METFORMIN HCL 500 MG PO TABS
500.0000 mg | ORAL_TABLET | Freq: Every day | ORAL | 3 refills | Status: DC
Start: 2020-08-22 — End: 2021-09-18

## 2020-08-23 LAB — URINE CULTURE
MICRO NUMBER:: 11854422
SPECIMEN QUALITY:: ADEQUATE

## 2020-08-24 ENCOUNTER — Encounter: Payer: Self-pay | Admitting: Family Medicine

## 2020-08-26 ENCOUNTER — Other Ambulatory Visit (HOSPITAL_BASED_OUTPATIENT_CLINIC_OR_DEPARTMENT_OTHER): Payer: Self-pay

## 2020-08-26 DIAGNOSIS — Z23 Encounter for immunization: Secondary | ICD-10-CM | POA: Diagnosis not present

## 2020-08-26 MED ORDER — PFIZER-BIONT COVID-19 VAC-TRIS 30 MCG/0.3ML IM SUSP
INTRAMUSCULAR | 0 refills | Status: DC
  Filled 2020-08-26: qty 0.3, 1d supply, fill #0

## 2020-09-24 DIAGNOSIS — S335XXA Sprain of ligaments of lumbar spine, initial encounter: Secondary | ICD-10-CM | POA: Diagnosis not present

## 2020-10-10 ENCOUNTER — Encounter: Payer: Self-pay | Admitting: Family Medicine

## 2020-10-10 DIAGNOSIS — R0781 Pleurodynia: Secondary | ICD-10-CM

## 2020-10-10 NOTE — Telephone Encounter (Signed)
Called pt to discuss-she reports no shortness of breath, no cough or fever, no exertional chest pain.  She feels relatively certain this pain is musculoskeletal, it occurs if she presses on the area or moves her trunk and mostly bothers her at night.  I ordered left-sided rib films to be done at [facility] at her convenience.  She will contact me next week if she needs to be seen.  She will seek care if getting worse

## 2020-10-13 ENCOUNTER — Ambulatory Visit
Admission: RE | Admit: 2020-10-13 | Discharge: 2020-10-13 | Disposition: A | Discharge status: Home / Self Care | Payer: Medicare Other | Source: Ambulatory Visit | Attending: Family Medicine | Admitting: Family Medicine

## 2020-10-13 ENCOUNTER — Other Ambulatory Visit: Payer: Self-pay

## 2020-10-13 ENCOUNTER — Encounter: Payer: Self-pay | Admitting: Family Medicine

## 2020-10-13 DIAGNOSIS — M419 Scoliosis, unspecified: Secondary | ICD-10-CM | POA: Diagnosis not present

## 2020-10-13 DIAGNOSIS — R0781 Pleurodynia: Secondary | ICD-10-CM | POA: Diagnosis not present

## 2020-10-16 ENCOUNTER — Other Ambulatory Visit: Payer: Self-pay | Admitting: Family Medicine

## 2020-10-26 DIAGNOSIS — Z20822 Contact with and (suspected) exposure to covid-19: Secondary | ICD-10-CM | POA: Diagnosis not present

## 2020-12-01 ENCOUNTER — Encounter: Payer: Self-pay | Admitting: Family Medicine

## 2020-12-07 ENCOUNTER — Other Ambulatory Visit: Payer: Self-pay | Admitting: Family Medicine

## 2020-12-07 DIAGNOSIS — I10 Essential (primary) hypertension: Secondary | ICD-10-CM

## 2020-12-10 ENCOUNTER — Encounter: Payer: Self-pay | Admitting: Family Medicine

## 2020-12-10 DIAGNOSIS — I10 Essential (primary) hypertension: Secondary | ICD-10-CM

## 2020-12-10 MED ORDER — LISINOPRIL 40 MG PO TABS: 40.0000 mg | ORAL_TABLET | Freq: Every day | ORAL | 3 refills | Status: DC

## 2020-12-12 ENCOUNTER — Other Ambulatory Visit: Payer: Self-pay | Admitting: Family Medicine

## 2020-12-12 DIAGNOSIS — G4700 Insomnia, unspecified: Secondary | ICD-10-CM

## 2020-12-23 ENCOUNTER — Other Ambulatory Visit (HOSPITAL_BASED_OUTPATIENT_CLINIC_OR_DEPARTMENT_OTHER): Payer: Self-pay

## 2021-01-12 ENCOUNTER — Other Ambulatory Visit: Payer: Self-pay | Admitting: Family Medicine

## 2021-01-12 DIAGNOSIS — Z1231 Encounter for screening mammogram for malignant neoplasm of breast: Secondary | ICD-10-CM

## 2021-01-13 DIAGNOSIS — M25561 Pain in right knee: Secondary | ICD-10-CM | POA: Diagnosis not present

## 2021-01-15 DIAGNOSIS — Z23 Encounter for immunization: Secondary | ICD-10-CM | POA: Diagnosis not present

## 2021-01-19 DIAGNOSIS — M25561 Pain in right knee: Secondary | ICD-10-CM | POA: Diagnosis not present

## 2021-01-21 ENCOUNTER — Telehealth: Payer: Self-pay | Admitting: Family Medicine

## 2021-01-21 NOTE — Telephone Encounter (Signed)
Jared from [facility] rx faxed information over regarding patient meds. He mentioned it was just general information.

## 2021-01-23 NOTE — Telephone Encounter (Signed)
Will look out for this.

## 2021-02-11 ENCOUNTER — Ambulatory Visit
Admission: RE | Admit: 2021-02-11 | Discharge: 2021-02-11 | Disposition: A | Discharge status: Home / Self Care | Payer: Medicare Other | Source: Ambulatory Visit | Attending: Family Medicine | Admitting: Family Medicine

## 2021-02-11 ENCOUNTER — Ambulatory Visit: Payer: Medicare Other

## 2021-02-11 DIAGNOSIS — Z1231 Encounter for screening mammogram for malignant neoplasm of breast: Secondary | ICD-10-CM | POA: Diagnosis not present

## 2021-02-12 ENCOUNTER — Other Ambulatory Visit: Payer: Self-pay | Admitting: Family Medicine

## 2021-02-12 DIAGNOSIS — E785 Hyperlipidemia, unspecified: Secondary | ICD-10-CM

## 2021-02-24 ENCOUNTER — Other Ambulatory Visit: Payer: Self-pay | Admitting: Family Medicine

## 2021-02-24 DIAGNOSIS — G609 Hereditary and idiopathic neuropathy, unspecified: Secondary | ICD-10-CM

## 2021-03-10 ENCOUNTER — Encounter: Payer: Self-pay | Admitting: Family Medicine

## 2021-03-16 ENCOUNTER — Telehealth: Payer: Self-pay | Admitting: Family Medicine

## 2021-03-16 NOTE — Telephone Encounter (Signed)
Left message for patient to call back and schedule Medicare Annual Wellness Visit (AWV) in office.   If not able to come in office, please offer to do virtually or by telephone.  Left office number and my jabber [phone].  Last AWV:07/17/2018  Please schedule at anytime with Nurse Health Advisor.

## 2021-03-20 ENCOUNTER — Other Ambulatory Visit: Payer: Self-pay | Admitting: Family Medicine

## 2021-03-20 DIAGNOSIS — M25561 Pain in right knee: Secondary | ICD-10-CM | POA: Diagnosis not present

## 2021-03-20 DIAGNOSIS — M5416 Radiculopathy, lumbar region: Secondary | ICD-10-CM | POA: Diagnosis not present

## 2021-03-20 DIAGNOSIS — E785 Hyperlipidemia, unspecified: Secondary | ICD-10-CM

## 2021-03-26 DIAGNOSIS — Z20822 Contact with and (suspected) exposure to covid-19: Secondary | ICD-10-CM | POA: Diagnosis not present

## 2021-04-06 DIAGNOSIS — M5416 Radiculopathy, lumbar region: Secondary | ICD-10-CM | POA: Diagnosis not present

## 2021-04-22 ENCOUNTER — Other Ambulatory Visit: Payer: Self-pay | Admitting: Family Medicine

## 2021-04-22 DIAGNOSIS — E785 Hyperlipidemia, unspecified: Secondary | ICD-10-CM

## 2021-04-23 DIAGNOSIS — M545 Low back pain, unspecified: Secondary | ICD-10-CM | POA: Diagnosis not present

## 2021-04-24 ENCOUNTER — Ambulatory Visit: Payer: Medicare Other | Admitting: Family

## 2021-04-24 DIAGNOSIS — M5416 Radiculopathy, lumbar region: Secondary | ICD-10-CM | POA: Diagnosis not present

## 2021-04-30 ENCOUNTER — Ambulatory Visit: Payer: Medicare Other | Admitting: Family Medicine

## 2021-05-01 NOTE — Patient Instructions (Addendum)
It was good to see you again today, I am sorry you are not feeling well  I will be in touch with your labs, assuming all is well please see me in about 6 months RSV test pending as well

## 2021-05-01 NOTE — Progress Notes (Addendum)
[facility] [address], [place], [place] [address] [phone]  Date:  05/04/2021   Name:  Monica Kane   DOB:  Jun 29, 1945   MRN:  [id number]  PCP:  Darreld Mclean, MD    Chief Complaint: Sore Throat (Pt says this is a follow up to her URI sxs that were sent via mychart around 02/2021. She says she feels some better.)   History of Present Illness:  Monica Kane is a 76 y.o. very pleasant female patient who presents with the following:  Patient seen today with concern of illness Most recent visit with myself in May 2022- history of hypertension, prediabetes hyperlipidemia, sleep apnea treated with oral appliance, vitamin D deficiency  Pt notes she got sick in November of last year- her sinuses and throat felt congested, she had to prop up to sleep at night Seemed like a cold but it went on for a long time! She noted she was sleeping a whole lot-up to 12 hours a day during the height of her illness No fever, some dry cough She tested for covid several times and was negative.  However, her husband actually had COVID and the flu during this time Did not test for flu herself No GI symptoms Her oxygen has been normal For the last approx 2 weeks she has felt much better- less fatigue, her throat is still raw.   She is still getting some green/ brown mucus from her throat  Patient states she is here today mostly because her husband was worried about her.  He wanted her to get tested for RSV  Foot exam due- update today  Eye exam- pt notes about 18 months ago, she will schedule for this spring  Shingles vaccine COVID booster- done  Flu shot- did 01/15/21 A1c is due Most recent lab work in May, [place], lipid, A1c  Lab Results  Component Value Date   HGBA1C 6.7 (H) 08/21/2020    Patient Active Problem List   Diagnosis Date Noted   Acquired claw toe, right    Achilles tendon contracture, right    OSA  (obstructive sleep apnea) 07/26/2019   Vertigo 10/03/2018   Visual blurriness 10/03/2018   Controlled type 2 diabetes mellitus without complication, without long-term current use of insulin ([facility]) 06/05/2015   Primary localized osteoarthritis of right knee    Anxiety    Chest pain, midsternal 07/23/2013   Osteopenia 03/23/2013   Kidney stones, calcium oxalate 03/23/2013   Hypertension 03/31/2011   Hyperlipidemia 03/31/2011   Vitamin D deficiency 03/31/2011   Hallux rigidus 03/24/2011   Metatarsalgia of left foot 03/24/2011   Lateral epicondylitis of left elbow 02/24/2011   Migraine 02/24/2011   Depression 02/24/2011   Arthritis 02/24/2011    Past Medical History:  Diagnosis Date   Anxiety    takes Ativan nightly   Depression    takes Zoloft daily   History of colon polyps    History of kidney stones    History of staph infection 11/2013   Hyperlipidemia    takes Crestor nightly    Hypertension    takes Lisinopril and Coreg daily   Joint pain    Joint swelling    Neuropathy    takes Elavil nightly   Pneumonia    Pre-diabetes    Primary localized osteoarthritis of right knee    Sleep apnea    uses oral appliance    Past Surgical  History:  Procedure Laterality Date   ABDOMINAL HYSTERECTOMY  1989   APPENDECTOMY     CARDIAC CATHETERIZATION  2015   CERVICAL SPINE SURGERY  2004   C 5-6   COLONOSCOPY     colonscopy     DIAGNOSTIC LAPAROSCOPY  1976   fibroid removed   ELBOW ARTHROSCOPY  2010, 2012   EYE SURGERY Bilateral    cataract   FOOT SURGERY Left 2013   GASTROCNEMIUS RECESSION Right 10/09/2019   Procedure: RIGHT GASTROCNEMIUS RECESSION;  Surgeon: Newt Minion, MD;  Location: [facility];  Service: Orthopedics;  Laterality: Right;   KNEE ARTHROSCOPY  Oct 2014   KNEE ARTHROSCOPY W/ MENISCECTOMY Right 01/31/2013   [facility]   LATERAL EPICONDYLE RELEASE  03/01/2011   Procedure: TENNIS ELBOW RELEASE;  Surgeon: Lorn Junes, MD;  Location: [place] [facility];  Service: Orthopedics;  Laterality: Left;  TENOTOMY ELBOW LATERAL EPICONDYLITIS TENNIS ELBOW   lateral release right elbow  2010   left foot surgery  2013   mortons neuroma   LEFT HEART CATHETERIZATION WITH CORONARY ANGIOGRAM N/A 07/23/2013   Procedure: LEFT HEART CATHETERIZATION WITH CORONARY ANGIOGRAM;  Surgeon: Sinclair Grooms, MD;  Location: [facility];  Service: Cardiovascular;  Laterality: N/A;   PAROTID GLAND TUMOR EXCISION  1976   benign   PAROTID GLAND TUMOR EXCISION  1976   rt   SHOULDER ARTHROSCOPY DISTAL CLAVICLE EXCISION AND OPEN ROTATOR CUFF REPAIR  2006   SHOULDER ARTHROSCOPY W/ ROTATOR CUFF REPAIR  2006   TOTAL KNEE ARTHROPLASTY Right 02/18/2014   Procedure: TOTAL KNEE ARTHROPLASTY;  Surgeon: Lorn Junes, MD;  Location: [facility];  Service: Orthopedics;  Laterality: Right;   WEIL OSTEOTOMY Right 10/09/2019   Procedure: WEIL OSTEOTOMY RIGHT 2ND METATARSAL;  Surgeon: Newt Minion, MD;  Location: [facility];  Service: Orthopedics;  Laterality: Right;    Social History   Tobacco Use   Smoking status: Never   Smokeless tobacco: Never  Substance Use Topics   Alcohol use: Yes    Comment: social   Drug use: No    Family History  Problem Relation Age of Onset   Cancer Mother        lung   Cancer Father        lung; + tobacco   Heart disease Maternal Grandfather        heart attack   Heart disease Paternal Grandfather        heart attack   Diabetes Sister    Hypertension Sister    Hyperlipidemia Sister    Breast cancer Neg Hx     Allergies  Allergen Reactions   Penicillins Swelling and Rash    Medication list has been reviewed and updated.  Current Outpatient Medications on File Prior to Visit  Medication Sig Dispense Refill   amitriptyline (ELAVIL) 25 MG tablet TAKE ONE TABLET BY MOUTH AT BEDTIME. 90 tablet 1   carvedilol (COREG) 3.125 MG tablet TAKE 1 TABLET BY MOUTH TWICE DAILY WITH A MEAL. (Patient taking  differently: at bedtime.) 180 tablet 2   COVID-19 mRNA Vac-TriS, Pfizer, (PFIZER-BIONT COVID-19 VAC-TRIS) SUSP injection Inject into the muscle. 0.3 mL 0   diclofenac (VOLTAREN) 50 MG EC tablet Take 50 mg by mouth 2 (two) times daily as needed.     lisinopril (ZESTRIL) 40 MG tablet Take 1 tablet (40 mg total) by mouth daily. 90 tablet 3   metFORMIN (GLUCOPHAGE) 500 MG tablet Take 1 tablet (  500 mg total) by mouth daily. May start with a 1/2 tablet and increase as tolerated 90 tablet 3   rosuvastatin (CRESTOR) 10 MG tablet TAKE 1 TABLET ONCE DAILY. 30 tablet 0   sertraline (ZOLOFT) 100 MG tablet Take 2.5 tablets (250 mg total) by mouth daily. 225 tablet 3   traZODone (DESYREL) 50 MG tablet TAKE 1 OR 2 TABLETS AT BEDTIME. 180 tablet 1   No current facility-administered medications on file prior to visit.    Review of Systems:  As per HPI- otherwise negative.   Physical Examination: Vitals:   05/04/21 0856  BP: 132/72  Pulse: 62  Resp: 18  Temp: 97.8 F (36.6 C)  SpO2: 98%   Vitals:   05/04/21 0856  Weight: 167 lb 3.2 oz (75.8 kg)  Height: 5\' 5"  (1.651 m)   Body mass index is 27.82 kg/m. Ideal Body Weight: Weight in (lb) to have BMI = 25: 149.9  GEN: no acute distress.  Looks well, mild overweight HEENT: Atraumatic, Normocephalic.  Bilateral TM wnl, oropharynx normal.  PEERL,EOMI.   Ears and Nose: No external deformity. CV: RRR, No M/G/R. No JVD. No thrill. No extra heart sounds. PULM: CTA B, no wheezes, crackles, rhonchi. No retractions. No resp. distress. No accessory muscle use.  Lungs sound good today ABD: S, NT, ND, +BS. No rebound. No HSM. EXTR: No c/c/e PSYCH: Normally interactive. Conversant.    Assessment and Plan: Subacute cough - Plan: RSV screen (nasopharyngeal)not at [facility]  Essential hypertension - Plan: CBC, Comprehensive metabolic panel  Mixed hyperlipidemia  Controlled type 2 diabetes mellitus without complication, without long-term current use of  insulin (HCC) - Plan: Hemoglobin A1c  Fatigue, unspecified type - Plan: TSH  Here today with recent concern of cough and congestion which went on for about 2 months.  She has actually felt better for the last 2 weeks She tested for COVID several times and was negative per her report Tested for RSV today Other routine labs are pending as above She is up-to-date on her flu and COVID vaccinations Patient declines a chest x-ray today Will plan further follow- up pending labs-asked her to let me know if not continuing to feel improved  Signed Lamar Blinks, MD  Addendum 1/17, received labs as below.  Message to patient A1c has gone up from 6.7 in May 2022 GFR is lower than normal Results for orders placed or performed in visit on 05/04/21  RSV screen (nasopharyngeal)not at [facility]   Specimen: Blood  Result Value Ref Range   MICRO NUMBER: [id number]    SPECIMEN QUALITY: Adequate    SOURCE: NOT GIVEN    STATUS: FINAL    RESULT: Not Detected    COMMENT:      This test is approved for nasopharyngeal washes, aspirates or swabs in FDA approved viral transport medium or saline. The reliability of testing from other sources has not been established.  CBC  Result Value Ref Range   WBC 7.0 4.0 - 10.5 K/uL   RBC 4.92 3.87 - 5.11 Mil/uL   Platelets 215.0 150.0 - 400.0 K/uL   Hemoglobin 12.9 12.0 - 15.0 g/dL   HCT 40.4 36.0 - 46.0 %   MCV 82.1 78.0 - 100.0 fl   MCHC 32.0 30.0 - 36.0 g/dL   RDW 13.8 11.5 - 15.5 %  Comprehensive metabolic panel  Result Value Ref Range   Sodium 141 135 - 145 mEq/L   Potassium 4.5 3.5 - 5.1 mEq/L   Chloride 105 96 -  112 mEq/L   CO2 27 19 - 32 mEq/L   Glucose, Bld 121 (H) 70 - 99 mg/dL   BUN 25 (H) 6 - 23 mg/dL   Creatinine, Ser 1.19 0.40 - 1.20 mg/dL   Total Bilirubin 0.5 0.2 - 1.2 mg/dL   Alkaline Phosphatase 62 39 - 117 U/L   AST 15 0 - 37 U/L   ALT 18 0 - 35 U/L   Total Protein 6.5 6.0 - 8.3 g/dL   Albumin 4.2 3.5 - 5.2 g/dL   GFR 45.37 (L) >60.00  mL/min   Calcium 9.5 8.4 - 10.5 mg/dL  Hemoglobin A1c  Result Value Ref Range   Hgb A1c MFr Bld 7.2 (H) 4.6 - 6.5 %  TSH  Result Value Ref Range   TSH 4.19 0.35 - 5.50 uIU/mL

## 2021-05-04 ENCOUNTER — Encounter: Payer: Self-pay | Admitting: Family Medicine

## 2021-05-04 ENCOUNTER — Ambulatory Visit (INDEPENDENT_AMBULATORY_CARE_PROVIDER_SITE_OTHER): Payer: Medicare Other | Admitting: Family Medicine

## 2021-05-04 VITALS — BP 132/72 | HR 62 | Temp 97.8°F | Resp 18 | Ht 65.0 in | Wt 167.2 lb

## 2021-05-04 DIAGNOSIS — N289 Disorder of kidney and ureter, unspecified: Secondary | ICD-10-CM | POA: Diagnosis not present

## 2021-05-04 DIAGNOSIS — E119 Type 2 diabetes mellitus without complications: Secondary | ICD-10-CM | POA: Diagnosis not present

## 2021-05-04 DIAGNOSIS — R052 Subacute cough: Secondary | ICD-10-CM | POA: Diagnosis not present

## 2021-05-04 DIAGNOSIS — R5383 Other fatigue: Secondary | ICD-10-CM | POA: Diagnosis not present

## 2021-05-04 DIAGNOSIS — I10 Essential (primary) hypertension: Secondary | ICD-10-CM

## 2021-05-04 DIAGNOSIS — E782 Mixed hyperlipidemia: Secondary | ICD-10-CM | POA: Diagnosis not present

## 2021-05-04 LAB — COMPREHENSIVE METABOLIC PANEL
ALT: 18 U/L (ref 0–35)
AST: 15 U/L (ref 0–37)
Albumin: 4.2 g/dL (ref 3.5–5.2)
Alkaline Phosphatase: 62 U/L (ref 39–117)
BUN: 25 mg/dL — ABNORMAL HIGH (ref 6–23)
CO2: 27 mEq/L (ref 19–32)
Calcium: 9.5 mg/dL (ref 8.4–10.5)
Chloride: 105 mEq/L (ref 96–112)
Creatinine, Ser: 1.19 mg/dL (ref 0.40–1.20)
GFR: 45.37 mL/min — ABNORMAL LOW (ref >60.00)
Glucose, Bld: 121 mg/dL — ABNORMAL HIGH (ref 70–99)
Potassium: 4.5 mEq/L (ref 3.5–5.1)
Sodium: 141 mEq/L (ref 135–145)
Total Bilirubin: 0.5 mg/dL (ref 0.2–1.2)
Total Protein: 6.5 g/dL (ref 6.0–8.3)

## 2021-05-04 LAB — CBC
HCT: 40.4 % (ref 36.0–46.0)
Hemoglobin: 12.9 g/dL (ref 12.0–15.0)
MCHC: 32 g/dL (ref 30.0–36.0)
MCV: 82.1 fl (ref 78.0–100.0)
Platelets: 215 10*3/uL (ref 150.0–400.0)
RBC: 4.92 Mil/uL (ref 3.87–5.11)
RDW: 13.8 % (ref 11.5–15.5)
WBC: 7 10*3/uL (ref 4.0–10.5)

## 2021-05-04 LAB — HEMOGLOBIN A1C: Hgb A1c MFr Bld: 7.2 % — ABNORMAL HIGH (ref 4.6–6.5)

## 2021-05-04 LAB — TSH: TSH: 4.19 u[IU]/mL (ref 0.35–5.50)

## 2021-05-05 ENCOUNTER — Encounter: Payer: Self-pay | Admitting: Family Medicine

## 2021-05-05 LAB — RSV SCREEN (NASOPHARYNGEAL) NOT AT ARMC
MICRO NUMBER:: 12875224
RESULT:: NOT DETECTED
SPECIMEN QUALITY:: ADEQUATE

## 2021-05-05 NOTE — Addendum Note (Signed)
Addended by: Lamar Blinks C on: 05/05/2021 05:24 AM   Modules accepted: Orders

## 2021-05-06 ENCOUNTER — Encounter: Payer: Self-pay | Admitting: Family Medicine

## 2021-05-08 DIAGNOSIS — M5416 Radiculopathy, lumbar region: Secondary | ICD-10-CM | POA: Diagnosis not present

## 2021-05-18 ENCOUNTER — Other Ambulatory Visit: Payer: Self-pay | Admitting: Family Medicine

## 2021-05-18 DIAGNOSIS — E785 Hyperlipidemia, unspecified: Secondary | ICD-10-CM

## 2021-05-19 ENCOUNTER — Encounter: Payer: Self-pay | Admitting: Family Medicine

## 2021-05-19 NOTE — Progress Notes (Signed)
[facility] [address], [place], [place] [address] [phone]  Date:  05/20/2021   Name:  Monica Kane   DOB:  1946-02-28   MRN:  [id number]  PCP:  Darreld Mclean, MD    Chief Complaint: pre-syncopal episode (Concerns/ questions: none)   History of Present Illness:  Monica Kane is a 76 y.o. very pleasant female patient who presents with the following:  Patient seen today with concern of a presyncopal episode which occurred recently-  history of hypertension, prediabetes hyperlipidemia, sleep apnea treated with oral appliance, vitamin D deficiency  Most recent visit with myself was January 16 for an upper respiratory infection She had tested negative for COVID-19, we also tested her for RSV which was negative.  She added a humidier to her room and this helped with her cough a lot  A1c had gone up a bit to 7.2%  She contacted me yesterday with the following MyChart message: I was wondering if I might get my blood checked a little earlier than on the scheduled Feb 16th. Im afraid I may have been eating in such a way that [place] messed everything up. I started eating only certain fruits and vegetables and limited non red meat protein since my blood work up on Jan 16th. Yesterday I tried to dance for the first time since the back injection (which was successful!!) and we had to keep stopping because I was breathless, weak, and a little dizzy. 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 However, last night when I got up to go to the bathroom, I was weak and dizzy again and realized I couldnt tell what was in front of me (lights were off but I usually can see where Im going). Looked like a dark Engineer, agricultural. Knees buckled and I fell over backwards. Did not hit head, just scraped my back and hurt my shoulder on a chair  as I fell.  I was concerned and called her.  She admitted to decreasing her salt intake dramatically, also was cutting calories Today is Wednesday- the episode described above occurred Monday evening at about 11:30.  She had already been asleep, then got up to go to the bathroom.  She did not have LOC but she did fall as described above  At recent lab work her GFR had fallen-patient got concerned She has been trying to eat a "kidney friendly" diet and cut out salt 2 weeks ago She also reduced her potassium and phosphorus  She states she felt like her BP or blood sugar was low during her diet She ate a normal meal last night  This was her only episode of almost fainting However she also notes feeling SOB with any exertion- off and on for at least one month No chest pain  BP Readings from Last 3 Encounters:  05/20/21 123/70  05/04/21 132/72  08/21/20 (!) 150/80     Patient Active Problem List   Diagnosis Date Noted   Acquired claw toe, right    Achilles tendon contracture, right    OSA (obstructive sleep apnea) 07/26/2019   Vertigo 10/03/2018   Visual blurriness 10/03/2018   Controlled type 2 diabetes mellitus without complication, without long-term current use of insulin ([facility]) 06/05/2015   Primary localized osteoarthritis of right knee  Anxiety    Chest pain, midsternal 07/23/2013   Osteopenia 03/23/2013   Kidney stones, calcium oxalate 03/23/2013   Hypertension 03/31/2011   Hyperlipidemia 03/31/2011   Vitamin D deficiency 03/31/2011   Hallux rigidus 03/24/2011   Metatarsalgia of left foot 03/24/2011   Lateral epicondylitis of left elbow 02/24/2011   Migraine 02/24/2011   Depression 02/24/2011   Arthritis 02/24/2011    Past Medical History:  Diagnosis Date   Anxiety    takes Ativan nightly   Depression    takes Zoloft daily   History of colon polyps    History of kidney stones    History of staph infection 11/2013   Hyperlipidemia    takes Crestor nightly     Hypertension    takes Lisinopril and Coreg daily   Joint pain    Joint swelling    Neuropathy    takes Elavil nightly   Pneumonia    Pre-diabetes    Primary localized osteoarthritis of right knee    Sleep apnea    uses oral appliance    Past Surgical History:  Procedure Laterality Date   [place]  2015   CERVICAL SPINE SURGERY  2004   C 5-6   COLONOSCOPY     colonscopy     DIAGNOSTIC LAPAROSCOPY  1976   fibroid removed   ELBOW ARTHROSCOPY  2010, 2012   EYE SURGERY Bilateral    cataract   FOOT SURGERY Left 2013   GASTROCNEMIUS RECESSION Right 10/09/2019   Procedure: RIGHT GASTROCNEMIUS RECESSION;  Surgeon: Newt Minion, MD;  Location: [place] [facility];  Service: Orthopedics;  Laterality: Right;   KNEE ARTHROSCOPY  Oct 2014   KNEE ARTHROSCOPY W/ MENISCECTOMY Right 01/31/2013   [facility]   LATERAL EPICONDYLE RELEASE  03/01/2011   Procedure: TENNIS ELBOW RELEASE;  Surgeon: Lorn Junes, MD;  Location: [place];  Service: Orthopedics;  Laterality: Left;  TENOTOMY ELBOW LATERAL EPICONDYLITIS TENNIS ELBOW   lateral release right elbow  2010   left foot surgery  2013   mortons neuroma   LEFT HEART CATHETERIZATION WITH CORONARY ANGIOGRAM N/A 07/23/2013   Procedure: LEFT HEART CATHETERIZATION WITH CORONARY ANGIOGRAM;  Surgeon: Sinclair Grooms, MD;  Location: [facility] CATH LAB;  Service: Cardiovascular;  Laterality: N/A;   PAROTID GLAND TUMOR EXCISION  1976   benign   PAROTID GLAND TUMOR EXCISION  1976   rt   SHOULDER ARTHROSCOPY DISTAL CLAVICLE EXCISION AND OPEN ROTATOR CUFF REPAIR  2006   SHOULDER ARTHROSCOPY W/ ROTATOR CUFF REPAIR  2006   TOTAL KNEE ARTHROPLASTY Right 02/18/2014   Procedure: TOTAL KNEE ARTHROPLASTY;  Surgeon: Lorn Junes, MD;  Location: [facility];  Service: Orthopedics;  Laterality: Right;   WEIL OSTEOTOMY Right 10/09/2019   Procedure: WEIL OSTEOTOMY RIGHT 2ND METATARSAL;   Surgeon: Newt Minion, MD;  Location: [facility];  Service: Orthopedics;  Laterality: Right;    Social History   Tobacco Use   Smoking status: Never   Smokeless tobacco: Never  Substance Use Topics   Alcohol use: Yes    Comment: social   Drug use: No    Family History  Problem Relation Age of Onset   Cancer Mother        lung   Cancer Father        lung; + tobacco   Heart disease Maternal Grandfather        heart  attack   Heart disease Paternal Grandfather        heart attack   Diabetes Sister    Hypertension Sister    Hyperlipidemia Sister    Breast cancer Neg Hx     Allergies  Allergen Reactions   Penicillins Swelling and Rash    Medication list has been reviewed and updated.  Current Outpatient Medications on File Prior to Visit  Medication Sig Dispense Refill   amitriptyline (ELAVIL) 25 MG tablet TAKE ONE TABLET BY MOUTH AT BEDTIME. 90 tablet 1   carvedilol (COREG) 3.125 MG tablet TAKE 1 TABLET BY MOUTH TWICE DAILY WITH A MEAL. (Patient taking differently: at bedtime.) 180 tablet 2   lisinopril (ZESTRIL) 40 MG tablet Take 1 tablet (40 mg total) by mouth daily. 90 tablet 3   metFORMIN (GLUCOPHAGE) 500 MG tablet Take 1 tablet (500 mg total) by mouth daily. May start with a 1/2 tablet and increase as tolerated 90 tablet 3   rosuvastatin (CRESTOR) 10 MG tablet TAKE ONE TABLET BY MOUTH DAILY 30 tablet 0   sertraline (ZOLOFT) 100 MG tablet Take 2.5 tablets (250 mg total) by mouth daily. 225 tablet 3   traZODone (DESYREL) 50 MG tablet TAKE 1 OR 2 TABLETS AT BEDTIME. 180 tablet 1   No current facility-administered medications on file prior to visit.    Review of Systems:  As per HPI- otherwise negative.   Physical Examination: Vitals:   05/20/21 1022  BP: 123/70  Pulse: 70  Resp: 18  Temp: 98 F (36.7 C)  SpO2: 98%   Vitals:   05/20/21 1022  Weight: 160 lb 6.4 oz (72.8 kg)  Height: 5\' 5"  (1.651 m)   Body mass index is 26.69  kg/m. Ideal Body Weight: Weight in (lb) to have BMI = 25: 149.9  GEN: no acute distress.  Minimal overweight, looks well HEENT: Atraumatic, Normocephalic. Bilateral TM wnl, oropharynx normal.  PEERL,EOMI.   Ears and Nose: No external deformity. CV: RRR, No M/G/R. No JVD. No thrill. No extra heart sounds. PULM: CTA B, no wheezes, crackles, rhonchi. No retractions. No resp. distress. No accessory muscle use. ABD: S, NT, ND, +BS. No rebound. No HSM. EXTR: No c/c/e PSYCH: Normally interactive. Conversant.   EKG:SR, non specific T wave changes noted in V2- otherwise no significant changes noted compared with tracing 6/21  Orthostatic VS for the past 72 hrs (Last 3 readings):  Orthostatic BP Patient Position BP Location Cuff Size Orthostatic Pulse  05/20/21 1053 100/60 Standing Left Arm Normal 73  05/20/21 1052 122/70 Sitting Left Arm Normal 71  05/20/21 1051 120/68 Supine Left Arm Normal 66    Assessment and Plan: SOB (shortness of breath) - Plan: D-Dimer, Quantitative, Troponin I (High Sensitivity), CT Angio Chest W/Cm &/Or Wo Cm, CANCELED: Troponin I (High Sensitivity)  Essential hypertension - Plan: CBC, Comprehensive metabolic panel  Pre-syncope - Plan: EKG 12-Lead  Renal insufficiency - Plan: Comprehensive metabolic panel, Phosphorus  Chest pain, unspecified type - Plan: CT Angio Chest W/Cm &/Or Wo Cm  Positive D dimer - Plan: CT Angio Chest W/Cm &/Or Wo Cm  Patient seen today with a couple concerns. She recently was noted to have decrease in her GFR to 45.  She got worried about this, read about a "kidney friendly" diet online and started dramatic salt restriction We suspect this may have contributed to orthostatic hypotension and a presyncopal episode which occurred 2 days ago.  She still displays some Ortho static hypotension as above I encouraged her  to return to her normal diet, advised that we will keep a close eye on her kidney function We discussed dropping her dose of  lisinopril-however decided to stay with current dose as she plans to normalize her diet  She also states she has felt short of breath upon any exertion for several weeks Need to consider pulmonary embolism.  EKG is nonacute.  She denies any chest pain, will also check a troponin If D-dimer is positive we will proceed to a CT scan Offered to do a chest x-ray; patient declines for now as we may be getting a CT anyway Signed Lamar Blinks, MD  Received normal troponin and positive D dimer- called pt  Results for orders placed or performed in visit on 05/20/21  D-Dimer, Quantitative  Result Value Ref Range   D-Dimer, Quant 1.24 (H) <0.50 mcg/mL FEU  Troponin I (High Sensitivity)  Result Value Ref Range   High Sens Troponin I 7 2 - 17 ng/L   Will order Ct angiogram -she states understanding  Received labs as below, message to patient  Results for orders placed or performed in visit on 05/20/21  CBC  Result Value Ref Range   WBC 9.7 4.0 - 10.5 K/uL   RBC 4.79 3.87 - 5.11 Mil/uL   Platelets 193.0 150.0 - 400.0 K/uL   Hemoglobin 12.8 12.0 - 15.0 g/dL   HCT 38.7 36.0 - 46.0 %   MCV 80.7 78.0 - 100.0 fl   MCHC 33.1 30.0 - 36.0 g/dL   RDW 13.1 11.5 - 15.5 %  Comprehensive metabolic panel  Result Value Ref Range   Sodium 138 135 - 145 mEq/L   Potassium 4.0 3.5 - 5.1 mEq/L   Chloride 103 96 - 112 mEq/L   CO2 30 19 - 32 mEq/L   Glucose, Bld 117 (H) 70 - 99 mg/dL   BUN 26 (H) 6 - 23 mg/dL   Creatinine, Ser 1.27 (H) 0.40 - 1.20 mg/dL   Total Bilirubin 0.4 0.2 - 1.2 mg/dL   Alkaline Phosphatase 64 39 - 117 U/L   AST 16 0 - 37 U/L   ALT 15 0 - 35 U/L   Total Protein 6.9 6.0 - 8.3 g/dL   Albumin 4.2 3.5 - 5.2 g/dL   GFR 41.95 (L) >60.00 mL/min   Calcium 10.0 8.4 - 10.5 mg/dL  D-Dimer, Quantitative  Result Value Ref Range   D-Dimer, Quant 1.24 (H) <0.50 mcg/mL FEU  Phosphorus  Result Value Ref Range   Phosphorus 3.7 2.3 - 4.6 mg/dL  Troponin I (High Sensitivity)  Result Value  Ref Range   High Sens Troponin I 7 2 - 17 ng/L

## 2021-05-19 NOTE — Telephone Encounter (Signed)
I called her back- she states she had a pre-syncopal episode yesterday  She has cut out all salt and is consuming a low K diet which is "kidney friendly" She notes she is feeling weak and has to sit down when she tries to be active today.  However no more presyncope.  She denies any chest pain or shortness of breath  We discussed having her seen in the emergency room, she declines to do this and does not feel it is necessary today.  I did make her appointment to see me tomorrow morning-I asked her to be seen at the ER if she is getting worse in the meantime

## 2021-05-20 ENCOUNTER — Encounter: Payer: Self-pay | Admitting: Family Medicine

## 2021-05-20 ENCOUNTER — Ambulatory Visit (INDEPENDENT_AMBULATORY_CARE_PROVIDER_SITE_OTHER): Payer: Medicare Other | Admitting: Family Medicine

## 2021-05-20 ENCOUNTER — Telehealth (HOSPITAL_BASED_OUTPATIENT_CLINIC_OR_DEPARTMENT_OTHER): Payer: Self-pay

## 2021-05-20 ENCOUNTER — Other Ambulatory Visit: Payer: Self-pay | Admitting: Family Medicine

## 2021-05-20 VITALS — BP 123/70 | HR 70 | Temp 98.0°F | Resp 18 | Ht 65.0 in | Wt 160.4 lb

## 2021-05-20 DIAGNOSIS — I10 Essential (primary) hypertension: Secondary | ICD-10-CM | POA: Diagnosis not present

## 2021-05-20 DIAGNOSIS — N289 Disorder of kidney and ureter, unspecified: Secondary | ICD-10-CM

## 2021-05-20 DIAGNOSIS — R55 Syncope and collapse: Secondary | ICD-10-CM

## 2021-05-20 DIAGNOSIS — R079 Chest pain, unspecified: Secondary | ICD-10-CM

## 2021-05-20 DIAGNOSIS — R0602 Shortness of breath: Secondary | ICD-10-CM | POA: Diagnosis not present

## 2021-05-20 DIAGNOSIS — R7989 Other specified abnormal findings of blood chemistry: Secondary | ICD-10-CM | POA: Diagnosis not present

## 2021-05-20 LAB — COMPREHENSIVE METABOLIC PANEL
ALT: 15 U/L (ref 0–35)
AST: 16 U/L (ref 0–37)
Albumin: 4.2 g/dL (ref 3.5–5.2)
Alkaline Phosphatase: 64 U/L (ref 39–117)
BUN: 26 mg/dL — ABNORMAL HIGH (ref 6–23)
CO2: 30 mEq/L (ref 19–32)
Calcium: 10 mg/dL (ref 8.4–10.5)
Chloride: 103 mEq/L (ref 96–112)
Creatinine, Ser: 1.27 mg/dL — ABNORMAL HIGH (ref 0.40–1.20)
GFR: 41.95 mL/min — ABNORMAL LOW (ref >60.00)
Glucose, Bld: 117 mg/dL — ABNORMAL HIGH (ref 70–99)
Potassium: 4 mEq/L (ref 3.5–5.1)
Sodium: 138 mEq/L (ref 135–145)
Total Bilirubin: 0.4 mg/dL (ref 0.2–1.2)
Total Protein: 6.9 g/dL (ref 6.0–8.3)

## 2021-05-20 LAB — CBC
HCT: 38.7 % (ref 36.0–46.0)
Hemoglobin: 12.8 g/dL (ref 12.0–15.0)
MCHC: 33.1 g/dL (ref 30.0–36.0)
MCV: 80.7 fl (ref 78.0–100.0)
Platelets: 193 10*3/uL (ref 150.0–400.0)
RBC: 4.79 Mil/uL (ref 3.87–5.11)
RDW: 13.1 % (ref 11.5–15.5)
WBC: 9.7 10*3/uL (ref 4.0–10.5)

## 2021-05-20 LAB — TROPONIN I (HIGH SENSITIVITY): High Sens Troponin I: 7 ng/L (ref 2–17)

## 2021-05-20 LAB — PHOSPHORUS: Phosphorus: 3.7 mg/dL (ref 2.3–4.6)

## 2021-05-20 LAB — D-DIMER, QUANTITATIVE: D-Dimer, Quant: 1.24 mcg/mL FEU — ABNORMAL HIGH (ref <0.50)

## 2021-05-20 NOTE — Patient Instructions (Addendum)
It was good to see you today- take care and I will be in touch with your labs asap  If your D dimer is high we will plan for a CT scan of your chest  Let me know if any chest pain at all  If we don't end up doing a CT scan of your chest we may want to do a plain chest x-ray

## 2021-05-22 ENCOUNTER — Ambulatory Visit (HOSPITAL_BASED_OUTPATIENT_CLINIC_OR_DEPARTMENT_OTHER)
Admission: RE | Admit: 2021-05-22 | Discharge: 2021-05-22 | Disposition: A | Discharge status: Home / Self Care | Payer: Medicare Other | Source: Ambulatory Visit | Attending: Family Medicine | Admitting: Family Medicine

## 2021-05-22 ENCOUNTER — Telehealth: Payer: Self-pay | Admitting: Family Medicine

## 2021-05-22 ENCOUNTER — Other Ambulatory Visit: Payer: Self-pay

## 2021-05-22 DIAGNOSIS — I2694 Multiple subsegmental pulmonary emboli without acute cor pulmonale: Secondary | ICD-10-CM | POA: Diagnosis not present

## 2021-05-22 DIAGNOSIS — R0602 Shortness of breath: Secondary | ICD-10-CM | POA: Diagnosis not present

## 2021-05-22 DIAGNOSIS — R079 Chest pain, unspecified: Secondary | ICD-10-CM | POA: Diagnosis not present

## 2021-05-22 DIAGNOSIS — Q453 Other congenital malformations of pancreas and pancreatic duct: Secondary | ICD-10-CM

## 2021-05-22 DIAGNOSIS — I2699 Other pulmonary embolism without acute cor pulmonale: Secondary | ICD-10-CM | POA: Diagnosis not present

## 2021-05-22 DIAGNOSIS — R7989 Other specified abnormal findings of blood chemistry: Secondary | ICD-10-CM | POA: Insufficient documentation

## 2021-05-22 MED ORDER — IOHEXOL 350 MG/ML SOLN
75.0000 mL | Freq: Once | INTRAVENOUS | Status: AC | PRN
  Administered 2021-05-22: 75 mL via INTRAVENOUS

## 2021-05-22 MED ORDER — RIVAROXABAN (XARELTO) VTE STARTER PACK (15 & 20 MG): ORAL_TABLET | ORAL | 0 refills | Status: DC

## 2021-05-22 NOTE — Telephone Encounter (Signed)
Received call from radiology regarding patient's CT angiogram She has positive pulmonary embolism and does have possible pancreatic abnormality as well  Patient was seen by myself for shortness of breath on February 1, which led to this evaluation, positive D-dimer and subsequent PE diagnosis  Most recent GFR 41.9  Called patient to discuss.  She is stable, gets short of breath with exertion but is not in distress.  We will have her start on Xarelto starter pack today  Referral to hematology to evaluate for any clotting disorder.  This is her first blood clot I also advised her about concern for pancreatic abnormality.  We will order a CT abdomen pelvis I scheduled her appointment to see me in 1 week for follow-up, can do pancreatic enzymes at that time  She is advised to seek care at the ER if any worsening shortness of breath, certainly if any distress.  She is also asked to contact me if any unusual bleeding or bruising    Meds ordered this encounter  Medications   RIVAROXABAN (XARELTO) VTE STARTER PACK (15 & 20 MG)    Sig: Follow package directions: Take one 15mg  tablet by mouth twice a day. On day 22, switch to one 20mg  tablet once a day. Take with food.    Dispense:  51 each    Refill:  0       CT Angio Chest W/Cm &/Or Wo Cm  Result Date: 05/22/2021 CLINICAL DATA:  Shortness of breath. Positive D-dimer. Chest pain, unspecified type. EXAM: CT ANGIOGRAPHY CHEST WITH CONTRAST TECHNIQUE: Multidetector CT imaging of the chest was performed using the standard protocol during bolus administration of intravenous contrast. Multiplanar CT image reconstructions and MIPs were obtained to evaluate the vascular anatomy. RADIATION DOSE REDUCTION: This exam was performed according to the departmental dose-optimization program which includes automated exposure control, adjustment of the mA and/or kV according to patient size and/or use of iterative reconstruction technique. CONTRAST:  82mL  OMNIPAQUE IOHEXOL 350 MG/ML SOLN COMPARISON:  Chest radiograph 08/21/2020 FINDINGS: Cardiovascular: Positive for pulmonary embolism in right lower lobe pulmonary artery subsegmental branches on the sequence 5 image 140. There is also a small amount of thrombus in the left lower lobe at the level of the segmental branches on sequence 5 image 130. Overall, the clot burden is small. Normal caliber of the thoracic aorta with atherosclerotic calcifications in the descending thoracic aorta. Celiac trunk and SMA are patent. Mild narrowing near the origin of the SMA. No evidence to suggest heart strain. Heart size is within normal limits. Small amount of pericardial fluid. Mediastinum/Nodes: Thyroid tissue is heterogeneous and evidence for small nodules. Largest nodule measures approximately 1.3 cm. No follow-up imaging is recommended. Reference: J Am Coll Radiol. 2015 Feb;12(2): 143-50. Multiple calcifications in the upper mediastinum. Findings are suggestive for old granulomatous disease. No significant lymph node enlargement in the mediastinum, hila or axillary regions. Lungs/Pleura: Hazy and patchy densities along the posterior right lower lobe are concerning for areas infarct based on the pulmonary embolism in this area. No significant pleural effusions. Upper Abdomen: Heterogeneous appearance of the pancreas particularly in the body and tail regions. 6 mm hypodensity in the anterior liver is probably an incidental finding. Normal appearance of the gallbladder. No significant biliary dilatation. Musculoskeletal: No acute bone abnormality. Surgical plate in the lower cervical spine is partially imaged. Review of the MIP images confirms the above findings. IMPRESSION: 1. Positive for bilateral pulmonary embolism in bilateral lower lobe pulmonary arteries. Evidence for segmental and  subsegmental clot. Overall, clot burden is small. Patchy densities along the posterior right lower lobe are suggestive for atelectasis and  infarct associated with the pulmonary embolism. 2. Pancreas has a very heterogeneous appearance. Findings are nonspecific and difficult to assess due to the arterial phase of contrast. Recommend correlation with amylase and lipase labs and recommend a dedicated abdominal CT to further evaluate the pancreas. Critical Value/emergent results were called by telephone at the time of interpretation on 05/22/2021 at 2:07 pm to provider [facility] , who verbally acknowledged these results. Electronically Signed   By: Markus Daft M.D.   On: 05/22/2021 14:07

## 2021-05-24 NOTE — Progress Notes (Signed)
[place] [facility] [address] [place], [place] [address] [phone]  Date:  05/27/2021   Name:  Monica Kane   DOB:  [birth year]   MRN:  [id number]  PCP:  Darreld Mclean, MD    Chief Complaint: Follow-up (Seen 05/20/21- CT confirmed PE/Concerns/ questions: none/)   History of Present Illness:  Monica Kane is a 76 y.o. very pleasant female patient who presents with the following:  History of hypertension, prediabetes hyperlipidemia, sleep apnea treated with oral appliance, vitamin D deficiency Patient seen today for follow-up of recently diagnosed pulmonary embolism She was seen by myself February 1 with concern of shortness of breath and a presyncopal episode I seen her previously on January 16 for upper respiratory infection symptoms.  She tested negative for COVID-19 as well as RSV  At her last visit she had noted shortness of breath with any exertion for about a month. EKG appeared benign, troponin negative.  D-dimer was positive, this led to a CT angiogram being performed on February 3-positive for pulmonary embolism  Patient started on Xarelto starter pack the same day.  This is her first pulmonary embolism Also, CT report mentions some concern of pancreatic heterogeneity We will check amylase and lipase today, CT abdomen pelvis already ordered  Referral also made to hematology to help determine ideal duration of treatment.  Patient had been ill recently, certainly she may have had undiagnosed COVID-19 which led to this pulmonary embolism.  Would also like evaluation for any thromboembolic disorder.  Pt notes she is already feeling better!   She is still a bit breathless but already better, her exercise tolerance is improving. No hemoptysis We discussed her PE and pancreatic findings and our plans going forward   Pt seen by Dr Marin Olp earlier today: It is hard to say why she had this.  Again, I have to wonder  if she did not have COVID.  I again I know she tested negative for this. We really need to get Dopplers of her legs to see if there is a thromboembolic disease in her legs that could have broke off and went to her lungs. We will see what the hypercoagulable studies show.  I am sure she will test positive for the lupus anticoagulant.  It seems as if everybody has this now. She is on Xarelto.  I would keep her on full dose Xarelto for a year and then transition over to maintenance dose Xarelto for 1 year.   Patient Active Problem List   Diagnosis Date Noted   Acquired claw toe, right    Achilles tendon contracture, right    OSA (obstructive sleep apnea) 07/26/2019   Vertigo 10/03/2018   Visual blurriness 10/03/2018   Controlled type 2 diabetes mellitus without complication, without long-term current use of insulin ([place]) 06/05/2015   Primary localized osteoarthritis of right knee    Anxiety    Chest pain, midsternal 07/23/2013   Osteopenia 03/23/2013   Kidney stones, calcium oxalate 03/23/2013   Hypertension 03/31/2011   Hyperlipidemia 03/31/2011   Vitamin D deficiency 03/31/2011   Hallux rigidus 03/24/2011   Metatarsalgia of left foot 03/24/2011   Lateral epicondylitis of left elbow 02/24/2011   Migraine 02/24/2011   Depression 02/24/2011   Arthritis 02/24/2011    Past Medical History:  Diagnosis Date   Anxiety    takes Ativan nightly   Depression    takes Zoloft daily   History of  colon polyps    History of kidney stones    History of staph infection 11/2013   Hyperlipidemia    takes Crestor nightly    Hypertension    takes Lisinopril and Coreg daily   Joint pain    Joint swelling    Neuropathy    takes Elavil nightly   Pneumonia    Pre-diabetes    Primary localized osteoarthritis of right knee    Sleep apnea    uses oral appliance    Past Surgical History:  Procedure Laterality Date   ABDOMINAL HYSTERECTOMY  1989   APPENDECTOMY     CARDIAC CATHETERIZATION  2015    CERVICAL SPINE SURGERY  2004   C 5-6   COLONOSCOPY     colonscopy     DIAGNOSTIC LAPAROSCOPY  1976   fibroid removed   ELBOW ARTHROSCOPY  2010, 2012   EYE SURGERY Bilateral    cataract   FOOT SURGERY Left 2013   GASTROCNEMIUS RECESSION Right 10/09/2019   Procedure: RIGHT GASTROCNEMIUS RECESSION;  Surgeon: Newt Minion, MD;  Location: [place];  Service: Orthopedics;  Laterality: Right;   KNEE ARTHROSCOPY  Oct 2014   KNEE ARTHROSCOPY W/ MENISCECTOMY Right 01/31/2013   [place]   LATERAL EPICONDYLE RELEASE  03/01/2011   Procedure: TENNIS ELBOW RELEASE;  Surgeon: Lorn Junes, MD;  Location: [place];  Service: Orthopedics;  Laterality: Left;  TENOTOMY ELBOW LATERAL EPICONDYLITIS TENNIS ELBOW   lateral release right elbow  2010   left foot surgery  2013   mortons neuroma   LEFT HEART CATHETERIZATION WITH CORONARY ANGIOGRAM N/A 07/23/2013   Procedure: LEFT HEART CATHETERIZATION WITH CORONARY ANGIOGRAM;  Surgeon: Sinclair Grooms, MD;  Location: [facility] CATH LAB;  Service: Cardiovascular;  Laterality: N/A;   PAROTID GLAND TUMOR EXCISION  1976   benign   PAROTID GLAND TUMOR EXCISION  1976   rt   SHOULDER ARTHROSCOPY DISTAL CLAVICLE EXCISION AND OPEN ROTATOR CUFF REPAIR  2006   SHOULDER ARTHROSCOPY W/ ROTATOR CUFF REPAIR  2006   TOTAL KNEE ARTHROPLASTY Right 02/18/2014   Procedure: TOTAL KNEE ARTHROPLASTY;  Surgeon: Lorn Junes, MD;  Location: [place];  Service: Orthopedics;  Laterality: Right;   WEIL OSTEOTOMY Right 10/09/2019   Procedure: WEIL OSTEOTOMY RIGHT 2ND METATARSAL;  Surgeon: Newt Minion, MD;  Location: [place];  Service: Orthopedics;  Laterality: Right;    Social History   Tobacco Use   Smoking status: Never   Smokeless tobacco: Never  Vaping Use   Vaping Use: Never used  Substance Use Topics   Alcohol use: Yes    Comment: social   Drug use: No    Family History  Problem Relation Age of Onset   Cancer Mother         lung   Cancer Father        lung; + tobacco   Heart disease Maternal Grandfather        heart attack   Heart disease Paternal Grandfather        heart attack   Diabetes Sister    Hypertension Sister    Hyperlipidemia Sister    Breast cancer Neg Hx     Allergies  Allergen Reactions   Penicillins Swelling and Rash    Facial swelling    Medication list has been reviewed and updated.  Current Outpatient Medications on File Prior to Visit  Medication Sig Dispense Refill   amitriptyline (ELAVIL) 25 MG tablet  TAKE ONE TABLET BY MOUTH AT BEDTIME. 90 tablet 1   carvedilol (COREG) 3.125 MG tablet TAKE 1 TABLET BY MOUTH TWICE DAILY WITH A MEAL. (Patient taking differently: at bedtime.) 180 tablet 2   lisinopril (ZESTRIL) 40 MG tablet Take 1 tablet (40 mg total) by mouth daily. 90 tablet 3   metFORMIN (GLUCOPHAGE) 500 MG tablet Take 1 tablet (500 mg total) by mouth daily. May start with a 1/2 tablet and increase as tolerated 90 tablet 3   RIVAROXABAN (XARELTO) VTE STARTER PACK (15 & 20 MG) Follow package directions: Take one 15mg  tablet by mouth twice a day. On day 22, switch to one 20mg  tablet once a day. Take with food. 51 each 0   rosuvastatin (CRESTOR) 10 MG tablet TAKE ONE TABLET BY MOUTH DAILY 30 tablet 0   sertraline (ZOLOFT) 100 MG tablet Take 2.5 tablets (250 mg total) by mouth daily. 225 tablet 3   traZODone (DESYREL) 50 MG tablet TAKE 1 OR 2 TABLETS AT BEDTIME. 180 tablet 1   No current facility-administered medications on file prior to visit.    Review of Systems:  As per HPI- otherwise negative.   Physical Examination: Vitals:   05/27/21 1258  BP: 122/60  Pulse: 74  Resp: 18  Temp: 98.2 F (36.8 C)  SpO2: 98%   Vitals:   05/27/21 1258  Weight: 162 lb 9.6 oz (73.8 kg)  Height: 5\' 5"  (1.651 m)   Body mass index is 27.06 kg/m. Ideal Body Weight: Weight in (lb) to have BMI = 25: 149.9  GEN: no acute distress.  Mild overweight, looks well  HEENT:  Atraumatic, Normocephalic.  Ears and Nose: No external deformity. CV: RRR, No M/G/R. No JVD. No thrill. No extra heart sounds. PULM: CTA B, no wheezes, crackles, rhonchi. No retractions. No resp. distress. No accessory muscle use. EXTR: No c/c/e PSYCH: Normally interactive. Conversant.     Assessment and Plan: Atrophy, pancreas - Plan: Amylase, Lipase  Pulmonary embolism and infarction (HCC)  Pt following up today- PE is being treated with anticoagulation with improvement She is getting her CT abd/ pelvis and LE doppler tomorrow Will plan further follow- up pending labs.   Signed Lamar Blinks, MD

## 2021-05-26 DIAGNOSIS — M5416 Radiculopathy, lumbar region: Secondary | ICD-10-CM | POA: Diagnosis not present

## 2021-05-27 ENCOUNTER — Other Ambulatory Visit: Payer: Self-pay

## 2021-05-27 ENCOUNTER — Encounter: Payer: Self-pay | Admitting: Hematology & Oncology

## 2021-05-27 ENCOUNTER — Other Ambulatory Visit: Payer: Self-pay | Admitting: *Deleted

## 2021-05-27 ENCOUNTER — Inpatient Hospital Stay: Payer: Medicare Other | Attending: Hematology & Oncology

## 2021-05-27 ENCOUNTER — Ambulatory Visit (INDEPENDENT_AMBULATORY_CARE_PROVIDER_SITE_OTHER): Payer: Medicare Other | Admitting: Family Medicine

## 2021-05-27 ENCOUNTER — Inpatient Hospital Stay (HOSPITAL_BASED_OUTPATIENT_CLINIC_OR_DEPARTMENT_OTHER): Payer: Medicare Other | Admitting: Hematology & Oncology

## 2021-05-27 VITALS — BP 122/60 | HR 74 | Temp 98.2°F | Resp 18 | Ht 65.0 in | Wt 162.6 lb

## 2021-05-27 VITALS — BP 123/64 | HR 64 | Temp 98.4°F | Resp 18 | Ht 65.0 in | Wt 163.1 lb

## 2021-05-27 DIAGNOSIS — Z7901 Long term (current) use of anticoagulants: Secondary | ICD-10-CM | POA: Insufficient documentation

## 2021-05-27 DIAGNOSIS — K8689 Other specified diseases of pancreas: Secondary | ICD-10-CM

## 2021-05-27 DIAGNOSIS — R7303 Prediabetes: Secondary | ICD-10-CM | POA: Diagnosis not present

## 2021-05-27 DIAGNOSIS — I6312 Cerebral infarction due to embolism of basilar artery: Secondary | ICD-10-CM

## 2021-05-27 DIAGNOSIS — Z801 Family history of malignant neoplasm of trachea, bronchus and lung: Secondary | ICD-10-CM | POA: Insufficient documentation

## 2021-05-27 DIAGNOSIS — I2699 Other pulmonary embolism without acute cor pulmonale: Secondary | ICD-10-CM

## 2021-05-27 DIAGNOSIS — Z7984 Long term (current) use of oral hypoglycemic drugs: Secondary | ICD-10-CM | POA: Diagnosis not present

## 2021-05-27 DIAGNOSIS — I2601 Septic pulmonary embolism with acute cor pulmonale: Secondary | ICD-10-CM

## 2021-05-27 DIAGNOSIS — Z90710 Acquired absence of both cervix and uterus: Secondary | ICD-10-CM | POA: Insufficient documentation

## 2021-05-27 DIAGNOSIS — I10 Essential (primary) hypertension: Secondary | ICD-10-CM | POA: Insufficient documentation

## 2021-05-27 DIAGNOSIS — I63433 Cerebral infarction due to embolism of bilateral posterior cerebral arteries: Secondary | ICD-10-CM

## 2021-05-27 DIAGNOSIS — I63423 Cerebral infarction due to embolism of bilateral anterior cerebral arteries: Secondary | ICD-10-CM

## 2021-05-27 LAB — CBC WITH DIFFERENTIAL (CANCER CENTER ONLY)
Abs Immature Granulocytes: 0.02 10*3/uL (ref 0.00–0.07)
Basophils Absolute: 0.1 10*3/uL (ref 0.0–0.1)
Basophils Relative: 1 %
Eosinophils Absolute: 0.1 10*3/uL (ref 0.0–0.5)
Eosinophils Relative: 2 %
HCT: 38.6 % (ref 36.0–46.0)
Hemoglobin: 12.3 g/dL (ref 12.0–15.0)
Immature Granulocytes: 0 %
Lymphocytes Relative: 24 %
Lymphs Abs: 1.2 10*3/uL (ref 0.7–4.0)
MCH: 26.3 pg (ref 26.0–34.0)
MCHC: 31.9 g/dL (ref 30.0–36.0)
MCV: 82.5 fL (ref 80.0–100.0)
Monocytes Absolute: 0.3 10*3/uL (ref 0.1–1.0)
Monocytes Relative: 5 %
Neutro Abs: 3.5 10*3/uL (ref 1.7–7.7)
Neutrophils Relative %: 68 %
Platelet Count: 252 10*3/uL (ref 150–400)
RBC: 4.68 MIL/uL (ref 3.87–5.11)
RDW: 12.6 % (ref 11.5–15.5)
WBC Count: 5.2 10*3/uL (ref 4.0–10.5)
nRBC: 0 % (ref 0.0–0.2)

## 2021-05-27 LAB — CMP (CANCER CENTER ONLY)
ALT: 14 U/L (ref 0–44)
AST: 14 U/L — ABNORMAL LOW (ref 15–41)
Albumin: 4.1 g/dL (ref 3.5–5.0)
Alkaline Phosphatase: 67 U/L (ref 38–126)
Anion gap: 9 (ref 5–15)
BUN: 12 mg/dL (ref 8–23)
CO2: 26 mmol/L (ref 22–32)
Calcium: 9.5 mg/dL (ref 8.9–10.3)
Chloride: 106 mmol/L (ref 98–111)
Creatinine: 0.93 mg/dL (ref 0.44–1.00)
GFR, Estimated: >60 mL/min (ref >60)
Glucose, Bld: 118 mg/dL — ABNORMAL HIGH (ref 70–99)
Potassium: 3.7 mmol/L (ref 3.5–5.1)
Sodium: 141 mmol/L (ref 135–145)
Total Bilirubin: 0.4 mg/dL (ref 0.3–1.2)
Total Protein: 6.7 g/dL (ref 6.5–8.1)

## 2021-05-27 LAB — LIPASE, BLOOD: Lipase: 41 U/L (ref 11–51)

## 2021-05-27 LAB — ANTITHROMBIN III: AntiThromb III Func: 122 % — ABNORMAL HIGH (ref 75–120)

## 2021-05-27 LAB — AMYLASE: Amylase: 42 U/L (ref 28–100)

## 2021-05-27 NOTE — Progress Notes (Signed)
Referral MD  Reason for Referral: Bilateral pulmonary embolism.  Chief Complaint  Patient presents with   New Patient (Initial Visit)  : I have blood clots in the lung.  HPI: Monica Kane is a very charming 76 year old white female.  She certainly looks a lot younger.  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  It was somewhat fun talking to her about ballroom dancing.  She has been healthy.  However, right around Thanksgiving, she had this "cold" which lasted over a month.  She said that she tested negative many times for COVID.  She was not all that active.  However, she really was not bedbound.  She never had any leg swelling.  She said that on occasion there was some pain in her right leg.  She had had right knee surgery back in 2015.  She then became short of breath.  This was with exertion.  She had no cough.  She had no fever.  She had some chest wall pain.  She subsequently saw Dr. Lorelei Pont who, as expected, was very thorough with her evaluation.  A D-dimer was done.  This was positive.  A CT angiogram was then done.  Unfortunately, this was also positive for bilateral pulmonary emboli.  The CT angiogram was done on May 22, 2021 she has segmental and so segmental clot burden.  The clot burden was small.  However, also noted was a heterogeneous appearing pancreas.  I am not sure what this means but regardless, Monica Kane will be having a CT of the abdomen pelvis tomorrow.  She has been on Xarelto.  She said that she feels better with less shortness of breath.  She has had no change in bowel or bladder habits.  She does not note any weight loss or weight gain.  She does not smoke.  She not been on any, or multiple agents.  She is up-to-date with her mammograms and with her colonoscopy.  She has had past surgeries without any difficulty..  She does have what would be considered prediabetes.  She is on metformin for this.  There is no history of blood clots  in the family.  She has never been pregnant.  She has never had miscarriages.  Overall, her performance status is ECOG 0.    Past Medical History:  Diagnosis Date   Anxiety    takes Ativan nightly   Depression    takes Zoloft daily   History of colon polyps    History of kidney stones    History of staph infection 11/2013   Hyperlipidemia    takes Crestor nightly    Hypertension    takes Lisinopril and Coreg daily   Joint pain    Joint swelling    Neuropathy    takes Elavil nightly   Pneumonia    Pre-diabetes    Primary localized osteoarthritis of right knee    Sleep apnea    uses oral appliance  :   Past Surgical History:  Procedure Laterality Date   ABDOMINAL HYSTERECTOMY  1989   APPENDECTOMY     CARDIAC CATHETERIZATION  2015   CERVICAL SPINE SURGERY  2004   C 5-6   COLONOSCOPY     colonscopy     DIAGNOSTIC LAPAROSCOPY  1976   fibroid removed   ELBOW ARTHROSCOPY  2010, 2012   EYE SURGERY Bilateral    cataract   FOOT SURGERY Left 2013   GASTROCNEMIUS RECESSION Right 10/09/2019   Procedure:  RIGHT GASTROCNEMIUS RECESSION;  Surgeon: Newt Minion, MD;  Location: [place];  Service: Orthopedics;  Laterality: Right;   KNEE ARTHROSCOPY  Oct 2014   KNEE ARTHROSCOPY W/ MENISCECTOMY Right 01/31/2013   [place]   LATERAL EPICONDYLE RELEASE  03/01/2011   Procedure: TENNIS ELBOW RELEASE;  Surgeon: Lorn Junes, MD;  Location: [place];  Service: Orthopedics;  Laterality: Left;  TENOTOMY ELBOW LATERAL EPICONDYLITIS TENNIS ELBOW   lateral release right elbow  2010   left foot surgery  2013   mortons neuroma   LEFT HEART CATHETERIZATION WITH CORONARY ANGIOGRAM N/A 07/23/2013   Procedure: LEFT HEART CATHETERIZATION WITH CORONARY ANGIOGRAM;  Surgeon: Sinclair Grooms, MD;  Location: [facility] CATH LAB;  Service: Cardiovascular;  Laterality: N/A;   PAROTID GLAND TUMOR EXCISION  1976   benign   PAROTID GLAND TUMOR EXCISION  1976   rt   SHOULDER  ARTHROSCOPY DISTAL CLAVICLE EXCISION AND OPEN ROTATOR CUFF REPAIR  2006   SHOULDER ARTHROSCOPY W/ ROTATOR CUFF REPAIR  2006   TOTAL KNEE ARTHROPLASTY Right 02/18/2014   Procedure: TOTAL KNEE ARTHROPLASTY;  Surgeon: Lorn Junes, MD;  Location: [place];  Service: Orthopedics;  Laterality: Right;   WEIL OSTEOTOMY Right 10/09/2019   Procedure: WEIL OSTEOTOMY RIGHT 2ND METATARSAL;  Surgeon: Newt Minion, MD;  Location: [place];  Service: Orthopedics;  Laterality: Right;  :   Current Outpatient Medications:    amitriptyline (ELAVIL) 25 MG tablet, TAKE ONE TABLET BY MOUTH AT BEDTIME., Disp: 90 tablet, Rfl: 1   carvedilol (COREG) 3.125 MG tablet, TAKE 1 TABLET BY MOUTH TWICE DAILY WITH A MEAL. (Patient taking differently: at bedtime.), Disp: 180 tablet, Rfl: 2   lisinopril (ZESTRIL) 40 MG tablet, Take 1 tablet (40 mg total) by mouth daily., Disp: 90 tablet, Rfl: 3   metFORMIN (GLUCOPHAGE) 500 MG tablet, Take 1 tablet (500 mg total) by mouth daily. May start with a 1/2 tablet and increase as tolerated, Disp: 90 tablet, Rfl: 3   RIVAROXABAN (XARELTO) VTE STARTER PACK (15 & 20 MG), Follow package directions: Take one 15mg  tablet by mouth twice a day. On day 22, switch to one 20mg  tablet once a day. Take with food., Disp: 51 each, Rfl: 0   rosuvastatin (CRESTOR) 10 MG tablet, TAKE ONE TABLET BY MOUTH DAILY, Disp: 30 tablet, Rfl: 0   sertraline (ZOLOFT) 100 MG tablet, Take 2.5 tablets (250 mg total) by mouth daily., Disp: 225 tablet, Rfl: 3   traZODone (DESYREL) 50 MG tablet, TAKE 1 OR 2 TABLETS AT BEDTIME., Disp: 180 tablet, Rfl: 1:  :   Allergies  Allergen Reactions   Penicillins Swelling and Rash    Facial swelling  :   Family History  Problem Relation Age of Onset   Cancer Mother        lung   Cancer Father        lung; + tobacco   Heart disease Maternal Grandfather        heart attack   Heart disease Paternal Grandfather        heart attack   Diabetes Sister     Hypertension Sister    Hyperlipidemia Sister    Breast cancer Neg Hx   :   Social History   Socioeconomic History   Marital status: Married    Spouse name: Antaniya Venuti   Number of children: Not on file   Years of education: Not on file   Highest education level:  Not on file  Occupational History   Not on file  Tobacco Use   Smoking status: Never   Smokeless tobacco: Never  Vaping Use   Vaping Use: Never used  Substance and Sexual Activity   Alcohol use: Yes    Comment: social   Drug use: No   Sexual activity: Yes    Birth control/protection: Surgical  Other Topics Concern   Not on file  Social History Narrative   Lives with her husband.  He has 2 adult children. Patient has a Financial risk analyst, and she does exercise.   Social Determinants of Health   Financial Resource Strain: Not on file  Food Insecurity: Not on file  Transportation Needs: Not on file  Physical Activity: Not on file  Stress: Not on file  Social Connections: Not on file  Intimate Partner Violence: Not on file  :  Review of Systems  Constitutional: Negative.   HENT: Negative.    Eyes: Negative.   Respiratory:  Positive for shortness of breath.   Cardiovascular: Negative.   Gastrointestinal: Negative.   Genitourinary: Negative.   Musculoskeletal:  Positive for joint pain and myalgias.  Skin: Negative.   Neurological: Negative.   Endo/Heme/Allergies: Negative.   Psychiatric/Behavioral: Negative.      Exam: @IPVITALS @ Physical Exam Vitals reviewed.  HENT:     Head: Normocephalic and atraumatic.  Eyes:     Pupils: Pupils are equal, round, and reactive to light.  Cardiovascular:     Rate and Rhythm: Normal rate and regular rhythm.     Heart sounds: Normal heart sounds.  Pulmonary:     Effort: Pulmonary effort is normal.     Breath sounds: Normal breath sounds.  Abdominal:     General: Bowel sounds are normal.     Palpations: Abdomen is soft.  Musculoskeletal:        General: No  tenderness or deformity. Normal range of motion.     Cervical back: Normal range of motion.  Lymphadenopathy:     Cervical: No cervical adenopathy.  Skin:    General: Skin is warm and dry.     Findings: No erythema or rash.  Neurological:     Mental Status: She is alert and oriented to person, place, and time.  Psychiatric:        Behavior: Behavior normal.        Thought Content: Thought content normal.        Judgment: Judgment normal.    Recent Labs    05/27/21 1035  WBC 5.2  HGB 12.3  HCT 38.6  PLT 252    Recent Labs    05/27/21 1035  NA 141  K 3.7  CL 106  CO2 26  GLUCOSE 118*  BUN 12  CREATININE 0.93  CALCIUM 9.5    Blood smear review: None  Pathology: None    Assessment and Plan: Monica Kane is a very charming 76 year old white female.  She has bilateral pulmonary emboli.  Thankfully, there is no significant clot burden.  It is hard to say why she had this.  Again, I have to wonder if she did not have COVID.  I again I know she tested negative for this.  We really need to get Dopplers of her legs to see if there is a thromboembolic disease in her legs that could have broke off and went to her lungs.  We will see what the hypercoagulable studies show.  I am sure she will test positive for the lupus anticoagulant.  It seems as if everybody has this now.  She is on Xarelto.  I would keep her on full dose Xarelto for a year and then transition over to maintenance dose Xarelto for 1 year.  I told her that she could certainly do what she would like right now.  She is on blood thinner.  I told her that she must stay hydrated.  I think this will be very helpful.  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  We talked about this.  I have also "dabbled" in ballroom dancing with my wife.  It is truly a sport and the answers are athletes.  I would like to have her come back to see [place] in another 6 weeks.  We will see about the Doppler of her  legs and try to get this in 2 days.

## 2021-05-28 ENCOUNTER — Encounter (HOSPITAL_BASED_OUTPATIENT_CLINIC_OR_DEPARTMENT_OTHER): Payer: Self-pay

## 2021-05-28 ENCOUNTER — Ambulatory Visit (HOSPITAL_BASED_OUTPATIENT_CLINIC_OR_DEPARTMENT_OTHER)
Admission: RE | Admit: 2021-05-28 | Discharge: 2021-05-28 | Disposition: A | Discharge status: Home / Self Care | Payer: Medicare Other | Source: Ambulatory Visit | Attending: Family Medicine | Admitting: Family Medicine

## 2021-05-28 DIAGNOSIS — Q453 Other congenital malformations of pancreas and pancreatic duct: Secondary | ICD-10-CM | POA: Insufficient documentation

## 2021-05-28 DIAGNOSIS — R109 Unspecified abdominal pain: Secondary | ICD-10-CM | POA: Diagnosis not present

## 2021-05-28 LAB — PROTEIN S, TOTAL: Protein S Ag, Total: 110 % (ref 60–150)

## 2021-05-28 LAB — PROTEIN S ACTIVITY: Protein S Activity: 120 % (ref 63–140)

## 2021-05-28 LAB — LUPUS ANTICOAGULANT PANEL
DRVVT: 83.7 s — ABNORMAL HIGH (ref 0.0–47.0)
PTT Lupus Anticoagulant: 36.2 s (ref 0.0–43.5)

## 2021-05-28 LAB — DRVVT CONFIRM: dRVVT Confirm: 1.2 ratio (ref 0.8–1.2)

## 2021-05-28 LAB — HOMOCYSTEINE: Homocysteine: 17.1 umol/L (ref 0.0–19.2)

## 2021-05-28 LAB — DRVVT MIX: dRVVT Mix: 70.6 s — ABNORMAL HIGH (ref 0.0–40.4)

## 2021-05-28 LAB — PROTEIN C ACTIVITY: Protein C Activity: 174 % (ref 73–180)

## 2021-05-28 MED ORDER — IOHEXOL 300 MG/ML  SOLN
100.0000 mL | Freq: Once | INTRAMUSCULAR | Status: AC | PRN
  Administered 2021-05-28: 100 mL via INTRAVENOUS

## 2021-05-29 LAB — CARDIOLIPIN ANTIBODIES, IGG, IGM, IGA
Anticardiolipin IgA: <9 APL U/mL (ref 0–11)
Anticardiolipin IgG: <9 GPL U/mL (ref 0–14)
Anticardiolipin IgM: <9 MPL U/mL (ref 0–12)

## 2021-05-29 LAB — PROTEIN C, TOTAL: Protein C, Total: 155 % — ABNORMAL HIGH (ref 60–150)

## 2021-05-29 LAB — BETA-2-GLYCOPROTEIN I ABS, IGG/M/A
Beta-2 Glyco I IgG: <9 GPI IgG units (ref 0–20)
Beta-2-Glycoprotein I IgA: <9 GPI IgA units (ref 0–25)
Beta-2-Glycoprotein I IgM: <9 GPI IgM units (ref 0–32)

## 2021-05-30 ENCOUNTER — Ambulatory Visit (HOSPITAL_BASED_OUTPATIENT_CLINIC_OR_DEPARTMENT_OTHER)
Admission: RE | Admit: 2021-05-30 | Discharge: 2021-05-30 | Disposition: A | Discharge status: Home / Self Care | Payer: Medicare Other | Source: Ambulatory Visit | Attending: Hematology & Oncology | Admitting: Hematology & Oncology

## 2021-05-30 ENCOUNTER — Other Ambulatory Visit: Payer: Self-pay

## 2021-05-30 DIAGNOSIS — Z86711 Personal history of pulmonary embolism: Secondary | ICD-10-CM | POA: Diagnosis not present

## 2021-05-30 DIAGNOSIS — M25461 Effusion, right knee: Secondary | ICD-10-CM | POA: Diagnosis not present

## 2021-05-30 DIAGNOSIS — M79604 Pain in right leg: Secondary | ICD-10-CM | POA: Diagnosis not present

## 2021-05-30 DIAGNOSIS — I2601 Septic pulmonary embolism with acute cor pulmonale: Secondary | ICD-10-CM

## 2021-06-01 ENCOUNTER — Telehealth: Payer: Self-pay | Admitting: *Deleted

## 2021-06-01 ENCOUNTER — Encounter: Payer: Self-pay | Admitting: Family Medicine

## 2021-06-01 ENCOUNTER — Telehealth: Payer: Self-pay | Admitting: Family Medicine

## 2021-06-01 ENCOUNTER — Encounter: Payer: Self-pay | Admitting: *Deleted

## 2021-06-01 DIAGNOSIS — K8689 Other specified diseases of pancreas: Secondary | ICD-10-CM

## 2021-06-01 LAB — FACTOR 5 LEIDEN

## 2021-06-01 LAB — PROTHROMBIN GENE MUTATION

## 2021-06-01 NOTE — Telephone Encounter (Signed)
Looks like these will be duplicate labs- please confirm.

## 2021-06-01 NOTE — Telephone Encounter (Signed)
Pt has lab appointment on 06/04/21.  The only future order I see is from 05/20/21 for a bmet.  Pt had a bmet on 2/8 with Dr Marin Olp.  Does this need to be repeated on the 16th or is there something else that pt needs to have?

## 2021-06-01 NOTE — Telephone Encounter (Signed)
Called patient to go over her recent CT report Amylase and lipase came back normal No answer so left message on machine.  I will also send her a MyChart message with details regarding CT I think her questions about amylase and lipase levels, these were done by Dr. Antonieta Pert office so no repeat is needed   CT ABDOMEN AND PELVIS WITH CONTRAST   TECHNIQUE: Multidetector CT imaging of the abdomen and pelvis was performed using the standard protocol following bolus administration of intravenous contrast.   RADIATION DOSE REDUCTION: This exam was performed according to the departmental dose-optimization program which includes automated exposure control, adjustment of the mA and/or kV according to patient size and/or use of iterative reconstruction technique.   CONTRAST:  169mL OMNIPAQUE IOHEXOL 300 MG/ML  SOLN   COMPARISON:  05/28/2021   FINDINGS: Lower chest: Small pleural-based infiltrates are seen in the posterior right lower lung fields. This finding may suggest atelectasis/pneumonia or pulmonary infarction. Small pericardial effusion is seen.   Hepatobiliary: There is fatty infiltration. Gallbladder is not distended.   Pancreas: There is mild dilation of pancreatic duct. There is 7 mm low-density in the head of the pancreas. There are few low-density foci in the body of pancreas each measuring less than 11 mm. There is a 10 mm low-density lesion in the tail of pancreas. There is no peripancreatic stranding or fluid collection.   Spleen: Spleen measures 12.5 cm in maximum diameter.   Adrenals/Urinary Tract: Adrenals are unremarkable. There is no hydronephrosis. There are few fluid density structures in the central portion of both kidneys in the lower pole suggesting possible parapelvic cysts. There are no renal or ureteral stones. Urinary bladder is not distended.   Stomach/Bowel: Small hiatal hernia is seen. Stomach is not distended. Small bowel loops are unremarkable.  Appendix is not seen. There is no pericecal inflammation. There is no significant wall thickening in colon. Few diverticula are seen in the colon without signs of focal diverticulitis.   Vascular/Lymphatic: There are scattered arterial calcifications.   Reproductive: Uterus is not seen.  There are no adnexal masses.   Other: There is no ascites or pneumoperitoneum.   Musculoskeletal: Degenerative changes are noted in the lower thoracic spine and lumbar spine, more severe at L2-L3 and L5-S1 levels.   IMPRESSION: There few small ill-defined low-density foci in the head, body and tail of pancreas each measuring less than 11 mm. There is slight prominence of pancreatic duct. Findings suggest possible benign pancreatic cysts or cystic neoplasm. There are no imaging signs of acute pancreatitis. Short-term follow-up multiphasic CT or MRI in 2-3 months may be considered.   There is no evidence of intestinal obstruction or pneumoperitoneum. There is no hydronephrosis.   There are parapelvic cysts in both kidneys. Fatty liver. Small hiatal hernia. Small pericardial effusion. Small patchy infiltrates in the posterior right lower lung fields may suggest atelectasis/pneumonia or evolving infarcts.   Other findings as described in the body of the report.

## 2021-06-01 NOTE — Telephone Encounter (Signed)
Patient would like to know if her future lab appointment with [place] is necessary, since she has seen Dr. Marin Olp and he has already gotten a lot of labs from her. Please advice.

## 2021-06-02 NOTE — Telephone Encounter (Signed)
Spoke with pt, she is aware and appt has been cancelled.

## 2021-06-03 ENCOUNTER — Encounter: Payer: Self-pay | Admitting: Family Medicine

## 2021-06-03 DIAGNOSIS — Q453 Other congenital malformations of pancreas and pancreatic duct: Secondary | ICD-10-CM

## 2021-06-04 ENCOUNTER — Other Ambulatory Visit: Payer: Medicare Other

## 2021-06-08 ENCOUNTER — Other Ambulatory Visit: Payer: Self-pay | Admitting: Family Medicine

## 2021-06-08 DIAGNOSIS — G4700 Insomnia, unspecified: Secondary | ICD-10-CM

## 2021-06-09 ENCOUNTER — Other Ambulatory Visit: Payer: Self-pay | Admitting: Family Medicine

## 2021-06-09 DIAGNOSIS — F321 Major depressive disorder, single episode, moderate: Secondary | ICD-10-CM

## 2021-06-10 ENCOUNTER — Ambulatory Visit: Payer: Medicare Other | Admitting: Family Medicine

## 2021-06-11 ENCOUNTER — Encounter: Payer: Self-pay | Admitting: Family Medicine

## 2021-06-11 DIAGNOSIS — I2699 Other pulmonary embolism without acute cor pulmonale: Secondary | ICD-10-CM

## 2021-06-11 MED ORDER — RIVAROXABAN 20 MG PO TABS: 20.0000 mg | ORAL_TABLET | Freq: Every day | ORAL | 1 refills | Status: DC

## 2021-06-12 ENCOUNTER — Telehealth: Payer: Self-pay | Admitting: *Deleted

## 2021-06-12 NOTE — Telephone Encounter (Signed)
Prior auth started via cover my meds.  Awaiting determination.  Key: [id number]

## 2021-06-12 NOTE — Telephone Encounter (Signed)
[id number];DFGILU:[id number];Review Type:Qty;Coverage Start Date:05/13/2021;Coverage End Date:06/12/2022

## 2021-06-15 ENCOUNTER — Encounter: Payer: Self-pay | Admitting: Family Medicine

## 2021-06-15 DIAGNOSIS — Z20822 Contact with and (suspected) exposure to covid-19: Secondary | ICD-10-CM | POA: Diagnosis not present

## 2021-06-19 ENCOUNTER — Other Ambulatory Visit: Payer: Self-pay | Admitting: Family Medicine

## 2021-06-19 DIAGNOSIS — E785 Hyperlipidemia, unspecified: Secondary | ICD-10-CM

## 2021-06-30 DIAGNOSIS — Z20822 Contact with and (suspected) exposure to covid-19: Secondary | ICD-10-CM | POA: Diagnosis not present

## 2021-07-08 ENCOUNTER — Other Ambulatory Visit: Payer: Self-pay

## 2021-07-08 ENCOUNTER — Encounter: Payer: Self-pay | Admitting: Family Medicine

## 2021-07-08 ENCOUNTER — Encounter: Payer: Self-pay | Admitting: Hematology & Oncology

## 2021-07-08 ENCOUNTER — Inpatient Hospital Stay (HOSPITAL_BASED_OUTPATIENT_CLINIC_OR_DEPARTMENT_OTHER): Payer: Medicare Other | Admitting: Hematology & Oncology

## 2021-07-08 ENCOUNTER — Inpatient Hospital Stay: Payer: Medicare Other | Attending: Hematology & Oncology

## 2021-07-08 VITALS — BP 119/55 | HR 65 | Temp 98.4°F | Resp 18 | Ht 65.0 in | Wt 165.1 lb

## 2021-07-08 DIAGNOSIS — R0789 Other chest pain: Secondary | ICD-10-CM | POA: Diagnosis not present

## 2021-07-08 DIAGNOSIS — I2601 Septic pulmonary embolism with acute cor pulmonale: Secondary | ICD-10-CM

## 2021-07-08 DIAGNOSIS — I2699 Other pulmonary embolism without acute cor pulmonale: Secondary | ICD-10-CM | POA: Insufficient documentation

## 2021-07-08 DIAGNOSIS — E119 Type 2 diabetes mellitus without complications: Secondary | ICD-10-CM | POA: Diagnosis not present

## 2021-07-08 DIAGNOSIS — I63423 Cerebral infarction due to embolism of bilateral anterior cerebral arteries: Secondary | ICD-10-CM

## 2021-07-08 DIAGNOSIS — H5213 Myopia, bilateral: Secondary | ICD-10-CM | POA: Diagnosis not present

## 2021-07-08 DIAGNOSIS — H02831 Dermatochalasis of right upper eyelid: Secondary | ICD-10-CM | POA: Diagnosis not present

## 2021-07-08 DIAGNOSIS — H02834 Dermatochalasis of left upper eyelid: Secondary | ICD-10-CM | POA: Diagnosis not present

## 2021-07-08 DIAGNOSIS — I2693 Single subsegmental pulmonary embolism without acute cor pulmonale: Secondary | ICD-10-CM | POA: Diagnosis not present

## 2021-07-08 DIAGNOSIS — Z7901 Long term (current) use of anticoagulants: Secondary | ICD-10-CM | POA: Diagnosis not present

## 2021-07-08 HISTORY — DX: Other pulmonary embolism without acute cor pulmonale: I26.99

## 2021-07-08 LAB — CBC WITH DIFFERENTIAL (CANCER CENTER ONLY)
Abs Immature Granulocytes: 0.02 10*3/uL (ref 0.00–0.07)
Basophils Absolute: 0.1 10*3/uL (ref 0.0–0.1)
Basophils Relative: 1 %
Eosinophils Absolute: 0.1 10*3/uL (ref 0.0–0.5)
Eosinophils Relative: 1 %
HCT: 37.9 % (ref 36.0–46.0)
Hemoglobin: 12.2 g/dL (ref 12.0–15.0)
Immature Granulocytes: 0 %
Lymphocytes Relative: 29 %
Lymphs Abs: 1.6 10*3/uL (ref 0.7–4.0)
MCH: 26.7 pg (ref 26.0–34.0)
MCHC: 32.2 g/dL (ref 30.0–36.0)
MCV: 82.9 fL (ref 80.0–100.0)
Monocytes Absolute: 0.3 10*3/uL (ref 0.1–1.0)
Monocytes Relative: 6 %
Neutro Abs: 3.5 10*3/uL (ref 1.7–7.7)
Neutrophils Relative %: 63 %
Platelet Count: 192 10*3/uL (ref 150–400)
RBC: 4.57 MIL/uL (ref 3.87–5.11)
RDW: 13.7 % (ref 11.5–15.5)
WBC Count: 5.6 10*3/uL (ref 4.0–10.5)
nRBC: 0 % (ref 0.0–0.2)

## 2021-07-08 LAB — CMP (CANCER CENTER ONLY)
ALT: 14 U/L (ref 0–44)
AST: 14 U/L — ABNORMAL LOW (ref 15–41)
Albumin: 4.2 g/dL (ref 3.5–5.0)
Alkaline Phosphatase: 59 U/L (ref 38–126)
Anion gap: 6 (ref 5–15)
BUN: 15 mg/dL (ref 8–23)
CO2: 28 mmol/L (ref 22–32)
Calcium: 9.7 mg/dL (ref 8.9–10.3)
Chloride: 108 mmol/L (ref 98–111)
Creatinine: 0.93 mg/dL (ref 0.44–1.00)
GFR, Estimated: >60 mL/min (ref >60)
Glucose, Bld: 129 mg/dL — ABNORMAL HIGH (ref 70–99)
Potassium: 4.7 mmol/L (ref 3.5–5.1)
Sodium: 142 mmol/L (ref 135–145)
Total Bilirubin: 0.4 mg/dL (ref 0.3–1.2)
Total Protein: 6.4 g/dL — ABNORMAL LOW (ref 6.5–8.1)

## 2021-07-08 LAB — HM DIABETES EYE EXAM

## 2021-07-08 LAB — D-DIMER, QUANTITATIVE: D-Dimer, Quant: <0.27 ug/mL-FEU (ref 0.00–0.50)

## 2021-07-08 NOTE — Progress Notes (Signed)
?Hematology and Oncology Follow Up Visit ? ?Monica Kane ?[id number] ?11/12/1945 76 y.o. ?07/08/2021 ? ? ?Principle Diagnosis:  ?Pulmonary embolism-bilateral-idiopathic ? ?Current Therapy:   ?Xarelto 20 mg p.o. daily-started on 05/24/2021 ?    ?Interim History:  Monica Kane is back for follow-up.  We first saw her on 05/27/2021.  She had a pulmonary embolism earlier in the week.  She is on Xarelto. ? ?We did a hypercoagulable work-up.  Surprisingly, all the test came back negative. ? ?We did do Dopplers of her legs.  Again, there is no thromboembolic disease in the legs. ? ?She is doing okay on the Xarelto.  She says there is a little bit of balance issues.  She also has little bit of cognition impairment.  I am not sure if this is from the [place].  If so, we could always make a change to something different. ? ?She has had no bleeding.  She has had no change in bowel or bladder habits.  She says that her breathing feels better.  She still has a little bit of chest discomfort. ? ?She does not feel as short of breath. ? ?There is no fever.  She has had no nausea or vomiting.  There has been no leg swelling. ? ?She has had no fever. ? ?Overall, I would say performance status is ECOG 1. ? ?Medications:  ?Current Outpatient Medications:  ?  amitriptyline (ELAVIL) 25 MG tablet, TAKE ONE TABLET BY MOUTH AT BEDTIME., Disp: 90 tablet, Rfl: 1 ?  carvedilol (COREG) 3.125 MG tablet, TAKE 1 TABLET BY MOUTH TWICE DAILY WITH A MEAL. (Patient taking differently: at bedtime.), Disp: 180 tablet, Rfl: 2 ?  lisinopril (ZESTRIL) 40 MG tablet, Take 1 tablet (40 mg total) by mouth daily., Disp: 90 tablet, Rfl: 3 ?  metFORMIN (GLUCOPHAGE) 500 MG tablet, Take 1 tablet (500 mg total) by mouth daily. May start with a 1/2 tablet and increase as tolerated, Disp: 90 tablet, Rfl: 3 ?  rivaroxaban (XARELTO) 20 MG TABS tablet, Take 1 tablet (20 mg total) by mouth daily with supper., Disp: 90 tablet, Rfl: 1 ?  rosuvastatin (CRESTOR) 10 MG tablet,  TAKE ONE TABLET BY MOUTH DAILY, Disp: 90 tablet, Rfl: 1 ?  sertraline (ZOLOFT) 100 MG tablet, TAKE 2 & 1/2 TABLETS DAILY., Disp: 225 tablet, Rfl: 3 ?  traZODone (DESYREL) 50 MG tablet, TAKE 1 TO 2 TABLETS AT BEDTIME, Disp: 180 tablet, Rfl: 1 ? ?Allergies:  ?Allergies  ?Allergen Reactions  ? Penicillins Swelling and Rash  ?  Facial swelling  ? ? ?Past Medical History, Surgical history, Social history, and Family History were reviewed and updated. ? ?Review of Systems: ?Review of Systems  ?Constitutional: Negative.   ?HENT:  Negative.    ?Eyes: Negative.   ?Respiratory: Negative.    ?Cardiovascular: Negative.   ?Gastrointestinal: Negative.   ?Endocrine: Negative.   ?Genitourinary: Negative.    ?Musculoskeletal: Negative.   ?Skin: Negative.   ?Neurological: Negative.   ?Hematological: Negative.   ?Psychiatric/Behavioral: Negative.    ? ?Physical Exam: ? height is '5\' 5"'$  (1.651 m) and weight is 165 lb 1.9 oz (74.9 kg). Her oral temperature is 98.4 ?F (36.9 ?C). Her blood pressure is 119/55 (abnormal) and her pulse is 65. Her respiration is 18 and oxygen saturation is 100%.  ? ?Wt Readings from Last 3 Encounters:  ?07/08/21 165 lb 1.9 oz (74.9 kg)  ?05/27/21 162 lb 9.6 oz (73.8 kg)  ?05/27/21 163 lb 1.3 oz (74 kg)  ? ? ?Physical  Exam ?Vitals reviewed.  ?HENT:  ?   Head: Normocephalic and atraumatic.  ?Eyes:  ?   Pupils: Pupils are equal, round, and reactive to light.  ?Cardiovascular:  ?   Rate and Rhythm: Normal rate and regular rhythm.  ?   Heart sounds: Normal heart sounds.  ?Pulmonary:  ?   Effort: Pulmonary effort is normal.  ?   Breath sounds: Normal breath sounds.  ?Abdominal:  ?   General: Bowel sounds are normal.  ?   Palpations: Abdomen is soft.  ?Musculoskeletal:     ?   General: No tenderness or deformity. Normal range of motion.  ?   Cervical back: Normal range of motion.  ?Lymphadenopathy:  ?   Cervical: No cervical adenopathy.  ?Skin: ?   General: Skin is warm and dry.  ?   Findings: No erythema or rash.   ?Neurological:  ?   Mental Status: She is alert and oriented to person, place, and time.  ?Psychiatric:     ?   Behavior: Behavior normal.     ?   Thought Content: Thought content normal.     ?   Judgment: Judgment normal.  ? ? ? ?Lab Results  ?Component Value Date  ? WBC 5.6 07/08/2021  ? HGB 12.2 07/08/2021  ? HCT 37.9 07/08/2021  ? MCV 82.9 07/08/2021  ? PLT 192 07/08/2021  ? ?  Chemistry   ?   ?Component Value Date/Time  ? NA 142 07/08/2021 0947  ? K 4.7 07/08/2021 0947  ? CL 108 07/08/2021 0947  ? CO2 28 07/08/2021 0947  ? BUN 15 07/08/2021 0947  ? CREATININE 0.93 07/08/2021 0947  ? CREATININE 0.81 08/12/2016 1348  ?    ?Component Value Date/Time  ? CALCIUM 9.7 07/08/2021 0947  ? ALKPHOS 59 07/08/2021 0947  ? AST 14 (L) 07/08/2021 0947  ? ALT 14 07/08/2021 0947  ? BILITOT 0.4 07/08/2021 0947  ?  ? ? ?Impression and Plan: ?Monica Kane is a very nice 76 year old white female.  She has not pulmonary emboli.  This is idiopathic from all of her studies to date.  Thankfully, she did not have a high clot burden. ? ?I forgot to mention that she is going to have a MRI of the abdomen to evaluate her pancreas.  There is some cysts that we last saw.  I cannot imagine that these would be causing her any issues. ? ?I would like to get a CT angiogram of her chest when we see her back.  I would like to believe that the pulmonary emboli have resolved. ? ?She will need at least 2 years of full dose anticoagulation in my opinion.  After that, she will need maintenance anticoagulation.  She has idiopathic emboli.  I would hate to commit her to lifelong anticoagulation. ? ?We will plan to get her back in about 2 months. ? ? ?Volanda Napoleon, MD ?3/22/202311:23 AM  ?

## 2021-07-09 ENCOUNTER — Encounter: Payer: Self-pay | Admitting: Hematology & Oncology

## 2021-07-09 LAB — DRVVT CONFIRM: dRVVT Confirm: 1.5 ratio — ABNORMAL HIGH (ref 0.8–1.2)

## 2021-07-09 LAB — DRVVT MIX: dRVVT Mix: 73 s — ABNORMAL HIGH (ref 0.0–40.4)

## 2021-07-09 LAB — LUPUS ANTICOAGULANT PANEL
DRVVT: 93.4 s — ABNORMAL HIGH (ref 0.0–47.0)
PTT Lupus Anticoagulant: 34.8 s (ref 0.0–43.5)

## 2021-07-10 LAB — BETA-2-GLYCOPROTEIN I ABS, IGG/M/A
Beta-2 Glyco I IgG: <9 GPI IgG units (ref 0–20)
Beta-2-Glycoprotein I IgA: <9 GPI IgA units (ref 0–25)
Beta-2-Glycoprotein I IgM: <9 GPI IgM units (ref 0–32)

## 2021-07-10 LAB — CARDIOLIPIN ANTIBODIES, IGG, IGM, IGA
Anticardiolipin IgA: <9 APL U/mL (ref 0–11)
Anticardiolipin IgG: <9 GPL U/mL (ref 0–14)
Anticardiolipin IgM: <9 MPL U/mL (ref 0–12)

## 2021-07-21 DIAGNOSIS — Z20822 Contact with and (suspected) exposure to covid-19: Secondary | ICD-10-CM | POA: Diagnosis not present

## 2021-07-23 DIAGNOSIS — Z20822 Contact with and (suspected) exposure to covid-19: Secondary | ICD-10-CM | POA: Diagnosis not present

## 2021-08-05 ENCOUNTER — Other Ambulatory Visit: Payer: Self-pay

## 2021-08-05 ENCOUNTER — Encounter: Payer: Self-pay | Admitting: Hematology & Oncology

## 2021-08-05 ENCOUNTER — Encounter (HOSPITAL_BASED_OUTPATIENT_CLINIC_OR_DEPARTMENT_OTHER): Payer: Self-pay

## 2021-08-05 ENCOUNTER — Inpatient Hospital Stay (HOSPITAL_BASED_OUTPATIENT_CLINIC_OR_DEPARTMENT_OTHER): Payer: Medicare Other | Admitting: Hematology & Oncology

## 2021-08-05 ENCOUNTER — Inpatient Hospital Stay: Payer: Medicare Other | Attending: Hematology & Oncology

## 2021-08-05 ENCOUNTER — Ambulatory Visit (HOSPITAL_BASED_OUTPATIENT_CLINIC_OR_DEPARTMENT_OTHER)
Admission: RE | Admit: 2021-08-05 | Discharge: 2021-08-05 | Disposition: A | Discharge status: Home / Self Care | Payer: Medicare Other | Source: Ambulatory Visit | Attending: Hematology & Oncology | Admitting: Hematology & Oncology

## 2021-08-05 VITALS — BP 153/60 | HR 64 | Temp 98.3°F | Resp 18 | Ht 65.0 in | Wt 167.0 lb

## 2021-08-05 DIAGNOSIS — I2699 Other pulmonary embolism without acute cor pulmonale: Secondary | ICD-10-CM

## 2021-08-05 DIAGNOSIS — R0789 Other chest pain: Secondary | ICD-10-CM | POA: Diagnosis not present

## 2021-08-05 DIAGNOSIS — I700 Atherosclerosis of aorta: Secondary | ICD-10-CM | POA: Diagnosis not present

## 2021-08-05 DIAGNOSIS — J9859 Other diseases of mediastinum, not elsewhere classified: Secondary | ICD-10-CM | POA: Diagnosis not present

## 2021-08-05 DIAGNOSIS — I2602 Saddle embolus of pulmonary artery with acute cor pulmonale: Secondary | ICD-10-CM | POA: Diagnosis not present

## 2021-08-05 DIAGNOSIS — I2510 Atherosclerotic heart disease of native coronary artery without angina pectoris: Secondary | ICD-10-CM | POA: Diagnosis not present

## 2021-08-05 LAB — CBC WITH DIFFERENTIAL (CANCER CENTER ONLY)
Abs Immature Granulocytes: 0.02 10*3/uL (ref 0.00–0.07)
Basophils Absolute: 0.1 10*3/uL (ref 0.0–0.1)
Basophils Relative: 1 %
Eosinophils Absolute: 0.1 10*3/uL (ref 0.0–0.5)
Eosinophils Relative: 2 %
HCT: 41.3 % (ref 36.0–46.0)
Hemoglobin: 13 g/dL (ref 12.0–15.0)
Immature Granulocytes: 0 %
Lymphocytes Relative: 28 %
Lymphs Abs: 1.7 10*3/uL (ref 0.7–4.0)
MCH: 26.7 pg (ref 26.0–34.0)
MCHC: 31.5 g/dL (ref 30.0–36.0)
MCV: 84.8 fL (ref 80.0–100.0)
Monocytes Absolute: 0.3 10*3/uL (ref 0.1–1.0)
Monocytes Relative: 6 %
Neutro Abs: 3.8 10*3/uL (ref 1.7–7.7)
Neutrophils Relative %: 63 %
Platelet Count: 202 10*3/uL (ref 150–400)
RBC: 4.87 MIL/uL (ref 3.87–5.11)
RDW: 13.1 % (ref 11.5–15.5)
WBC Count: 6 10*3/uL (ref 4.0–10.5)
nRBC: 0 % (ref 0.0–0.2)

## 2021-08-05 LAB — CMP (CANCER CENTER ONLY)
ALT: 14 U/L (ref 0–44)
AST: 15 U/L (ref 15–41)
Albumin: 4.3 g/dL (ref 3.5–5.0)
Alkaline Phosphatase: 63 U/L (ref 38–126)
Anion gap: 7 (ref 5–15)
BUN: 20 mg/dL (ref 8–23)
CO2: 30 mmol/L (ref 22–32)
Calcium: 9.9 mg/dL (ref 8.9–10.3)
Chloride: 106 mmol/L (ref 98–111)
Creatinine: 0.97 mg/dL (ref 0.44–1.00)
GFR, Estimated: >60 mL/min (ref >60)
Glucose, Bld: 123 mg/dL — ABNORMAL HIGH (ref 70–99)
Potassium: 4.2 mmol/L (ref 3.5–5.1)
Sodium: 143 mmol/L (ref 135–145)
Total Bilirubin: 0.4 mg/dL (ref 0.3–1.2)
Total Protein: 6.8 g/dL (ref 6.5–8.1)

## 2021-08-05 LAB — D-DIMER, QUANTITATIVE: D-Dimer, Quant: <0.27 ug/mL-FEU (ref 0.00–0.50)

## 2021-08-05 MED ORDER — IOHEXOL 300 MG/ML  SOLN: 100.0000 mL | Freq: Once | INTRAMUSCULAR | Status: DC | PRN

## 2021-08-05 MED ORDER — IOHEXOL 350 MG/ML SOLN
100.0000 mL | Freq: Once | INTRAVENOUS | Status: AC | PRN
  Administered 2021-08-05: 75 mL via INTRAVENOUS

## 2021-08-05 NOTE — Progress Notes (Signed)
?Hematology and Oncology Follow Up Visit ? ?Monica Kane ?[id number] ?1945-09-21 77 y.o. ?08/05/2021 ? ? ?Principle Diagnosis:  ?Pulmonary embolism-bilateral-idiopathic ? ?Current Therapy:   ?Xarelto 20 mg p.o. daily-started on 05/24/2021 ?    ?Interim History:  Monica Kane is back for follow-up.  We did do a CT angiogram of her chest.  This did not show any residual pulmonary embolus. ? ?The big news is that she is headed to [place] in a week or so.  She will be gone for 4 weeks.  A niece is living over there with her family.  I am sure that she will have a wonderful time.  I told her to make sure that she stays well-hydrated. ? ?In July, she and her husband will be going over to [place] for a dance competition.  So happy for her.  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 ? ?She is doing well on the Xarelto.  She has had no problems with bleeding.  There is been no problems with cough or shortness of breath.  She has had no chest wall pain..  She has had no leg swelling.  She has had no rashes.  There is been no bruises. ? ?She has had no fever. ? ?Overall, I would say performance status is probably ECOG 0.   ? ?Medications:  ?Current Outpatient Medications:  ?  amitriptyline (ELAVIL) 25 MG tablet, TAKE ONE TABLET BY MOUTH AT BEDTIME., Disp: 90 tablet, Rfl: 1 ?  carvedilol (COREG) 3.125 MG tablet, TAKE 1 TABLET BY MOUTH TWICE DAILY WITH A MEAL. (Patient taking differently: at bedtime.), Disp: 180 tablet, Rfl: 2 ?  lisinopril (ZESTRIL) 40 MG tablet, Take 1 tablet (40 mg total) by mouth daily., Disp: 90 tablet, Rfl: 3 ?  metFORMIN (GLUCOPHAGE) 500 MG tablet, Take 1 tablet (500 mg total) by mouth daily. May start with a 1/2 tablet and increase as tolerated, Disp: 90 tablet, Rfl: 3 ?  rivaroxaban (XARELTO) 20 MG TABS tablet, Take 1 tablet (20 mg total) by mouth daily with supper., Disp: 90 tablet, Rfl: 1 ?  rosuvastatin (CRESTOR) 10 MG tablet, TAKE ONE TABLET BY MOUTH DAILY, Disp: 90 tablet, Rfl: 1 ?  sertraline  (ZOLOFT) 100 MG tablet, TAKE 2 & 1/2 TABLETS DAILY., Disp: 225 tablet, Rfl: 3 ?  traZODone (DESYREL) 50 MG tablet, TAKE 1 TO 2 TABLETS AT BEDTIME, Disp: 180 tablet, Rfl: 1 ? ?Allergies:  ?Allergies  ?Allergen Reactions  ? Penicillins Swelling and Rash  ?  Facial swelling  ? ? ?Past Medical History, Surgical history, Social history, and Family History were reviewed and updated. ? ?Review of Systems: ?Review of Systems  ?Constitutional: Negative.   ?HENT:  Negative.    ?Eyes: Negative.   ?Respiratory: Negative.    ?Cardiovascular: Negative.   ?Gastrointestinal: Negative.   ?Endocrine: Negative.   ?Genitourinary: Negative.    ?Musculoskeletal: Negative.   ?Skin: Negative.   ?Neurological: Negative.   ?Hematological: Negative.   ?Psychiatric/Behavioral: Negative.    ? ?Physical Exam: ? height is '5\' 5"'$  (1.651 m) and weight is 167 lb (75.8 kg). Her oral temperature is 98.3 ?F (36.8 ?C). Her blood pressure is 153/60 (abnormal) and her pulse is 64. Her respiration is 18 and oxygen saturation is 100%.  ? ?Wt Readings from Last 3 Encounters:  ?08/05/21 167 lb (75.8 kg)  ?07/08/21 165 lb 1.9 oz (74.9 kg)  ?05/27/21 162 lb 9.6 oz (73.8 kg)  ? ? ?Physical Exam ?Vitals reviewed.  ?HENT:  ?   Head:  Normocephalic and atraumatic.  ?Eyes:  ?   Pupils: Pupils are equal, round, and reactive to light.  ?Cardiovascular:  ?   Rate and Rhythm: Normal rate and regular rhythm.  ?   Heart sounds: Normal heart sounds.  ?Pulmonary:  ?   Effort: Pulmonary effort is normal.  ?   Breath sounds: Normal breath sounds.  ?Abdominal:  ?   General: Bowel sounds are normal.  ?   Palpations: Abdomen is soft.  ?Musculoskeletal:     ?   General: No tenderness or deformity. Normal range of motion.  ?   Cervical back: Normal range of motion.  ?Lymphadenopathy:  ?   Cervical: No cervical adenopathy.  ?Skin: ?   General: Skin is warm and dry.  ?   Findings: No erythema or rash.  ?Neurological:  ?   Mental Status: She is alert and oriented to person, place, and  time.  ?Psychiatric:     ?   Behavior: Behavior normal.     ?   Thought Content: Thought content normal.     ?   Judgment: Judgment normal.  ? ? ? ?Lab Results  ?Component Value Date  ? WBC 6.0 08/05/2021  ? HGB 13.0 08/05/2021  ? HCT 41.3 08/05/2021  ? MCV 84.8 08/05/2021  ? PLT 202 08/05/2021  ? ?  Chemistry   ?   ?Component Value Date/Time  ? NA 143 08/05/2021 0939  ? K 4.2 08/05/2021 0939  ? CL 106 08/05/2021 0939  ? CO2 30 08/05/2021 0939  ? BUN 20 08/05/2021 0939  ? CREATININE 0.97 08/05/2021 0939  ? CREATININE 0.81 08/12/2016 1348  ?    ?Component Value Date/Time  ? CALCIUM 9.9 08/05/2021 0939  ? ALKPHOS 63 08/05/2021 0939  ? AST 15 08/05/2021 0939  ? ALT 14 08/05/2021 0939  ? BILITOT 0.4 08/05/2021 0939  ?  ? ? ?Impression and Plan: ?Monica Kane is a very nice 76 year old white female.  She has bilateral pulmonary emboli.  This is idiopathic from all of her studies to date.  Thankfully, she did not have a high clot burden. ? ?The Xarelto is working quite well.  Her recent CT angiogram, done today, does not show any residual blood clots. ? ?She still will need full anticoagulation for 2 years.  After that, I would then put her on maintenance anticoagulation. ? ?Again, I see no problem with her going over to [place].  She is on anticoagulation.  She will stay hydrated.  She will be active. ? ?I would like to have her come back in July.  I would like to see you before she heads over to [place]. ? ? ?Volanda Napoleon, MD ?4/19/202312:18 PM  ?

## 2021-08-11 ENCOUNTER — Ambulatory Visit
Admission: RE | Admit: 2021-08-11 | Discharge: 2021-08-11 | Disposition: A | Discharge status: Home / Self Care | Payer: Medicare Other | Source: Ambulatory Visit | Attending: Family Medicine | Admitting: Family Medicine

## 2021-08-11 DIAGNOSIS — N281 Cyst of kidney, acquired: Secondary | ICD-10-CM | POA: Diagnosis not present

## 2021-08-11 DIAGNOSIS — K760 Fatty (change of) liver, not elsewhere classified: Secondary | ICD-10-CM | POA: Diagnosis not present

## 2021-08-11 DIAGNOSIS — Q453 Other congenital malformations of pancreas and pancreatic duct: Secondary | ICD-10-CM

## 2021-08-11 DIAGNOSIS — K862 Cyst of pancreas: Secondary | ICD-10-CM | POA: Diagnosis not present

## 2021-08-11 MED ORDER — GADOBENATE DIMEGLUMINE 529 MG/ML IV SOLN
15.0000 mL | Freq: Once | INTRAVENOUS | Status: AC | PRN
  Administered 2021-08-11: 15 mL via INTRAVENOUS

## 2021-08-12 ENCOUNTER — Encounter: Payer: Self-pay | Admitting: Family Medicine

## 2021-08-13 ENCOUNTER — Encounter: Payer: Self-pay | Admitting: Family Medicine

## 2021-08-19 ENCOUNTER — Telehealth: Payer: Self-pay | Admitting: Family Medicine

## 2021-08-19 NOTE — Telephone Encounter (Signed)
Left message for patient to call back and schedule Medicare Annual Wellness Visit (AWV) either virtually or phone ? ? ?Last AWV 07/17/18 ?; please schedule at anytime with health coach ? ?I left my direct number [phone] ?

## 2021-08-22 DIAGNOSIS — Z20822 Contact with and (suspected) exposure to covid-19: Secondary | ICD-10-CM | POA: Diagnosis not present

## 2021-09-01 ENCOUNTER — Other Ambulatory Visit: Payer: Medicare Other

## 2021-09-09 ENCOUNTER — Ambulatory Visit: Payer: Medicare Other | Admitting: Hematology & Oncology

## 2021-09-09 ENCOUNTER — Inpatient Hospital Stay: Payer: Medicare Other

## 2021-09-18 ENCOUNTER — Other Ambulatory Visit: Payer: Self-pay | Admitting: Family Medicine

## 2021-09-18 DIAGNOSIS — E119 Type 2 diabetes mellitus without complications: Secondary | ICD-10-CM

## 2021-09-18 DIAGNOSIS — G609 Hereditary and idiopathic neuropathy, unspecified: Secondary | ICD-10-CM

## 2021-09-22 DIAGNOSIS — D224 Melanocytic nevi of scalp and neck: Secondary | ICD-10-CM | POA: Diagnosis not present

## 2021-09-22 DIAGNOSIS — L570 Actinic keratosis: Secondary | ICD-10-CM | POA: Diagnosis not present

## 2021-09-22 DIAGNOSIS — D2260 Melanocytic nevi of unspecified upper limb, including shoulder: Secondary | ICD-10-CM | POA: Diagnosis not present

## 2021-09-22 DIAGNOSIS — D225 Melanocytic nevi of trunk: Secondary | ICD-10-CM | POA: Diagnosis not present

## 2021-09-22 DIAGNOSIS — D2230 Melanocytic nevi of unspecified part of face: Secondary | ICD-10-CM | POA: Diagnosis not present

## 2021-09-28 ENCOUNTER — Ambulatory Visit (INDEPENDENT_AMBULATORY_CARE_PROVIDER_SITE_OTHER): Payer: Medicare Other

## 2021-09-28 VITALS — Ht 65.0 in | Wt 160.0 lb

## 2021-09-28 DIAGNOSIS — Z780 Asymptomatic menopausal state: Secondary | ICD-10-CM

## 2021-09-28 DIAGNOSIS — Z0000 Encounter for general adult medical examination without abnormal findings: Secondary | ICD-10-CM

## 2021-09-28 NOTE — Progress Notes (Signed)
Subjective:   Monica Kane is a 76 y.o. female who presents for Medicare Annual (Subsequent) preventive examination.  I connected with Marita today by telephone and verified that I am speaking with the correct person using two identifiers. Location patient: home Location provider: work Persons participating in the virtual visit: patient, Marine scientist.    I discussed the limitations, risks, security and privacy concerns of performing an evaluation and management service by telephone and the availability of in person appointments. I also discussed with the patient that there may be a patient responsible charge related to this service. The patient expressed understanding and verbally consented to this telephonic visit.    Interactive audio and video telecommunications were attempted between this provider and patient, however failed, due to patient having technical difficulties OR patient did not have access to video capability.  We continued and completed visit with audio only.  Some vital signs may be absent or patient reported.   Time Spent with patient on telephone encounter: 20 minutes   Review of Systems     Cardiac Risk Factors include: advanced age (>23mn, >>24women);hypertension;dyslipidemia;diabetes mellitus     Objective:    Today's Vitals   09/28/21 1000  Weight: 160 lb (72.6 kg)  Height: '5\' 5"'$  (1.651 m)   Body mass index is 26.63 kg/m.     09/28/2021   10:03 AM 08/05/2021   11:45 AM 07/08/2021   10:11 AM 05/27/2021   10:51 AM 10/09/2019    6:38 AM 10/03/2019   11:23 AM 07/17/2018    3:33 PM  Advanced Directives  Does Patient Have a Medical Advance Directive? Yes No No No Yes Yes Yes  Type of [place]    [place] Living will;Healthcare Power of [place]  Does patient want to make changes to medical advance directive?  No - Patient declined No - Patient  declined No - Patient declined No - Patient declined No - Patient declined No - Patient declined  Copy of [place] Chart? No - copy requested    No - copy requested  No - copy requested  Would patient like information on creating a medical advance directive?  No - Patient declined No - Patient declined No - Patient declined       Current Medications (verified) Outpatient Encounter Medications as of 09/28/2021  Medication Sig   amitriptyline (ELAVIL) 25 MG tablet TAKE ONE TABLET BY MOUTH AT BEDTIME   carvedilol (COREG) 3.125 MG tablet TAKE 1 TABLET BY MOUTH TWICE DAILY WITH A MEAL. (Patient taking differently: at bedtime.)   lisinopril (ZESTRIL) 40 MG tablet Take 1 tablet (40 mg total) by mouth daily.   metFORMIN (GLUCOPHAGE) 500 MG tablet TAKE ONE TABLET BY MOUTH DAILY. MAY START WITH 1/2 TAB AND INCREASE AS TOLERATED   rivaroxaban (XARELTO) 20 MG TABS tablet Take 1 tablet (20 mg total) by mouth daily with supper.   rosuvastatin (CRESTOR) 10 MG tablet TAKE ONE TABLET BY MOUTH DAILY   sertraline (ZOLOFT) 100 MG tablet TAKE 2 & 1/2 TABLETS DAILY.   traZODone (DESYREL) 50 MG tablet TAKE 1 TO 2 TABLETS AT BEDTIME   No facility-administered encounter medications on file as of 09/28/2021.    Allergies (verified) Penicillins   History: Past Medical History:  Diagnosis Date   Anxiety    takes Ativan nightly   Depression    takes Zoloft daily   History of colon  polyps    History of kidney stones    History of staph infection 11/2013   Hyperlipidemia    takes Crestor nightly    Hypertension    takes Lisinopril and Coreg daily   Joint pain    Joint swelling    Neuropathy    takes Elavil nightly   Pneumonia    Pre-diabetes    Primary localized osteoarthritis of right knee    Pulmonary embolism ([place]) 07/08/2021   Sleep apnea    uses oral appliance   Past Surgical History:  Procedure Laterality Date   ABDOMINAL HYSTERECTOMY  1989   APPENDECTOMY     CARDIAC  CATHETERIZATION  2015   CERVICAL SPINE SURGERY  2004   C 5-6   COLONOSCOPY     colonscopy     DIAGNOSTIC LAPAROSCOPY  1976   fibroid removed   ELBOW ARTHROSCOPY  2010, 2012   EYE SURGERY Bilateral    cataract   FOOT SURGERY Left 2013   GASTROCNEMIUS RECESSION Right 10/09/2019   Procedure: RIGHT GASTROCNEMIUS RECESSION;  Surgeon: Newt Minion, MD;  Location: [place];  Service: Orthopedics;  Laterality: Right;   KNEE ARTHROSCOPY  Oct 2014   KNEE ARTHROSCOPY W/ MENISCECTOMY Right 01/31/2013   [place]   LATERAL EPICONDYLE RELEASE  03/01/2011   Procedure: TENNIS ELBOW RELEASE;  Surgeon: Lorn Junes, MD;  Location: [place];  Service: Orthopedics;  Laterality: Left;  TENOTOMY ELBOW LATERAL EPICONDYLITIS TENNIS ELBOW   lateral release right elbow  2010   left foot surgery  2013   mortons neuroma   LEFT HEART CATHETERIZATION WITH CORONARY ANGIOGRAM N/A 07/23/2013   Procedure: LEFT HEART CATHETERIZATION WITH CORONARY ANGIOGRAM;  Surgeon: Sinclair Grooms, MD;  Location[facility] CATH LAB;  Service: Cardiovascular;  Laterality: N/A;   PAROTID GLAND TUMOR EXCISION  1976   benign   PAROTID GLAND TUMOR EXCISION  1976   rt   SHOULDER ARTHROSCOPY DISTAL CLAVICLE EXCISION AND OPEN ROTATOR CUFF REPAIR  2006   SHOULDER ARTHROSCOPY W/ ROTATOR CUFF REPAIR  2006   TOTAL KNEE ARTHROPLASTY Right 02/18/2014   Procedure: TOTAL KNEE ARTHROPLASTY;  Surgeon: Lorn Junes, MD;  Location: [place];  Service: Orthopedics;  Laterality: Right;   WEIL OSTEOTOMY Right 10/09/2019   Procedure: WEIL OSTEOTOMY RIGHT 2ND METATARSAL;  Surgeon: Newt Minion, MD;  Location: [place];  Service: Orthopedics;  Laterality: Right;   Family History  Problem Relation Age of Onset   Cancer Mother        lung   Cancer Father        lung; + tobacco   Heart disease Maternal Grandfather        heart attack   Heart disease Paternal Grandfather        heart attack   Diabetes  Sister    Hypertension Sister    Hyperlipidemia Sister    Breast cancer Neg Hx    Social History   Socioeconomic History   Marital status: Married    Spouse name: Izzie Geers   Number of children: Not on file   Years of education: Not on file   Highest education level: Not on file  Occupational History   Not on file  Tobacco Use   Smoking status: Never   Smokeless tobacco: Never  Vaping Use   Vaping Use: Never used  Substance and Sexual Activity   Alcohol use: Yes    Comment: social   Drug use:  No   Sexual activity: Yes    Birth control/protection: Surgical  Other Topics Concern   Not on file  Social History Narrative   Lives with her husband.  He has 2 adult children. Patient has a Financial risk analyst, and she does exercise.   Social Determinants of Health   Financial Resource Strain: Low Risk  (09/28/2021)   Overall Financial Resource Strain (CARDIA)    Difficulty of Paying Living Expenses: Not hard at all  Food Insecurity: No Food Insecurity (09/28/2021)   Hunger Vital Sign    Worried About Running Out of Food in the Last Year: Never true    Ran Out of Food in the Last Year: Never true  Transportation Needs: No Transportation Needs (09/28/2021)   PRAPARE - Hydrologist (Medical): No    Lack of Transportation (Non-Medical): No  Physical Activity: Sufficiently Active (09/28/2021)   Exercise Vital Sign    Days of Exercise per Week: 3 days    Minutes of Exercise per Session: 60 min  Stress: No Stress Concern Present (09/28/2021)   [place]    Feeling of Stress : Not at all  Social Connections: Moderately Isolated (09/28/2021)   Social Connection and Isolation Panel [NHANES]    Frequency of Communication with Friends and Family: Twice a week    Frequency of Social Gatherings with Friends and Family: Once a week    Attends Religious Services: Never    Marine scientist  or Organizations: No    Attends Music therapist: Never    Marital Status: Married    Tobacco Counseling Counseling given: Not Answered   Clinical Intake:  Pre-visit preparation completed: Yes        BMI - recorded: 26.63 Nutritional Status: BMI 25 -29 Overweight Nutritional Risks: None Diabetes: Yes CBG done?: No Did pt. bring in CBG monitor from home?: No (phone visit)  How often do you need to have someone help you when you read instructions, pamphlets, or other written materials from your doctor or pharmacy?: 1 - Never  Diabetes:  Is the patient diabetic?  Yes  If diabetic, was a CBG obtained today?  No  Did the patient bring in their glucometer from home?  No phone visit How often do you monitor your CBG's? never.   Financial Strains and Diabetes Management:  Are you having any financial strains with the device, your supplies or your medication? No .  Does the patient want to be seen by Chronic Care Management for management of their diabetes?  No  Would the patient like to be referred to a Nutritionist or for Diabetic Management?  No   Diabetic Exams:  Diabetic Eye Exam: Completed 07/08/2021.   Diabetic Foot Exam: Completed 05/04/2021.   Interpreter Needed?: No  Information entered by :: Caroleen Hamman LPN   Activities of Daily Living    09/28/2021   10:07 AM  In your present state of health, do you have any difficulty performing the following activities:  Hearing? 0  Vision? 0  Difficulty concentrating or making decisions? 0  Walking or climbing stairs? 0  Dressing or bathing? 0  Doing errands, shopping? 0  Preparing Food and eating ? N  Using the Toilet? N  In the past six months, have you accidently leaked urine? N  Do you have problems with loss of bowel control? N  Managing your Medications? N  Managing your Finances? N  Housekeeping  or managing your Housekeeping? N    Patient Care Team: Copland, Gay Filler, MD as PCP -  General (Family Medicine) Jovita Gamma, MD as Consulting Physician (Neurosurgery)  Indicate any recent Medical Services you may have received from other than [facility] providers in the past year (date may be approximate).     Assessment:   This is a routine wellness examination for [place].  Hearing/Vision screen Hearing Screening - Comments:: No issues Vision Screening - Comments:: Last eye exam-06/2021-Dr. McCuen  Dietary issues and exercise activities discussed: Current Exercise Habits: Home exercise routine, Type of exercise: Other - see comments (dancing), Time (Minutes): 60, Frequency (Times/Week): 3, Weekly Exercise (Minutes/Week): 180, Intensity: Moderate, Exercise limited by: None identified   Goals Addressed             This Visit's Progress    DIET - INCREASE WATER INTAKE   On track    Maintain current healthy lifestyle.   On track      Depression Screen    09/28/2021   10:07 AM 05/27/2021    1:01 PM 08/21/2020    8:52 AM 07/17/2018    3:33 PM 08/12/2016    1:18 PM 09/10/2015    2:39 PM 06/03/2015   10:35 AM  PHQ 2/9 Scores  PHQ - 2 Score 0 0 6 0 0 0 0  PHQ- 9 Score  0 12      Exception Documentation      Patient refusal     Fall Risk    09/28/2021   10:05 AM 05/04/2021    9:00 AM 02/25/2020    9:35 AM 07/17/2018    3:33 PM 01/04/2018    8:57 AM  Fall Risk   Falls in the past year? 1 0 0 1 Yes  Number falls in past yr: 0 0 0 0 1  Injury with Fall? 0 0 0 1 Yes  Risk for fall due to : History of fall(s)      Follow up Falls prevention discussed        [place]:  Any stairs in or around the home? Yes  If so, are there any without handrails? No  Home free of loose throw rugs in walkways, pet beds, electrical cords, etc? Yes  Adequate lighting in your home to reduce risk of falls? Yes   ASSISTIVE DEVICES UTILIZED TO PREVENT FALLS:  Life alert? No  Use of a cane, walker or w/c? No  Grab bars in the bathroom? No  Shower chair  or bench in shower? No  Elevated toilet seat or a handicapped toilet? No   TIMED UP AND GO:  Was the test performed? No .phone visit    Cognitive Function:Normal cognitive status assessed by this Nurse Health Advisor. No abnormalities found.          Immunizations Immunization History  Administered Date(s) Administered   Fluad Quad(high Dose 65+) 02/28/2019   Influenza Split 01/15/2021   Influenza, High Dose Seasonal PF 01/04/2018   Influenza,inj,Quad PF,6+ Mos 01/02/2014, 01/22/2015   PFIZER Comirnaty(Gray Top)Covid-19 Tri-Sucrose Vaccine 08/21/2020   PFIZER(Purple Top)SARS-COV-2 Vaccination 05/25/2019, 06/19/2019, 01/15/2020   Pfizer Covid-19 Vaccine Bivalent Booster 32yr & up 01/15/2021   Pneumococcal Conjugate-13 01/22/2015   Pneumococcal Polysaccharide-23 03/23/2013   Td 02/18/2008   Tdap 10/31/2018    TDAP status: Up to date  Flu Vaccine status: Up to date  Pneumococcal vaccine status: Up to date  Covid-19 vaccine status: Completed vaccines  Qualifies for Shingles Vaccine?  Yes   Zostavax completed No   Shingrix Completed?: No.    Education has been provided regarding the importance of this vaccine. Patient has been advised to call insurance company to determine out of pocket expense if they have not yet received this vaccine. Advised may also receive vaccine at local pharmacy or Health Dept. Verbalized acceptance and understanding.  Screening Tests Health Maintenance  Topic Date Due   Zoster Vaccines- Shingrix (1 of 2) Never done   HEMOGLOBIN A1C  11/01/2021   INFLUENZA VACCINE  11/17/2021   FOOT EXAM  05/04/2022   OPHTHALMOLOGY EXAM  07/09/2022   MAMMOGRAM  02/12/2023   COLONOSCOPY (Pts 45-21yr Insurance coverage will need to be confirmed)  07/15/2025   TETANUS/TDAP  10/30/2028   Pneumonia Vaccine 76 Years old  Completed   DEXA SCAN  Completed   COVID-19 Vaccine  Completed   Hepatitis C Screening  Completed   HPV VACCINES  Aged Out    Health  Maintenance  Health Maintenance Due  Topic Date Due   Zoster Vaccines- Shingrix (1 of 2) Never done    Colorectal cancer screening: Type of screening: Colonoscopy. Completed 07/15/2020. Repeat every 5 years  Mammogram status: Completed bilateral 02/11/2021. Repeat every year  Bone Density status: Ordered today. Pt provided with contact info and advised to call to schedule appt.  Lung Cancer Screening: (Low Dose CT Chest recommended if Age 74-80years, 30 pack-year currently smoking OR have quit w/in 15years.) does not qualify.     Additional Screening:  Hepatitis C Screening:  Completed 03/23/2013  Vision Screening: Recommended annual ophthalmology exams for early detection of glaucoma and other disorders of the eye. Is the patient up to date with their annual eye exam?  Yes  Who is the provider or what is the name of the office in which the patient attends annual eye exams? Dr. MEllie Lunch   Dental Screening: Recommended annual dental exams for proper oral hygiene  Community Resource Referral / Chronic Care Management: CRR required this visit?  No   CCM required this visit?  No      Plan:     I have personally reviewed and noted the following in the patient's chart:   Medical and social history Use of alcohol, tobacco or illicit drugs  Current medications and supplements including opioid prescriptions.  Functional ability and status Nutritional status Physical activity Advanced directives List of other physicians Hospitalizations, surgeries, and ER visits in previous 12 months Vitals Screenings to include cognitive, depression, and falls Referrals and appointments  In addition, I have reviewed and discussed with patient certain preventive protocols, quality metrics, and best practice recommendations. A written personalized care plan for preventive services as well as general preventive health recommendations were provided to patient.   Due to this being a telephonic  visit, the after visit summary with patients personalized plan was offered to patient via mail or my-chart. Patient would like to access on my-chart.   MMarta Antu LPN   [id number] Nurse Health Advisor  Nurse Notes: None

## 2021-09-28 NOTE — Patient Instructions (Signed)
Monica Kane , Thank you for taking time to complete your Medicare Wellness Visit. I appreciate your ongoing commitment to your health goals. Please review the following plan we discussed and let me know if I can assist you in the future.   Screening recommendations/referrals: Colonoscopy: Completed 07/15/2020-Due 07/15/2025 Mammogram: Completed 02/11/2021-Due 02/11/2022 Bone Density: Ordered today. Someone will call you to schedule. Recommended yearly ophthalmology/optometry visit for glaucoma screening and checkup Recommended yearly dental visit for hygiene and checkup  Vaccinations: Influenza vaccine: Up to date Pneumococcal vaccine: Up to date Tdap vaccine: Up to date Shingles vaccine: Due-May obtain vaccine at your local pharmacy. Covid-19:Up to date  Advanced directives: Please bring a copy of Living Will and/or Healthcare Power of Attorney for your chart.   Conditions/risks identified: See problem list  Next appointment: Follow up in one year for your annual wellness visit    Preventive Care 65 Years and Older, Female Preventive care refers to lifestyle choices and visits with your health care provider that can promote health and wellness. What does preventive care include? A yearly physical exam. This is also called an annual well check. (74 years old) Dental exams once or twice a year. Routine eye exams. Ask your health care provider how often you should have your eyes checked. Personal lifestyle choices, including: Daily care of your teeth and gums. Regular physical activity. Eating a healthy diet. Avoiding tobacco and drug use. Limiting alcohol use. Practicing safe sex. Taking low-dose aspirin every day. Taking vitamin and mineral supplements as recommended by your health care provider. What happens during an annual well check? The services and screenings done by your health care provider during your annual well check will depend on your age, overall health, lifestyle risk factors,  and family history of disease. Counseling  Your health care provider may ask you questions about your: Alcohol use. Tobacco use. Drug use. Emotional well-being. Home and relationship well-being. Sexual activity. Eating habits. History of falls. Memory and ability to understand (cognition). Work and work Statistician. Reproductive health. Screening  You may have the following tests or measurements: Height, weight, and BMI. Blood pressure. Lipid and cholesterol levels. These may be checked every 5 years, or more frequently if you are over 74 years old. Skin check. Lung cancer screening. You may have this screening every year starting at age 74 if you have a 30-pack-year history of smoking and currently smoke or have quit within the past 15 years. Fecal occult blood test (FOBT) of the stool. You may have this test every year starting at age 74. Flexible sigmoidoscopy or colonoscopy. You may have a sigmoidoscopy every 5 years or a colonoscopy every 10 years starting at age 74. Hepatitis C blood test. Hepatitis B blood test. Sexually transmitted disease (STD) testing. Diabetes screening. This is done by checking your blood sugar (glucose) after you have not eaten for a while (fasting). You may have this done every 1-3 years. Bone density scan. This is done to screen for osteoporosis. You may have this done starting at age 74. Mammogram. This may be done every 1-2 years. Talk to your health care provider about how often you should have regular mammograms. Talk with your health care provider about your test results, treatment options, and if necessary, the need for more tests. Vaccines  Your health care provider may recommend certain vaccines, such as: Influenza vaccine. This is recommended every year. Tetanus, diphtheria, and acellular pertussis (Tdap, Td) vaccine. You may need a Td booster every 10 years. Zoster vaccine. You may need  this after age 74. Pneumococcal 13-valent conjugate  (PCV13) vaccine. One dose is recommended after age 74. Pneumococcal polysaccharide (PPSV23) vaccine. One dose is recommended after age 74. Talk to your health care provider about which screenings and vaccines you need and how often you need them. This information is not intended to replace advice given to you by your health care provider. Make sure you discuss any questions you have with your health care provider. Document Released: 05/02/2015 Document Revised: 12/24/2015 Document Reviewed: 02/04/2015 Elsevier Interactive Patient Education  2017 [place] Prevention in the Home Falls can cause injuries. They can happen to people of all ages. There are many things you can do to make your home safe and to help prevent falls. What can I do on the outside of my home? Regularly fix the edges of walkways and driveways and fix any cracks. Remove anything that might make you trip as you walk through a door, such as a raised step or threshold. Trim any bushes or trees on the path to your home. Use bright outdoor lighting. Clear any walking paths of anything that might make someone trip, such as rocks or tools. Regularly check to see if handrails are loose or broken. Make sure that both sides of any steps have handrails. Any raised decks and porches should have guardrails on the edges. Have any leaves, snow, or ice cleared regularly. Use sand or salt on walking paths during winter. Clean up any spills in your garage right away. This includes oil or grease spills. What can I do in the bathroom? Use night lights. Install grab bars by the toilet and in the tub and shower. Do not use towel bars as grab bars. Use non-skid mats or decals in the tub or shower. If you need to sit down in the shower, use a plastic, non-slip stool. Keep the floor dry. Clean up any water that spills on the floor as soon as it happens. Remove soap buildup in the tub or shower regularly. Attach bath mats securely with  double-sided non-slip rug tape. Do not have throw rugs and other things on the floor that can make you trip. What can I do in the bedroom? Use night lights. Make sure that you have a light by your bed that is easy to reach. Do not use any sheets or blankets that are too big for your bed. They should not hang down onto the floor. Have a firm chair that has side arms. You can use this for support while you get dressed. Do not have throw rugs and other things on the floor that can make you trip. What can I do in the kitchen? Clean up any spills right away. Avoid walking on wet floors. Keep items that you use a lot in easy-to-reach places. If you need to reach something above you, use a strong step stool that has a grab bar. Keep electrical cords out of the way. Do not use floor polish or wax that makes floors slippery. If you must use wax, use non-skid floor wax. Do not have throw rugs and other things on the floor that can make you trip. What can I do with my stairs? Do not leave any items on the stairs. Make sure that there are handrails on both sides of the stairs and use them. Fix handrails that are broken or loose. Make sure that handrails are as long as the stairways. Check any carpeting to make sure that it is firmly attached to  the stairs. Fix any carpet that is loose or worn. Avoid having throw rugs at the top or bottom of the stairs. If you do have throw rugs, attach them to the floor with carpet tape. Make sure that you have a light switch at the top of the stairs and the bottom of the stairs. If you do not have them, ask someone to add them for you. What else can I do to help prevent falls? Wear shoes that: Do not have high heels. Have rubber bottoms. Are comfortable and fit you well. Are closed at the toe. Do not wear sandals. If you use a stepladder: Make sure that it is fully opened. Do not climb a closed stepladder. Make sure that both sides of the stepladder are locked  into place. Ask someone to hold it for you, if possible. Clearly mark and make sure that you can see: Any grab bars or handrails. First and last steps. Where the edge of each step is. Use tools that help you move around (mobility aids) if they are needed. These include: Canes. Walkers. Scooters. Crutches. Turn on the lights when you go into a dark area. Replace any light bulbs as soon as they burn out. Set up your furniture so you have a clear path. Avoid moving your furniture around. If any of your floors are uneven, fix them. If there are any pets around you, be aware of where they are. Review your medicines with your doctor. Some medicines can make you feel dizzy. This can increase your chance of falling. Ask your doctor what other things that you can do to help prevent falls. This information is not intended to replace advice given to you by your health care provider. Make sure you discuss any questions you have with your health care provider. Document Released: 01/30/2009 Document Revised: 09/11/2015 Document Reviewed: 05/10/2014 Elsevier Interactive Patient Education  2017 [employer].

## 2021-10-15 ENCOUNTER — Ambulatory Visit (HOSPITAL_BASED_OUTPATIENT_CLINIC_OR_DEPARTMENT_OTHER)
Admission: RE | Admit: 2021-10-15 | Discharge: 2021-10-15 | Disposition: A | Discharge status: Home / Self Care | Payer: Medicare Other | Source: Ambulatory Visit | Attending: Family Medicine | Admitting: Family Medicine

## 2021-10-15 ENCOUNTER — Encounter: Payer: Self-pay | Admitting: Family Medicine

## 2021-10-15 DIAGNOSIS — Z780 Asymptomatic menopausal state: Secondary | ICD-10-CM | POA: Diagnosis not present

## 2021-10-15 DIAGNOSIS — M85852 Other specified disorders of bone density and structure, left thigh: Secondary | ICD-10-CM | POA: Diagnosis not present

## 2021-10-15 DIAGNOSIS — M8580 Other specified disorders of bone density and structure, unspecified site: Secondary | ICD-10-CM

## 2021-10-15 MED ORDER — ALENDRONATE SODIUM 70 MG PO TABS: 70.0000 mg | ORAL_TABLET | ORAL | 3 refills | Status: DC

## 2021-10-27 ENCOUNTER — Encounter: Payer: Self-pay | Admitting: *Deleted

## 2021-10-27 ENCOUNTER — Encounter: Payer: Self-pay | Admitting: Hematology & Oncology

## 2021-10-27 ENCOUNTER — Inpatient Hospital Stay: Payer: Medicare Other | Attending: Hematology & Oncology

## 2021-10-27 ENCOUNTER — Inpatient Hospital Stay (HOSPITAL_BASED_OUTPATIENT_CLINIC_OR_DEPARTMENT_OTHER): Payer: Medicare Other | Admitting: Hematology & Oncology

## 2021-10-27 VITALS — BP 128/60 | HR 65 | Temp 98.1°F | Resp 18 | Ht 66.0 in | Wt 158.0 lb

## 2021-10-27 DIAGNOSIS — I2699 Other pulmonary embolism without acute cor pulmonale: Secondary | ICD-10-CM | POA: Diagnosis not present

## 2021-10-27 DIAGNOSIS — I2602 Saddle embolus of pulmonary artery with acute cor pulmonale: Secondary | ICD-10-CM

## 2021-10-27 DIAGNOSIS — Z7901 Long term (current) use of anticoagulants: Secondary | ICD-10-CM | POA: Insufficient documentation

## 2021-10-27 DIAGNOSIS — I2694 Multiple subsegmental pulmonary emboli without acute cor pulmonale: Secondary | ICD-10-CM | POA: Diagnosis not present

## 2021-10-27 LAB — CBC WITH DIFFERENTIAL (CANCER CENTER ONLY)
Abs Immature Granulocytes: 0.02 K/uL (ref 0.00–0.07)
Basophils Absolute: 0.1 K/uL (ref 0.0–0.1)
Basophils Relative: 1 %
Eosinophils Absolute: 0.1 K/uL (ref 0.0–0.5)
Eosinophils Relative: 2 %
HCT: 41.6 % (ref 36.0–46.0)
Hemoglobin: 13.2 g/dL (ref 12.0–15.0)
Immature Granulocytes: 0 %
Lymphocytes Relative: 26 %
Lymphs Abs: 1.6 K/uL (ref 0.7–4.0)
MCH: 25.8 pg — ABNORMAL LOW (ref 26.0–34.0)
MCHC: 31.7 g/dL (ref 30.0–36.0)
MCV: 81.4 fL (ref 80.0–100.0)
Monocytes Absolute: 0.3 K/uL (ref 0.1–1.0)
Monocytes Relative: 6 %
Neutro Abs: 3.9 K/uL (ref 1.7–7.7)
Neutrophils Relative %: 65 %
Platelet Count: 188 K/uL (ref 150–400)
RBC: 5.11 MIL/uL (ref 3.87–5.11)
RDW: 13.4 % (ref 11.5–15.5)
WBC Count: 5.9 K/uL (ref 4.0–10.5)
nRBC: 0 % (ref 0.0–0.2)

## 2021-10-27 LAB — CMP (CANCER CENTER ONLY)
ALT: 13 U/L (ref 0–44)
AST: 16 U/L (ref 15–41)
Albumin: 4.8 g/dL (ref 3.5–5.0)
Alkaline Phosphatase: 59 U/L (ref 38–126)
Anion gap: 8 (ref 5–15)
BUN: 23 mg/dL (ref 8–23)
CO2: 25 mmol/L (ref 22–32)
Calcium: 9.9 mg/dL (ref 8.9–10.3)
Chloride: 105 mmol/L (ref 98–111)
Creatinine: 1.13 mg/dL — ABNORMAL HIGH (ref 0.44–1.00)
GFR, Estimated: 51 mL/min — ABNORMAL LOW (ref >60)
Glucose, Bld: 121 mg/dL — ABNORMAL HIGH (ref 70–99)
Potassium: 4.2 mmol/L (ref 3.5–5.1)
Sodium: 138 mmol/L (ref 135–145)
Total Bilirubin: 0.6 mg/dL (ref 0.3–1.2)
Total Protein: 6.7 g/dL (ref 6.5–8.1)

## 2021-10-27 LAB — D-DIMER, QUANTITATIVE: D-Dimer, Quant: <0.27 ug{FEU}/mL (ref 0.00–0.50)

## 2021-10-27 NOTE — Progress Notes (Signed)
Hematology and Oncology Follow Up Visit  Monica Kane [id number] 21-Mar-1946 76 y.o. 10/27/2021   Principle Diagnosis:  Pulmonary embolism-bilateral-idiopathic  Current Therapy:   Xarelto 20 mg p.o. daily-started on 05/24/2021     Interim History:  [name] is back for follow-up.  She got back from [place].  She had a wonderful time in [place].  She is now headed over to [place] for a international dance competition.  I am sure that she and her husband will both do very well.  She feels okay.  She has a little bit of sleepiness.  I do not think this is from the [place].  There is no chest pain.  She has had no cough.  There is no bleeding.  There is no fever.  She has had no problems with nausea or vomiting.  She is eating okay.  There is no change in bowel or bladder habits.  She has had no leg swelling.  There is no leg pain.  Overall, I would say her performance status is ECOG 0.     Medications:  Current Outpatient Medications:    amitriptyline (ELAVIL) 25 MG tablet, TAKE ONE TABLET BY MOUTH AT BEDTIME, Disp: 90 tablet, Rfl: 1   carvedilol (COREG) 3.125 MG tablet, Take 3.125 mg by mouth at bedtime., Disp: , Rfl:    lisinopril (ZESTRIL) 40 MG tablet, Take 1 tablet (40 mg total) by mouth daily., Disp: 90 tablet, Rfl: 3   metFORMIN (GLUCOPHAGE) 500 MG tablet, TAKE ONE TABLET BY MOUTH DAILY. MAY START WITH 1/2 TAB AND INCREASE AS TOLERATED, Disp: 90 tablet, Rfl: 1   rivaroxaban (XARELTO) 20 MG TABS tablet, Take 1 tablet (20 mg total) by mouth daily with supper., Disp: 90 tablet, Rfl: 1   rosuvastatin (CRESTOR) 10 MG tablet, TAKE ONE TABLET BY MOUTH DAILY, Disp: 90 tablet, Rfl: 1   sertraline (ZOLOFT) 100 MG tablet, TAKE 2 & 1/2 TABLETS DAILY., Disp: 225 tablet, Rfl: 3   traZODone (DESYREL) 50 MG tablet, TAKE 1 TO 2 TABLETS AT BEDTIME, Disp: 180 tablet, Rfl: 1   alendronate (FOSAMAX) 70 MG tablet, Take 1 tablet (70 mg total) by mouth every 7 (seven) days. Take with a full glass of  water on an empty stomach. (Patient not taking: Reported on 10/27/2021), Disp: 12 tablet, Rfl: 3   carvedilol (COREG) 3.125 MG tablet, TAKE 1 TABLET BY MOUTH TWICE DAILY WITH A MEAL. (Patient taking differently: at bedtime.), Disp: 180 tablet, Rfl: 2  Allergies:  Allergies  Allergen Reactions   Penicillins Swelling and Rash    Facial swelling    Past Medical History, Surgical history, Social history, and Family History were reviewed and updated.  Review of Systems: Review of Systems  Constitutional: Negative.   HENT:  Negative.    Eyes: Negative.   Respiratory: Negative.    Cardiovascular: Negative.   Gastrointestinal: Negative.   Endocrine: Negative.   Genitourinary: Negative.    Musculoskeletal: Negative.   Skin: Negative.   Neurological: Negative.   Hematological: Negative.   Psychiatric/Behavioral: Negative.      Physical Exam:  height is '5\' 6"'$  (1.676 m) and weight is 158 lb (71.7 kg). Her oral temperature is 98.1 F (36.7 C). Her blood pressure is 128/60 and her pulse is 65. Her respiration is 18 and oxygen saturation is 97%.   Wt Readings from Last 3 Encounters:  10/27/21 158 lb (71.7 kg)  09/28/21 160 lb (72.6 kg)  08/05/21 167 lb (75.8 kg)    Physical Exam  Vitals reviewed.  HENT:     Head: Normocephalic and atraumatic.  Eyes:     Pupils: Pupils are equal, round, and reactive to light.  Cardiovascular:     Rate and Rhythm: Normal rate and regular rhythm.     Heart sounds: Normal heart sounds.  Pulmonary:     Effort: Pulmonary effort is normal.     Breath sounds: Normal breath sounds.  Abdominal:     General: Bowel sounds are normal.     Palpations: Abdomen is soft.  Musculoskeletal:        General: No tenderness or deformity. Normal range of motion.     Cervical back: Normal range of motion.  Lymphadenopathy:     Cervical: No cervical adenopathy.  Skin:    General: Skin is warm and dry.     Findings: No erythema or rash.  Neurological:     Mental  Status: She is alert and oriented to person, place, and time.  Psychiatric:        Behavior: Behavior normal.        Thought Content: Thought content normal.        Judgment: Judgment normal.      Lab Results  Component Value Date   WBC 5.9 10/27/2021   HGB 13.2 10/27/2021   HCT 41.6 10/27/2021   MCV 81.4 10/27/2021   PLT 188 10/27/2021     Chemistry      Component Value Date/Time   NA 138 10/27/2021 1015   K 4.2 10/27/2021 1015   CL 105 10/27/2021 1015   CO2 25 10/27/2021 1015   BUN 23 10/27/2021 1015   CREATININE 1.13 (H) 10/27/2021 1015   CREATININE 0.81 08/12/2016 1348      Component Value Date/Time   CALCIUM 9.9 10/27/2021 1015   ALKPHOS 59 10/27/2021 1015   AST 16 10/27/2021 1015   ALT 13 10/27/2021 1015   BILITOT 0.6 10/27/2021 1015      Impression and Plan: [name] is a very nice 76 year old white female.  She has bilateral pulmonary emboli.  This is idiopathic from all of her studies to date.  Thankfully, she did not have a high clot burden.  She still will need full anticoagulation for 2 years.  After that, I would then put her on maintenance anticoagulation.  Again, I see no problem with her going over to [place] for the dance competition.  I told that she must stay hydrated.  Her creatinine was up a little bit.  We will plan to get her back now in November.  I think we can start moving her appointments out a little bit longer.    Volanda Napoleon, MD 7/11/202311:05 AM

## 2021-11-23 ENCOUNTER — Encounter: Payer: Self-pay | Admitting: Family Medicine

## 2021-12-03 NOTE — Progress Notes (Signed)
[facility] [address] [place], [place] [address] [phone]  Date:  12/07/2021   Name:  Monica Kane   DOB:  Apr 23, 1945   MRN:  [id number]  PCP:  Darreld Mclean, MD    Chief Complaint: No chief complaint on file.   History of Present Illness:  Monica Kane is a 76 y.o. very pleasant female patient who presents with the following:  Virtual visit today to discuss concern of bone density Patient location is home, location is office.  Patient identity confirmed with 2 factors, she gives consent for virtual visit today.  The patient and myself are present on the call today  Most recent visit with myself was in February of this year-at that time she had recently been diagnosed with a pulmonary embolism, we started Xarelto.  This was her first PE-she is consulted with hematology; exact cause of clot is uncertain, we wonder if she had COVID-19 although she never tested positive.  Plan is for full dose of Xarelto for 1 year, then prophylactic dose for another year Also history of hypertension, prediabetes hyperlipidemia, sleep apnea treated with oral appliance, vitamin D deficiency  During her evaluation for pulmonary embolism there was some concern about her pancreas; we got a follow-up abdominal MRI in April-59-monthfollow-up recommended for possible precancerous pancreatic lesion Patient is aware of need for follow-up CT, it has been ordered  Most recent DEXA scan in June of this year, T score -1.6 but fracture risk is elevated: Hip fracture risk 7.9%.  We plan to start her on treatment with Fosamax SSummerlynnwanted this visit today to further discuss her bone density scan and make sure that she understands her situation She feels comfortable starting on Fosamax, she is aware of potential side effects Patient Active Problem List   Diagnosis Date Noted   Pulmonary embolism ([place] 07/08/2021   Acquired claw toe, right     Achilles tendon contracture, right    OSA (obstructive sleep apnea) 07/26/2019   Vertigo 10/03/2018   Visual blurriness 10/03/2018   Controlled type 2 diabetes mellitus without complication, without long-term current use of insulin ([facility] 06/05/2015   Primary localized osteoarthritis of right knee    Anxiety    Chest pain, midsternal 07/23/2013   Osteopenia 03/23/2013   Kidney stones, calcium oxalate 03/23/2013   Hypertension 03/31/2011   Hyperlipidemia 03/31/2011   Vitamin D deficiency 03/31/2011   Hallux rigidus 03/24/2011   Metatarsalgia of left foot 03/24/2011   Lateral epicondylitis of left elbow 02/24/2011   Migraine 02/24/2011   Depression 02/24/2011   Arthritis 02/24/2011    Past Medical History:  Diagnosis Date   Anxiety    takes Ativan nightly   Depression    takes Zoloft daily   History of colon polyps    History of kidney stones    History of staph infection 11/2013   Hyperlipidemia    takes Crestor nightly    Hypertension    takes Lisinopril and Coreg daily   Joint pain    Joint swelling    Neuropathy    takes Elavil nightly   Pneumonia    Pre-diabetes    Primary localized osteoarthritis of right knee    Pulmonary embolism ([facility] 07/08/2021   Sleep apnea    uses oral appliance    Past Surgical History:  Procedure Laterality Date   [place]  CATHETERIZATION  2015   CERVICAL SPINE SURGERY  2004   C 5-6   COLONOSCOPY     colonscopy     DIAGNOSTIC LAPAROSCOPY  1976   fibroid removed   ELBOW ARTHROSCOPY  2010, 2012   EYE SURGERY Bilateral    cataract   FOOT SURGERY Left 2013   GASTROCNEMIUS RECESSION Right 10/09/2019   Procedure: RIGHT GASTROCNEMIUS RECESSION;  Surgeon: Newt Minion, MD;  Location: [facility];  Service: Orthopedics;  Laterality: Right;   KNEE ARTHROSCOPY  Oct 2014   KNEE ARTHROSCOPY W/ MENISCECTOMY Right 01/31/2013   [place]   LATERAL EPICONDYLE RELEASE  03/01/2011    Procedure: TENNIS ELBOW RELEASE;  Surgeon: Lorn Junes, MD;  Location: [facility];  Service: Orthopedics;  Laterality: Left;  TENOTOMY ELBOW LATERAL EPICONDYLITIS TENNIS ELBOW   lateral release right elbow  2010   left foot surgery  2013   mortons neuroma   LEFT HEART CATHETERIZATION WITH CORONARY ANGIOGRAM N/A 07/23/2013   Procedure: LEFT HEART CATHETERIZATION WITH CORONARY ANGIOGRAM;  Surgeon: Sinclair Grooms, MD;  [facility];  Service: Cardiovascular;  Laterality: N/A;   PAROTID GLAND TUMOR EXCISION  1976   benign   PAROTID GLAND TUMOR EXCISION  1976   rt   SHOULDER ARTHROSCOPY DISTAL CLAVICLE EXCISION AND OPEN ROTATOR CUFF REPAIR  2006   SHOULDER ARTHROSCOPY W/ ROTATOR CUFF REPAIR  2006   TOTAL KNEE ARTHROPLASTY Right 02/18/2014   Procedure: TOTAL KNEE ARTHROPLASTY;  Surgeon: Lorn Junes, MD;  Location: [facility];  Service: Orthopedics;  Laterality: Right;   WEIL OSTEOTOMY Right 10/09/2019   Procedure: WEIL OSTEOTOMY RIGHT 2ND METATARSAL;  Surgeon: Newt Minion, MD;  Location: [facility];  Service: Orthopedics;  Laterality: Right;    Social History   Tobacco Use   Smoking status: Never   Smokeless tobacco: Never  Vaping Use   Vaping Use: Never used  Substance Use Topics   Alcohol use: Yes    Comment: social   Drug use: No    Family History  Problem Relation Age of Onset   Cancer Mother        lung   Cancer Father        lung; + tobacco   Heart disease Maternal Grandfather        heart attack   Heart disease Paternal Grandfather        heart attack   Diabetes Sister    Hypertension Sister    Hyperlipidemia Sister    Breast cancer Neg Hx     Allergies  Allergen Reactions   Penicillins Swelling and Rash    Facial swelling    Medication list has been reviewed and updated.  Current Outpatient Medications on File Prior to Visit  Medication Sig Dispense Refill   alendronate (FOSAMAX) 70 MG tablet Take 1 tablet (70 mg  total) by mouth every 7 (seven) days. Take with a full glass of water on an empty stomach. (Patient not taking: Reported on 10/27/2021) 12 tablet 3   amitriptyline (ELAVIL) 25 MG tablet TAKE ONE TABLET BY MOUTH AT BEDTIME 90 tablet 1   carvedilol (COREG) 3.125 MG tablet TAKE 1 TABLET BY MOUTH TWICE DAILY WITH A MEAL. (Patient taking differently: at bedtime.) 180 tablet 2   carvedilol (COREG) 3.125 MG tablet Take 3.125 mg by mouth at bedtime.     lisinopril (ZESTRIL) 40 MG tablet Take 1 tablet (40 mg total) by mouth daily. 90 tablet  3   metFORMIN (GLUCOPHAGE) 500 MG tablet TAKE ONE TABLET BY MOUTH DAILY. MAY START WITH 1/2 TAB AND INCREASE AS TOLERATED 90 tablet 1   rivaroxaban (XARELTO) 20 MG TABS tablet Take 1 tablet (20 mg total) by mouth daily with supper. 90 tablet 1   rosuvastatin (CRESTOR) 10 MG tablet TAKE ONE TABLET BY MOUTH DAILY 90 tablet 1   sertraline (ZOLOFT) 100 MG tablet TAKE 2 & 1/2 TABLETS DAILY. 225 tablet 3   traZODone (DESYREL) 50 MG tablet TAKE 1 TO 2 TABLETS AT BEDTIME 180 tablet 1   No current facility-administered medications on file prior to visit.    Review of Systems:  As per HPI- otherwise negative.   Physical Examination: There were no vitals filed for this visit. There were no vitals filed for this visit. There is no height or weight on file to calculate BMI. Ideal Body Weight:    Connected with patient via video monitor.  She looks well, her normal self.  No distress is noted  Assessment and Plan: Osteopenia, unspecified location  We discussed her recent bone density scan.  T score shows osteopenia but hip fracture risk is increased, thus we recommend treatment.  She feels comfortable starting on Fosamax, we discussed most common side effects and she will let me know if any problems come up.  Otherwise she plans to start her medication ASAP  Signed Lamar Blinks, MD

## 2021-12-05 ENCOUNTER — Other Ambulatory Visit: Payer: Self-pay | Admitting: Family Medicine

## 2021-12-07 ENCOUNTER — Telehealth (INDEPENDENT_AMBULATORY_CARE_PROVIDER_SITE_OTHER): Payer: Medicare Other | Admitting: Family Medicine

## 2021-12-07 ENCOUNTER — Encounter: Payer: Self-pay | Admitting: Family Medicine

## 2021-12-07 DIAGNOSIS — M8580 Other specified disorders of bone density and structure, unspecified site: Secondary | ICD-10-CM

## 2021-12-07 DIAGNOSIS — I6312 Cerebral infarction due to embolism of basilar artery: Secondary | ICD-10-CM

## 2021-12-22 ENCOUNTER — Encounter (INDEPENDENT_AMBULATORY_CARE_PROVIDER_SITE_OTHER): Payer: Medicare Other | Admitting: Family Medicine

## 2021-12-22 DIAGNOSIS — U071 COVID-19: Secondary | ICD-10-CM

## 2021-12-22 MED ORDER — MOLNUPIRAVIR EUA 200MG CAPSULE: 4.0000 | ORAL_CAPSULE | Freq: Two times a day (BID) | ORAL | 0 refills | Status: AC

## 2021-12-22 MED ORDER — MOLNUPIRAVIR EUA 200MG CAPSULE: 4.0000 | ORAL_CAPSULE | Freq: Two times a day (BID) | ORAL | 0 refills | Status: DC

## 2021-12-22 NOTE — Addendum Note (Signed)
Addended by: Lamar Blinks C on: 12/22/2021 02:26 PM   Modules accepted: Orders

## 2021-12-22 NOTE — Telephone Encounter (Signed)
Please see the MyChart message reply(ies) for my assessment and plan.  The patient gave consent for this Medical Advice Message and is aware that it may result in a bill to their insurance company as well as the possibility that this may result in a co-payment or deductible. They are an established patient, but are not seeking medical advice exclusively about a problem treated during an in person or video visit in the last 7 days. I did not recommend an in person or video visit within 7 days of my reply.  I spent a total of 8 minutes cumulative time within 7 days through MyChart messaging Brogan Martis, MD  

## 2021-12-27 ENCOUNTER — Other Ambulatory Visit: Payer: Self-pay | Admitting: Family Medicine

## 2021-12-27 DIAGNOSIS — G4700 Insomnia, unspecified: Secondary | ICD-10-CM

## 2021-12-27 DIAGNOSIS — I2699 Other pulmonary embolism without acute cor pulmonale: Secondary | ICD-10-CM

## 2021-12-27 DIAGNOSIS — E785 Hyperlipidemia, unspecified: Secondary | ICD-10-CM

## 2021-12-28 ENCOUNTER — Ambulatory Visit: Payer: Medicare Other | Admitting: Family Medicine

## 2021-12-29 ENCOUNTER — Encounter: Payer: Self-pay | Admitting: Family Medicine

## 2021-12-29 ENCOUNTER — Telehealth: Payer: Self-pay | Admitting: Family Medicine

## 2021-12-29 DIAGNOSIS — R1900 Intra-abdominal and pelvic swelling, mass and lump, unspecified site: Secondary | ICD-10-CM

## 2021-12-29 NOTE — Telephone Encounter (Signed)
Message from [facility]- pt is scheduled for a pancreatic CT tomorrow, but it looks like an MRI would be more ideal based on her MRI from 08/11/21 IMPRESSION: 1. No acute findings in the abdomen. 2. Stable cystic areas in the pancreas, largest measuring up to 2.5 cm in the head of the pancreas. No main duct dilation or signs of pancreatic inflammation. Imaging characteristics are most suggestive of side branch intraductal papillary mucinous neoplasms. Initial follow-up at 6 months is suggested based on size of the largest area, no current evidence of high-risk features. 3. Mild hepatic steatosis.    The CT order was actually placed back in February after she had a CT abd- IMPRESSION: There few small ill-defined low-density foci in the head, body and tail of pancreas each measuring less than 11 mm. There is slight prominence of pancreatic duct. Findings suggest possible benign pancreatic cysts or cystic neoplasm. There are no imaging signs of acute pancreatitis. Short-term follow-up multiphasic CT or MRI in 2-3 months may be considered. Pt said her imaging was scheduled and I did not realize this was the CT and not the MRI that she needs Will order MRI now and reach out to patient with this update.

## 2021-12-30 ENCOUNTER — Inpatient Hospital Stay: Admission: RE | Admit: 2021-12-30 | Payer: Medicare Other | Source: Ambulatory Visit

## 2022-01-05 IMAGING — MG DIGITAL SCREENING BILAT W/ TOMO W/ CAD
8 series · 8 of 24 positions shown · non-contrast
Comparison: Previous exam(s).

CLINICAL DATA: Screening.

EXAM:
DIGITAL SCREENING BILATERAL MAMMOGRAM WITH TOMO AND CAD

[R CC synth-2D]
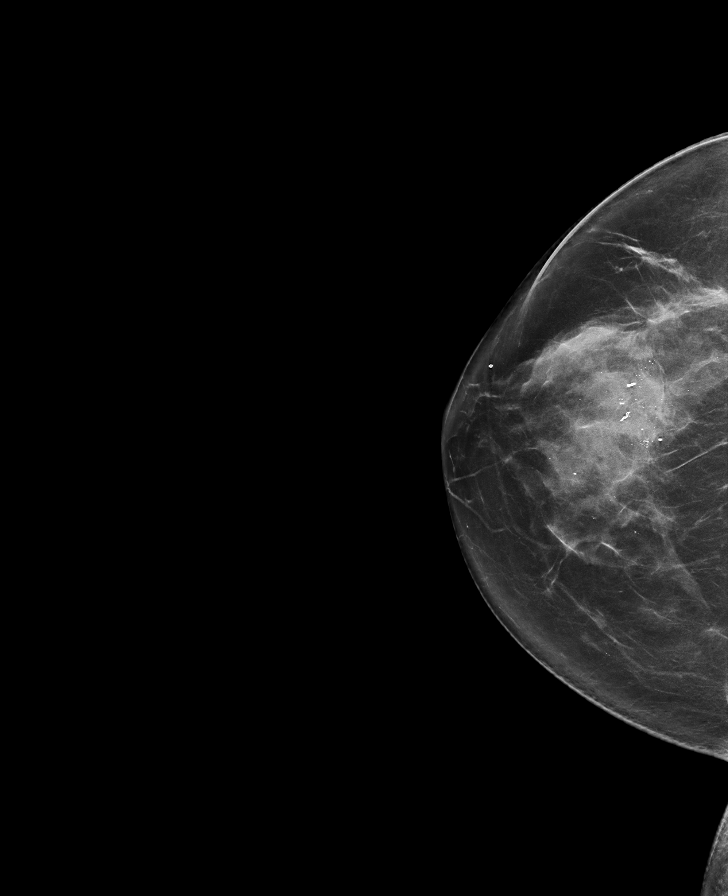

[L CC synth-2D]
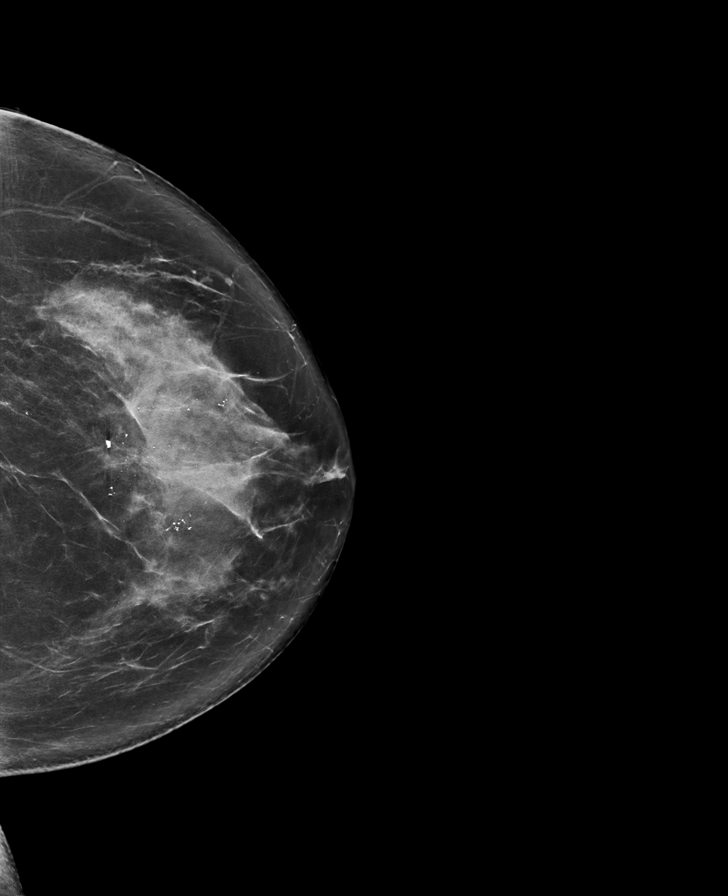

[L MLO synth-2D]
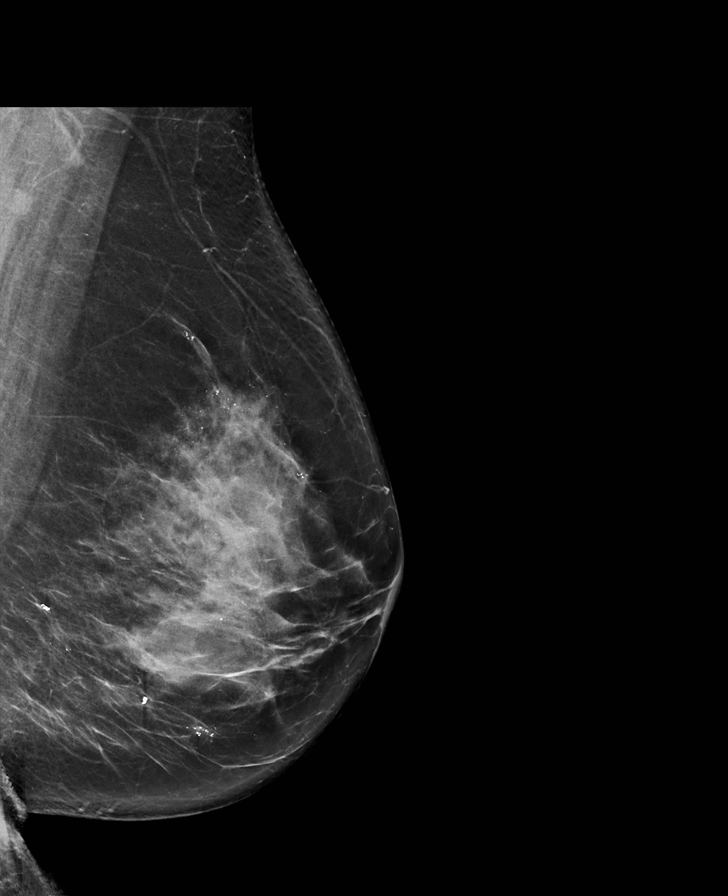

[R MLO synth-2D]
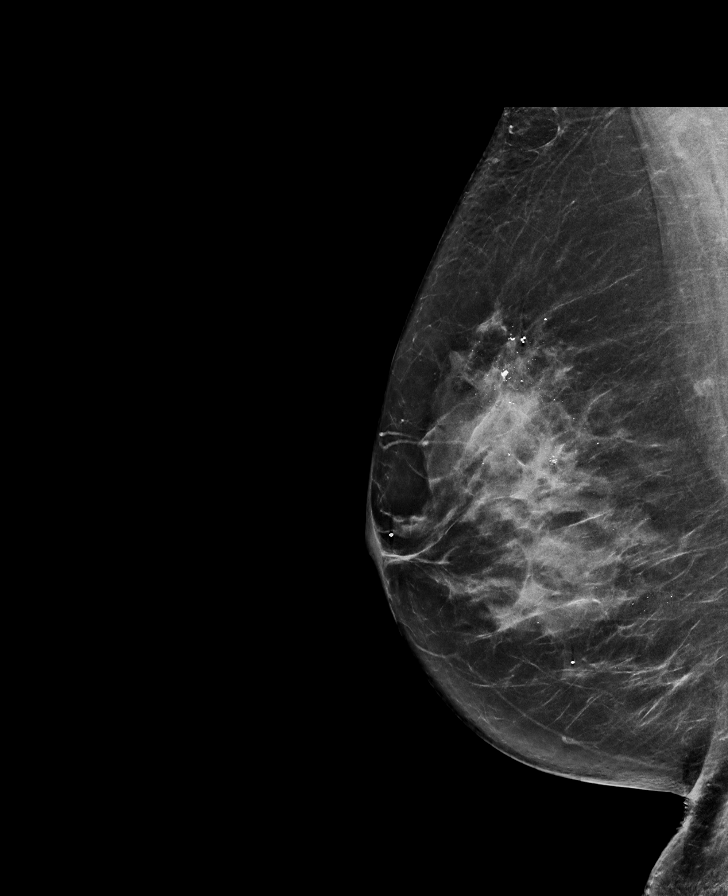

[R CC tomo · tomo slice 41/80.0]
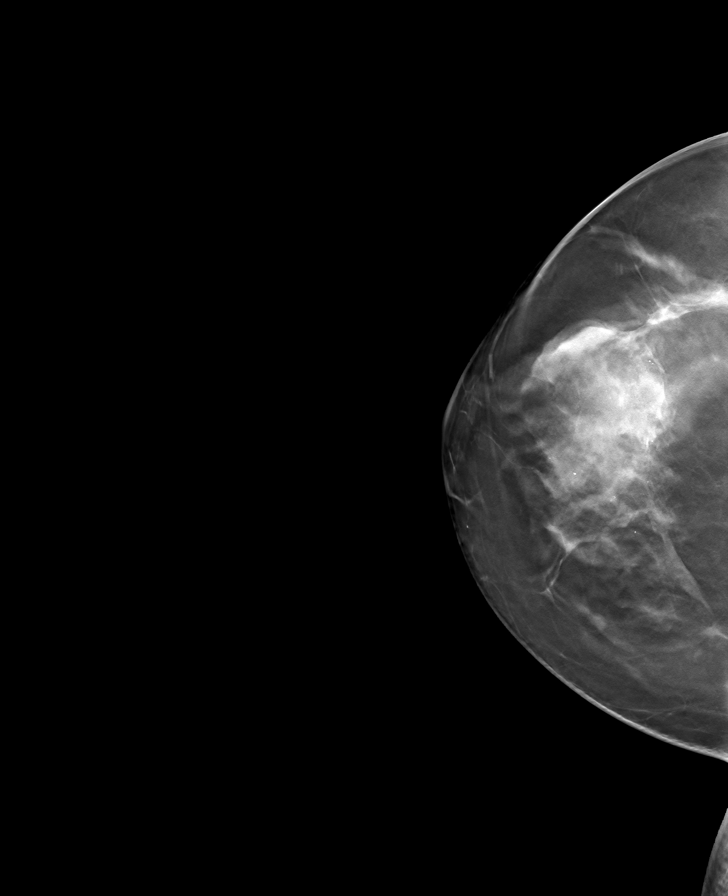

[L CC tomo · tomo slice 43/85.0]
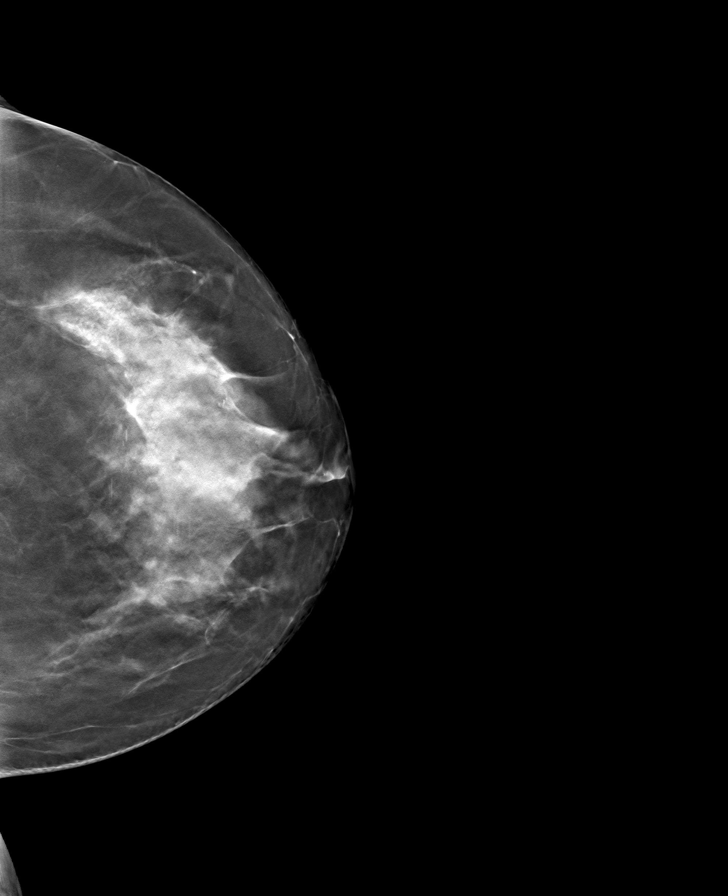

[R MLO tomo · tomo slice 39/78.0]
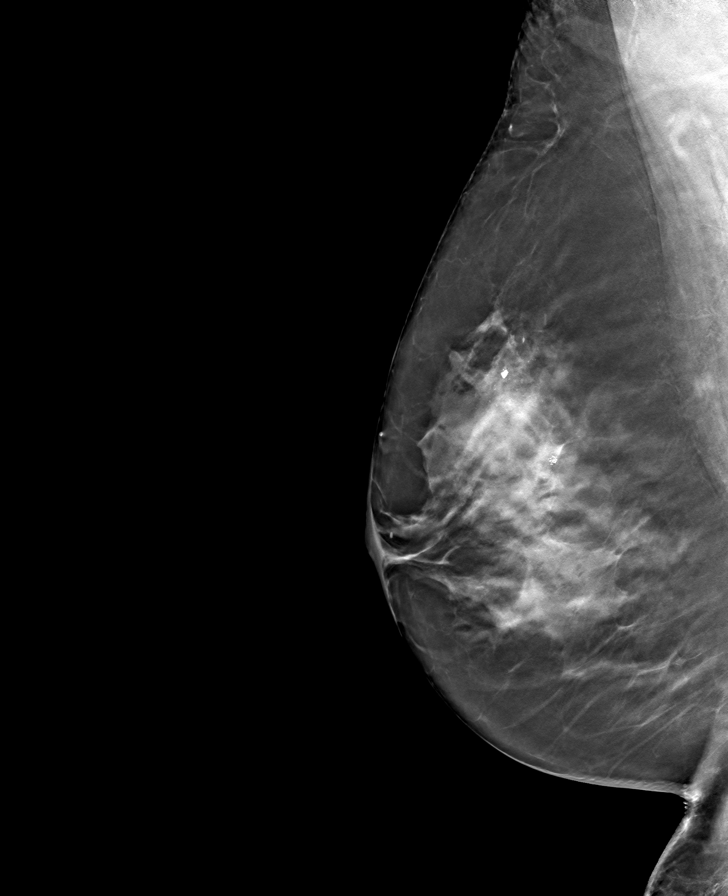

[L MLO tomo · tomo slice 43/85.0]
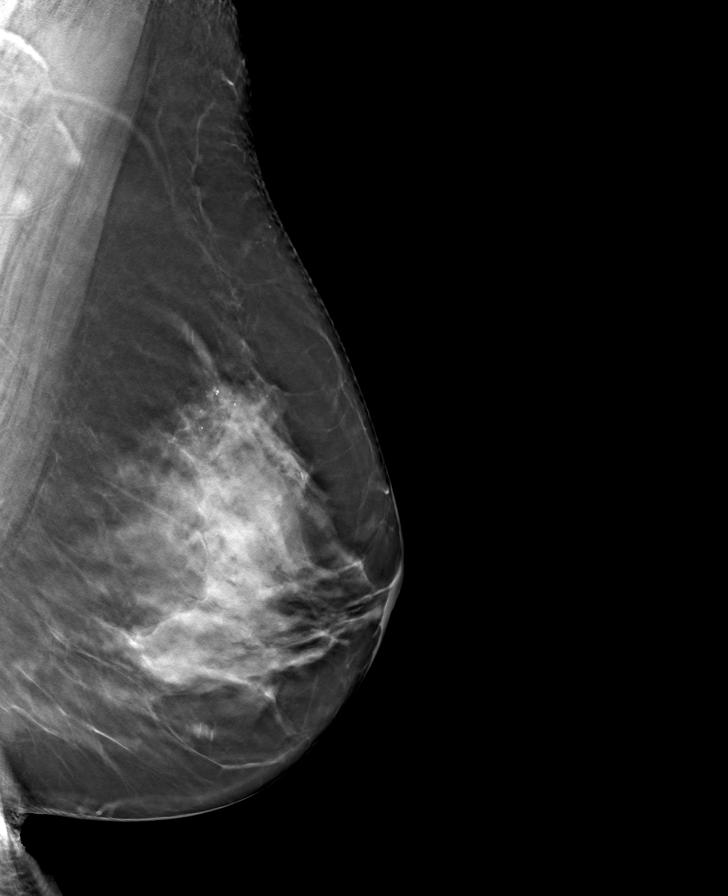

[8 of 24 positions shown; findings below may reference images not displayed]

ACR Breast Density Category c: The breast tissue is heterogeneously
dense, which may obscure small masses.
FINDINGS: There are no findings suspicious for malignancy. Images were
processed with CAD.
IMPRESSION: No mammographic evidence of malignancy. A result letter of this
screening mammogram will be mailed directly to the patient.

RECOMMENDATION:
Screening mammogram in one year. (Code:[id number]-[id number])

BI-RADS CATEGORY  1: Negative.

## 2022-01-06 NOTE — Progress Notes (Unsigned)
[facility] [address] [place], [place] [address] [phone]  Date:  01/07/2022   Name:  Monica Kane   DOB:  12/25/1945   MRN:  [id number]  PCP:  Monica Mclean, MD    Chief Complaint: covid follow up (Pt was covid positive 12/22/21. Diarrhea that started last night- extremely watery. She has also still has some nasal congestion and cough. )   History of Present Illness:  Monica Kane is a 76 y.o. very pleasant female patient who presents with the following:  Patient seen today with concern of nasal congestion and cough History of hypertension, prediabetes hyperlipidemia, sleep apnea treated with oral appliance, vitamin D deficiency In February of this year she also tested positive for pulmonary embolism in the setting of acute illness, suspected COVID though she tested negative.  Plan to treat with Xarelto for 1 year  Most recent visit with myself was a virtual visit in August She had COVID-19 earlier this month, tested positive around 9/5 -I called in molnupiravir for her, we did not use Paxlovid because she is on Xarelto  She was getting better steadily but then seemed to relapse 2 days ago  Pt notes she had diarrhea when covid started- it got better but then came back last night at 2:30 am and was watery all night.  Somewhat better today but she is still had a few stools today No blood in her stool The stool is grayish brown No vomiting She will cough is she takes a deep breath still-this also got worse a couple days ago No fever but she may get a temperature of about 99.5 in the evening  She noted some mild LLQ pain on the way here today  No urinary sx  Her nose is stuffy and runny  Never had diverticulitis  Her husband is not ill   Patient Active Problem List   Diagnosis Date Noted   Pulmonary embolism ([place]) 07/08/2021   Acquired claw toe, right    Achilles tendon contracture, right    OSA  (obstructive sleep apnea) 07/26/2019   Vertigo 10/03/2018   Visual blurriness 10/03/2018   Controlled type 2 diabetes mellitus without complication, without long-term current use of insulin ([place]) 06/05/2015   Primary localized osteoarthritis of right knee    Anxiety    Chest pain, midsternal 07/23/2013   Osteopenia 03/23/2013   Kidney stones, calcium oxalate 03/23/2013   Hypertension 03/31/2011   Hyperlipidemia 03/31/2011   Vitamin D deficiency 03/31/2011   Hallux rigidus 03/24/2011   Metatarsalgia of left foot 03/24/2011   Lateral epicondylitis of left elbow 02/24/2011   Migraine 02/24/2011   Depression 02/24/2011   Arthritis 02/24/2011    Past Medical History:  Diagnosis Date   Anxiety    takes Ativan nightly   Depression    takes Zoloft daily   History of colon polyps    History of kidney stones    History of staph infection 11/2013   Hyperlipidemia    takes Crestor nightly    Hypertension    takes Lisinopril and Coreg daily   Joint pain    Joint swelling    Neuropathy    takes Elavil nightly   Pneumonia    Pre-diabetes    Primary localized osteoarthritis of right knee    Pulmonary embolism ([place]) 07/08/2021   Sleep apnea    uses oral appliance    Past Surgical History:  Procedure Laterality  Date   ABDOMINAL HYSTERECTOMY  1989   APPENDECTOMY     CARDIAC CATHETERIZATION  2015   CERVICAL SPINE SURGERY  2004   C 5-6   COLONOSCOPY     colonscopy     DIAGNOSTIC LAPAROSCOPY  1976   fibroid removed   ELBOW ARTHROSCOPY  2010, 2012   EYE SURGERY Bilateral    cataract   FOOT SURGERY Left 2013   GASTROCNEMIUS RECESSION Right 10/09/2019   Procedure: RIGHT GASTROCNEMIUS RECESSION;  Surgeon: Newt Minion, MD;  Location: [facility];  Service: Orthopedics;  Laterality: Right;   KNEE ARTHROSCOPY  Oct 2014   KNEE ARTHROSCOPY W/ MENISCECTOMY Right 01/31/2013   [place]   LATERAL EPICONDYLE RELEASE  03/01/2011   Procedure: TENNIS ELBOW RELEASE;  Surgeon:  Lorn Junes, MD;  Location: [facility];  Service: Orthopedics;  Laterality: Left;  TENOTOMY ELBOW LATERAL EPICONDYLITIS TENNIS ELBOW   lateral release right elbow  2010   left foot surgery  2013   mortons neuroma   LEFT HEART CATHETERIZATION WITH CORONARY ANGIOGRAM N/A 07/23/2013   Procedure: LEFT HEART CATHETERIZATION WITH CORONARY ANGIOGRAM;  Surgeon: Sinclair Grooms, MD;  [facility];  Service: Cardiovascular;  Laterality: N/A;   PAROTID GLAND TUMOR EXCISION  1976   benign   PAROTID GLAND TUMOR EXCISION  1976   rt   SHOULDER ARTHROSCOPY DISTAL CLAVICLE EXCISION AND OPEN ROTATOR CUFF REPAIR  2006   SHOULDER ARTHROSCOPY W/ ROTATOR CUFF REPAIR  2006   TOTAL KNEE ARTHROPLASTY Right 02/18/2014   Procedure: TOTAL KNEE ARTHROPLASTY;  Surgeon: Lorn Junes, MD;  Location: [facility];  Service: Orthopedics;  Laterality: Right;   WEIL OSTEOTOMY Right 10/09/2019   Procedure: WEIL OSTEOTOMY RIGHT 2ND METATARSAL;  Surgeon: Newt Minion, MD;  Location: [facility];  Service: Orthopedics;  Laterality: Right;    Social History   Tobacco Use   Smoking status: Never   Smokeless tobacco: Never  Vaping Use   Vaping Use: Never used  Substance Use Topics   Alcohol use: Yes    Comment: social   Drug use: No    Family History  Problem Relation Age of Onset   Cancer Mother        lung   Cancer Father        lung; + tobacco   Heart disease Maternal Grandfather        heart attack   Heart disease Paternal Grandfather        heart attack   Diabetes Sister    Hypertension Sister    Hyperlipidemia Sister    Breast cancer Neg Hx     Allergies  Allergen Reactions   Penicillins Swelling and Rash    Facial swelling    Medication list has been reviewed and updated.  Current Outpatient Medications on File Prior to Visit  Medication Sig Dispense Refill   alendronate (FOSAMAX) 70 MG tablet Take 1 tablet (70 mg total) by mouth every 7 (seven) days. Take  with a full glass of water on an empty stomach. 12 tablet 3   amitriptyline (ELAVIL) 25 MG tablet TAKE ONE TABLET BY MOUTH AT BEDTIME 90 tablet 1   carvedilol (COREG) 3.125 MG tablet Take 1 tablet (3.125 mg total) by mouth 2 (two) times daily with a meal. (Patient taking differently: Take 3.125 mg by mouth at bedtime.) 180 tablet 0   lisinopril (ZESTRIL) 40 MG tablet Take 1 tablet (40 mg total) by mouth  daily. 90 tablet 3   metFORMIN (GLUCOPHAGE) 500 MG tablet TAKE ONE TABLET BY MOUTH DAILY. MAY START WITH 1/2 TAB AND INCREASE AS TOLERATED 90 tablet 1   rivaroxaban (XARELTO) 20 MG TABS tablet Take 1 tablet (20 mg total) by mouth daily with supper. 90 tablet 1   rosuvastatin (CRESTOR) 10 MG tablet Take 1 tablet (10 mg total) by mouth daily. 90 tablet 1   sertraline (ZOLOFT) 100 MG tablet TAKE 2 & 1/2 TABLETS DAILY. 225 tablet 3   traZODone (DESYREL) 50 MG tablet Take 1-2 tablets (50-100 mg total) by mouth at bedtime. 180 tablet 1   No current facility-administered medications on file prior to visit.    Review of Systems:  As per HPI- otherwise negative.   Physical Examination: Vitals:   01/07/22 1527  BP: 128/70  Pulse: 68  Resp: 18  Temp: 97.7 F (36.5 C)  SpO2: 97%   Vitals:   01/07/22 1527  Weight: 158 lb 9.6 oz (71.9 kg)  Height: '5\' 5"'$  (1.651 m)   Body mass index is 26.39 kg/m. Ideal Body Weight: Weight in (lb) to have BMI = 25: 149.9  GEN: no acute distress.  Minimal overweight, looks generally well HEENT: Atraumatic, Normocephalic.  Ears and Nose: No external deformity. CV: RRR, No M/G/R. No JVD. No thrill. No extra heart sounds. PULM: CTA B, no wheezes, crackles, rhonchi. No retractions. No resp. distress. No accessory muscle use.   ABD: S, NT, ND, +BS. No rebound. No HSM.  Mild LLQ tenderness  EXTR: No c/c/e PSYCH: Normally interactive. Conversant.    Assessment and Plan: Subacute cough - Plan: DG Chest 2 View  Diarrhea of presumed infectious origin - Plan:  CBC, Comprehensive metabolic panel, azithromycin (ZITHROMAX) 500 MG tablet  Patient seen today for follow-up.  She had COVID-19 about 2.5 weeks ago, seem to recover.  However last night severe diarrhea returned, and she also started coughing more about 2 days ago Plan for chest x-ray, lab evaluation as above.  Provided 3 days of azithromycin 500 mg for likely infectious diarrhea Encouraged hydration, low residue bland diet.  I advised her to go to the ER if she is feeling weak or like she is getting severely dehydrated.  She will keep [place] closely apprised of her progress  Signed Lamar Blinks, MD Received her chest film as below, message to patient  DG Chest 2 View  Result Date: 01/07/2022 CLINICAL DATA:  Recent covid 19 x 3 weeks patient complains of persistent cough. EXAM: CHEST - 2 VIEW COMPARISON:  10/13/2020 FINDINGS: Lungs are clear. Heart size and mediastinal contours are within normal limits. Calcified subcentimeter para-aortic mediastinal lymph nodes as before suggesting old granulomatous disease. Aortic Atherosclerosis (ICD10-170.0). No effusion. Anterior vertebral endplate spurring at multiple levels in the lower thoracic and upper lumbar spine. IMPRESSION: No acute cardiopulmonary disease. Electronically Signed   By: Lucrezia Europe M.D.   On: 01/07/2022 16:28

## 2022-01-07 ENCOUNTER — Ambulatory Visit (HOSPITAL_BASED_OUTPATIENT_CLINIC_OR_DEPARTMENT_OTHER)
Admission: RE | Admit: 2022-01-07 | Discharge: 2022-01-07 | Disposition: A | Discharge status: Home / Self Care | Payer: Medicare Other | Source: Ambulatory Visit | Attending: Family Medicine | Admitting: Family Medicine

## 2022-01-07 ENCOUNTER — Encounter: Payer: Self-pay | Admitting: Family Medicine

## 2022-01-07 ENCOUNTER — Ambulatory Visit (INDEPENDENT_AMBULATORY_CARE_PROVIDER_SITE_OTHER): Payer: Medicare Other | Admitting: Family Medicine

## 2022-01-07 VITALS — BP 128/70 | HR 68 | Temp 97.7°F | Resp 18 | Ht 65.0 in | Wt 158.6 lb

## 2022-01-07 DIAGNOSIS — I6312 Cerebral infarction due to embolism of basilar artery: Secondary | ICD-10-CM | POA: Diagnosis not present

## 2022-01-07 DIAGNOSIS — R197 Diarrhea, unspecified: Secondary | ICD-10-CM | POA: Diagnosis not present

## 2022-01-07 DIAGNOSIS — R052 Subacute cough: Secondary | ICD-10-CM | POA: Insufficient documentation

## 2022-01-07 DIAGNOSIS — R059 Cough, unspecified: Secondary | ICD-10-CM | POA: Diagnosis not present

## 2022-01-07 MED ORDER — AZITHROMYCIN 500 MG PO TABS: 500.0000 mg | ORAL_TABLET | Freq: Every day | ORAL | 0 refills | Status: DC

## 2022-01-07 NOTE — Patient Instructions (Signed)
I am sorry you are not feeing well!   Labs, x-ray I sent in an rx for azithromycin 500 mg daily for 3 days for your diarrhea If you like reasonable to wait a day or so and see if the diarrhea resolves first  Please seek care if you are getting worse!   Fluids, bland diet recommended

## 2022-01-08 ENCOUNTER — Encounter: Payer: Self-pay | Admitting: Family Medicine

## 2022-01-08 LAB — COMPREHENSIVE METABOLIC PANEL
ALT: 13 U/L (ref 0–35)
AST: 16 U/L (ref 0–37)
Albumin: 4.1 g/dL (ref 3.5–5.2)
Alkaline Phosphatase: 66 U/L (ref 39–117)
BUN: 17 mg/dL (ref 6–23)
CO2: 27 mEq/L (ref 19–32)
Calcium: 9.7 mg/dL (ref 8.4–10.5)
Chloride: 107 mEq/L (ref 96–112)
Creatinine, Ser: 0.97 mg/dL (ref 0.40–1.20)
GFR: 57.71 mL/min — ABNORMAL LOW (ref >60.00)
Glucose, Bld: 107 mg/dL — ABNORMAL HIGH (ref 70–99)
Potassium: 4.5 mEq/L (ref 3.5–5.1)
Sodium: 142 mEq/L (ref 135–145)
Total Bilirubin: 0.6 mg/dL (ref 0.2–1.2)
Total Protein: 6.4 g/dL (ref 6.0–8.3)

## 2022-01-08 LAB — CBC
HCT: 39.4 % (ref 36.0–46.0)
Hemoglobin: 12.7 g/dL (ref 12.0–15.0)
MCHC: 32.3 g/dL (ref 30.0–36.0)
MCV: 80.4 fl (ref 78.0–100.0)
Platelets: 182 10*3/uL (ref 150.0–400.0)
RBC: 4.91 Mil/uL (ref 3.87–5.11)
RDW: 14.1 % (ref 11.5–15.5)
WBC: 7.2 10*3/uL (ref 4.0–10.5)

## 2022-01-28 ENCOUNTER — Ambulatory Visit
Admission: RE | Admit: 2022-01-28 | Discharge: 2022-01-28 | Disposition: A | Discharge status: Home / Self Care | Payer: Medicare Other | Source: Ambulatory Visit | Attending: Family Medicine | Admitting: Family Medicine

## 2022-01-28 DIAGNOSIS — K862 Cyst of pancreas: Secondary | ICD-10-CM | POA: Diagnosis not present

## 2022-01-28 DIAGNOSIS — R1900 Intra-abdominal and pelvic swelling, mass and lump, unspecified site: Secondary | ICD-10-CM

## 2022-01-28 MED ORDER — GADOPICLENOL 0.5 MMOL/ML IV SOLN
7.0000 mL | Freq: Once | INTRAVENOUS | Status: AC | PRN
  Administered 2022-01-28: 7 mL via INTRAVENOUS

## 2022-01-29 ENCOUNTER — Other Ambulatory Visit: Payer: Self-pay | Admitting: Family Medicine

## 2022-01-29 ENCOUNTER — Encounter: Payer: Self-pay | Admitting: Family Medicine

## 2022-01-29 DIAGNOSIS — Q453 Other congenital malformations of pancreas and pancreatic duct: Secondary | ICD-10-CM

## 2022-02-01 ENCOUNTER — Other Ambulatory Visit: Payer: Self-pay | Admitting: Family Medicine

## 2022-02-01 DIAGNOSIS — I10 Essential (primary) hypertension: Secondary | ICD-10-CM

## 2022-02-02 ENCOUNTER — Other Ambulatory Visit: Payer: Self-pay | Admitting: Family Medicine

## 2022-02-02 DIAGNOSIS — Z1231 Encounter for screening mammogram for malignant neoplasm of breast: Secondary | ICD-10-CM

## 2022-02-08 ENCOUNTER — Inpatient Hospital Stay: Payer: Medicare Other

## 2022-02-08 ENCOUNTER — Ambulatory Visit: Payer: Medicare Other | Admitting: Hematology & Oncology

## 2022-02-10 DIAGNOSIS — D490 Neoplasm of unspecified behavior of digestive system: Secondary | ICD-10-CM | POA: Insufficient documentation

## 2022-02-16 ENCOUNTER — Inpatient Hospital Stay: Payer: Medicare Other

## 2022-02-16 ENCOUNTER — Inpatient Hospital Stay: Payer: Medicare Other | Admitting: Hematology & Oncology

## 2022-02-17 ENCOUNTER — Other Ambulatory Visit: Payer: Self-pay

## 2022-02-17 NOTE — Progress Notes (Signed)
The proposed treatment discussed in conference is for discussion purpose only and is not a binding recommendation.  The patients have not been physically examined, or presented with their treatment options.  Therefore, final treatment plans cannot be decided.  

## 2022-02-22 DIAGNOSIS — Z23 Encounter for immunization: Secondary | ICD-10-CM | POA: Diagnosis not present

## 2022-02-23 ENCOUNTER — Telehealth: Payer: Self-pay

## 2022-02-23 NOTE — Telephone Encounter (Signed)
12/12 at 930 am is the very first available appt with you.  You are double and tripled booked until that appt.  Is that ok?       Mansouraty, Telford Nab., MD  Dwan Bolt, MD; Timothy Lasso, RN Ramaj Frangos, Please schedule this patient with me and okay to use an overbook slot or 3:50 PM slot or held slot in the next few weeks with me. Please let Dr. Zenia Resides know when she is scheduled. Thanks. GM       Previous Messages    ----- Message ----- From: Dwan Bolt, MD Sent: 02/23/2022  12:43 PM EST To: Timothy Lasso, RN; Irving Copas., MD Subject: RE: EUS                                        I ordered a referral, it should hopefully be coming through in the next 1-2 days. Thanks, SA

## 2022-02-23 NOTE — Telephone Encounter (Signed)
-----   Message from Irving Copas., MD sent at 02/23/2022  4:55 AM EST ----- Regarding: RE: EUS SA, No worries.  We will get her into clinic to discuss.  Reply back to me, when you have sent in the referral and then I will forward this to [facility] and she can work on scheduling as an overbook for me. Thanks. GM  Neyra Pettie, We will wait to hear back from Dr. Zenia Resides and then work on scheduling her for a clinic.  Thanks. GM ----- Message ----- From: Dwan Bolt, MD Sent: 02/22/2022   6:00 PM EST To: Irving Copas., MD Subject: EUS                                            GM, We discussed this patient at conference last week, she has several small pancreatic cysts and very mildly elevated CA19-9. No suspicious features on imaging. I discussed with her and initially we had just planned for surveillance in 3 months, but she changed mind and is leaning towards wanting EUS. Would you be willing to at least meet with her and discuss this? I don't think it's absolutely necessary for her to have an EUS at this point, but with her family history and mildly elevated CA19-9 I don't think it's unreasonable. I told her it would likely take several months to get the EUS done but I will still plan to have her follow up with me in 3 months. I can have our office fax a referral if you are ok with seeing her. Thanks, [place]

## 2022-02-24 NOTE — Telephone Encounter (Signed)
Inbound call from patient returning call. Advised her of her upcoming apt. Please give a call back to further advise.  Thank you

## 2022-02-24 NOTE — Telephone Encounter (Signed)
Thanks works Event organiser. Thanks.

## 2022-02-24 NOTE — Telephone Encounter (Signed)
The pt has been advised of the appt date, time and address.  She will call back with any concerns.

## 2022-02-24 NOTE — Telephone Encounter (Signed)
Left message on machine to call back  

## 2022-03-08 ENCOUNTER — Encounter: Payer: Self-pay | Admitting: Hematology & Oncology

## 2022-03-08 ENCOUNTER — Inpatient Hospital Stay: Payer: Medicare Other | Attending: Hematology & Oncology

## 2022-03-08 ENCOUNTER — Inpatient Hospital Stay (HOSPITAL_BASED_OUTPATIENT_CLINIC_OR_DEPARTMENT_OTHER): Payer: Medicare Other | Admitting: Hematology & Oncology

## 2022-03-08 ENCOUNTER — Other Ambulatory Visit: Payer: Self-pay

## 2022-03-08 VITALS — BP 162/51 | HR 59 | Temp 97.9°F | Resp 18 | Ht 65.75 in | Wt 162.0 lb

## 2022-03-08 DIAGNOSIS — I2694 Multiple subsegmental pulmonary emboli without acute cor pulmonale: Secondary | ICD-10-CM

## 2022-03-08 DIAGNOSIS — I2601 Septic pulmonary embolism with acute cor pulmonale: Secondary | ICD-10-CM

## 2022-03-08 DIAGNOSIS — Z7901 Long term (current) use of anticoagulants: Secondary | ICD-10-CM | POA: Insufficient documentation

## 2022-03-08 DIAGNOSIS — I2699 Other pulmonary embolism without acute cor pulmonale: Secondary | ICD-10-CM | POA: Insufficient documentation

## 2022-03-08 LAB — CBC WITH DIFFERENTIAL (CANCER CENTER ONLY)
Abs Immature Granulocytes: 0.01 10*3/uL (ref 0.00–0.07)
Basophils Absolute: 0.1 10*3/uL (ref 0.0–0.1)
Basophils Relative: 1 %
Eosinophils Absolute: 0.1 10*3/uL (ref 0.0–0.5)
Eosinophils Relative: 2 %
HCT: 42.1 % (ref 36.0–46.0)
Hemoglobin: 13.3 g/dL (ref 12.0–15.0)
Immature Granulocytes: 0 %
Lymphocytes Relative: 28 %
Lymphs Abs: 1.6 10*3/uL (ref 0.7–4.0)
MCH: 26.1 pg (ref 26.0–34.0)
MCHC: 31.6 g/dL (ref 30.0–36.0)
MCV: 82.5 fL (ref 80.0–100.0)
Monocytes Absolute: 0.3 10*3/uL (ref 0.1–1.0)
Monocytes Relative: 6 %
Neutro Abs: 3.8 10*3/uL (ref 1.7–7.7)
Neutrophils Relative %: 63 %
Platelet Count: 176 10*3/uL (ref 150–400)
RBC: 5.1 MIL/uL (ref 3.87–5.11)
RDW: 13.3 % (ref 11.5–15.5)
WBC Count: 5.9 10*3/uL (ref 4.0–10.5)
nRBC: 0 % (ref 0.0–0.2)

## 2022-03-08 LAB — CMP (CANCER CENTER ONLY)
ALT: 12 U/L (ref 0–44)
AST: 15 U/L (ref 15–41)
Albumin: 4.4 g/dL (ref 3.5–5.0)
Alkaline Phosphatase: 60 U/L (ref 38–126)
Anion gap: 8 (ref 5–15)
BUN: 14 mg/dL (ref 8–23)
CO2: 27 mmol/L (ref 22–32)
Calcium: 9.6 mg/dL (ref 8.9–10.3)
Chloride: 108 mmol/L (ref 98–111)
Creatinine: 1.05 mg/dL — ABNORMAL HIGH (ref 0.44–1.00)
GFR, Estimated: 56 mL/min — ABNORMAL LOW (ref >60)
Glucose, Bld: 144 mg/dL — ABNORMAL HIGH (ref 70–99)
Potassium: 4.3 mmol/L (ref 3.5–5.1)
Sodium: 143 mmol/L (ref 135–145)
Total Bilirubin: 0.5 mg/dL (ref 0.3–1.2)
Total Protein: 6.6 g/dL (ref 6.5–8.1)

## 2022-03-08 LAB — D-DIMER, QUANTITATIVE: D-Dimer, Quant: 0.29 ug/mL-FEU (ref 0.00–0.50)

## 2022-03-08 MED ORDER — PANCRELIPASE (LIP-PROT-AMYL) 36000-114000 UNITS PO CPEP: ORAL_CAPSULE | ORAL | 11 refills | Status: DC

## 2022-03-08 NOTE — Progress Notes (Signed)
Hematology and Oncology Follow Up Visit  Monica Kane [id number] 1946/02/25 76 y.o. 03/08/2022   Principle Diagnosis:  Pulmonary embolism-bilateral-idiopathic  Current Therapy:   Xarelto 20 mg p.o. daily-started on 05/24/2021     Interim History:  Monica Kane is back for follow-up.  She was in [place] for a dance competition.  Unfortunate, she fell before the competition.  She cannot compete.  I hate this for her.  Thankfully, her husband was able to compete and he did well.  Her next trip is over to [place] after Thanksgiving.  She has a niece over there that she will see.  Otherwise, she is doing okay.  She has had no problems on the flights going over and back from [place].  She is on Xarelto.  She is doing well on the Xarelto.  She is having problems with diarrhea.  She has a pancreatic cyst.  She says that her last CA 19-9 was little bit elevated.  A surgeon went ahead and referred her to Gastroenterology for an endoscopy and likely endoscopic evaluation and ultrasound.  I will send in [place] for her.  Sound like she may have little bit of pancreatic insufficiency.  I also told her to take some over-the-counter probiotic.  She has had no fever.  She has had no bleeding.  There has been no urinary difficulties.  She has had no chest pain.  There has been no cough or shortness of breath.  Overall, I would say performance status is probably ECOG 1.     Medications:  Current Outpatient Medications:    alendronate (FOSAMAX) 70 MG tablet, Take 1 tablet (70 mg total) by mouth every 7 (seven) days. Take with a full glass of water on an empty stomach., Disp: 12 tablet, Rfl: 3   amitriptyline (ELAVIL) 25 MG tablet, TAKE ONE TABLET BY MOUTH AT BEDTIME, Disp: 90 tablet, Rfl: 1   azithromycin (ZITHROMAX) 500 MG tablet, Take 1 tablet (500 mg total) by mouth daily. Take for 3 days for diarrhea, Disp: 3 tablet, Rfl: 0   carvedilol (COREG) 3.125 MG tablet, Take 1 tablet (3.125 mg total) by mouth 2  (two) times daily with a meal. (Patient taking differently: Take 3.125 mg by mouth at bedtime.), Disp: 180 tablet, Rfl: 0   lisinopril (ZESTRIL) 40 MG tablet, TAKE ONE TABLET BY MOUTH DAILY, Disp: 90 tablet, Rfl: 3   metFORMIN (GLUCOPHAGE) 500 MG tablet, TAKE ONE TABLET BY MOUTH DAILY. MAY START WITH 1/2 TAB AND INCREASE AS TOLERATED, Disp: 90 tablet, Rfl: 1   rivaroxaban (XARELTO) 20 MG TABS tablet, Take 1 tablet (20 mg total) by mouth daily with supper., Disp: 90 tablet, Rfl: 1   rosuvastatin (CRESTOR) 10 MG tablet, Take 1 tablet (10 mg total) by mouth daily., Disp: 90 tablet, Rfl: 1   sertraline (ZOLOFT) 100 MG tablet, TAKE 2 & 1/2 TABLETS DAILY., Disp: 225 tablet, Rfl: 3   traZODone (DESYREL) 50 MG tablet, Take 1-2 tablets (50-100 mg total) by mouth at bedtime., Disp: 180 tablet, Rfl: 1  Allergies:  Allergies  Allergen Reactions   Penicillins Swelling and Rash    Facial swelling    Past Medical History, Surgical history, Social history, and Family History were reviewed and updated.  Review of Systems: Review of Systems  Constitutional: Negative.   HENT:  Negative.    Eyes: Negative.   Respiratory: Negative.    Cardiovascular: Negative.   Gastrointestinal: Negative.   Endocrine: Negative.   Genitourinary: Negative.    Musculoskeletal: Negative.  Skin: Negative.   Neurological: Negative.   Hematological: Negative.   Psychiatric/Behavioral: Negative.      Physical Exam:  vitals were not taken for this visit.   Wt Readings from Last 3 Encounters:  01/07/22 158 lb 9.6 oz (71.9 kg)  10/27/21 158 lb (71.7 kg)  09/28/21 160 lb (72.6 kg)    Physical Exam Vitals reviewed.  HENT:     Head: Normocephalic and atraumatic.  Eyes:     Pupils: Pupils are equal, round, and reactive to light.  Cardiovascular:     Rate and Rhythm: Normal rate and regular rhythm.     Heart sounds: Normal heart sounds.  Pulmonary:     Effort: Pulmonary effort is normal.     Breath sounds: Normal  breath sounds.  Abdominal:     General: Bowel sounds are normal.     Palpations: Abdomen is soft.  Musculoskeletal:        General: No tenderness or deformity. Normal range of motion.     Cervical back: Normal range of motion.  Lymphadenopathy:     Cervical: No cervical adenopathy.  Skin:    General: Skin is warm and dry.     Findings: No erythema or rash.  Neurological:     Mental Status: She is alert and oriented to person, place, and time.  Psychiatric:        Behavior: Behavior normal.        Thought Content: Thought content normal.        Judgment: Judgment normal.      Lab Results  Component Value Date   WBC 5.9 03/08/2022   HGB 13.3 03/08/2022   HCT 42.1 03/08/2022   MCV 82.5 03/08/2022   PLT 176 03/08/2022     Chemistry      Component Value Date/Time   NA 142 01/07/2022 1558   K 4.5 01/07/2022 1558   CL 107 01/07/2022 1558   CO2 27 01/07/2022 1558   BUN 17 01/07/2022 1558   CREATININE 0.97 01/07/2022 1558   CREATININE 1.13 (H) 10/27/2021 1015   CREATININE 0.81 08/12/2016 1348      Component Value Date/Time   CALCIUM 9.7 01/07/2022 1558   ALKPHOS 66 01/07/2022 1558   AST 16 01/07/2022 1558   AST 16 10/27/2021 1015   ALT 13 01/07/2022 1558   ALT 13 10/27/2021 1015   BILITOT 0.6 01/07/2022 1558   BILITOT 0.6 10/27/2021 1015      Impression and Plan: Monica Kane is a very nice 76 year old white female.  She had bilateral pulmonary emboli.  This is idiopathic from all of her studies to date.  Thankfully, she did not have a high clot burden.  She still will need full anticoagulation for 2 years.  After that, I would then put her on maintenance anticoagulation.  Again, I really hate the fact that she cannot compete in the dance competition.  I am sure that she would have done very well.  We will now plan to see her back in 6 months.  I think that this is a reasonable time of follow-up.  She knows to stay well-hydrated while she travels back and forth from  [place].  Hopefully, there will be nothing found when she has the endoscopic ultrasound for her pancreas.  Volanda Napoleon, MD 11/20/20238:12 AM

## 2022-03-30 ENCOUNTER — Telehealth: Payer: Self-pay

## 2022-03-30 ENCOUNTER — Encounter: Payer: Self-pay | Admitting: Gastroenterology

## 2022-03-30 ENCOUNTER — Ambulatory Visit (INDEPENDENT_AMBULATORY_CARE_PROVIDER_SITE_OTHER): Payer: Medicare Other | Admitting: Gastroenterology

## 2022-03-30 VITALS — BP 130/80 | HR 64 | Ht 65.75 in | Wt 166.0 lb

## 2022-03-30 DIAGNOSIS — R978 Other abnormal tumor markers: Secondary | ICD-10-CM

## 2022-03-30 DIAGNOSIS — Z86010 Personal history of colonic polyps: Secondary | ICD-10-CM | POA: Diagnosis not present

## 2022-03-30 DIAGNOSIS — K862 Cyst of pancreas: Secondary | ICD-10-CM

## 2022-03-30 DIAGNOSIS — Z860101 Personal history of adenomatous and serrated colon polyps: Secondary | ICD-10-CM

## 2022-03-30 DIAGNOSIS — Z7901 Long term (current) use of anticoagulants: Secondary | ICD-10-CM

## 2022-03-30 DIAGNOSIS — Z800 Family history of malignant neoplasm of digestive organs: Secondary | ICD-10-CM | POA: Diagnosis not present

## 2022-03-30 NOTE — Patient Instructions (Signed)
You have been scheduled for an endoscopy. Please follow written instructions given to you at your visit today. If you use inhalers (even only as needed), please bring them with you on the day of your procedure.  Due to recent changes in healthcare laws, you may see the results of your imaging and laboratory studies on MyChart before your provider has had a chance to review them.  We understand that in some cases there may be results that are confusing or concerning to you. Not all laboratory results come back in the same time frame and the provider may be waiting for multiple results in order to interpret others.  Please give [place] 48 hours in order for your provider to thoroughly review all the results before contacting the office for clarification of your results.   You will be contaced by our office prior to your procedure for directions on holding your Xarelto.  If you do not hear from our office 1 week prior to your scheduled procedure, please call [phone] to discuss.  Thank you for choosing me and [facility].  Dr. Rush Landmark

## 2022-03-30 NOTE — Telephone Encounter (Signed)
[facility] Pre-operative Risk Assessment     Request for surgical clearance:     Endoscopy Procedure  What type of surgery is being performed?     Colonoscopy   When is this surgery scheduled?     05/13/22  What type of clearance is required ?   Pharmacy-Medication   Are there any medications that need to be held prior to surgery and how long? Xarelto x2 days prior to procedure.   Practice name and name of physician performing surgery?      [facility]  What is your office phone and fax number?      Phone- [phone]  [phone]  Anesthesia type (None, local, MAC, general) ?       MAC

## 2022-03-30 NOTE — Progress Notes (Unsigned)
[place] VISIT   Primary Care Provider Copland, Gay Filler, MD [address] [place] [address] [phone]  Referring Provider Dr. Zenia Resides  Patient Profile: Monica Kane is a 76 y.o. female with a pmh significant for hypertension, hyperlipidemia, history of PE (on anticoagulation) anxiety, MDD, prior kidney stones, prediabetes, family history pancreas cancer (aunt), colon polyps (TA's).  The patient presents to the [facility] for an evaluation and management of problem(s) noted below:  Problem List 1. Pancreatic cyst   2. Elevated CA 19-9 level   3. Family history of malignant neoplasm of pancreas   4. Hx of adenomatous colonic polyps   5. Chronic anticoagulation     History of Present Illness This is the patient's first visit to the outpatient [place] clinic.  The patient is referred for consideration of next steps in evaluation of a pancreatic cyst as well as elevated CA 19-9 level.  Earlier this year she was experiencing issues of shortness of breath and a CT scan of the chest showed evidence of a PE as well as a slightly abnormal appearance of the pancreas.  This led to an MRI/MRCP in April showed a cystic area in the head and body and tail regions.  110-monthfollow-up MRI/MRCP was performed and subsequently the patient was evaluated by hepatobiliary surgery, Dr. AZenia Resides  Her case was discussed at a multidisciplinary conference to consider role of active surveillance with imaging versus potential needs of EUS.  [facility] for [facility] pending laboratory evaluation.  Her CA 19-9 returned slightly elevated and for this particular reason an earlier interval imaging study was recommended at a 385-monthnterval.  The patient states that she has never been diagnosed with any pancreas problems before the imaging findings.  There is a family history of pancreas  cancer in an aunt but no other history of pancreas problems.  The patient denies any significant weight loss unintentionally.  No issues of jaundice have ever developed or darkened urine have been noted.  [facility] [place]  She remains on anticoagulation with plans of follow-up in February with Dr. EnMarin Olp GI Review of Systems Positive as above Negative for dysphagia, odynophagia, nausea, vomiting, bloating, change in appetite, change in bowel habits, melena, hematochezia  Review of Systems General: Denies fevers/chills/weight loss unintentionally Cardiovascular: Denies chest pain Pulmonary: Denies shortness of breath Gastroenterological: See HPI Genitourinary: Denies darkened urine or hematuria Hematological: Positive for history of easy bruising/bleeding to anticoagulation Dermatological: Denies jaundice Psychological: Mood is stable   Medications Current Outpatient Medications  Medication Sig Dispense Refill   alendronate (FOSAMAX) 70 MG tablet Take 1 tablet (70 mg total) by mouth every 7 (seven) days. Take with a full glass of water on an empty stomach. 12 tablet 3   carvedilol (COREG) 3.125 MG tablet Take 1 tablet (3.125 mg total) by mouth 2 (two) times daily with a meal. (Patient taking differently: Take 3.125 mg by mouth at bedtime.) 180 tablet 0   lisinopril (ZESTRIL) 40 MG tablet TAKE ONE TABLET BY MOUTH DAILY 90 tablet 3   metFORMIN (GLUCOPHAGE) 500 MG tablet TAKE ONE TABLET BY MOUTH DAILY. MAY START WITH 1/2 TAB AND INCREASE AS TOLERATED 90 tablet 1   rosuvastatin (CRESTOR) 10 MG tablet Take 1 tablet (10 mg total) by mouth daily. 90[place]agan  tablet 1   sertraline (ZOLOFT) 100 MG tablet TAKE 2 & 1/2 TABLETS DAILY. 225 tablet 3   traZODone (DESYREL) 50 MG tablet Take 1-2 tablets (50-100 mg total) by mouth at bedtime. 180 tablet 1   amitriptyline (ELAVIL) 25 MG tablet TAKE  ONE TABLET BY MOUTH AT BEDTIME 90 tablet 1   lipase/protease/amylase (CREON) 36000 UNITS CPEP capsule Take 2 capsules (72,000 Units total) by mouth 3 (three) times daily with meals. May also take 1 capsule (36,000 Units total) as needed (with snacks). (Patient not taking: Reported on 03/30/2022) 240 capsule 11   XARELTO 20 MG TABS tablet TAKE ONE TABLET BY MOUTH DAILY with SUPPER 90 tablet 1   No current facility-administered medications for this visit.    Allergies Allergies  Allergen Reactions   Penicillins Swelling and Rash    Facial swelling    Histories Past Medical History:  Diagnosis Date   Anxiety    takes Ativan nightly   Depression    takes Zoloft daily   History of colon polyps    History of kidney stones    History of staph infection 11/2013   Hyperlipidemia    takes Crestor nightly    Hypertension    takes Lisinopril and Coreg daily   Joint pain    Joint swelling    Neuropathy    takes Elavil nightly   Pneumonia    Pre-diabetes    Primary localized osteoarthritis of right knee    Pulmonary embolism ([place]) 07/08/2021   Sleep apnea    uses oral appliance   Past Surgical History:  Procedure Laterality Date   ABDOMINAL HYSTERECTOMY  04/20/1987   APPENDECTOMY     CARDIAC CATHETERIZATION  04/19/2013   CERVICAL SPINE SURGERY  04/19/2002   C 5-6   COLONOSCOPY     colonscopy     DIAGNOSTIC LAPAROSCOPY  04/19/1974   fibroid removed   ELBOW ARTHROSCOPY  2010, 2012   EYE SURGERY Bilateral    cataract   FOOT SURGERY Left 04/20/2011   GASTROCNEMIUS RECESSION Right 10/09/2019   Procedure: RIGHT GASTROCNEMIUS RECESSION;  Surgeon: Newt Minion, MD;  Location: [facility];  Service: Orthopedics;  Laterality: Right;   HYSTEROTOMY  1989   KNEE ARTHROSCOPY  01/17/2013   KNEE ARTHROSCOPY W/ MENISCECTOMY Right 01/31/2013   [place]   LATERAL EPICONDYLE RELEASE  03/01/2011   Procedure: TENNIS ELBOW RELEASE;  Surgeon: Lorn Junes, MD;  Location: [facility];  Service: Orthopedics;  Laterality: Left;  TENOTOMY ELBOW LATERAL EPICONDYLITIS TENNIS ELBOW   lateral release right elbow  04/19/2008   left foot surgery  04/20/2011   mortons neuroma   LEFT HEART CATHETERIZATION WITH CORONARY ANGIOGRAM N/A 07/23/2013   Procedure: LEFT HEART CATHETERIZATION WITH CORONARY ANGIOGRAM;  Surgeon: Sinclair Grooms, MD;  Location: [facility] CATH LAB;  Service: Cardiovascular;  Laterality: N/A;   PAROTID GLAND TUMOR EXCISION  04/19/1974   benign   PAROTID GLAND TUMOR EXCISION  04/19/1974   rt   SHOULDER ARTHROSCOPY DISTAL CLAVICLE EXCISION AND OPEN ROTATOR CUFF REPAIR  04/19/2004   SHOULDER ARTHROSCOPY W/ ROTATOR CUFF REPAIR  04/19/2004   TOTAL KNEE ARTHROPLASTY Right 02/18/2014   Procedure: TOTAL KNEE ARTHROPLASTY;  Surgeon: Lorn Junes, MD;  Location: [facility];  Service: Orthopedics;  Laterality: Right;   WEIL OSTEOTOMY Right 10/09/2019   Procedure: WEIL OSTEOTOMY RIGHT 2ND METATARSAL;  Surgeon: Newt Minion, MD;  Location: [facility];  Service: Orthopedics;  Laterality: Right;  Social History   Socioeconomic History   Marital status: Married    Spouse name: Danaiya Steadman   Number of children: Not on file   Years of education: Not on file   Highest education level: Not on file  Occupational History   Not on file  Tobacco Use   Smoking status: Never   Smokeless tobacco: Never  Vaping Use   Vaping Use: Never used  Substance and Sexual Activity   Alcohol use: Yes    Comment: social   Drug use: No   Sexual activity: Yes    Birth control/protection: Surgical  Other Topics Concern   Not on file  Social History Narrative   Lives with her husband.  He has 2 adult children. Patient has a Financial risk analyst, and she does exercise.   Social Determinants of Health   Financial Resource Strain: Low Risk  (09/28/2021)   Overall Financial Resource Strain (CARDIA)    Difficulty of Paying Living Expenses: Not hard at all  Food  Insecurity: No Food Insecurity (09/28/2021)   Hunger Vital Sign    Worried About Running Out of Food in the Last Year: Never true    Ran Out of Food in the Last Year: Never true  Transportation Needs: No Transportation Needs (09/28/2021)   PRAPARE - Hydrologist (Medical): No    Lack of Transportation (Non-Medical): No  Physical Activity: Sufficiently Active (09/28/2021)   Exercise Vital Sign    Days of Exercise per Week: 3 days    Minutes of Exercise per Session: 60 min  Stress: No Stress Concern Present (09/28/2021)   [place]    Feeling of Stress : Not at all  Social Connections: Moderately Isolated (09/28/2021)   Social Connection and Isolation Panel [NHANES]    Frequency of Communication with Friends and Family: Twice a week    Frequency of Social Gatherings with Friends and Family: Once a week    Attends Religious Services: Never    Marine scientist or Organizations: No    Attends Archivist Meetings: Never    Marital Status: Married  Human resources officer Violence: Not At Risk (09/28/2021)   Humiliation, Afraid, Rape, and Kick questionnaire    Fear of Current or Ex-Partner: No    Emotionally Abused: No    Physically Abused: No    Sexually Abused: No   Family History  Problem Relation Age of Onset   Cancer Mother        lung   Cancer Father        lung; + tobacco   Diabetes Sister    Hypertension Sister    Hyperlipidemia Sister    Kidney failure Sister    Pancreatic cancer Maternal Aunt    Heart disease Maternal Grandfather        heart attack   Heart disease Paternal Grandfather        heart attack   Breast cancer Neg Hx    Colon cancer Neg Hx    Esophageal cancer Neg Hx    Inflammatory bowel disease Neg Hx    Liver disease Neg Hx    Rectal cancer Neg Hx    Stomach cancer Neg Hx    I have reviewed her medical, social, and family history in detail and updated  the electronic medical record as necessary.    PHYSICAL EXAMINATION  BP 130/80 (BP Location: Left Arm, Patient Position: Sitting, Cuff Size: Normal)  Pulse 64   Ht 5' 5.75" (1.67 m)   Wt 166 lb (75.3 kg)   SpO2 94%   BMI 27.00 kg/m  Wt Readings from Last 3 Encounters:  03/30/22 166 lb (75.3 kg)  03/08/22 162 lb (73.5 kg)  01/07/22 158 lb 9.6 oz (71.9 kg)  GEN: NAD, appears stated age, doesn't appear chronically ill PSYCH: Cooperative, without pressured speech EYE: Conjunctivae pink, sclerae anicteric ENT: MMM CV: Nontachycardic RESP: No audible wheezing GI: NABS, soft, NT/ND, without rebound or guarding MSK/EXT: No lower extremity edema SKIN: No jaundice NEURO:  Alert & Oriented x 3, no focal deficits   REVIEW OF DATA  I reviewed the following data at the time of this encounter:  GI Procedures and Studies  2015 Colonoscopy outside provider Examined portion of the ileum is normal. 1, 5 mm polyp in [place] Diverticulosis in [place] Tortuous colon. Nonbleeding internal hemorrhoids The examination was otherwise normal  2022 colonoscopy outside provider Impression:    - Perianal skin tags found on perianal exam.                       - One 4 mm polyp at the splenic flexure, removed with                       a cold snare. Resected and retrieved.                       - Diverticulosis in the sigmoid colon.                       - Tortuous colon.                       - Non-bleeding internal hemorrhoids.                       - The examination was otherwise normal.  Pathology Tubular adenoma   Laboratory Studies  Reviewed those in epic and care everywhere  October 2023 CA 19-9 in care everywhere 41 (upper limit of normal 35)  Imaging Studies  MRI/MRCP IMPRESSION: 1. No significant change in multiloculated, multicystic lesions of the pancreas, most conspicuously in the neck and tail of the pancreas, largest lesion in the ventral pancreatic neck measuring approximately  2.2 x 2.0 cm. Some perceptible contrast enhancement of septal components or interposed parenchyma between small cysts associated with this lesions, however there is no pancreatic ductal dilatation. These may again reflect complex IPMNs or pseudocystic change sequela of prior pancreatitis. Recommend continued follow-up at 6 months to ensure stability with a goal of establishing at least 2 years initial stability. 2. Mild hepatic steatosis.   ASSESSMENT  Monica Kane is a 76 y.o. female with a pmh significant for hypertension, hyperlipidemia, history of PE (on anticoagulation) anxiety, MDD, prior kidney stones, prediabetes, family history pancreas cancer (aunt), colon polyps (TA's).  The patient is seen today for evaluation and management of:  1. Pancreatic cyst   2. Elevated CA 19-9 level   3. Family history of malignant neoplasm of pancreas   4. Hx of adenomatous colonic polyps   5. Chronic anticoagulation    The patient is clinically and hemodynamically stable.  Her cysts look as if they are likely branch duct IPMN's.  SCAs could also have some of this type of appearance as well.  MCN seems less likely though not  impossible.  With her slightly elevated CA 19-9, it is reasonable to consider endoscopic ultrasound for this patient.  She is not having any other red flag symptoms, we will plan to do her 24-monthinterval imaging study which was recommended by hepatobiliary surgery in January with an endoscopic ultrasound.  The risks of an EUS including intestinal perforation, bleeding, infection, aspiration, and medication effects were discussed as was the possibility it may not give a definitive diagnosis if a biopsy is performed.  When a biopsy of the pancreas is done as part of the EUS, there is an additional risk of pancreatitis at the rate of about 1-2%.  It was explained that procedure related pancreatitis is typically mild, although it can be severe and even life threatening, which is why we do  not perform random pancreatic biopsies and only biopsy a lesion/area we feel is concerning enough to warrant the risk.  Pending the results at time of EUS will hopefully be able to push out her surveillance further while continuing to monitor her closely.  Her follow-up colonoscopy is due in 2027 for recommendations from her last gastroenterologist (and if she still remains healthy enough for colon polyp surveillance we will consider it at that time).  All patient questions were answered to the best of my ability, and the patient agrees to the aforementioned plan of action with follow-up as indicated.   PLAN  Proceed with scheduling EUS Will get hematology approval for hold of anticoagulation Surveillance imaging of the pancreas will be determined based on EUS findings Colon polyp surveillance due in 2027 (recall placed)   Orders Placed This Encounter  Procedures   Procedural/ Surgical Case Request: UPPER ESOPHAGEAL ENDOSCOPIC ULTRASOUND (EUS)   Ambulatory referral to Gastroenterology    New Prescriptions   No medications on file   Modified Medications   Modified Medication Previous Medication   AMITRIPTYLINE (ELAVIL) 25 MG TABLET amitriptyline (ELAVIL) 25 MG tablet      TAKE ONE TABLET BY MOUTH AT BEDTIME    TAKE ONE TABLET BY MOUTH AT BEDTIME   XARELTO 20 MG TABS TABLET rivaroxaban (XARELTO) 20 MG TABS tablet      TAKE ONE TABLET BY MOUTH DAILY with SUPPER    Take 1 tablet (20 mg total) by mouth daily with supper.    Planned Follow Up No follow-ups on file.   Total Time in Face-to-Face and in Coordination of Care for patient including independent/personal interpretation/review of prior testing, medical history, examination, medication adjustment, communicating results with the patient directly, and documentation within the EHR is 45 minutes.   GJustice Britain MD [facility] Office # [phone]

## 2022-03-31 ENCOUNTER — Other Ambulatory Visit: Payer: Self-pay | Admitting: Family Medicine

## 2022-03-31 ENCOUNTER — Ambulatory Visit
Admission: RE | Admit: 2022-03-31 | Discharge: 2022-03-31 | Disposition: A | Discharge status: Home / Self Care | Payer: Medicare Other | Source: Ambulatory Visit | Attending: Family Medicine | Admitting: Family Medicine

## 2022-03-31 ENCOUNTER — Encounter: Payer: Self-pay | Admitting: Gastroenterology

## 2022-03-31 DIAGNOSIS — G609 Hereditary and idiopathic neuropathy, unspecified: Secondary | ICD-10-CM

## 2022-03-31 DIAGNOSIS — N6310 Unspecified lump in the right breast, unspecified quadrant: Secondary | ICD-10-CM

## 2022-03-31 DIAGNOSIS — Z86010 Personal history of colonic polyps: Secondary | ICD-10-CM | POA: Insufficient documentation

## 2022-03-31 DIAGNOSIS — Z1231 Encounter for screening mammogram for malignant neoplasm of breast: Secondary | ICD-10-CM

## 2022-03-31 DIAGNOSIS — R978 Other abnormal tumor markers: Secondary | ICD-10-CM | POA: Insufficient documentation

## 2022-03-31 DIAGNOSIS — K862 Cyst of pancreas: Secondary | ICD-10-CM | POA: Insufficient documentation

## 2022-03-31 DIAGNOSIS — Z800 Family history of malignant neoplasm of digestive organs: Secondary | ICD-10-CM | POA: Insufficient documentation

## 2022-03-31 DIAGNOSIS — Z7901 Long term (current) use of anticoagulants: Secondary | ICD-10-CM | POA: Insufficient documentation

## 2022-03-31 DIAGNOSIS — Z860101 Personal history of adenomatous and serrated colon polyps: Secondary | ICD-10-CM | POA: Insufficient documentation

## 2022-03-31 DIAGNOSIS — I2699 Other pulmonary embolism without acute cor pulmonale: Secondary | ICD-10-CM

## 2022-04-05 ENCOUNTER — Telehealth: Payer: Self-pay | Admitting: Gastroenterology

## 2022-04-05 NOTE — Telephone Encounter (Signed)
Ro did you call this pt?  I do not see any messages

## 2022-04-05 NOTE — Telephone Encounter (Signed)
Incoming call from patient states she is returning a call from [place].

## 2022-04-05 NOTE — Telephone Encounter (Signed)
I have advised the pt that no one from this office has called her.  She was told to call if she has any questions

## 2022-04-05 NOTE — Telephone Encounter (Signed)
I didn't. I sent a request for clearance to hold Xarelto, but it has not been sent back yet. Other than that I am not sure why she would be calling.

## 2022-04-15 ENCOUNTER — Ambulatory Visit
Admission: RE | Admit: 2022-04-15 | Discharge: 2022-04-15 | Disposition: A | Discharge status: Home / Self Care | Payer: Medicare Other | Source: Ambulatory Visit | Attending: Family Medicine | Admitting: Family Medicine

## 2022-04-15 DIAGNOSIS — N6310 Unspecified lump in the right breast, unspecified quadrant: Secondary | ICD-10-CM

## 2022-04-15 DIAGNOSIS — R922 Inconclusive mammogram: Secondary | ICD-10-CM | POA: Diagnosis not present

## 2022-04-15 DIAGNOSIS — N6489 Other specified disorders of breast: Secondary | ICD-10-CM | POA: Diagnosis not present

## 2022-05-03 ENCOUNTER — Telehealth: Payer: Self-pay

## 2022-05-03 NOTE — Telephone Encounter (Signed)
Received fax from [facility] questioning if pt can hold Xarelto for 2 days prior to endoscopy on 05/13/2022.   Inbasket message sent to Debbe Mounts, CMA per VOV Dr Marin Olp.   Patient to hold Xarelto for 2 days prior (1/23 & 1/24). Pt to resume Xarelto the day after endoscopy on 05/14/2022. dph

## 2022-05-03 NOTE — Telephone Encounter (Signed)
Dr. Rush Landmark - FYI  Per Antonietta Barcelona RN she spoke with Dr Marin Olp. He is okay with patient holding Xarelto x2 days (1/23 and 1/24) and wishes for patient to restart the day after endoscopy on 1/26. Patient has been informed and voiced understanding.

## 2022-05-03 NOTE — Telephone Encounter (Signed)
Thank you Rovonda for the update. Please ensure the patient knows about the holding of her Xarelto. More information after her EUS is completed. GM

## 2022-05-03 NOTE — Telephone Encounter (Signed)
Message was routed on 04/10/22 .  I have printed request and faxed to Dr. Marin Olp office today.

## 2022-05-06 ENCOUNTER — Encounter (HOSPITAL_COMMUNITY): Payer: Self-pay | Admitting: Gastroenterology

## 2022-05-06 NOTE — Progress Notes (Signed)
Attempted to obtain medical history via telephone, unable to reach at this time. HIPAA compliant voicemail message left requesting return call to pre surgical testing department.  

## 2022-05-12 NOTE — Anesthesia Preprocedure Evaluation (Signed)
Anesthesia Evaluation  Patient identified by MRN, date of birth, ID band Patient awake    Reviewed: Allergy & Precautions, H&P , NPO status , Patient's Chart, lab work & pertinent test results  Airway Mallampati: III  TM Distance: >3 FB Neck ROM: Full    Dental no notable dental hx. (+) Teeth Intact, Dental Advisory Given   Pulmonary sleep apnea and Continuous Positive Airway Pressure Ventilation    Pulmonary exam normal breath sounds clear to auscultation       Cardiovascular Exercise Tolerance: Good hypertension, Pt. on medications and Pt. on home beta blockers  Rhythm:Regular Rate:Normal     Neuro/Psych  Headaches  Anxiety Depression       GI/Hepatic negative GI ROS, Neg liver ROS,,,  Endo/Other  diabetes, Type 2, Oral Hypoglycemic Agents    Renal/GU negative Renal ROS  negative genitourinary   Musculoskeletal  (+) Arthritis , Osteoarthritis,    Abdominal   Peds  Hematology negative hematology ROS (+)   Anesthesia Other Findings   Reproductive/Obstetrics negative OB ROS                             Anesthesia Physical Anesthesia Plan  ASA: 3  Anesthesia Plan: MAC   Post-op Pain Management: Minimal or no pain anticipated   Induction: Intravenous  PONV Risk Score and Plan: 2 and Propofol infusion and Treatment may vary due to age or medical condition  Airway Management Planned: Natural Airway and Simple Face Mask  Additional Equipment:   Intra-op Plan:   Post-operative Plan:   Informed Consent: I have reviewed the patients History and Physical, chart, labs and discussed the procedure including the risks, benefits and alternatives for the proposed anesthesia with the patient or authorized representative who has indicated his/her understanding and acceptance.     Dental advisory given  Plan Discussed with: CRNA  Anesthesia Plan Comments:        Anesthesia Quick  Evaluation

## 2022-05-13 ENCOUNTER — Encounter (HOSPITAL_COMMUNITY): Payer: Self-pay | Admitting: Gastroenterology

## 2022-05-13 ENCOUNTER — Ambulatory Visit (HOSPITAL_COMMUNITY)
Admission: RE | Admit: 2022-05-13 | Discharge: 2022-05-13 | Disposition: A | Discharge status: Home / Self Care | Payer: Medicare Other | Attending: Gastroenterology | Admitting: Gastroenterology

## 2022-05-13 ENCOUNTER — Other Ambulatory Visit: Payer: Self-pay

## 2022-05-13 ENCOUNTER — Ambulatory Visit (HOSPITAL_COMMUNITY): Payer: Medicare Other | Admitting: Anesthesiology

## 2022-05-13 ENCOUNTER — Ambulatory Visit (HOSPITAL_BASED_OUTPATIENT_CLINIC_OR_DEPARTMENT_OTHER): Payer: Medicare Other | Admitting: Anesthesiology

## 2022-05-13 ENCOUNTER — Encounter (HOSPITAL_COMMUNITY)
Admission: RE | Disposition: A | Discharge status: Home / Self Care | Payer: Self-pay | Source: Home / Self Care | Attending: Gastroenterology

## 2022-05-13 DIAGNOSIS — Z86711 Personal history of pulmonary embolism: Secondary | ICD-10-CM | POA: Insufficient documentation

## 2022-05-13 DIAGNOSIS — Z800 Family history of malignant neoplasm of digestive organs: Secondary | ICD-10-CM | POA: Diagnosis not present

## 2022-05-13 DIAGNOSIS — K869 Disease of pancreas, unspecified: Secondary | ICD-10-CM | POA: Diagnosis not present

## 2022-05-13 DIAGNOSIS — Z7984 Long term (current) use of oral hypoglycemic drugs: Secondary | ICD-10-CM | POA: Insufficient documentation

## 2022-05-13 DIAGNOSIS — E785 Hyperlipidemia, unspecified: Secondary | ICD-10-CM | POA: Diagnosis not present

## 2022-05-13 DIAGNOSIS — F419 Anxiety disorder, unspecified: Secondary | ICD-10-CM | POA: Diagnosis not present

## 2022-05-13 DIAGNOSIS — K449 Diaphragmatic hernia without obstruction or gangrene: Secondary | ICD-10-CM | POA: Diagnosis not present

## 2022-05-13 DIAGNOSIS — M1990 Unspecified osteoarthritis, unspecified site: Secondary | ICD-10-CM | POA: Diagnosis not present

## 2022-05-13 DIAGNOSIS — Z79899 Other long term (current) drug therapy: Secondary | ICD-10-CM | POA: Diagnosis not present

## 2022-05-13 DIAGNOSIS — K3189 Other diseases of stomach and duodenum: Secondary | ICD-10-CM

## 2022-05-13 DIAGNOSIS — I10 Essential (primary) hypertension: Secondary | ICD-10-CM | POA: Insufficient documentation

## 2022-05-13 DIAGNOSIS — K209 Esophagitis, unspecified without bleeding: Secondary | ICD-10-CM

## 2022-05-13 DIAGNOSIS — K862 Cyst of pancreas: Secondary | ICD-10-CM | POA: Insufficient documentation

## 2022-05-13 DIAGNOSIS — Z7901 Long term (current) use of anticoagulants: Secondary | ICD-10-CM | POA: Insufficient documentation

## 2022-05-13 DIAGNOSIS — C7A1 Malignant poorly differentiated neuroendocrine tumors: Secondary | ICD-10-CM | POA: Diagnosis not present

## 2022-05-13 DIAGNOSIS — I899 Noninfective disorder of lymphatic vessels and lymph nodes, unspecified: Secondary | ICD-10-CM | POA: Insufficient documentation

## 2022-05-13 DIAGNOSIS — K222 Esophageal obstruction: Secondary | ICD-10-CM

## 2022-05-13 DIAGNOSIS — R933 Abnormal findings on diagnostic imaging of other parts of digestive tract: Secondary | ICD-10-CM | POA: Insufficient documentation

## 2022-05-13 DIAGNOSIS — K2970 Gastritis, unspecified, without bleeding: Secondary | ICD-10-CM | POA: Diagnosis not present

## 2022-05-13 DIAGNOSIS — E119 Type 2 diabetes mellitus without complications: Secondary | ICD-10-CM | POA: Insufficient documentation

## 2022-05-13 DIAGNOSIS — F32A Depression, unspecified: Secondary | ICD-10-CM | POA: Insufficient documentation

## 2022-05-13 DIAGNOSIS — G4730 Sleep apnea, unspecified: Secondary | ICD-10-CM | POA: Diagnosis not present

## 2022-05-13 DIAGNOSIS — I2699 Other pulmonary embolism without acute cor pulmonale: Secondary | ICD-10-CM

## 2022-05-13 DIAGNOSIS — Z86010 Personal history of colonic polyps: Secondary | ICD-10-CM

## 2022-05-13 HISTORY — PX: FINE NEEDLE ASPIRATION: SHX5430

## 2022-05-13 HISTORY — PX: ESOPHAGOGASTRODUODENOSCOPY (EGD) WITH PROPOFOL: SHX5813

## 2022-05-13 HISTORY — PX: UPPER ESOPHAGEAL ENDOSCOPIC ULTRASOUND (EUS): SHX6562

## 2022-05-13 HISTORY — PX: BIOPSY: SHX5522

## 2022-05-13 SURGERY — UPPER ESOPHAGEAL ENDOSCOPIC ULTRASOUND (EUS)
Anesthesia: Monitor Anesthesia Care

## 2022-05-13 MED ORDER — ONDANSETRON 4 MG PO TBDP: 4.0000 mg | ORAL_TABLET | Freq: Three times a day (TID) | ORAL | 0 refills | Status: DC | PRN

## 2022-05-13 MED ORDER — CIPROFLOXACIN HCL 500 MG PO TABS: 500.0000 mg | ORAL_TABLET | Freq: Two times a day (BID) | ORAL | 0 refills | Status: AC

## 2022-05-13 MED ORDER — CIPROFLOXACIN IN D5W 400 MG/200ML IV SOLN
INTRAVENOUS | Status: AC
  Filled 2022-05-13: qty 200

## 2022-05-13 MED ORDER — SODIUM CHLORIDE 0.9 % IV SOLN: INTRAVENOUS | Status: DC

## 2022-05-13 MED ORDER — OMEPRAZOLE 20 MG PO CPDR: 20.0000 mg | DELAYED_RELEASE_CAPSULE | Freq: Every day | ORAL | 6 refills | Status: DC

## 2022-05-13 MED ORDER — RIVAROXABAN 20 MG PO TABS: ORAL_TABLET | ORAL | 1 refills | Status: DC

## 2022-05-13 MED ORDER — ONDANSETRON HCL 4 MG/2ML IJ SOLN
4.0000 mg | Freq: Once | INTRAMUSCULAR | Status: AC
  Administered 2022-05-13: 4 mg via INTRAVENOUS

## 2022-05-13 MED ORDER — PROPOFOL 500 MG/50ML IV EMUL
INTRAVENOUS | Status: DC | PRN
  Administered 2022-05-13: 100 ug/kg/min via INTRAVENOUS
  Administered 2022-05-13: 50 mg via INTRAVENOUS

## 2022-05-13 MED ORDER — CIPROFLOXACIN IN D5W 400 MG/200ML IV SOLN
INTRAVENOUS | Status: DC | PRN
  Administered 2022-05-13: 400 mg via INTRAVENOUS

## 2022-05-13 MED ORDER — ONDANSETRON HCL 4 MG/2ML IJ SOLN
INTRAMUSCULAR | Status: AC
  Filled 2022-05-13: qty 2

## 2022-05-13 MED ORDER — LACTATED RINGERS IV SOLN: INTRAVENOUS | Status: DC

## 2022-05-13 NOTE — Anesthesia Postprocedure Evaluation (Signed)
Anesthesia Post Note  Patient: Monica Kane  Procedure(s) Performed: UPPER ESOPHAGEAL ENDOSCOPIC ULTRASOUND (EUS) BIOPSY ESOPHAGOGASTRODUODENOSCOPY (EGD) WITH PROPOFOL FINE NEEDLE ASPIRATION (FNA) LINEAR     Patient location during evaluation: Endoscopy Anesthesia Type: MAC Level of consciousness: awake and alert Pain management: pain level controlled Vital Signs Assessment: post-procedure vital signs reviewed and stable Respiratory status: spontaneous breathing, nonlabored ventilation and respiratory function stable Cardiovascular status: stable and blood pressure returned to baseline Postop Assessment: no apparent nausea or vomiting Anesthetic complications: no  No notable events documented.  Last Vitals:  Vitals:   05/13/22 0935 05/13/22 0940  BP:    Pulse: (!) 53 (!) 52  Resp: 12 16  Temp:    SpO2: 98% 93%    Last Pain:  Vitals:   05/13/22 0852  TempSrc:   PainSc: 0-No pain                 Deaunte Dente,W. EDMOND

## 2022-05-13 NOTE — Op Note (Signed)
[facility] Patient Name: Monica Kane Procedure Date: 05/13/2022 MRN: [id number] Attending MD: Justice Britain , MD, [id number] Date of Birth: 01-30-1946 CSN: [id number] Age: 77 Admit Type: Outpatient Procedure:                Upper EUS Indications:              Pancreatic cyst on MRCP, Elevated CA 19-9, FHx                            Pancreatic CA Providers:                Justice Britain, MD, Dulcy Fanny, [facility] Technician, Technician Referring MD:             Blanchard Mane. Allen Medicines:                Monitored Anesthesia Care Complications:            No immediate complications. Estimated Blood Loss:     Estimated blood loss was minimal. Procedure:                Pre-Anesthesia Assessment:                           - Prior to the procedure, a History and Physical                            was performed, and patient medications and                            allergies were reviewed. The patient's tolerance of                            previous anesthesia was also reviewed. The risks                            and benefits of the procedure and the sedation                            options and risks were discussed with the patient.                            All questions were answered, and informed consent                            was obtained. Prior Anticoagulants: The patient has                            taken Xarelto (rivaroxaban), last dose was 2 days                            prior to procedure. ASA Grade Assessment: III - A  patient with severe systemic disease. After                            reviewing the risks and benefits, the patient was                            deemed in satisfactory condition to undergo the                            procedure.                           After obtaining informed consent, the endoscope was                            passed under direct  vision. Throughout the                            procedure, the patient's blood pressure, pulse, and                            oxygen saturations were monitored continuously. The                            [id number]-H190 ([id number]) Olympus endoscope was introduced                            through the mouth, and advanced to the second part                            of duodenum. The [id number]-[id number] ([id number]) Olympus                            duodenoscope was introduced through the mouth, and                            advanced to the area of papilla. The [id number]                            ([id number]) Olympus linear ultrasound scope was                            introduced through the mouth, and advanced to the                            duodenum for ultrasound examination from the                            stomach and duodenum. The upper EUS was                            accomplished without difficulty. The patient  tolerated the procedure. Scope In: Scope Out: Findings:      ENDOSCOPIC FINDING: :      No gross lesions were noted in the majority of the esophagus.      LA Grade A (one or more mucosal breaks less than 5 mm, not extending       between tops of 2 mucosal folds) esophagitis with no bleeding was found       at the gastroesophageal junction.      A widely patent Schatzki ring was found at the gastroesophageal junction.      The Z-line was regular and was found 36 cm from the incisors.      A 3 cm hiatal hernia was present.      Localized moderate inflammation characterized by erosions, erythema and       friability was found in the gastric antrum and in the prepyloric region       of the stomach.      No other gross lesions were noted in the entire examined stomach.       Biopsies were taken with a cold forceps for histology and Helicobacter       pylori testing.      A single 8 mm submucosal nodule was found in the duodenal bulb. Biopsies       were  taken with a cold forceps for histology.      The major papilla was normal but did have evidence of fish-mouth       deformity and mucous secretions.      No other gross lesions were noted in the duodenal bulb, in the first       portion of the duodenum and in the second portion of the duodenum.      ENDOSONOGRAPHIC FINDING: :      Two multicystic lesions were identified in the genu of the pancreas and       pancreatic tail. The largest cyst measured 22 mm by 17 mm in the tail of       pancreas. The second cyst measured 13 mm by 21 mm. There was no       associated mass. Both of these cysts had evidence of PD communication.       Diagnostic needle aspiration for fluid was performed of the larger cyst.       Color Doppler imaging was utilized prior to needle puncture to confirm a       lack of significant vascular structures within the needle path. One pass       was made with the Expect 22 gauge needle using a transgastric approach.       No stylet was used. The amount of fluid collected was ~1 mL. The fluid       was opaque, serosanguinous and viscous. Sample sent for flucose, amylase       concentration, histology and CEA.      Pancreatic parenchymal abnormalities were noted in the entire pancreas.       These consisted of hyperechoic strands.      The pancreatic duct was visualized in the pancreatic head (2.5 mm), genu       of the pancreas (1.9 mm), body of the pancreas (2.2 mm) and tail of the       pancreas (2.3 mm). As noted above there was communication of the cystic       lesions to the PD in each of those areas of the neck and tail.  Endosonographic imaging of the ampulla showed no intramural       (subepithelial) lesion.      Endosonographic imaging in the common bile duct (2.7 mm -> 6.0 mm) and       in the common hepatic duct (8.2 mm) showed no stones with some       prominence of the CHD region.      Endosonographic imaging in the visualized portion of the liver showed a        small cyst measuring 4 mm by 4 mm, but no other mass-lesions.      No malignant-appearing lymph nodes were visualized in the celiac region       (level 20), peripancreatic region and porta hepatis region.      The celiac region was visualized. Impression:               EGD Impression:                           - No gross lesions in majority of esophagus. LA                            Grade A esophagitis with no bleeding very distally.                            Widely patent Schatzki ring. Z-line regular, 36 cm                            from the incisors.                           - 3 cm hiatal hernia.                           - Gastritis in antrum. No other gross lesions in                            the entire stomach. Biopsied.                           - Submucosal nodule found in the duodenum. Biopsied.                           - Normal major papilla though fishmouth deformity                            was noted with mucous secretion at times.                           - No other gross lesions in the duodenal bulb, in                            the first portion of the duodenum and in the second                            portion of the duodenum.  EUS Impression:                           - Multiple cystic lesions were seen in the genu of                            the pancreas and pancreatic tail. Cytology results                            are pending. However, the endosonographic                            appearance is suggestive of a branched intraductal                            papillary mucinous neoplasm. Fine needle aspiration                            for fluid performed of the largest cyst.                           - Pancreatic parenchymal abnormalities consisting                            of hyperechoic strands were noted in the entire                            pancreas.                           - The pancreatic duct was  visualized in the                            pancreatic head, genu of the pancreas, body of the                            pancreas and tail of the pancreas.                           - No malignant-appearing lymph nodes were                            visualized in the celiac region (level 20),                            peripancreatic region and porta hepatis region. Moderate Sedation:      Not Applicable - Patient had care per Anesthesia. Recommendation:           - The patient will be observed post-procedure,                            until all discharge criteria are met.                           - Discharge patient to home.                           -  Patient has a contact number available for                            emergencies. The signs and symptoms of potential                            delayed complications were discussed with the                            patient. Return to normal activities tomorrow.                            Written discharge instructions were provided to the                            patient.                           - Low fat diet.                           - Monitor for signs/symptoms of bleeding,                            perforation, and infection. If issues please call                            our number to get further assistance as needed.                           - Ciprofloxacin 500 mg twice daily for 3-days to                            decrease post-interventional infectious risk.                           - Start Omeprazole 20 mg daily (take 30 minutes                            before a meal).                           - Restart Xarelto in 48 hours (1/27) to decrease                            post-interventional bleeding risk.                           - Continue present medications.                           - Await cytology results and await path results.                           - Pending results, will discuss followup with  elevated CA19-9 and FHx likely consider repeat                            imaging in 54-month with MRI/MRCP.                           - The findings and recommendations were discussed                            with the patient.                           - The findings and recommendations were discussed                            with the designated responsible adult. Procedure Code(s):        --- Professional ---                           [id number] Esophagogastroduodenoscopy, flexible,                            transoral; with transendoscopic ultrasound-guided                            intramural or transmural fine needle                            aspiration/biopsy(s), (includes endoscopic                            ultrasound examination limited to the esophagus,                            stomach or duodenum, and adjacent structures)                           43239, 59, Esophagogastroduodenoscopy, flexible,                            transoral; with biopsy, single or multiple Diagnosis Code(s):        --- Professional ---                           K20.90, Esophagitis, unspecified without bleeding                           K22.2, Esophageal obstruction                           K44.9, Diaphragmatic hernia without obstruction or                            gangrene                           K29.70, Gastritis, unspecified, without bleeding  K31.89, Other diseases of stomach and duodenum                           K86.2, Cyst of pancreas                           K86.9, Disease of pancreas, unspecified                           I89.9, Noninfective disorder of lymphatic vessels                            and lymph nodes, unspecified                           R97.8, Other abnormal tumor markers                           R93.3, Abnormal findings on diagnostic imaging of                            other parts of digestive tract CPT copyright  2022 American Medical Association. All rights reserved. The codes documented in this report are preliminary and upon coder review may  be revised to meet current compliance requirements. Justice Britain, MD 05/13/2022 9:04:49 AM Number of Addenda: 0

## 2022-05-13 NOTE — H&P (Signed)
GASTROENTEROLOGY PROCEDURE H&P NOTE   Primary Care Physician: Darreld Mclean, MD  HPI: Monica Kane is a 77 y.o. female who presents for EGD/EUS to evaluate pancreatic cyst, elevated CA19-9 with FHx Pancreas cancer.  Past Medical History:  Diagnosis Date   Anxiety    takes Ativan nightly   Depression    takes Zoloft daily   History of colon polyps    History of kidney stones    History of staph infection 11/2013   Hyperlipidemia    takes Crestor nightly    Hypertension    takes Lisinopril and Coreg daily   Joint pain    Joint swelling    Neuropathy    takes Elavil nightly   Pneumonia    Pre-diabetes    Primary localized osteoarthritis of right knee    Pulmonary embolism ([place]) 07/08/2021   Sleep apnea    uses oral appliance   Past Surgical History:  Procedure Laterality Date   ABDOMINAL HYSTERECTOMY  04/20/1987   APPENDECTOMY     BREAST BIOPSY Left 02/16/2016   BREAST EXCISIONAL BIOPSY Right 2008   CARDIAC CATHETERIZATION  04/19/2013   CERVICAL SPINE SURGERY  04/19/2002   C 5-6   COLONOSCOPY     colonscopy     DIAGNOSTIC LAPAROSCOPY  04/19/1974   fibroid removed   ELBOW ARTHROSCOPY  2010, 2012   EYE SURGERY Bilateral    cataract   FOOT SURGERY Left 04/20/2011   GASTROCNEMIUS RECESSION Right 10/09/2019   Procedure: RIGHT GASTROCNEMIUS RECESSION;  Surgeon: Newt Minion, MD;  Location: [facility];  Service: Orthopedics;  Laterality: Right;   HYSTEROTOMY  1989   KNEE ARTHROSCOPY  01/17/2013   KNEE ARTHROSCOPY W/ MENISCECTOMY Right 01/31/2013   [place]   LATERAL EPICONDYLE RELEASE  03/01/2011   Procedure: TENNIS ELBOW RELEASE;  Surgeon: Lorn Junes, MD;  Location: [facility];  Service: Orthopedics;  Laterality: Left;  TENOTOMY ELBOW LATERAL EPICONDYLITIS TENNIS ELBOW   lateral release right elbow  04/19/2008   left foot surgery  04/20/2011   mortons neuroma   LEFT HEART CATHETERIZATION WITH CORONARY ANGIOGRAM N/A  07/23/2013   Procedure: LEFT HEART CATHETERIZATION WITH CORONARY ANGIOGRAM;  Surgeon: Sinclair Grooms, MD;  Location: [facility] CATH LAB;  Service: Cardiovascular;  Laterality: N/A;   PAROTID GLAND TUMOR EXCISION  04/19/1974   benign   PAROTID GLAND TUMOR EXCISION  04/19/1974   rt   SHOULDER ARTHROSCOPY DISTAL CLAVICLE EXCISION AND OPEN ROTATOR CUFF REPAIR  04/19/2004   SHOULDER ARTHROSCOPY W/ ROTATOR CUFF REPAIR  04/19/2004   TOTAL KNEE ARTHROPLASTY Right 02/18/2014   Procedure: TOTAL KNEE ARTHROPLASTY;  Surgeon: Lorn Junes, MD;  Location: [facility];  Service: Orthopedics;  Laterality: Right;   WEIL OSTEOTOMY Right 10/09/2019   Procedure: WEIL OSTEOTOMY RIGHT 2ND METATARSAL;  Surgeon: Newt Minion, MD;  Location: [facility];  Service: Orthopedics;  Laterality: Right;   Current Facility-Administered Medications  Medication Dose Route Frequency Provider Last Rate Last Admin   0.9 %  sodium chloride infusion   Intravenous Continuous Mansouraty, Telford Nab., MD       lactated ringers infusion   Intravenous Continuous Mansouraty, Telford Nab., MD 10 mL/hr at 05/13/22 0650 New Bag at 05/13/22 0650    Current Facility-Administered Medications:    0.9 %  sodium chloride infusion, , Intravenous, Continuous, Mansouraty, Telford Nab., MD   lactated ringers infusion, , Intravenous, Continuous, Mansouraty, Telford Nab., MD, Last Rate: 10 mL/hr at  05/13/22 0650, New Bag at 05/13/22 0650 Allergies  Allergen Reactions   Penicillins Swelling and Rash    Facial swelling   Family History  Problem Relation Age of Onset   Cancer Mother        lung   Cancer Father        lung; + tobacco   Diabetes Sister    Hypertension Sister    Hyperlipidemia Sister    Kidney failure Sister    Pancreatic cancer Maternal Aunt    Heart disease Maternal Grandfather        heart attack   Heart disease Paternal Grandfather        heart attack   Breast cancer Neg Hx    Colon cancer Neg Hx    Esophageal  cancer Neg Hx    Inflammatory bowel disease Neg Hx    Liver disease Neg Hx    Rectal cancer Neg Hx    Stomach cancer Neg Hx    Social History   Socioeconomic History   Marital status: Married    Spouse name: Leesa Leifheit   Number of children: Not on file   Years of education: Not on file   Highest education level: Not on file  Occupational History   Not on file  Tobacco Use   Smoking status: Never   Smokeless tobacco: Never  Vaping Use   Vaping Use: Never used  Substance and Sexual Activity   Alcohol use: Yes    Comment: social   Drug use: No   Sexual activity: Yes    Birth control/protection: Surgical  Other Topics Concern   Not on file  Social History Narrative   Lives with her husband.  He has 2 adult children. Patient has a Financial risk analyst, and she does exercise.   Social Determinants of Health   Financial Resource Strain: Low Risk  (09/28/2021)   Overall Financial Resource Strain (CARDIA)    Difficulty of Paying Living Expenses: Not hard at all  Food Insecurity: No Food Insecurity (09/28/2021)   Hunger Vital Sign    Worried About Running Out of Food in the Last Year: Never true    Ran Out of Food in the Last Year: Never true  Transportation Needs: No Transportation Needs (09/28/2021)   PRAPARE - Hydrologist (Medical): No    Lack of Transportation (Non-Medical): No  Physical Activity: Sufficiently Active (09/28/2021)   Exercise Vital Sign    Days of Exercise per Week: 3 days    Minutes of Exercise per Session: 60 min  Stress: No Stress Concern Present (09/28/2021)   [place]    Feeling of Stress : Not at all  Social Connections: Moderately Isolated (09/28/2021)   Social Connection and Isolation Panel [NHANES]    Frequency of Communication with Friends and Family: Twice a week    Frequency of Social Gatherings with Friends and Family: Once a week    Attends  Religious Services: Never    Marine scientist or Organizations: No    Attends Archivist Meetings: Never    Marital Status: Married  Human resources officer Violence: Not At Risk (09/28/2021)   Humiliation, Afraid, Rape, and Kick questionnaire    Fear of Current or Ex-Partner: No    Emotionally Abused: No    Physically Abused: No    Sexually Abused: No    Physical Exam: Today's Vitals   05/06/22 1240 05/13/22 0646  BP:  Marland Kitchen)  192/62  Resp:  14  Temp:  97.6 F (36.4 C)  TempSrc:  Temporal  SpO2:  98%  Weight: 73.9 kg 74.4 kg  Height:  5' 5.5" (1.664 m)  PainSc:  0-No pain   Body mass index is 26.88 kg/m. GEN: NAD EYE: Sclerae anicteric ENT: MMM CV: Non-tachycardic GI: Soft, NT/ND NEURO:  Alert & Oriented x 3  Lab Results: No results for input(s): "WBC", "HGB", "HCT", "PLT" in the last 72 hours. BMET No results for input(s): "NA", "K", "CL", "CO2", "GLUCOSE", "BUN", "CREATININE", "CALCIUM" in the last 72 hours. LFT No results for input(s): "PROT", "ALBUMIN", "AST", "ALT", "ALKPHOS", "BILITOT", "BILIDIR", "IBILI" in the last 72 hours. PT/INR No results for input(s): "LABPROT", "INR" in the last 72 hours.   Impression / Plan: This is a 77 y.o.female who presents for EGD/EUS to evaluate pancreatic cyst, elevated CA19-9 with FHx Pancreas cancer.  The risks of an EUS including intestinal perforation, bleeding, infection, aspiration, and medication effects were discussed as was the possibility it may not give a definitive diagnosis if a biopsy is performed.  When a biopsy of the pancreas is done as part of the EUS, there is an additional risk of pancreatitis at the rate of about 1-2%.  It was explained that procedure related pancreatitis is typically mild, although it can be severe and even life threatening, which is why we do not perform random pancreatic biopsies and only biopsy a lesion/area we feel is concerning enough to warrant the risk.   The risks and benefits  of endoscopic evaluation/treatment were discussed with the patient and/or family; these include but are not limited to the risk of perforation, infection, bleeding, missed lesions, lack of diagnosis, severe illness requiring hospitalization, as well as anesthesia and sedation related illnesses.  The patient's history has been reviewed, patient examined, no change in status, and deemed stable for procedure.  The patient and/or family is agreeable to proceed.    Justice Britain, MD [facility] Advanced [facility]oscopy Office # [phone]

## 2022-05-13 NOTE — Transfer of Care (Signed)
Immediate Anesthesia Transfer of Care Note  Patient: Monica Kane  Procedure(s) Performed: UPPER ESOPHAGEAL ENDOSCOPIC ULTRASOUND (EUS) BIOPSY ESOPHAGOGASTRODUODENOSCOPY (EGD) WITH PROPOFOL FINE NEEDLE ASPIRATION (FNA) LINEAR  Patient Location: Endoscopy Unit  Anesthesia Type:MAC  Level of Consciousness: awake, alert , and oriented  Airway & Oxygen Therapy: Patient Spontanous Breathing and Patient connected to face mask oxygen  Post-op Assessment: Report given to RN and Post -op Vital signs reviewed and stable  Post vital signs: Reviewed and stable  Last Vitals:  Vitals Value Taken Time  BP 122/78   Temp    Pulse 58 05/13/22 0842  Resp 18 05/13/22 0842  SpO2 100 % 05/13/22 0842  Vitals shown include unvalidated device data.  Last Pain:  Vitals:   05/13/22 0646  TempSrc: Temporal  PainSc: 0-No pain         Complications: No notable events documented.

## 2022-05-13 NOTE — Discharge Instructions (Signed)
YOU HAD AN ENDOSCOPIC PROCEDURE TODAY: Refer to the procedure report and other information in the discharge instructions given to you for any specific questions about what was found during the examination. If this information does not answer your questions, please call [facility] office at [phone] to clarify.  ° °YOU SHOULD EXPECT: Some feelings of bloating in the abdomen. Passage of more gas than usual. Walking can help get rid of the air that was put into your GI tract during the procedure and reduce the bloating. If you had a lower endoscopy (such as a colonoscopy or flexible sigmoidoscopy) you may notice spotting of blood in your stool or on the toilet paper. Some abdominal soreness may be present for a day or two, also. ° °DIET: Your first meal following the procedure should be a light meal and then it is ok to progress to your normal diet. A half-sandwich or bowl of soup is an example of a good first meal. Heavy or fried foods are harder to digest and may make you feel nauseous or bloated. Drink plenty of fluids but you should avoid alcoholic beverages for 24 hours. If you had a esophageal dilation, please see attached instructions for diet.   ° °ACTIVITY: Your care partner should take you home directly after the procedure. You should plan to take it easy, moving slowly for the rest of the day. You can resume normal activity the day after the procedure however YOU SHOULD NOT DRIVE, use power tools, machinery or perform tasks that involve climbing or major physical exertion for 24 hours (because of the sedation medicines used during the test).  ° °SYMPTOMS TO REPORT IMMEDIATELY: °A gastroenterologist can be reached at any hour. Please call [phone]  for any of the following symptoms:  °Following lower endoscopy (colonoscopy, flexible sigmoidoscopy) °Excessive amounts of blood in the stool  °Significant tenderness, worsening of abdominal pains  °Swelling of the abdomen that is new, acute  °Fever of 100° or  higher  °Following upper endoscopy (EGD, EUS, ERCP, esophageal dilation) °Vomiting of blood or coffee ground material  °New, significant abdominal pain  °New, significant chest pain or pain under the shoulder blades  °Painful or persistently difficult swallowing  °New shortness of breath  °Black, tarry-looking or red, bloody stools ° °FOLLOW UP:  °If any biopsies were taken you will be contacted by phone or by letter within the next 1-3 weeks. Call [phone]  if you have not heard about the biopsies in 3 weeks.  °Please also call with any specific questions about appointments or follow up tests. ° °

## 2022-05-14 ENCOUNTER — Encounter (HOSPITAL_COMMUNITY): Payer: Self-pay | Admitting: Gastroenterology

## 2022-05-14 ENCOUNTER — Other Ambulatory Visit: Payer: Self-pay | Admitting: Family Medicine

## 2022-05-14 DIAGNOSIS — E119 Type 2 diabetes mellitus without complications: Secondary | ICD-10-CM

## 2022-05-17 ENCOUNTER — Encounter: Payer: Self-pay | Admitting: Gastroenterology

## 2022-05-17 LAB — SURGICAL PATHOLOGY

## 2022-05-18 ENCOUNTER — Other Ambulatory Visit: Payer: Self-pay

## 2022-05-18 ENCOUNTER — Telehealth: Payer: Self-pay | Admitting: Gastroenterology

## 2022-05-18 DIAGNOSIS — D3A8 Other benign neuroendocrine tumors: Secondary | ICD-10-CM

## 2022-05-18 DIAGNOSIS — K862 Cyst of pancreas: Secondary | ICD-10-CM

## 2022-05-18 NOTE — Telephone Encounter (Signed)
Patient is calling about EGD at [facility] it is scheduled for 3/28 but was told by Dr. Rush Landmark it would be on 3/7 and she is a little confused. Please advise

## 2022-05-18 NOTE — Telephone Encounter (Signed)
The pt has been advised that 3/28 is the next available date that GM has.  She is ok with leaving the appt as scheduled and all questions answered to the best of my ability.

## 2022-06-01 DIAGNOSIS — K8689 Other specified diseases of pancreas: Secondary | ICD-10-CM | POA: Diagnosis not present

## 2022-06-01 DIAGNOSIS — K862 Cyst of pancreas: Secondary | ICD-10-CM | POA: Diagnosis not present

## 2022-06-03 ENCOUNTER — Encounter: Payer: Self-pay | Admitting: Gastroenterology

## 2022-06-03 ENCOUNTER — Other Ambulatory Visit: Payer: Medicare Other

## 2022-06-03 DIAGNOSIS — K862 Cyst of pancreas: Secondary | ICD-10-CM

## 2022-06-03 DIAGNOSIS — D3A8 Other benign neuroendocrine tumors: Secondary | ICD-10-CM

## 2022-06-03 NOTE — Telephone Encounter (Signed)
Dr Rush Landmark have you seen cytology results?

## 2022-06-03 NOTE — Telephone Encounter (Signed)
Patty, Please let the patient know, that I will review within the next week any results that have come to me as I have so many that are pending since my time away.  Thanks. GM

## 2022-06-06 ENCOUNTER — Encounter: Payer: Self-pay | Admitting: Gastroenterology

## 2022-06-06 NOTE — Progress Notes (Signed)
[facility] pancreatic fluid analysis  CEA 767 ng/mL Amylase 9149 units/L Glucose less than 43.2 mg/dL Acellular cytology DNA quantity = elevated quantity DNA quality = good-quality KRAS = high clonality G12 D Tumor suppressor genes (LOH) = no LOH detected  Based on the PancraGEN analysis, there is a 97% probability of benign disease over the next 3 years.  But as the patient has some molecular alterations this will need to be monitored  This cyst is most consistent with an IPMN as it was on the EUS as well.  Based on the patient's family history of pancreas cancer, her elevated CA 19-9, I do think she needs to be monitored closely.  She does not meet true criteria for high risk pancreatic cancer screening, but we will need to monitor her closely.  I will discuss these results with her surgeon, Dr. Zenia Resides.  I recommend that we repeat her CA 19-9 and a MRI/MRCP at 6 months from the time of EUS.  We will update the patient about the preliminary recommendations I will forward these results to Dr. Zenia Resides as well.  We will update the patient further if any other changes in our plan of actions occur.  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  She still needs to obtain a chromogranin A level as able.   We will update the patient and get these results scanned into the chart as well as mailed to her for her records.   Justice Britain, MD [facility] Office # [id number]

## 2022-06-07 NOTE — Progress Notes (Signed)
Left message on machine to call back  

## 2022-06-07 NOTE — Progress Notes (Signed)
The returned call and has been advised of the results and recommendations.  She agrees to the 4-6 month follow up labs and imaging.  We will call pt at that time.  She will keep appt as planned for procedures in March.

## 2022-06-08 LAB — CHROMOGRANIN A: Chromogranin A (ng/mL): 47.7 ng/mL (ref 0.0–101.8)

## 2022-06-17 ENCOUNTER — Other Ambulatory Visit: Payer: Self-pay | Admitting: Family Medicine

## 2022-06-21 NOTE — Progress Notes (Signed)
SA, Thanks for the follow-up. GM

## 2022-06-24 ENCOUNTER — Other Ambulatory Visit: Payer: Self-pay | Admitting: Family Medicine

## 2022-06-24 DIAGNOSIS — E785 Hyperlipidemia, unspecified: Secondary | ICD-10-CM

## 2022-06-29 ENCOUNTER — Encounter: Payer: Self-pay | Admitting: Family Medicine

## 2022-06-29 ENCOUNTER — Other Ambulatory Visit: Payer: Self-pay | Admitting: Family Medicine

## 2022-06-29 DIAGNOSIS — G4700 Insomnia, unspecified: Secondary | ICD-10-CM

## 2022-07-15 ENCOUNTER — Ambulatory Visit (HOSPITAL_BASED_OUTPATIENT_CLINIC_OR_DEPARTMENT_OTHER): Payer: Medicare Other | Admitting: Anesthesiology

## 2022-07-15 ENCOUNTER — Ambulatory Visit (HOSPITAL_COMMUNITY)
Admission: RE | Admit: 2022-07-15 | Discharge: 2022-07-15 | Disposition: A | Discharge status: Home / Self Care | Payer: Medicare Other | Attending: Gastroenterology | Admitting: Gastroenterology

## 2022-07-15 ENCOUNTER — Other Ambulatory Visit: Payer: Self-pay

## 2022-07-15 ENCOUNTER — Ambulatory Visit (HOSPITAL_COMMUNITY): Payer: Medicare Other | Admitting: Anesthesiology

## 2022-07-15 ENCOUNTER — Encounter (HOSPITAL_COMMUNITY): Payer: Self-pay | Admitting: Gastroenterology

## 2022-07-15 ENCOUNTER — Encounter (HOSPITAL_COMMUNITY)
Admission: RE | Disposition: A | Discharge status: Home / Self Care | Payer: Self-pay | Source: Home / Self Care | Attending: Gastroenterology

## 2022-07-15 DIAGNOSIS — I899 Noninfective disorder of lymphatic vessels and lymph nodes, unspecified: Secondary | ICD-10-CM

## 2022-07-15 DIAGNOSIS — I2699 Other pulmonary embolism without acute cor pulmonale: Secondary | ICD-10-CM

## 2022-07-15 DIAGNOSIS — E119 Type 2 diabetes mellitus without complications: Secondary | ICD-10-CM | POA: Insufficient documentation

## 2022-07-15 DIAGNOSIS — K449 Diaphragmatic hernia without obstruction or gangrene: Secondary | ICD-10-CM

## 2022-07-15 DIAGNOSIS — K862 Cyst of pancreas: Secondary | ICD-10-CM

## 2022-07-15 DIAGNOSIS — G4730 Sleep apnea, unspecified: Secondary | ICD-10-CM | POA: Insufficient documentation

## 2022-07-15 DIAGNOSIS — I10 Essential (primary) hypertension: Secondary | ICD-10-CM

## 2022-07-15 DIAGNOSIS — D3A8 Other benign neuroendocrine tumors: Secondary | ICD-10-CM

## 2022-07-15 DIAGNOSIS — K3189 Other diseases of stomach and duodenum: Secondary | ICD-10-CM

## 2022-07-15 DIAGNOSIS — Z8719 Personal history of other diseases of the digestive system: Secondary | ICD-10-CM | POA: Diagnosis not present

## 2022-07-15 DIAGNOSIS — Z86711 Personal history of pulmonary embolism: Secondary | ICD-10-CM | POA: Diagnosis not present

## 2022-07-15 DIAGNOSIS — F418 Other specified anxiety disorders: Secondary | ICD-10-CM | POA: Diagnosis not present

## 2022-07-15 HISTORY — PX: EUS: SHX5427

## 2022-07-15 HISTORY — PX: SUBMUCOSAL LIFTING INJECTION: SHX6855

## 2022-07-15 HISTORY — PX: HEMOSTASIS CLIP PLACEMENT: SHX6857

## 2022-07-15 HISTORY — PX: ESOPHAGOGASTRODUODENOSCOPY (EGD) WITH PROPOFOL: SHX5813

## 2022-07-15 SURGERY — ESOPHAGOGASTRODUODENOSCOPY (EGD) WITH PROPOFOL
Anesthesia: Monitor Anesthesia Care

## 2022-07-15 MED ORDER — OMEPRAZOLE 20 MG PO CPDR: 20.0000 mg | DELAYED_RELEASE_CAPSULE | Freq: Two times a day (BID) | ORAL | 6 refills | Status: DC

## 2022-07-15 MED ORDER — ONDANSETRON HCL 4 MG/2ML IJ SOLN
INTRAMUSCULAR | Status: AC
  Filled 2022-07-15: qty 2

## 2022-07-15 MED ORDER — SODIUM CHLORIDE 0.9 % IV SOLN: INTRAVENOUS | Status: DC

## 2022-07-15 MED ORDER — LACTATED RINGERS IV SOLN: INTRAVENOUS | Status: DC

## 2022-07-15 MED ORDER — PROPOFOL 500 MG/50ML IV EMUL
INTRAVENOUS | Status: DC | PRN
  Administered 2022-07-15: 130 ug/kg/min via INTRAVENOUS

## 2022-07-15 MED ORDER — PROPOFOL 1000 MG/100ML IV EMUL
INTRAVENOUS | Status: AC
  Filled 2022-07-15: qty 100

## 2022-07-15 MED ORDER — RIVAROXABAN 20 MG PO TABS: ORAL_TABLET | ORAL | 1 refills | Status: DC

## 2022-07-15 MED ORDER — ONDANSETRON HCL 4 MG/2ML IJ SOLN
4.0000 mg | Freq: Once | INTRAMUSCULAR | Status: AC
  Administered 2022-07-15: 4 mg via INTRAVENOUS

## 2022-07-15 SURGICAL SUPPLY — 15 items

## 2022-07-15 NOTE — Anesthesia Preprocedure Evaluation (Signed)
Anesthesia Evaluation  Patient identified by MRN, date of birth, ID band Patient awake    Reviewed: Allergy & Precautions, NPO status , Patient's Chart, lab work & pertinent test results  Airway Mallampati: II  TM Distance: >3 FB Neck ROM: Full    Dental no notable dental hx.    Pulmonary sleep apnea , PE   Pulmonary exam normal        Cardiovascular hypertension, Pt. on medications and Pt. on home beta blockers  Rhythm:Regular Rate:Normal     Neuro/Psych  Headaches  Anxiety Depression       GI/Hepatic Neg liver ROS,GERD  Medicated,,  Endo/Other  diabetes    Renal/GU   negative genitourinary   Musculoskeletal  (+) Arthritis , Osteoarthritis,    Abdominal Normal abdominal exam  (+)   Peds  Hematology negative hematology ROS (+)   Anesthesia Other Findings   Reproductive/Obstetrics                             Anesthesia Physical Anesthesia Plan  ASA: 3  Anesthesia Plan: MAC   Post-op Pain Management:    Induction:   PONV Risk Score and Plan: 2 and Propofol infusion and Treatment may vary due to age or medical condition  Airway Management Planned: Simple Face Mask and Nasal Cannula  Additional Equipment: None  Intra-op Plan:   Post-operative Plan:   Informed Consent: I have reviewed the patients History and Physical, chart, labs and discussed the procedure including the risks, benefits and alternatives for the proposed anesthesia with the patient or authorized representative who has indicated his/her understanding and acceptance.     Dental advisory given  Plan Discussed with:   Anesthesia Plan Comments:        Anesthesia Quick Evaluation

## 2022-07-15 NOTE — Anesthesia Postprocedure Evaluation (Signed)
Anesthesia Post Note  Patient: Monica Kane  Procedure(s) Performed: ESOPHAGOGASTRODUODENOSCOPY (EGD) WITH PROPOFOL UPPER ENDOSCOPIC ULTRASOUND (EUS) RADIAL SUBMUCOSAL LIFTING INJECTION BIOPSY HEMOSTASIS CLIP PLACEMENT     Patient location during evaluation: Endoscopy Anesthesia Type: MAC Level of consciousness: awake and alert Pain management: pain level controlled Vital Signs Assessment: post-procedure vital signs reviewed and stable Respiratory status: spontaneous breathing, nonlabored ventilation, respiratory function stable and patient connected to nasal cannula oxygen Cardiovascular status: stable and blood pressure returned to baseline Postop Assessment: no apparent nausea or vomiting Anesthetic complications: no   No notable events documented.  Last Vitals:  Vitals:   07/15/22 1226 07/15/22 1231  BP: (!) 135/58 (!) 141/60  Pulse: 61 60  Resp: 16 15  Temp:    SpO2: 99% 95%    Last Pain:  Vitals:   07/15/22 1231  TempSrc:   PainSc: 0-No pain                 Belenda Cruise P Friend Dorfman

## 2022-07-15 NOTE — H&P (Signed)
GASTROENTEROLOGY PROCEDURE H&P NOTE   Primary Care Physician: Darreld Mclean, MD  HPI: Monica Kane is a 77 y.o. female who presents for EGD/EUS with attempted EMR of the duodenal bulb neuroendocrine tumor.  Past Medical History:  Diagnosis Date   Anxiety    takes Ativan nightly   Depression    takes Zoloft daily   History of colon polyps    History of kidney stones    History of staph infection 11/2013   Hyperlipidemia    takes Crestor nightly    Hypertension    takes Lisinopril and Coreg daily   Joint pain    Joint swelling    Neuropathy    takes Elavil nightly   Pneumonia    Pre-diabetes    Primary localized osteoarthritis of right knee    Pulmonary embolism ([place]) 07/08/2021   Sleep apnea    uses oral appliance   Past Surgical History:  Procedure Laterality Date   ABDOMINAL HYSTERECTOMY  04/20/1987   APPENDECTOMY     BIOPSY  05/13/2022   Procedure: BIOPSY;  Surgeon: Irving Copas., MD;  Location: [facility];  Service: Gastroenterology;;   BREAST BIOPSY Left 02/16/2016   BREAST EXCISIONAL BIOPSY Right 2008   CARDIAC CATHETERIZATION  04/19/2013   CERVICAL SPINE SURGERY  04/19/2002   C 5-6   COLONOSCOPY     colonscopy     DIAGNOSTIC LAPAROSCOPY  04/19/1974   fibroid removed   ELBOW ARTHROSCOPY  2010, 2012   ESOPHAGOGASTRODUODENOSCOPY (EGD) WITH PROPOFOL N/A 05/13/2022   Procedure: ESOPHAGOGASTRODUODENOSCOPY (EGD) WITH PROPOFOL;  Surgeon: Irving Copas., MD;  Location: [facility];  Service: Gastroenterology;  Laterality: N/A;   EYE SURGERY Bilateral    cataract   FINE NEEDLE ASPIRATION N/A 05/13/2022   Procedure: FINE NEEDLE ASPIRATION (FNA) LINEAR;  Surgeon: Irving Copas., MD;  Location: [facility];  Service: Gastroenterology;  Laterality: N/A;   FOOT SURGERY Left 04/20/2011   GASTROCNEMIUS RECESSION Right 10/09/2019   Procedure: RIGHT GASTROCNEMIUS RECESSION;  Surgeon: Newt Minion, MD;  Location: [place];  Service: Orthopedics;  Laterality: Right;   HYSTEROTOMY  1989   KNEE ARTHROSCOPY  01/17/2013   KNEE ARTHROSCOPY W/ MENISCECTOMY Right 01/31/2013   [place]   LATERAL EPICONDYLE RELEASE  03/01/2011   Procedure: TENNIS ELBOW RELEASE;  Surgeon: Lorn Junes, MD;  Location: [place];  Service: Orthopedics;  Laterality: Left;  TENOTOMY ELBOW LATERAL EPICONDYLITIS TENNIS ELBOW   lateral release right elbow  04/19/2008   left foot surgery  04/20/2011   mortons neuroma   LEFT HEART CATHETERIZATION WITH CORONARY ANGIOGRAM N/A 07/23/2013   Procedure: LEFT HEART CATHETERIZATION WITH CORONARY ANGIOGRAM;  Surgeon: Sinclair Grooms, MD;  Location: [facility];  Service: Cardiovascular;  Laterality: N/A;   PAROTID GLAND TUMOR EXCISION  04/19/1974   benign   PAROTID GLAND TUMOR EXCISION  04/19/1974   rt   SHOULDER ARTHROSCOPY DISTAL CLAVICLE EXCISION AND OPEN ROTATOR CUFF REPAIR  04/19/2004   SHOULDER ARTHROSCOPY W/ ROTATOR CUFF REPAIR  04/19/2004   TOTAL KNEE ARTHROPLASTY Right 02/18/2014   Procedure: TOTAL KNEE ARTHROPLASTY;  Surgeon: Lorn Junes, MD;  Location: [place];  Service: Orthopedics;  Laterality: Right;   UPPER ESOPHAGEAL ENDOSCOPIC ULTRASOUND (EUS) N/A 05/13/2022   Procedure: UPPER ESOPHAGEAL ENDOSCOPIC ULTRASOUND (EUS);  Surgeon: Irving Copas., MD;  Location: [facility];  Service: Gastroenterology;  Laterality: N/A;   WEIL OSTEOTOMY Right 10/09/2019   Procedure: WEIL OSTEOTOMY RIGHT 2ND  METATARSAL;  Surgeon: Newt Minion, MD;  Location: [place];  Service: Orthopedics;  Laterality: Right;   Current Facility-Administered Medications  Medication Dose Route Frequency Provider Last Rate Last Admin   0.9 %  sodium chloride infusion   Intravenous Continuous Mansouraty, Telford Nab., MD       lactated ringers infusion   Intravenous Continuous Mansouraty, Telford Nab., MD 10 mL/hr at 07/15/22 1036 New Bag at 07/15/22 1036    Current  Facility-Administered Medications:    0.9 %  sodium chloride infusion, , Intravenous, Continuous, Mansouraty, Telford Nab., MD   lactated ringers infusion, , Intravenous, Continuous, Mansouraty, Telford Nab., MD, Last Rate: 10 mL/hr at 07/15/22 1036, New Bag at 07/15/22 1036 Allergies  Allergen Reactions   Penicillins Swelling and Rash    Facial swelling   Family History  Problem Relation Age of Onset   Cancer Mother        lung   Cancer Father        lung; + tobacco   Diabetes Sister    Hypertension Sister    Hyperlipidemia Sister    Kidney failure Sister    Pancreatic cancer Maternal Aunt    Heart disease Maternal Grandfather        heart attack   Heart disease Paternal Grandfather        heart attack   Breast cancer Neg Hx    Colon cancer Neg Hx    Esophageal cancer Neg Hx    Inflammatory bowel disease Neg Hx    Liver disease Neg Hx    Rectal cancer Neg Hx    Stomach cancer Neg Hx    Social History   Socioeconomic History   Marital status: Married    Spouse name: Tinzley Kershaw   Number of children: Not on file   Years of education: Not on file   Highest education level: Not on file  Occupational History   Not on file  Tobacco Use   Smoking status: Never   Smokeless tobacco: Never  Vaping Use   Vaping Use: Never used  Substance and Sexual Activity   Alcohol use: Yes    Comment: social   Drug use: No   Sexual activity: Yes    Birth control/protection: Surgical  Other Topics Concern   Not on file  Social History Narrative   Lives with her husband.  He has 2 adult children. Patient has a Financial risk analyst, and she does exercise.   Social Determinants of Health   Financial Resource Strain: Low Risk  (09/28/2021)   Overall Financial Resource Strain (CARDIA)    Difficulty of Paying Living Expenses: Not hard at all  Food Insecurity: No Food Insecurity (09/28/2021)   Hunger Vital Sign    Worried About Running Out of Food in the Last Year: Never true    Ran Out  of Food in the Last Year: Never true  Transportation Needs: No Transportation Needs (09/28/2021)   PRAPARE - Hydrologist (Medical): No    Lack of Transportation (Non-Medical): No  Physical Activity: Sufficiently Active (09/28/2021)   Exercise Vital Sign    Days of Exercise per Week: 3 days    Minutes of Exercise per Session: 60 min  Stress: No Stress Concern Present (09/28/2021)   [place]    Feeling of Stress : Not at all  Social Connections: Moderately Isolated (09/28/2021)   Social Connection and Isolation Panel [NHANES]  Frequency of Communication with Friends and Family: Twice a week    Frequency of Social Gatherings with Friends and Family: Once a week    Attends Religious Services: Never    Marine scientist or Organizations: No    Attends Archivist Meetings: Never    Marital Status: Married  Human resources officer Violence: Not At Risk (09/28/2021)   Humiliation, Afraid, Rape, and Kick questionnaire    Fear of Current or Ex-Partner: No    Emotionally Abused: No    Physically Abused: No    Sexually Abused: No    Physical Exam: Today's Vitals   07/15/22 1026  BP: (!) 152/65  Pulse: (!) 59  Resp: 11  Temp: (!) 97.3 F (36.3 C)  TempSrc: Temporal  SpO2: 100%  Weight: 72.6 kg  Height: 5\' 5"  (1.651 m)  PainSc: 0-No pain   Body mass index is 26.63 kg/m. GEN: NAD EYE: Sclerae anicteric ENT: MMM CV: Non-tachycardic GI: Soft, NT/ND NEURO:  Alert & Oriented x 3  Lab Results: No results for input(s): "WBC", "HGB", "HCT", "PLT" in the last 72 hours. BMET No results for input(s): "NA", "K", "CL", "CO2", "GLUCOSE", "BUN", "CREATININE", "CALCIUM" in the last 72 hours. LFT No results for input(s): "PROT", "ALBUMIN", "AST", "ALT", "ALKPHOS", "BILITOT", "BILIDIR", "IBILI" in the last 72 hours. PT/INR No results for input(s): "LABPROT", "INR" in the last 72  hours.   Impression / Plan: This is a 77 y.o.female  who presents for EGD/EUS with attempted EMR of the duodenal bulb neuroendocrine tumor.  The risks of an EUS including intestinal perforation, bleeding, infection, aspiration, and medication effects were discussed as was the possibility it may not give a definitive diagnosis if a biopsy is performed.  When a biopsy of the pancreas is done as part of the EUS, there is an additional risk of pancreatitis at the rate of about 1-2%.  It was explained that procedure related pancreatitis is typically mild, although it can be severe and even life threatening, which is why we do not perform random pancreatic biopsies and only biopsy a lesion/area we feel is concerning enough to warrant the risk.   The risks and benefits of endoscopic evaluation/treatment were discussed with the patient and/or family; these include but are not limited to the risk of perforation, infection, bleeding, missed lesions, lack of diagnosis, severe illness requiring hospitalization, as well as anesthesia and sedation related illnesses.  The patient's history has been reviewed, patient examined, no change in status, and deemed stable for procedure.  The patient and/or family is agreeable to proceed.    Justice Britain, MD [facility] # [phone]

## 2022-07-15 NOTE — Op Note (Signed)
[facility] Patient Name: Monica Kane Procedure Date: 07/15/2022 MRN: [id number] Attending MD: Justice Britain , MD, [id number] Date of Birth: 05-May-1945 CSN: [id number] Age: 77 Admit Type: Outpatient Procedure:                Upper EUS Indications:              Duodenal deformity on endoscopy/Subepithelial tumor                            vs. extrinsic compression, Neuroendocrine Tumor Providers:                Justice Britain, MD, Jeanella Cara, RN,                            Cletis Athens, Technician Referring MD:             Blanchard Mane. Elonda Husky C. Copland MD, MD Medicines:                Monitored Anesthesia Care Complications:            No immediate complications. Estimated Blood Loss:     Estimated blood loss was minimal. Procedure:                Pre-Anesthesia Assessment:                           - Prior to the procedure, a History and Physical                            was performed, and patient medications and                            allergies were reviewed. The patient's tolerance of                            previous anesthesia was also reviewed. The risks                            and benefits of the procedure and the sedation                            options and risks were discussed with the patient.                            All questions were answered, and informed consent                            was obtained. Prior Anticoagulants: The patient has                            taken Xarelto (rivaroxaban), last dose was 2 days                            prior to procedure. ASA Grade Assessment: III - A  patient with severe systemic disease. After                            reviewing the risks and benefits, the patient was                            deemed in satisfactory condition to undergo the                            procedure.                           After obtaining informed consent, the  endoscope was                            passed under direct vision. Throughout the                            procedure, the patient's blood pressure, pulse, and                            oxygen saturations were monitored continuously. The                            [id number] Olympus therapeutic endoscope                            was introduced through the mouth, and advanced to                            the second part of duodenum. The [id number]                            [id number]) Olympus radial ultrasound scope was                            introduced through the mouth, and advanced to the                            duodenum for ultrasound examination from the                            stomach and duodenum. The upper EUS was                            accomplished without difficulty. The patient                            tolerated the procedure. Scope In: Scope Out: Findings:      ENDOSCOPIC FINDING: :      No gross lesions were noted in the entire esophagus.      The Z-line was regular.      A 3 cm hiatal hernia was present.      Patchy mildly erythematous mucosa without bleeding was found in the  entire examined stomach (previously biopsied and negative for H. pylori).      A small scar was found in the duodenal bulb. There was no evidence of       the previous subepithelial/polypoid lesion (previously biopsied as a       duodenal neuroendocrine tumor). After completion of the EUS, an attempt       at further mucosal resection of the scar was performed. I subsequently       injected 3 mL EverLift for lesion assessment, but the lesion could not       be lifted adequately as a result of the scarring. I did subsequently try       to place a few bands around the area through suction via endoscope but       this did not successfully occur. No tissue was resected. Although the       tissue itself looked to be benign without evidence of true subepithelial        lesion still being present, I decided to proceed with forcep avulsion.       Fulguration to ablate the scar was successful.. To prevent bleeding       post-intervention, two hemostatic clips were successfully placed (MR       conditional). Clip manufacturer: [employer]. There was no       bleeding at the end of the procedure.      No other gross lesions were noted in the duodenal bulb, in the first       portion of the duodenum and in the second portion of the duodenum.      ENDOSONOGRAPHIC FINDING: :      Endosonographic imaging in the duodenal bulb, in the apex of the       duodenal bulb and in the second portion of the duodenum showed no       intramural (subepithelial) lesion.      Endosonographic imaging in the pylorus showed no intramural       (subepithelial) lesion.      No malignant-appearing lymph nodes were visualized in the left gastric       region (level 17), gastrohepatic ligament (level 18) and celiac region       (level 20).      Endosonographic imaging in the visualized portion of the liver showed no       mass.      The celiac region was visualized. Impression:               EGD impression:                           - No gross lesions in the entire esophagus. Z-line                            regular.                           -3 cm hiatal hernia noted.                           - Erythematous mucosa in the stomach. Previously                            biopsied and negative for H. pylori.                           -  Duodenal scar at site of previous neuroendocrine                            tumor. No evidence of persisting disease present                            endoscopically. After EUS, I attempted resection of                            the scar but was not able to lift or place a band                            around the scar. Proceeded with forcep avulsion                            which was successful. Clips (MR conditional) were                             placed. Clip manufacturer: [employer].                           - No other gross lesions in the duodenal bulb, in                            the first portion of the duodenum and in the second                            portion of the duodenum.                           EUS impression:                           - No evidence of any intramural lesions in the                            duodenal bulb/apex/second portion of the duodenum                            were noted.                           - No evidence of any intramural lesion noted in the                            pylorus.                           - No malignant-appearing lymph nodes were                            visualized in the left gastric region (level 17),  gastrohepatic ligament (level 18) and celiac region                            (level 20). Moderate Sedation:      Not Applicable - Patient had care per Anesthesia. Recommendation:           - The patient will be observed post-procedure,                            until all discharge criteria are met.                           - Discharge patient to home.                           - Patient has a contact number available for                            emergencies. The signs and symptoms of potential                            delayed complications were discussed with the                            patient. Return to normal activities tomorrow.                            Written discharge instructions were provided to the                            patient.                           - Full liquid diet today but may transition back to                            soft diet/regular diet in the coming days starting                            tomorrow.                           - Observe patient's clinical course.                           - Await path results.                           - Monitor for signs/symptoms of bleeding,                             perforation, and infection. If issues please call                            our number to get further assistance as needed.                           -  May restart Xarelto in 48 hours (07/17/2022) to                            decrease risk of post interventional bleeding.                            Continue present medications otherwise.                           - Repeat the upper endoscopic ultrasound for                            surveillance will be based on the pathology. If                            there is no evidence of any persisting                            neuroendocrine tumor, then we will just monitor                            this area within the next 6 to 12 months.                           - The findings and recommendations were discussed                            with the patient.                           - The findings and recommendations were discussed                            with the designated responsible adult. Procedure Code(s):        --- Professional ---                           [phone], 59, Esophagogastroduodenoscopy, flexible,                            transoral; with endoscopic mucosal resection                           [id number], Esophagogastroduodenoscopy, flexible,                            transoral; with ablation of tumor(s), polyp(s), or                            other lesion(s) (includes pre- and post-dilation                            and guide wire passage, when performed)  [id number], Esophagogastroduodenoscopy, flexible,                            transoral; with endoscopic ultrasound examination                            limited to the esophagus, stomach or duodenum, and                            adjacent structures Diagnosis Code(s):        --- Professional ---                           K31.89, Other diseases of stomach and duodenum                           I89.9, Noninfective disorder of  lymphatic vessels                            and lymph nodes, unspecified CPT copyright 2022 American Medical Association. All rights reserved. The codes documented in this report are preliminary and upon coder review may  be revised to meet current compliance requirements. Justice Britain, MD 07/15/2022 12:28:04 PM Number of Addenda: 0

## 2022-07-15 NOTE — Transfer of Care (Signed)
Immediate Anesthesia Transfer of Care Note  Patient: Monica Kane  Procedure(s) Performed: ESOPHAGOGASTRODUODENOSCOPY (EGD) WITH PROPOFOL UPPER ENDOSCOPIC ULTRASOUND (EUS) RADIAL SUBMUCOSAL LIFTING INJECTION BIOPSY HEMOSTASIS CLIP PLACEMENT  Patient Location: PACU  Anesthesia Type:MAC  Level of Consciousness: sedated  Airway & Oxygen Therapy: Patient Spontanous Breathing and Patient connected to face mask oxygen  Post-op Assessment: Report given to RN and Post -op Vital signs reviewed and stable  Post vital signs: Reviewed and stable  Last Vitals:  Vitals Value Taken Time  BP 114/40 07/15/22 1201  Temp    Pulse 61 07/15/22 1201  Resp 13 07/15/22 1201  SpO2 95 % 07/15/22 1201  Vitals shown include unvalidated device data.  Last Pain:  Vitals:   07/15/22 1201  TempSrc:   PainSc: Asleep         Complications: No notable events documented.

## 2022-07-15 NOTE — Discharge Instructions (Signed)
YOU HAD AN ENDOSCOPIC PROCEDURE TODAY: Refer to the procedure report and other information in the discharge instructions given to you for any specific questions about what was found during the examination. If this information does not answer your questions, please call [place] office at [phone] to clarify.   YOU SHOULD EXPECT: Some feelings of bloating in the abdomen. Passage of more gas than usual. Walking can help get rid of the air that was put into your GI tract during the procedure and reduce the bloating. If you had a lower endoscopy (such as a colonoscopy or flexible sigmoidoscopy) you may notice spotting of blood in your stool or on the toilet paper. Some abdominal soreness may be present for a day or two, also.  DIET: Your first meal following the procedure should be a light meal and then it is ok to progress to your normal diet. A half-sandwich or bowl of soup is an example of a good first meal. Heavy or fried foods are harder to digest and may make you feel nauseous or bloated. Drink plenty of fluids but you should avoid alcoholic beverages for 24 hours. If you had a esophageal dilation, please see attached instructions for diet.    ACTIVITY: Your care partner should take you home directly after the procedure. You should plan to take it easy, moving slowly for the rest of the day. You can resume normal activity the day after the procedure however YOU SHOULD NOT DRIVE, use power tools, machinery or perform tasks that involve climbing or major physical exertion for 24 hours (because of the sedation medicines used during the test).   SYMPTOMS TO REPORT IMMEDIATELY: A gastroenterologist can be reached at any hour. Please call [phone]  for any of the following symptoms:   Following upper endoscopy (EGD, EUS, ERCP, esophageal dilation) Vomiting of blood or coffee ground material  New, significant abdominal pain  New, significant chest pain or pain under the shoulder blades  Painful or  persistently difficult swallowing  New shortness of breath  Black, tarry-looking or red, bloody stools  FOLLOW UP:  If any biopsies were taken you will be contacted by phone or by letter within the next 1-3 weeks. Call [phone]  if you have not heard about the biopsies in 3 weeks.  Please also call with any specific questions about appointments or follow up tests.  

## 2022-07-19 ENCOUNTER — Inpatient Hospital Stay: Payer: Medicare Other | Attending: Hematology & Oncology

## 2022-07-19 ENCOUNTER — Inpatient Hospital Stay (HOSPITAL_BASED_OUTPATIENT_CLINIC_OR_DEPARTMENT_OTHER): Payer: Medicare Other | Admitting: Medical Oncology

## 2022-07-19 ENCOUNTER — Telehealth: Payer: Self-pay | Admitting: Gastroenterology

## 2022-07-19 ENCOUNTER — Encounter: Payer: Self-pay | Admitting: Medical Oncology

## 2022-07-19 ENCOUNTER — Other Ambulatory Visit: Payer: Self-pay

## 2022-07-19 VITALS — BP 127/53 | HR 63 | Temp 98.3°F | Resp 18 | Ht 65.0 in | Wt 162.4 lb

## 2022-07-19 DIAGNOSIS — K5900 Constipation, unspecified: Secondary | ICD-10-CM | POA: Insufficient documentation

## 2022-07-19 DIAGNOSIS — Z7901 Long term (current) use of anticoagulants: Secondary | ICD-10-CM

## 2022-07-19 DIAGNOSIS — Z86711 Personal history of pulmonary embolism: Secondary | ICD-10-CM | POA: Diagnosis not present

## 2022-07-19 DIAGNOSIS — I2601 Septic pulmonary embolism with acute cor pulmonale: Secondary | ICD-10-CM

## 2022-07-19 DIAGNOSIS — Z86718 Personal history of other venous thrombosis and embolism: Secondary | ICD-10-CM

## 2022-07-19 LAB — CBC WITH DIFFERENTIAL (CANCER CENTER ONLY)
Abs Immature Granulocytes: 0.01 10*3/uL (ref 0.00–0.07)
Basophils Absolute: 0 10*3/uL (ref 0.0–0.1)
Basophils Relative: 1 %
Eosinophils Absolute: 0.1 10*3/uL (ref 0.0–0.5)
Eosinophils Relative: 2 %
HCT: 42.6 % (ref 36.0–46.0)
Hemoglobin: 13.5 g/dL (ref 12.0–15.0)
Immature Granulocytes: 0 %
Lymphocytes Relative: 31 %
Lymphs Abs: 1.7 10*3/uL (ref 0.7–4.0)
MCH: 25.5 pg — ABNORMAL LOW (ref 26.0–34.0)
MCHC: 31.7 g/dL (ref 30.0–36.0)
MCV: 80.5 fL (ref 80.0–100.0)
Monocytes Absolute: 0.4 10*3/uL (ref 0.1–1.0)
Monocytes Relative: 7 %
Neutro Abs: 3.3 10*3/uL (ref 1.7–7.7)
Neutrophils Relative %: 59 %
Platelet Count: 185 10*3/uL (ref 150–400)
RBC: 5.29 MIL/uL — ABNORMAL HIGH (ref 3.87–5.11)
RDW: 13.3 % (ref 11.5–15.5)
WBC Count: 5.5 10*3/uL (ref 4.0–10.5)
nRBC: 0 % (ref 0.0–0.2)

## 2022-07-19 LAB — CMP (CANCER CENTER ONLY)
ALT: 14 U/L (ref 0–44)
AST: 16 U/L (ref 15–41)
Albumin: 4.3 g/dL (ref 3.5–5.0)
Alkaline Phosphatase: 63 U/L (ref 38–126)
Anion gap: 8 (ref 5–15)
BUN: 20 mg/dL (ref 8–23)
CO2: 28 mmol/L (ref 22–32)
Calcium: 10.1 mg/dL (ref 8.9–10.3)
Chloride: 106 mmol/L (ref 98–111)
Creatinine: 1.07 mg/dL — ABNORMAL HIGH (ref 0.44–1.00)
GFR, Estimated: 55 mL/min — ABNORMAL LOW (ref >60)
Glucose, Bld: 156 mg/dL — ABNORMAL HIGH (ref 70–99)
Potassium: 5.2 mmol/L — ABNORMAL HIGH (ref 3.5–5.1)
Sodium: 142 mmol/L (ref 135–145)
Total Bilirubin: 0.4 mg/dL (ref 0.3–1.2)
Total Protein: 6.3 g/dL — ABNORMAL LOW (ref 6.5–8.1)

## 2022-07-19 NOTE — Progress Notes (Signed)
Hematology and Oncology Follow Up Visit  Monica Kane:[id number] 08-27-1945 77 y.o. 07/19/2022   Principle Diagnosis:  Pulmonary embolism-bilateral-idiopathic  Current Therapy:   Xarelto 20 mg p.o. daily-started on 05/24/2021     Interim History:  Monica Kane is back for follow-up. The last time we saw her was in November about 6 months ago. She is still currently on Xarelto.  She is tolerating this well without any bleeding episodes or side effects.   At her last visit she was prescribed Creon for suspected pancreatic insufficiency and secondary diarrhea. She reports that she did not take that medication as she instead tried omeprazole which resolved her symptoms. She also has been seen by GI. Of note she had an endoscopy last Thursday for significant esophagitis. She will be monitored every 6 months for her pancreatic cysts. She also needed to have more tissue taken from a biopsy site of the duodenum area for pre-cancer. She will have an endoscopy yearly to keep an eye on this. She reports that she is feeling well but has not had a bowel movement since. No fever but has noticed some abdominal bloating and has not passed much, if any gas. She reports stomach pain as of this morning which have resolved.   She has had no fever.  She has had no bleeding.  There has been no urinary difficulties.  She has had no chest pain.  There has been no cough or shortness of breath.  Overall, I would say performance status is probably ECOG 1.    Wt Readings from Last 3 Encounters:  07/19/22 162 lb 6.4 oz (73.7 kg)  07/15/22 160 lb (72.6 kg)  05/13/22 164 lb (74.4 kg)     Medications:  Current Outpatient Medications:    alendronate (FOSAMAX) 70 MG tablet, Take 1 tablet (70 mg total) by mouth every 7 (seven) days. Take with a full glass of water on an empty stomach., Disp: 12 tablet, Rfl: 3   amitriptyline (ELAVIL) 25 MG tablet, TAKE ONE TABLET BY MOUTH AT BEDTIME, Disp: 90 tablet, Rfl: 1   carvedilol  (COREG) 3.125 MG tablet, TAKE ONE TABLET BY MOUTH TWICE DAILY WITH A MEAL (Patient taking differently: Take 3.125 mg by mouth at bedtime.), Disp: 180 tablet, Rfl: 1   lisinopril (ZESTRIL) 40 MG tablet, TAKE ONE TABLET BY MOUTH DAILY, Disp: 90 tablet, Rfl: 3   metFORMIN (GLUCOPHAGE) 500 MG tablet, TAKE ONE TABLET BY MOUTH DAILY. MAY START WITH 1/2 TAB AND INCREASE AS TOLERATED (Patient taking differently: Take 500 mg by mouth daily with breakfast.), Disp: 90 tablet, Rfl: 1   omeprazole (PRILOSEC) 20 MG capsule, Take 1 capsule (20 mg total) by mouth 2 (two) times daily before a meal. Twice daily for 1 month then may go back to once daily., Disp: 60 capsule, Rfl: 6   rivaroxaban (XARELTO) 20 MG TABS tablet, TAKE ONE TABLET BY MOUTH DAILY with SUPPER, Disp: 90 tablet, Rfl: 1   rosuvastatin (CRESTOR) 10 MG tablet, Take 1 tablet (10 mg total) by mouth at bedtime., Disp: 90 tablet, Rfl: 1   sertraline (ZOLOFT) 100 MG tablet, TAKE 2 & 1/2 TABLETS DAILY., Disp: 225 tablet, Rfl: 3   traZODone (DESYREL) 50 MG tablet, TAKE 1 TO 2 TABLETS BY MOUTH AT BEDTIME (Patient taking differently: Take 100 mg by mouth at bedtime.), Disp: 180 tablet, Rfl: 1  Allergies:  Allergies  Allergen Reactions   Penicillins Swelling and Rash    Facial swelling    Past Medical History,  Surgical history, Social history, and Family History were reviewed and updated.  Review of Systems: Review of Systems  Constitutional: Negative.   HENT:  Negative.    Eyes: Negative.   Respiratory: Negative.    Cardiovascular: Negative.   Gastrointestinal: Negative.   Endocrine: Negative.   Genitourinary: Negative.    Musculoskeletal: Negative.   Skin: Negative.   Neurological: Negative.   Hematological: Negative.   Psychiatric/Behavioral: Negative.      Physical Exam:  height is 5\' 5"  (1.651 m) and weight is 162 lb 6.4 oz (73.7 kg). Her oral temperature is 98.3 F (36.8 C). Her blood pressure is 127/53 (abnormal) and her pulse is 63.  Her respiration is 18 and oxygen saturation is 95%.   Wt Readings from Last 3 Encounters:  07/19/22 162 lb 6.4 oz (73.7 kg)  07/15/22 160 lb (72.6 kg)  05/13/22 164 lb (74.4 kg)    Physical Exam Vitals reviewed.  HENT:     Head: Normocephalic and atraumatic.  Eyes:     Pupils: Pupils are equal, round, and reactive to light.  Cardiovascular:     Rate and Rhythm: Normal rate and regular rhythm.     Heart sounds: Normal heart sounds.  Pulmonary:     Effort: Pulmonary effort is normal.     Breath sounds: Normal breath sounds.  Abdominal:     General: There is distension.     Palpations: There is no mass.     Tenderness: There is no abdominal tenderness. There is no guarding.     Comments: Semi soft. No abdominal sounds auscultated   Musculoskeletal:        General: No tenderness or deformity. Normal range of motion.     Cervical back: Normal range of motion.  Lymphadenopathy:     Cervical: No cervical adenopathy.  Skin:    General: Skin is warm and dry.     Findings: No erythema or rash.  Neurological:     Mental Status: She is alert and oriented to person, place, and time.  Psychiatric:        Behavior: Behavior normal.        Thought Content: Thought content normal.        Judgment: Judgment normal.      Lab Results  Component Value Date   WBC 5.5 07/19/2022   HGB 13.5 07/19/2022   HCT 42.6 07/19/2022   MCV 80.5 07/19/2022   PLT 185 07/19/2022     Chemistry      Component Value Date/Time   NA 143 03/08/2022 0756   K 4.3 03/08/2022 0756   CL 108 03/08/2022 0756   CO2 27 03/08/2022 0756   BUN 14 03/08/2022 0756   CREATININE 1.05 (H) 03/08/2022 0756   CREATININE 0.81 08/12/2016 1348      Component Value Date/Time   CALCIUM 9.6 03/08/2022 0756   ALKPHOS 60 03/08/2022 0756   AST 15 03/08/2022 0756   ALT 12 03/08/2022 0756   BILITOT 0.5 03/08/2022 0756      Impression and Plan: Monica Kane is a very nice 77 year old white female.  She had bilateral  pulmonary emboli.  This is idiopathic from all of her studies to date.  Thankfully, she did not have a high clot burden. Per Dr. Elnoria Howard she would benefit from anticoagulation for 2 years.  After that, he would then put her on maintenance anticoagulation.  In terms of her constipation I am concerned for peristalsis vs other from the recent anesthesia/endoscopy. I have discussed  my concerns with patient. She will contact the office that performed the procedure right after she leaves our office but likely will need ER evaluation which we discussed. The good signs are that she is not febrile without nausea, vomiting or current pain.   We will now plan to see her back in 6 months for monitoring of her anticoagulation medication.   Hughie Closs, PA-C 4/1/20249:39 AM

## 2022-07-19 NOTE — Telephone Encounter (Signed)
I spoke with the pt and she states that she has had a BM and has no current complaints.  She will call back with any further concerns.

## 2022-07-19 NOTE — Telephone Encounter (Signed)
Patient is calling states her hematologist is concerned that she has not had a BM since before her procedure and said she should speak with a nurse. Please advise

## 2022-07-20 LAB — SURGICAL PATHOLOGY

## 2022-07-23 ENCOUNTER — Encounter: Payer: Self-pay | Admitting: Gastroenterology

## 2022-07-26 DIAGNOSIS — H5213 Myopia, bilateral: Secondary | ICD-10-CM | POA: Diagnosis not present

## 2022-07-26 DIAGNOSIS — Z961 Presence of intraocular lens: Secondary | ICD-10-CM | POA: Diagnosis not present

## 2022-07-26 DIAGNOSIS — E119 Type 2 diabetes mellitus without complications: Secondary | ICD-10-CM | POA: Diagnosis not present

## 2022-07-26 LAB — HM DIABETES EYE EXAM

## 2022-07-31 ENCOUNTER — Other Ambulatory Visit: Payer: Medicare Other

## 2022-08-17 DIAGNOSIS — H01001 Unspecified blepharitis right upper eyelid: Secondary | ICD-10-CM | POA: Diagnosis not present

## 2022-08-17 DIAGNOSIS — H01111 Allergic dermatitis of right upper eyelid: Secondary | ICD-10-CM | POA: Diagnosis not present

## 2022-08-17 DIAGNOSIS — H01004 Unspecified blepharitis left upper eyelid: Secondary | ICD-10-CM | POA: Diagnosis not present

## 2022-08-31 ENCOUNTER — Other Ambulatory Visit: Payer: Self-pay | Admitting: Family Medicine

## 2022-08-31 DIAGNOSIS — F321 Major depressive disorder, single episode, moderate: Secondary | ICD-10-CM

## 2022-09-22 ENCOUNTER — Telehealth: Payer: Self-pay | Admitting: Family Medicine

## 2022-09-22 NOTE — Telephone Encounter (Signed)
Copied from CRM [id number]. Topic: Medicare AWV >> Sep 22, 2022  9:57 AM Payton Doughty wrote: Reason for CRM: LM 09/22/2022 to schedule AWV   Verlee Rossetti; Care Guide Ambulatory Clinical Support [place] [facility] Direct Dial: [phone]

## 2022-09-28 ENCOUNTER — Other Ambulatory Visit (HOSPITAL_COMMUNITY): Payer: Self-pay | Admitting: Surgery

## 2022-09-28 ENCOUNTER — Telehealth: Payer: Self-pay | Admitting: Gastroenterology

## 2022-09-28 DIAGNOSIS — D490 Neoplasm of unspecified behavior of digestive system: Secondary | ICD-10-CM

## 2022-09-28 NOTE — Telephone Encounter (Signed)
Patient called states she has been having a lot of abdominal pain and bloating, wondering if she should up her dose of Omeprazole. Please advise.

## 2022-09-29 NOTE — Telephone Encounter (Signed)
The pt decreased her omeprazole to once daily from twice a day and her abd pain and bloating has worsened.  She has an area on her abd near her belly button that is very sore and painful.  She has an appt with her PCP next week for eval of that area.  She will increase her PPI to BID and an appt has been made to f/u with Dr Meridee Score on 8/28 (first available) she will reach back out to our office if her PCP has concerns or if she has any new symptoms or worse symptoms.

## 2022-09-29 NOTE — Telephone Encounter (Signed)
Left message on machine to call back  

## 2022-10-03 ENCOUNTER — Ambulatory Visit (HOSPITAL_COMMUNITY)
Admission: RE | Admit: 2022-10-03 | Discharge: 2022-10-03 | Disposition: A | Discharge status: Home / Self Care | Payer: Medicare Other | Source: Ambulatory Visit | Attending: Surgery | Admitting: Surgery

## 2022-10-03 ENCOUNTER — Other Ambulatory Visit (HOSPITAL_COMMUNITY): Payer: Self-pay | Admitting: Surgery

## 2022-10-03 DIAGNOSIS — D490 Neoplasm of unspecified behavior of digestive system: Secondary | ICD-10-CM | POA: Insufficient documentation

## 2022-10-03 DIAGNOSIS — K8689 Other specified diseases of pancreas: Secondary | ICD-10-CM | POA: Diagnosis not present

## 2022-10-03 DIAGNOSIS — R935 Abnormal findings on diagnostic imaging of other abdominal regions, including retroperitoneum: Secondary | ICD-10-CM | POA: Diagnosis not present

## 2022-10-03 MED ORDER — GADOBUTROL 1 MMOL/ML IV SOLN
7.0000 mL | Freq: Once | INTRAVENOUS | Status: AC | PRN
  Administered 2022-10-03: 7 mL via INTRAVENOUS

## 2022-10-05 ENCOUNTER — Other Ambulatory Visit: Payer: Self-pay | Admitting: Family Medicine

## 2022-10-05 DIAGNOSIS — G609 Hereditary and idiopathic neuropathy, unspecified: Secondary | ICD-10-CM

## 2022-10-06 ENCOUNTER — Ambulatory Visit: Payer: Medicare Other | Admitting: Family Medicine

## 2022-10-07 ENCOUNTER — Other Ambulatory Visit (HOSPITAL_COMMUNITY): Payer: Self-pay | Admitting: Surgery

## 2022-10-07 DIAGNOSIS — D490 Neoplasm of unspecified behavior of digestive system: Secondary | ICD-10-CM

## 2022-10-13 ENCOUNTER — Ambulatory Visit (HOSPITAL_COMMUNITY): Admission: RE | Admit: 2022-10-13 | Payer: Medicare Other | Source: Ambulatory Visit

## 2022-10-15 DIAGNOSIS — D490 Neoplasm of unspecified behavior of digestive system: Secondary | ICD-10-CM | POA: Diagnosis not present

## 2022-10-16 NOTE — Progress Notes (Unsigned)
[facility] [address] [place], [place] [address] [phone]  Date:  10/18/2022   Name:  Monica Kane   DOB:  13-Jul-1945   MRN:  [id number]  PCP:  Monica Cables, MD    Chief Complaint: No chief complaint on file.   History of Present Illness:  Monica Kane is a 77 y.o. very pleasant female patient who presents with the following:  Pt seen today for a recheck Last seen by myself 9/23 History of hypertension, diabetes, hyperlipidemia, sleep apnea treated with oral appliance, vitamin D deficiency In February 2023 she also tested positive for pulmonary embolism in the setting of acute illness, suspected COVID though she tested negative.  Plan to treat with Xarelto for 1 year  Seen by hematology in April-  Monica Kane is a very nice 77 year old white female.  She had bilateral pulmonary emboli.  This is idiopathic from all of her studies to date.  Thankfully, she did not have a high clot burden. Per Dr. Rexene Edison she would benefit from anticoagulation for 2 years.  After that, he would then put her on maintenance anticoagulation   We have been following an issue with a pancreatic lesion- most recent MRI last month: IMPRESSION: Two stable pancreatic cystic lesions measuring 19 mm in the pancreatic tail, favoring side branch IPMNs. Follow-up MRI abdomen with/without contrast is suggested in 1 year.  Urine micro Shingrix Foot exam is due  Lab Results  Component Value Date   HGBA1C 7.2 (H) 05/04/2021     Patient Active Problem List   Diagnosis Date Noted   Pancreatic cyst 03/31/2022   Elevated CA 19-9 level 03/31/2022   Family history of malignant neoplasm of pancreas 03/31/2022   Hx of adenomatous colonic polyps 03/31/2022   Chronic anticoagulation 03/31/2022   Pulmonary embolism (HCC) 07/08/2021   Acquired claw toe, right    Achilles tendon contracture, right    OSA (obstructive sleep apnea)  07/26/2019   Vertigo 10/03/2018   Visual blurriness 10/03/2018   Controlled type 2 diabetes mellitus without complication, without long-term current use of insulin (HCC) 06/05/2015   Primary localized osteoarthritis of right knee    Anxiety    Chest pain, midsternal 07/23/2013   Osteopenia 03/23/2013   Kidney stones, calcium oxalate 03/23/2013   Hypertension 03/31/2011   Hyperlipidemia 03/31/2011   Vitamin D deficiency 03/31/2011   Hallux rigidus 03/24/2011   Metatarsalgia of left foot 03/24/2011   Lateral epicondylitis of left elbow 02/24/2011   Migraine 02/24/2011   Depression 02/24/2011   Arthritis 02/24/2011    Past Medical History:  Diagnosis Date   Anxiety    takes Ativan nightly   Depression    takes Zoloft daily   History of colon polyps    History of kidney stones    History of staph infection 11/2013   Hyperlipidemia    takes Crestor nightly    Hypertension    takes Lisinopril and Coreg daily   Joint pain    Joint swelling    Neuropathy    takes Elavil nightly   Pneumonia    Pre-diabetes    Primary localized osteoarthritis of right knee    Pulmonary embolism (HCC) 07/08/2021   Sleep apnea    uses oral appliance    Past Surgical History:  Procedure Laterality Date   ABDOMINAL HYSTERECTOMY  04/20/1987   APPENDECTOMY     BIOPSY  05/13/2022   Procedure: BIOPSY;  Surgeon: Lemar Lofty., MD;  Location: [facility];  Service: Gastroenterology;;   BREAST BIOPSY Left 02/16/2016   BREAST EXCISIONAL BIOPSY Right 2008   CARDIAC CATHETERIZATION  04/19/2013   CERVICAL SPINE SURGERY  04/19/2002   C 5-6   COLONOSCOPY     colonscopy     DIAGNOSTIC LAPAROSCOPY  04/19/1974   fibroid removed   ELBOW ARTHROSCOPY  2010, 2012   ESOPHAGOGASTRODUODENOSCOPY (EGD) WITH PROPOFOL N/A 05/13/2022   Procedure: ESOPHAGOGASTRODUODENOSCOPY (EGD) WITH PROPOFOL;  Surgeon: Lemar Lofty., MD;  Location: [facility];  Service: Gastroenterology;  Laterality: N/A;    ESOPHAGOGASTRODUODENOSCOPY (EGD) WITH PROPOFOL N/A 07/15/2022   Procedure: ESOPHAGOGASTRODUODENOSCOPY (EGD) WITH PROPOFOL;  Surgeon: Meridee Score Netty Starring., MD;  Location: [facility];  Service: Gastroenterology;  Laterality: N/A;   EUS N/A 07/15/2022   Procedure: UPPER ENDOSCOPIC ULTRASOUND (EUS) RADIAL;  Surgeon: Lemar Lofty., MD;  Location: [facility];  Service: Gastroenterology;  Laterality: N/A;   EYE SURGERY Bilateral    cataract   FINE NEEDLE ASPIRATION N/A 05/13/2022   Procedure: FINE NEEDLE ASPIRATION (FNA) LINEAR;  Surgeon: Lemar Lofty., MD;  Location: [facility];  Service: Gastroenterology;  Laterality: N/A;   FOOT SURGERY Left 04/20/2011   GASTROCNEMIUS RECESSION Right 10/09/2019   Procedure: RIGHT GASTROCNEMIUS RECESSION;  Surgeon: Nadara Mustard, MD;  Location: [facility];  Service: Orthopedics;  Laterality: Right;   HEMOSTASIS CLIP PLACEMENT  07/15/2022   Procedure: HEMOSTASIS CLIP PLACEMENT;  Surgeon: Lemar Lofty., MD;  Location: [facility];  Service: Gastroenterology;;   Lorin Mercy  1989   KNEE ARTHROSCOPY  01/17/2013   KNEE ARTHROSCOPY W/ MENISCECTOMY Right 01/31/2013   [facility]   LATERAL EPICONDYLE RELEASE  03/01/2011   Procedure: TENNIS ELBOW RELEASE;  Surgeon: Nilda Simmer, MD;  Location: [facility];  Service: Orthopedics;  Laterality: Left;  TENOTOMY ELBOW LATERAL EPICONDYLITIS TENNIS ELBOW   lateral release right elbow  04/19/2008   left foot surgery  04/20/2011   mortons neuroma   LEFT HEART CATHETERIZATION WITH CORONARY ANGIOGRAM N/A 07/23/2013   Procedure: LEFT HEART CATHETERIZATION WITH CORONARY ANGIOGRAM;  Surgeon: Lesleigh Noe, MD;  Location: [facility];  Service: Cardiovascular;  Laterality: N/A;   PAROTID GLAND TUMOR EXCISION  04/19/1974   benign   PAROTID GLAND TUMOR EXCISION  04/19/1974   rt   SHOULDER ARTHROSCOPY DISTAL CLAVICLE EXCISION AND OPEN ROTATOR CUFF REPAIR  04/19/2004    SHOULDER ARTHROSCOPY W/ ROTATOR CUFF REPAIR  04/19/2004   SUBMUCOSAL LIFTING INJECTION  07/15/2022   Procedure: SUBMUCOSAL LIFTING INJECTION;  Surgeon: Lemar Lofty., MD;  Location: [facility];  Service: Gastroenterology;;   TOTAL KNEE ARTHROPLASTY Right 02/18/2014   Procedure: TOTAL KNEE ARTHROPLASTY;  Surgeon: Nilda Simmer, MD;  Location: [facility];  Service: Orthopedics;  Laterality: Right;   UPPER ESOPHAGEAL ENDOSCOPIC ULTRASOUND (EUS) N/A 05/13/2022   Procedure: UPPER ESOPHAGEAL ENDOSCOPIC ULTRASOUND (EUS);  Surgeon: Lemar Lofty., MD;  Location: [facility];  Service: Gastroenterology;  Laterality: N/A;   WEIL OSTEOTOMY Right 10/09/2019   Procedure: WEIL OSTEOTOMY RIGHT 2ND METATARSAL;  Surgeon: Nadara Mustard, MD;  Location: [facility];  Service: Orthopedics;  Laterality: Right;    Social History   Tobacco Use   Smoking status: Never   Smokeless tobacco: Never  Vaping Use   Vaping Use: Never used  Substance Use Topics   Alcohol use: Yes    Comment: social   Drug use: No    Family History  Problem  Relation Age of Onset   Cancer Mother        lung   Cancer Father        lung; + tobacco   Diabetes Sister    Hypertension Sister    Hyperlipidemia Sister    Kidney failure Sister    Pancreatic cancer Maternal Aunt    Heart disease Maternal Grandfather        heart attack   Heart disease Paternal Grandfather        heart attack   Breast cancer Neg Hx    Colon cancer Neg Hx    Esophageal cancer Neg Hx    Inflammatory bowel disease Neg Hx    Liver disease Neg Hx    Rectal cancer Neg Hx    Stomach cancer Neg Hx     Allergies  Allergen Reactions   Penicillins Swelling and Rash    Facial swelling    Medication list has been reviewed and updated.  Current Outpatient Medications on File Prior to Visit  Medication Sig Dispense Refill   alendronate (FOSAMAX) 70 MG tablet Take 1 tablet (70 mg total) by mouth every 7 (seven) days. Take  with a full glass of water on an empty stomach. 12 tablet 3   amitriptyline (ELAVIL) 25 MG tablet Take 1 tablet (25 mg total) by mouth at bedtime. 30 tablet 0   carvedilol (COREG) 3.125 MG tablet TAKE ONE TABLET BY MOUTH TWICE DAILY WITH A MEAL (Patient taking differently: Take 3.125 mg by mouth at bedtime.) 180 tablet 1   lisinopril (ZESTRIL) 40 MG tablet TAKE ONE TABLET BY MOUTH DAILY 90 tablet 3   metFORMIN (GLUCOPHAGE) 500 MG tablet TAKE ONE TABLET BY MOUTH DAILY. MAY START WITH 1/2 TAB AND INCREASE AS TOLERATED (Patient taking differently: Take 500 mg by mouth daily with breakfast.) 90 tablet 1   omeprazole (PRILOSEC) 20 MG capsule Take 1 capsule (20 mg total) by mouth 2 (two) times daily before a meal. Twice daily for 1 month then may go back to once daily. 60 capsule 6   rivaroxaban (XARELTO) 20 MG TABS tablet TAKE ONE TABLET BY MOUTH DAILY with SUPPER 90 tablet 1   rosuvastatin (CRESTOR) 10 MG tablet Take 1 tablet (10 mg total) by mouth at bedtime. 90 tablet 1   sertraline (ZOLOFT) 100 MG tablet TAKE 2 & 1/2 TABLETS DAILY. 225 tablet 3   traZODone (DESYREL) 50 MG tablet TAKE 1 TO 2 TABLETS BY MOUTH AT BEDTIME (Patient taking differently: Take 100 mg by mouth at bedtime.) 180 tablet 1   No current facility-administered medications on file prior to visit.    Review of Systems:  As per HPI- otherwise negative.   Physical Examination: There were no vitals filed for this visit. There were no vitals filed for this visit. There is no height or weight on file to calculate BMI. Ideal Body Weight:    GEN: no acute distress. HEENT: Atraumatic, Normocephalic.  Ears and Nose: No external deformity. CV: RRR, No M/G/R. No JVD. No thrill. No extra heart sounds. PULM: CTA B, no wheezes, crackles, rhonchi. No retractions. No resp. distress. No accessory muscle use. ABD: S, NT, ND, +BS. No rebound. No HSM. EXTR: No c/c/e PSYCH: Normally interactive. Conversant.    Assessment and  Plan: ***  Signed Abbe Amsterdam, MD

## 2022-10-18 ENCOUNTER — Encounter: Payer: Self-pay | Admitting: Family Medicine

## 2022-10-18 ENCOUNTER — Ambulatory Visit (INDEPENDENT_AMBULATORY_CARE_PROVIDER_SITE_OTHER): Payer: Medicare Other | Admitting: Family Medicine

## 2022-10-18 VITALS — BP 126/70 | HR 62 | Temp 97.9°F | Resp 18 | Ht 65.0 in | Wt 158.6 lb

## 2022-10-18 DIAGNOSIS — G609 Hereditary and idiopathic neuropathy, unspecified: Secondary | ICD-10-CM

## 2022-10-18 DIAGNOSIS — Q453 Other congenital malformations of pancreas and pancreatic duct: Secondary | ICD-10-CM | POA: Diagnosis not present

## 2022-10-18 DIAGNOSIS — Z7984 Long term (current) use of oral hypoglycemic drugs: Secondary | ICD-10-CM

## 2022-10-18 DIAGNOSIS — E119 Type 2 diabetes mellitus without complications: Secondary | ICD-10-CM | POA: Diagnosis not present

## 2022-10-18 DIAGNOSIS — I10 Essential (primary) hypertension: Secondary | ICD-10-CM | POA: Diagnosis not present

## 2022-10-18 DIAGNOSIS — M8580 Other specified disorders of bone density and structure, unspecified site: Secondary | ICD-10-CM | POA: Diagnosis not present

## 2022-10-18 LAB — MICROALBUMIN / CREATININE URINE RATIO
Creatinine,U: 240.7 mg/dL
Microalb Creat Ratio: 2.7 mg/g (ref 0.0–30.0)
Microalb, Ur: 6.4 mg/dL — ABNORMAL HIGH (ref 0.0–1.9)

## 2022-10-18 LAB — HEMOGLOBIN A1C: Hgb A1c MFr Bld: 6.6 % — ABNORMAL HIGH (ref 4.6–6.5)

## 2022-10-18 MED ORDER — ALENDRONATE SODIUM 70 MG PO TABS: 70.0000 mg | ORAL_TABLET | ORAL | 3 refills | Status: AC

## 2022-10-18 MED ORDER — AMITRIPTYLINE HCL 25 MG PO TABS: 25.0000 mg | ORAL_TABLET | Freq: Every day | ORAL | 3 refills | Status: DC

## 2022-10-18 NOTE — Patient Instructions (Addendum)
It was great to see you- I will be in touch with labs  Recommend getting the shingles vaccine at your pharmacy if not done already  Assuming all is well, lets check back in about 6 months

## 2022-10-19 DIAGNOSIS — D490 Neoplasm of unspecified behavior of digestive system: Secondary | ICD-10-CM | POA: Diagnosis not present

## 2022-10-20 ENCOUNTER — Ambulatory Visit (INDEPENDENT_AMBULATORY_CARE_PROVIDER_SITE_OTHER): Payer: Medicare Other | Admitting: *Deleted

## 2022-10-20 DIAGNOSIS — Z0000 Encounter for general adult medical examination without abnormal findings: Secondary | ICD-10-CM | POA: Diagnosis not present

## 2022-10-20 NOTE — Progress Notes (Signed)
Subjective:   Monica Kane is a 77 y.o. female who presents for Medicare Annual (Subsequent) preventive examination.  Visit Complete: Virtual  I connected with  [name] on 10/20/22 by a audio enabled telemedicine application and verified that I am speaking with the correct person using two identifiers.  Patient Location: Home  Provider Location: Office/Clinic  I discussed the limitations of evaluation and management by telemedicine. The patient expressed understanding and agreed to proceed.  Patient Medicare AWV questionnaire was completed by the patient on 10/13/22; I have confirmed that all information answered by patient is correct and no changes since this date. PHQ9 was completed in office with PCP on 10/18/22.  These answers verified as well.  Review of Systems     Cardiac Risk Factors include: advanced age (>93men, >47 women);diabetes mellitus;dyslipidemia;hypertension     Objective:    Today's Vitals   There is no height or weight on file to calculate BMI.     10/20/2022    9:02 AM 07/19/2022    9:22 AM 07/15/2022   10:18 AM 05/13/2022    6:43 AM 03/08/2022    8:13 AM 10/27/2021   10:36 AM 09/28/2021   10:03 AM  Advanced Directives  Does Patient Have a Medical Advance Directive? Yes Yes Yes Yes Yes Yes Yes  Type of Estate agent of [place];Living will Healthcare Power of [place];Living will Healthcare Power of [place];Living will Living will;Healthcare Power of [employer] Power of [place];Living will Healthcare Power of [place];Living will  Does patient want to make changes to medical advance directive?  No - Patient declined    No - Patient declined   Copy of Healthcare Power of Attorney in Chart? No - copy requested No - copy requested No - copy requested No - copy requested  No - copy requested No - copy requested  Would patient like information on creating a medical advance directive?      No - Patient declined     Current  Medications (verified) Outpatient Encounter Medications as of 10/20/2022  Medication Sig   alendronate (FOSAMAX) 70 MG tablet Take 1 tablet (70 mg total) by mouth every 7 (seven) days. Take with a full glass of water on an empty stomach.   amitriptyline (ELAVIL) 25 MG tablet Take 1 tablet (25 mg total) by mouth at bedtime.   carvedilol (COREG) 3.125 MG tablet TAKE ONE TABLET BY MOUTH TWICE DAILY WITH A MEAL (Patient taking differently: Take 3.125 mg by mouth at bedtime.)   lisinopril (ZESTRIL) 40 MG tablet TAKE ONE TABLET BY MOUTH DAILY   metFORMIN (GLUCOPHAGE) 500 MG tablet TAKE ONE TABLET BY MOUTH DAILY. MAY START WITH 1/2 TAB AND INCREASE AS TOLERATED (Patient taking differently: Take 500 mg by mouth daily with breakfast.)   omeprazole (PRILOSEC) 20 MG capsule Take 1 capsule (20 mg total) by mouth 2 (two) times daily before a meal. Twice daily for 1 month then may go back to once daily.   rivaroxaban (XARELTO) 20 MG TABS tablet TAKE ONE TABLET BY MOUTH DAILY with SUPPER   rosuvastatin (CRESTOR) 10 MG tablet Take 1 tablet (10 mg total) by mouth at bedtime.   sertraline (ZOLOFT) 100 MG tablet TAKE 2 & 1/2 TABLETS DAILY.   traZODone (DESYREL) 50 MG tablet TAKE 1 TO 2 TABLETS BY MOUTH AT BEDTIME (Patient taking differently: Take 100 mg by mouth at bedtime.)   No facility-administered encounter medications on file as of 10/20/2022.    Allergies (verified) Penicillins  History: Past Medical History:  Diagnosis Date   Allergy    Rash, some swelling on face   Anxiety    takes Ativan nightly   Depression    takes Zoloft daily   GERD (gastroesophageal reflux disease) March 2024   History of colon polyps    History of kidney stones    History of staph infection 11/2013   Hyperlipidemia    takes Crestor nightly    Hypertension    takes Lisinopril and Coreg daily   Joint pain    Joint swelling    Neuropathy    takes Elavil nightly   Pneumonia    Pre-diabetes    Primary localized  osteoarthritis of right knee    Pulmonary embolism (HCC) 07/08/2021   Sleep apnea    uses oral appliance   Past Surgical History:  Procedure Laterality Date   ABDOMINAL HYSTERECTOMY  04/20/1987   APPENDECTOMY     BIOPSY  05/13/2022   Procedure: BIOPSY;  Surgeon: Lemar Lofty., MD;  Location: [facility];  Service: Gastroenterology;;   BREAST BIOPSY Left 02/16/2016   BREAST EXCISIONAL BIOPSY Right 2008   CARDIAC CATHETERIZATION  04/19/2013   CERVICAL SPINE SURGERY  04/19/2002   C 5-6   COLONOSCOPY     colonscopy     DIAGNOSTIC LAPAROSCOPY  04/19/1974   fibroid removed   ELBOW ARTHROSCOPY  2010, 2012   ESOPHAGOGASTRODUODENOSCOPY (EGD) WITH PROPOFOL N/A 05/13/2022   Procedure: ESOPHAGOGASTRODUODENOSCOPY (EGD) WITH PROPOFOL;  Surgeon: Lemar Lofty., MD;  Location: [facility];  Service: Gastroenterology;  Laterality: N/A;   ESOPHAGOGASTRODUODENOSCOPY (EGD) WITH PROPOFOL N/A 07/15/2022   Procedure: ESOPHAGOGASTRODUODENOSCOPY (EGD) WITH PROPOFOL;  Surgeon: Meridee Score Netty Starring., MD;  Location: [facility];  Service: Gastroenterology;  Laterality: N/A;   EUS N/A 07/15/2022   Procedure: UPPER ENDOSCOPIC ULTRASOUND (EUS) RADIAL;  Surgeon: Lemar Lofty., MD;  Location: [facility];  Service: Gastroenterology;  Laterality: N/A;   EYE SURGERY Bilateral    cataract   FINE NEEDLE ASPIRATION N/A 05/13/2022   Procedure: FINE NEEDLE ASPIRATION (FNA) LINEAR;  Surgeon: Lemar Lofty., MD;  Location: [facility];  Service: Gastroenterology;  Laterality: N/A;   FOOT SURGERY Left 04/20/2011   GASTROCNEMIUS RECESSION Right 10/09/2019   Procedure: RIGHT GASTROCNEMIUS RECESSION;  Surgeon: Nadara Mustard, MD;  Location: [facility];  Service: Orthopedics;  Laterality: Right;   HEMOSTASIS CLIP PLACEMENT  07/15/2022   Procedure: HEMOSTASIS CLIP PLACEMENT;  Surgeon: Lemar Lofty., MD;  Location: [facility];  Service: Gastroenterology;;    HYSTEROTOMY  1989   JOINT REPLACEMENT  01/2014   Right knee   KNEE ARTHROSCOPY  01/17/2013   KNEE ARTHROSCOPY W/ MENISCECTOMY Right 01/31/2013   [place]   LATERAL EPICONDYLE RELEASE  03/01/2011   Procedure: TENNIS ELBOW RELEASE;  Surgeon: Nilda Simmer, MD;  Location: [facility];  Service: Orthopedics;  Laterality: Left;  TENOTOMY ELBOW LATERAL EPICONDYLITIS TENNIS ELBOW   lateral release right elbow  04/19/2008   left foot surgery  04/20/2011   mortons neuroma   LEFT HEART CATHETERIZATION WITH CORONARY ANGIOGRAM N/A 07/23/2013   Procedure: LEFT HEART CATHETERIZATION WITH CORONARY ANGIOGRAM;  Surgeon: Lesleigh Noe, MD;  Location: [facility];  Service: Cardiovascular;  Laterality: N/A;   PAROTID GLAND TUMOR EXCISION  04/19/1974   benign   PAROTID GLAND TUMOR EXCISION  04/19/1974   rt   SHOULDER ARTHROSCOPY DISTAL CLAVICLE EXCISION AND OPEN ROTATOR CUFF REPAIR  04/19/2004   SHOULDER ARTHROSCOPY W/ ROTATOR  CUFF REPAIR  04/19/2004   SUBMUCOSAL LIFTING INJECTION  07/15/2022   Procedure: SUBMUCOSAL LIFTING INJECTION;  Surgeon: Lemar Lofty., MD;  Location: [facility];  Service: Gastroenterology;;   TOTAL KNEE ARTHROPLASTY Right 02/18/2014   Procedure: TOTAL KNEE ARTHROPLASTY;  Surgeon: Nilda Simmer, MD;  Location: [facility];  Service: Orthopedics;  Laterality: Right;   UPPER ESOPHAGEAL ENDOSCOPIC ULTRASOUND (EUS) N/A 05/13/2022   Procedure: UPPER ESOPHAGEAL ENDOSCOPIC ULTRASOUND (EUS);  Surgeon: Lemar Lofty., MD;  Location: [facility];  Service: Gastroenterology;  Laterality: N/A;   WEIL OSTEOTOMY Right 10/09/2019   Procedure: WEIL OSTEOTOMY RIGHT 2ND METATARSAL;  Surgeon: Nadara Mustard, MD;  Location: [facility];  Service: Orthopedics;  Laterality: Right;   Family History  Problem Relation Age of Onset   Cancer Mother        lung   Diabetes Mother    Heart disease Mother    Hypertension Mother    Kidney disease Mother     Cancer Father        lung; + tobacco   Alcohol abuse Father    Diabetes Sister    Hypertension Sister    Hyperlipidemia Sister    Kidney failure Sister    Cancer Sister    Depression Sister    Pancreatic cancer Maternal Aunt    Heart disease Maternal Grandfather        heart attack   Heart disease Paternal Grandfather        heart attack   Breast cancer Neg Hx    Colon cancer Neg Hx    Esophageal cancer Neg Hx    Inflammatory bowel disease Neg Hx    Liver disease Neg Hx    Rectal cancer Neg Hx    Stomach cancer Neg Hx    Social History   Socioeconomic History   Marital status: Married    Spouse name: Adesola Duprey   Number of children: Not on file   Years of education: Not on file   Highest education level: Bachelor's degree (e.g., BA, AB, BS)  Occupational History   Not on file  Tobacco Use   Smoking status: Never   Smokeless tobacco: Never  Vaping Use   Vaping Use: Never used  Substance and Sexual Activity   Alcohol use: Yes    Comment: Occasionally on weekends   Drug use: Never   Sexual activity: Not Currently    Birth control/protection: Surgical  Other Topics Concern   Not on file  Social History Narrative   Lives with her husband.  He has 2 adult children. Patient has a Naval architect, and she does exercise.   Social Determinants of Health   Financial Resource Strain: Low Risk  (10/13/2022)   Overall Financial Resource Strain (CARDIA)    Difficulty of Paying Living Expenses: Not hard at all  Food Insecurity: No Food Insecurity (10/13/2022)   Hunger Vital Sign    Worried About Running Out of Food in the Last Year: Never true    Ran Out of Food in the Last Year: Never true  Transportation Needs: No Transportation Needs (10/13/2022)   PRAPARE - Administrator, Civil Service (Medical): No    Lack of Transportation (Non-Medical): No  Physical Activity: Sufficiently Active (10/13/2022)   Exercise Vital Sign    Days of Exercise per Week: 3 days     Minutes of Exercise per Session: 80 min  Stress: No Stress Concern Present (10/13/2022)   [employer] of Occupational Health -  Occupational Stress Questionnaire    Feeling of Stress : Only a little  Social Connections: Moderately Isolated (10/13/2022)   Social Connection and Isolation Panel [NHANES]    Frequency of Communication with Friends and Family: Once a week    Frequency of Social Gatherings with Friends and Family: Twice a week    Attends Religious Services: Never    Diplomatic Services operational officer: No    Attends Engineer, structural: Not on file    Marital Status: Married    Tobacco Counseling Counseling given: Not Answered   Clinical Intake:  Pre-visit preparation completed: Yes  Pain : No/denies pain  BMI - recorded: 26.39 Nutritional Status: BMI 25 -29 Overweight Nutritional Risks: None Diabetes: Yes CBG done?: No Did pt. bring in CBG monitor from home?: No  How often do you need to have someone help you when you read instructions, pamphlets, or other written materials from your doctor or pharmacy?: 2 - Rarely  Interpreter Needed?: No  Information entered by :: Donne Anon, CMA   Activities of Daily Living    10/13/2022   11:29 AM  In your present state of health, do you have any difficulty performing the following activities:  Hearing? 0  Vision? 0  Difficulty concentrating or making decisions? 1  Walking or climbing stairs? 0  Dressing or bathing? 0  Doing errands, shopping? 0  Preparing Food and eating ? N  Using the Toilet? N  In the past six months, have you accidently leaked urine? N  Do you have problems with loss of bowel control? N  Managing your Medications? N  Managing your Finances? N  Housekeeping or managing your Housekeeping? N    Patient Care Team: Copland, Gwenlyn Found, MD as PCP - General (Family Medicine) Shirlean Kelly, MD as Consulting Physician (Neurosurgery)  Indicate any recent Medical Services  you may have received from other than [facility] providers in the past year (date may be approximate).     Assessment:   This is a routine wellness examination for Eyleen.  Hearing/Vision screen No results found.  Dietary issues and exercise activities discussed:     Goals Addressed   None    Depression Screen    10/18/2022   10:42 AM 09/28/2021   10:07 AM 05/27/2021    1:01 PM 08/21/2020    8:52 AM 07/17/2018    3:33 PM 08/12/2016    1:18 PM 09/10/2015    2:39 PM  PHQ 2/9 Scores  PHQ - 2 Score 0 0 0 6 0 0 0  PHQ- 9 Score 8  0 12     Exception Documentation       Patient refusal    Fall Risk    10/18/2022   10:10 AM 10/13/2022   11:29 AM 09/28/2021   10:05 AM 05/04/2021    9:00 AM 02/25/2020    9:35 AM  Fall Risk   Falls in the past year? 0 0 1 0 0  Number falls in past yr: 0 0 0 0 0  Injury with Fall? 0 0 0 0 0  Risk for fall due to : No Fall Risks Other (Comment);No Fall Risks History of fall(s)    Follow up Falls evaluation completed Falls evaluation completed Falls prevention discussed      MEDICARE RISK AT HOME:   TIMED UP AND GO:  Was the test performed?  No    Cognitive Function:        10/20/2022    9:06  AM  6CIT Screen  What Year? 0 points  What month? 0 points  What time? 0 points  Count back from 20 0 points  Months in reverse 0 points  Repeat phrase 0 points  Total Score 0 points    Immunizations Immunization History  Administered Date(s) Administered   Fluad Quad(high Dose 65+) 02/28/2019   Influenza Split 01/15/2021   Influenza, High Dose Seasonal PF 01/04/2018   Influenza,inj,Quad PF,6+ Mos 01/02/2014, 01/22/2015   Influenza-Unspecified 03/24/2022   PFIZER Comirnaty(Gray Top)Covid-19 Tri-Sucrose Vaccine 08/21/2020   PFIZER(Purple Top)SARS-COV-2 Vaccination 05/25/2019, 06/19/2019, 01/15/2020   Pfizer Covid-19 Vaccine Bivalent Booster 28yrs & up 01/15/2021   Pneumococcal Conjugate-13 01/22/2015   Pneumococcal Polysaccharide-23 03/23/2013   Td  02/18/2008   Tdap 10/31/2018    TDAP status: Up to date  Flu Vaccine status: Up to date  Pneumococcal vaccine status: Up to date  Covid-19 vaccine status: Information provided on how to obtain vaccines.   Qualifies for Shingles Vaccine? Yes   Zostavax completed No   Shingrix Completed?: No.    Education has been provided regarding the importance of this vaccine. Patient has been advised to call insurance company to determine out of pocket expense if they have not yet received this vaccine. Advised may also receive vaccine at local pharmacy or Health Dept. Verbalized acceptance and understanding.  Screening Tests Health Maintenance  Topic Date Due   Zoster Vaccines- Shingrix (1 of 2) Never done   COVID-19 Vaccine (6 - 2023-24 season) 12/18/2021   Medicare Annual Wellness (AWV)  09/29/2022   INFLUENZA VACCINE  11/18/2022   HEMOGLOBIN A1C  04/20/2023   Diabetic kidney evaluation - eGFR measurement  07/19/2023   OPHTHALMOLOGY EXAM  07/26/2023   Diabetic kidney evaluation - Urine ACR  10/18/2023   FOOT EXAM  10/18/2023   MAMMOGRAM  04/15/2024   Colonoscopy  07/15/2025   DTaP/Tdap/Td (3 - Td or Tdap) 10/30/2028   Pneumonia Vaccine 73+ Years old  Completed   DEXA SCAN  Completed   Hepatitis C Screening  Completed   HPV VACCINES  Aged Out    Health Maintenance  Health Maintenance Due  Topic Date Due   Zoster Vaccines- Shingrix (1 of 2) Never done   COVID-19 Vaccine (6 - 2023-24 season) 12/18/2021   Medicare Annual Wellness (AWV)  09/29/2022    Colorectal cancer screening: Type of screening: Colonoscopy. Completed 07/15/20. Repeat every 5 years  Mammogram status: Completed 04/15/22. Repeat every year  Bone Density status: Completed 10/15/21. Results reflect: Bone density results: OSTEOPENIA. Repeat every 2 years.  Lung Cancer Screening: (Low Dose CT Chest recommended if Age 18-80 years, 20 pack-year currently smoking OR have quit w/in 15years.) does not qualify.    Additional Screening:  Hepatitis C Screening: does qualify; Completed 03/23/13  Vision Screening: Recommended annual ophthalmology exams for early detection of glaucoma and other disorders of the eye. Is the patient up to date with their annual eye exam?  Yes  Who is the provider or what is the name of the office in which the patient attends annual eye exams? Dr. U[facility] If pt is not established with a provider, would they like to be referred to a provider to establish care? No .   Dental Screening: Recommended annual dental exams for proper oral hygiene  Diabetic Foot Exam: Diabetic Foot Exam: Completed 10/18/22  Community Resource Referral / Chronic Care Management: CRR required this visit?  No   CCM required this visit?  No     Plan:  I have personally reviewed and noted the following in the patient's chart:   Medical and social history Use of alcohol, tobacco or illicit drugs  Current medications and supplements including opioid prescriptions. Patient is not currently taking opioid prescriptions. Functional ability and status Nutritional status Physical activity Advanced directives List of other physicians Hospitalizations, surgeries, and ER visits in previous 12 months Vitals Screenings to include cognitive, depression, and falls Referrals and appointments  In addition, I have reviewed and discussed with patient certain preventive protocols, quality metrics, and best practice recommendations. A written personalized care plan for preventive services as well as general preventive health recommendations were provided to patient.     Donne Anon, CMA   10/20/2022   After Visit Summary: (MyChart) Due to this being a telephonic visit, the after visit summary with patients personalized plan was offered to patient via MyChart   Nurse Notes: None

## 2022-10-20 NOTE — Patient Instructions (Signed)
Monica Kane , Thank you for taking time to come for your Medicare Wellness Visit. I appreciate your ongoing commitment to your health goals. Please review the following plan we discussed and let me know if I can assist you in the future.   This is a list of the screening recommended for you and due dates:  Health Maintenance  Topic Date Due   Zoster (Shingles) Vaccine (1 of 2) Never done   COVID-19 Vaccine (6 - 2023-24 season) 12/18/2021   Flu Shot  11/18/2022   Hemoglobin A1C  04/20/2023   Yearly kidney function blood test for diabetes  07/19/2023   Eye exam for diabetics  07/26/2023   Yearly kidney health urinalysis for diabetes  10/18/2023   Complete foot exam   10/18/2023   Medicare Annual Wellness Visit  10/20/2023   Mammogram  04/15/2024   Colon Cancer Screening  07/15/2025   DTaP/Tdap/Td vaccine (3 - Td or Tdap) 10/30/2028   Pneumonia Vaccine  Completed   DEXA scan (bone density measurement)  Completed   Hepatitis C Screening  Completed   HPV Vaccine  Aged Out    Next appointment: Follow up in one year for your annual wellness visit.   Preventive Care 31 Years and Older, Female Preventive care refers to lifestyle choices and visits with your health care provider that can promote health and wellness. What does preventive care include? A yearly physical exam. This is also called an annual well check. Dental exams once or twice a year. Routine eye exams. Ask your health care provider how often you should have your eyes checked. Personal lifestyle choices, including: Daily care of your teeth and gums. Regular physical activity. Eating a healthy diet. Avoiding tobacco and drug use. Limiting alcohol use. Practicing safe sex. Taking low-dose aspirin every day. Taking vitamin and mineral supplements as recommended by your health care provider. What happens during an annual well check? The services and screenings done by your health care provider during your annual well  check will depend on your age, overall health, lifestyle risk factors, and family history of disease. Counseling  Your health care provider may ask you questions about your: Alcohol use. Tobacco use. Drug use. Emotional well-being. Home and relationship well-being. Sexual activity. Eating habits. History of falls. Memory and ability to understand (cognition). Work and work Astronomer. Reproductive health. Screening  You may have the following tests or measurements: Height, weight, and BMI. Blood pressure. Lipid and cholesterol levels. These may be checked every 5 years, or more frequently if you are over 1 years old. Skin check. Lung cancer screening. You may have this screening every year starting at age 11 if you have a 30-pack-year history of smoking and currently smoke or have quit within the past 15 years. Fecal occult blood test (FOBT) of the stool. You may have this test every year starting at age 46. Flexible sigmoidoscopy or colonoscopy. You may have a sigmoidoscopy every 5 years or a colonoscopy every 10 years starting at age 23. Hepatitis C blood test. Hepatitis B blood test. Sexually transmitted disease (STD) testing. Diabetes screening. This is done by checking your blood sugar (glucose) after you have not eaten for a while (fasting). You may have this done every 1-3 years. Bone density scan. This is done to screen for osteoporosis. You may have this done starting at age 91. Mammogram. This may be done every 1-2 years. Talk to your health care provider about how often you should have regular mammograms. Talk with your  health care provider about your test results, treatment options, and if necessary, the need for more tests. Vaccines  Your health care provider may recommend certain vaccines, such as: Influenza vaccine. This is recommended every year. Tetanus, diphtheria, and acellular pertussis (Tdap, Td) vaccine. You may need a Td booster every 10 years. Zoster  vaccine. You may need this after age 48. Pneumococcal 13-valent conjugate (PCV13) vaccine. One dose is recommended after age 62. Pneumococcal polysaccharide (PPSV23) vaccine. One dose is recommended after age 65. Talk to your health care provider about which screenings and vaccines you need and how often you need them. This information is not intended to replace advice given to you by your health care provider. Make sure you discuss any questions you have with your health care provider. Document Released: 05/02/2015 Document Revised: 12/24/2015 Document Reviewed: 02/04/2015 Elsevier Interactive Patient Education  2017 [place] Prevention in the Home Falls can cause injuries. They can happen to people of all ages. There are many things you can do to make your home safe and to help prevent falls. What can I do on the outside of my home? Regularly fix the edges of walkways and driveways and fix any cracks. Remove anything that might make you trip as you walk through a door, such as a raised step or threshold. Trim any bushes or trees on the path to your home. Use bright outdoor lighting. Clear any walking paths of anything that might make someone trip, such as rocks or tools. Regularly check to see if handrails are loose or broken. Make sure that both sides of any steps have handrails. Any raised decks and porches should have guardrails on the edges. Have any leaves, snow, or ice cleared regularly. Use sand or salt on walking paths during winter. Clean up any spills in your garage right away. This includes oil or grease spills. What can I do in the bathroom? Use night lights. Install grab bars by the toilet and in the tub and shower. Do not use towel bars as grab bars. Use non-skid mats or decals in the tub or shower. If you need to sit down in the shower, use a plastic, non-slip stool. Keep the floor dry. Clean up any water that spills on the floor as soon as it happens. Remove  soap buildup in the tub or shower regularly. Attach bath mats securely with double-sided non-slip rug tape. Do not have throw rugs and other things on the floor that can make you trip. What can I do in the bedroom? Use night lights. Make sure that you have a light by your bed that is easy to reach. Do not use any sheets or blankets that are too big for your bed. They should not hang down onto the floor. Have a firm chair that has side arms. You can use this for support while you get dressed. Do not have throw rugs and other things on the floor that can make you trip. What can I do in the kitchen? Clean up any spills right away. Avoid walking on wet floors. Keep items that you use a lot in easy-to-reach places. If you need to reach something above you, use a strong step stool that has a grab bar. Keep electrical cords out of the way. Do not use floor polish or wax that makes floors slippery. If you must use wax, use non-skid floor wax. Do not have throw rugs and other things on the floor that can make you trip. What  can I do with my stairs? Do not leave any items on the stairs. Make sure that there are handrails on both sides of the stairs and use them. Fix handrails that are broken or loose. Make sure that handrails are as long as the stairways. Check any carpeting to make sure that it is firmly attached to the stairs. Fix any carpet that is loose or worn. Avoid having throw rugs at the top or bottom of the stairs. If you do have throw rugs, attach them to the floor with carpet tape. Make sure that you have a light switch at the top of the stairs and the bottom of the stairs. If you do not have them, ask someone to add them for you. What else can I do to help prevent falls? Wear shoes that: Do not have high heels. Have rubber bottoms. Are comfortable and fit you well. Are closed at the toe. Do not wear sandals. If you use a stepladder: Make sure that it is fully opened. Do not climb a  closed stepladder. Make sure that both sides of the stepladder are locked into place. Ask someone to hold it for you, if possible. Clearly mark and make sure that you can see: Any grab bars or handrails. First and last steps. Where the edge of each step is. Use tools that help you move around (mobility aids) if they are needed. These include: Canes. Walkers. Scooters. Crutches. Turn on the lights when you go into a dark area. Replace any light bulbs as soon as they burn out. Set up your furniture so you have a clear path. Avoid moving your furniture around. If any of your floors are uneven, fix them. If there are any pets around you, be aware of where they are. Review your medicines with your doctor. Some medicines can make you feel dizzy. This can increase your chance of falling. Ask your doctor what other things that you can do to help prevent falls. This information is not intended to replace advice given to you by your health care provider. Make sure you discuss any questions you have with your health care provider. Document Released: 01/30/2009 Document Revised: 09/11/2015 Document Reviewed: 05/10/2014 Elsevier Interactive Patient Education  2017 [employer].

## 2022-11-08 DIAGNOSIS — M48061 Spinal stenosis, lumbar region without neurogenic claudication: Secondary | ICD-10-CM | POA: Diagnosis not present

## 2022-11-23 DIAGNOSIS — M48061 Spinal stenosis, lumbar region without neurogenic claudication: Secondary | ICD-10-CM | POA: Diagnosis not present

## 2022-12-09 DIAGNOSIS — M5416 Radiculopathy, lumbar region: Secondary | ICD-10-CM | POA: Diagnosis not present

## 2022-12-15 ENCOUNTER — Encounter: Payer: Self-pay | Admitting: Gastroenterology

## 2022-12-15 ENCOUNTER — Ambulatory Visit: Payer: Medicare Other | Admitting: Gastroenterology

## 2022-12-15 ENCOUNTER — Other Ambulatory Visit (INDEPENDENT_AMBULATORY_CARE_PROVIDER_SITE_OTHER): Payer: Medicare Other

## 2022-12-15 VITALS — BP 110/70 | HR 83 | Ht 65.0 in | Wt 154.0 lb

## 2022-12-15 DIAGNOSIS — K5900 Constipation, unspecified: Secondary | ICD-10-CM | POA: Diagnosis not present

## 2022-12-15 DIAGNOSIS — R195 Other fecal abnormalities: Secondary | ICD-10-CM | POA: Diagnosis not present

## 2022-12-15 DIAGNOSIS — K862 Cyst of pancreas: Secondary | ICD-10-CM | POA: Diagnosis not present

## 2022-12-15 DIAGNOSIS — R140 Abdominal distension (gaseous): Secondary | ICD-10-CM | POA: Diagnosis not present

## 2022-12-15 DIAGNOSIS — R194 Change in bowel habit: Secondary | ICD-10-CM | POA: Diagnosis not present

## 2022-12-15 DIAGNOSIS — D3A8 Other benign neuroendocrine tumors: Secondary | ICD-10-CM | POA: Diagnosis not present

## 2022-12-15 DIAGNOSIS — Z86010 Personal history of colonic polyps: Secondary | ICD-10-CM | POA: Diagnosis not present

## 2022-12-15 LAB — COMPREHENSIVE METABOLIC PANEL WITH GFR
ALT: 26 U/L (ref 0–35)
AST: 21 U/L (ref 0–37)
Albumin: 4.4 g/dL (ref 3.5–5.2)
Alkaline Phosphatase: 65 U/L (ref 39–117)
BUN: 24 mg/dL — ABNORMAL HIGH (ref 6–23)
CO2: 26 meq/L (ref 19–32)
Calcium: 10 mg/dL (ref 8.4–10.5)
Chloride: 107 meq/L (ref 96–112)
Creatinine, Ser: 1.07 mg/dL (ref 0.40–1.20)
GFR: 50.97 mL/min — ABNORMAL LOW
Glucose, Bld: 125 mg/dL — ABNORMAL HIGH (ref 70–99)
Potassium: 4.6 meq/L (ref 3.5–5.1)
Sodium: 140 meq/L (ref 135–145)
Total Bilirubin: 0.6 mg/dL (ref 0.2–1.2)
Total Protein: 7.2 g/dL (ref 6.0–8.3)

## 2022-12-15 LAB — CBC
HCT: 46.1 % — ABNORMAL HIGH (ref 36.0–46.0)
Hemoglobin: 14.7 g/dL (ref 12.0–15.0)
MCHC: 31.9 g/dL (ref 30.0–36.0)
MCV: 80.1 fl (ref 78.0–100.0)
Platelets: 230 10*3/uL (ref 150.0–400.0)
RBC: 5.75 Mil/uL — ABNORMAL HIGH (ref 3.87–5.11)
RDW: 14.9 % (ref 11.5–15.5)
WBC: 8.3 10*3/uL (ref 4.0–10.5)

## 2022-12-15 LAB — C-REACTIVE PROTEIN: CRP: <1 mg/dL (ref 0.5–20.0)

## 2022-12-15 LAB — SEDIMENTATION RATE: Sed Rate: 9 mm/h (ref 0–30)

## 2022-12-15 NOTE — Patient Instructions (Addendum)
Your provider has requested that you go to the basement level for lab work before leaving today. Press "B" on the elevator. The lab is located at the first door on the left as you exit the elevator.  You have been given a testing kit to check for small intestine bacterial overgrowth (SIBO) which is completed by a company named [facility]. Make sure to return your test in the mail using the return mailing label given to you along with the kit. The test order, your demographic and insurance information have all already been sent to the company. [facility] will collect an upfront charge of $99.74 for commercial insurance plans and $209.74 is you are paying cash. Make sure to discuss with [facility] PRIOR to having the test to see if they have gotten information from your insurance company as to how much your testing will cost out of pocket, if any. Please contact [facility] at phone number [phone] to get instructions regarding how to perform the test as our office is unable to give specific testing instructions.  Toileting tips to help with your constipation - Drink at least 64-80 ounces of water/liquid per day. - Establish a time to try to move your bowels every day.  For many people, this is after a cup of coffee or after a meal such as breakfast. - Sit all of the way back on the toilet keeping your back fairly straight and while sitting up, try to rest the tops of your forearms on your upper thighs.   - Raising your feet with a step stool/squatty potty can be helpful to improve the angle that allows your stool to pass through the rectum. - Relax the rectum feeling it bulge toward the toilet water.  If you feel your rectum raising toward your body, you are contracting rather than relaxing. - Breathe in and slowly exhale. "Belly breath" by expanding your belly towards your belly button. Keep belly expanded as you gently direct pressure down and back to the anus.  A low pitched GRRR  sound can assist with increasing intra-abdominal pressure.  - Repeat 3-4 times. If unsuccessful, contract the pelvic floor to restore normal tone and get off the toilet.  Avoid excessive straining. - To reduce excessive wiping by teaching your anus to normally contract, place hands on outer aspect of knees and resist knee movement outward.  Hold 5-10 second then place hands just inside of knees and resist inward movement of knees.  Hold 5 seconds.  Repeat a few times each way.  Start Fiber Con once daily.   Use Miralax 1 capful once daily + Dulcolax 10 mg -one pill every other day as needed.   You have been scheduled for your Colonoscopy for 03/02/23 at 8:30 am and Telephone pre-visit for 02/23/23 at 9:00 am    Thank you for choosing me and [facility].  Dr. Meridee Score

## 2022-12-15 NOTE — Progress Notes (Signed)
GASTROENTEROLOGY OUTPATIENT CLINIC VISIT   Primary Care Provider Copland, Gwenlyn Found, MD [address] [place] [address] [phone]   Patient Profile: Monica Kane is a 77 y.o. female with a pmh significant for hypertension, hyperlipidemia, history of PE (on anticoagulation) anxiety, MDD, prior kidney stones, prediabetes, family history pancreas cancer (aunt), colon polyps (TA's), duodenal neuroendocrine tumor (status post resection).  The patient presents to the [facility] for an evaluation and management of problem(s) noted below:  Problem List 1. Change in bowel habits   2. Change in stool caliber   3. Hx of adenomatous colonic polyps   4. Bloating   5. Distended abdomen   6. Constipation, unspecified constipation type   7. Pancreatic cyst   8. Neuroendocrine tumor     History of Present Illness See prior notes for full details of HPI.  Interval History The patient returns for follow-up today.  Patient has noted over the course of the last few months a change in her bowel habits.  Her stools are becoming smaller in caliber.  Sometimes she notices the stool floating in the toilet.  She is also noted increased amounts of constipation.  She is not use any laxative therapy.  She gets fiber intake through dietary use.  She is concerned about this particular change and wonders whether colonoscopy may be indicated in her history of prior colon polyps.  She did have repeat MRI/MRCP imaging as outlined below and follow-up with Dr. Freida Busman with plan for 1 year follow-up MRI/MRCP.  She is not having any abdominal pain or discomfort.  GI Review of Systems Positive as above including bloating and abdominal distention Negative for dysphagia, odynophagia, nausea, vomiting, change in appetite, melena, hematochezia  Review of Systems General: Denies fevers/chills/weight loss unintentionally Cardiovascular: Denies chest pain Pulmonary: Denies  shortness of breath Gastroenterological: See HPI Genitourinary: Denies darkened urine or hematuria Hematological: Positive for history of easy bruising/bleeding to anticoagulation Dermatological: Denies jaundice Psychological: Mood is stable   Medications Current Outpatient Medications  Medication Sig Dispense Refill   alendronate (FOSAMAX) 70 MG tablet Take 1 tablet (70 mg total) by mouth every 7 (seven) days. Take with a full glass of water on an empty stomach. 12 tablet 3   amitriptyline (ELAVIL) 25 MG tablet Take 1 tablet (25 mg total) by mouth at bedtime. 90 tablet 3   carvedilol (COREG) 3.125 MG tablet TAKE ONE TABLET BY MOUTH TWICE DAILY WITH A MEAL (Patient taking differently: Take 3.125 mg by mouth at bedtime.) 180 tablet 1   lisinopril (ZESTRIL) 40 MG tablet TAKE ONE TABLET BY MOUTH DAILY 90 tablet 3   metFORMIN (GLUCOPHAGE) 500 MG tablet TAKE ONE TABLET BY MOUTH DAILY. MAY START WITH 1/2 TAB AND INCREASE AS TOLERATED (Patient taking differently: Take 500 mg by mouth daily with breakfast.) 90 tablet 1   omeprazole (PRILOSEC) 20 MG capsule Take 1 capsule (20 mg total) by mouth 2 (two) times daily before a meal. Twice daily for 1 month then may go back to once daily. 60 capsule 6   rivaroxaban (XARELTO) 20 MG TABS tablet TAKE ONE TABLET BY MOUTH DAILY with SUPPER 90 tablet 1   rosuvastatin (CRESTOR) 10 MG tablet Take 1 tablet (10 mg total) by mouth at bedtime. 90 tablet 1   sertraline (ZOLOFT) 100 MG tablet TAKE 2 & 1/2 TABLETS DAILY. 225 tablet 3   traZODone (DESYREL) 50 MG tablet TAKE 1 TO 2 TABLETS BY MOUTH AT BEDTIME (Patient taking  differently: Take 100 mg by mouth at bedtime.) 180 tablet 1   No current facility-administered medications for this visit.    Allergies Allergies  Allergen Reactions   Penicillins Swelling and Rash    Facial swelling    Histories Past Medical History:  Diagnosis Date   Allergy    Rash, some swelling on face   Anxiety    takes Ativan  nightly   Depression    takes Zoloft daily   GERD (gastroesophageal reflux disease) March 2024   History of colon polyps    History of kidney stones    History of staph infection 11/2013   Hyperlipidemia    takes Crestor nightly    Hypertension    takes Lisinopril and Coreg daily   Joint pain    Joint swelling    Neuropathy    takes Elavil nightly   Pneumonia    Pre-diabetes    Primary localized osteoarthritis of right knee    Pulmonary embolism (HCC) 07/08/2021   Sleep apnea    uses oral appliance   Past Surgical History:  Procedure Laterality Date   ABDOMINAL HYSTERECTOMY  04/20/1987   APPENDECTOMY     BIOPSY  05/13/2022   Procedure: BIOPSY;  Surgeon: Lemar Lofty., MD;  Location: [facility];  Service: Gastroenterology;;   BREAST BIOPSY Left 02/16/2016   BREAST EXCISIONAL BIOPSY Right 2008   CARDIAC CATHETERIZATION  04/19/2013   CERVICAL SPINE SURGERY  04/19/2002   C 5-6   COLONOSCOPY     colonscopy     DIAGNOSTIC LAPAROSCOPY  04/19/1974   fibroid removed   ELBOW ARTHROSCOPY  2010, 2012   ESOPHAGOGASTRODUODENOSCOPY (EGD) WITH PROPOFOL N/A 05/13/2022   Procedure: ESOPHAGOGASTRODUODENOSCOPY (EGD) WITH PROPOFOL;  Surgeon: Lemar Lofty., MD;  Location: [facility];  Service: Gastroenterology;  Laterality: N/A;   ESOPHAGOGASTRODUODENOSCOPY (EGD) WITH PROPOFOL N/A 07/15/2022   Procedure: ESOPHAGOGASTRODUODENOSCOPY (EGD) WITH PROPOFOL;  Surgeon: Meridee Score Netty Starring., MD;  Location: [facility];  Service: Gastroenterology;  Laterality: N/A;   EUS N/A 07/15/2022   Procedure: UPPER ENDOSCOPIC ULTRASOUND (EUS) RADIAL;  Surgeon: Lemar Lofty., MD;  Location: [facility];  Service: Gastroenterology;  Laterality: N/A;   EYE SURGERY Bilateral    cataract   FINE NEEDLE ASPIRATION N/A 05/13/2022   Procedure: FINE NEEDLE ASPIRATION (FNA) LINEAR;  Surgeon: Lemar Lofty., MD;  Location: [facility];  Service: Gastroenterology;  Laterality:  N/A;   FOOT SURGERY Left 04/20/2011   GASTROCNEMIUS RECESSION Right 10/09/2019   Procedure: RIGHT GASTROCNEMIUS RECESSION;  Surgeon: Nadara Mustard, MD;  Location: [facility];  Service: Orthopedics;  Laterality: Right;   HEMOSTASIS CLIP PLACEMENT  07/15/2022   Procedure: HEMOSTASIS CLIP PLACEMENT;  Surgeon: Lemar Lofty., MD;  Location: [facility];  Service: Gastroenterology;;   HYSTEROTOMY  1989   JOINT REPLACEMENT  01/2014   Right knee   KNEE ARTHROSCOPY  01/17/2013   KNEE ARTHROSCOPY W/ MENISCECTOMY Right 01/31/2013   [facility]   LATERAL EPICONDYLE RELEASE  03/01/2011   Procedure: TENNIS ELBOW RELEASE;  Surgeon: Nilda Simmer, MD;  Location:  SURGERY CENTER;  Service: Orthopedics;  Laterality: Left;  TENOTOMY ELBOW LATERAL EPICONDYLITIS TENNIS ELBOW   lateral release right elbow  04/19/2008   left foot surgery  04/20/2011   mortons neuroma   LEFT HEART CATHETERIZATION WITH CORONARY ANGIOGRAM N/A 07/23/2013   Procedure: LEFT HEART CATHETERIZATION WITH CORONARY ANGIOGRAM;  Surgeon: Lesleigh Noe, MD;  Location: [facility];  Service: Cardiovascular;  Laterality: N/A;  PAROTID GLAND TUMOR EXCISION  04/19/1974   benign   PAROTID GLAND TUMOR EXCISION  04/19/1974   rt   SHOULDER ARTHROSCOPY DISTAL CLAVICLE EXCISION AND OPEN ROTATOR CUFF REPAIR  04/19/2004   SHOULDER ARTHROSCOPY W/ ROTATOR CUFF REPAIR  04/19/2004   SUBMUCOSAL LIFTING INJECTION  07/15/2022   Procedure: SUBMUCOSAL LIFTING INJECTION;  Surgeon: Lemar Lofty., MD;  Location: [facility];  Service: Gastroenterology;;   TOTAL KNEE ARTHROPLASTY Right 02/18/2014   Procedure: TOTAL KNEE ARTHROPLASTY;  Surgeon: Nilda Simmer, MD;  Location: [facility];  Service: Orthopedics;  Laterality: Right;   UPPER ESOPHAGEAL ENDOSCOPIC ULTRASOUND (EUS) N/A 05/13/2022   Procedure: UPPER ESOPHAGEAL ENDOSCOPIC ULTRASOUND (EUS);  Surgeon: Lemar Lofty., MD;  Location: [facility];  Service:  Gastroenterology;  Laterality: N/A;   WEIL OSTEOTOMY Right 10/09/2019   Procedure: WEIL OSTEOTOMY RIGHT 2ND METATARSAL;  Surgeon: Nadara Mustard, MD;  Location: [facility];  Service: Orthopedics;  Laterality: Right;   Social History   Socioeconomic History   Marital status: Married    Spouse name: Santoria Doran   Number of children: Not on file   Years of education: Not on file   Highest education level: Bachelor's degree (e.g., BA, AB, BS)  Occupational History   Not on file  Tobacco Use   Smoking status: Never   Smokeless tobacco: Never  Vaping Use   Vaping status: Never Used  Substance and Sexual Activity   Alcohol use: Yes    Comment: Occasionally on weekends   Drug use: Never   Sexual activity: Not Currently    Birth control/protection: Surgical  Other Topics Concern   Not on file  Social History Narrative   Lives with her husband.  He has 2 adult children. Patient has a Naval architect, and she does exercise.   Social Determinants of Health   Financial Resource Strain: Low Risk  (10/13/2022)   Overall Financial Resource Strain (CARDIA)    Difficulty of Paying Living Expenses: Not hard at all  Food Insecurity: No Food Insecurity (10/13/2022)   Hunger Vital Sign    Worried About Running Out of Food in the Last Year: Never true    Ran Out of Food in the Last Year: Never true  Transportation Needs: No Transportation Needs (10/13/2022)   PRAPARE - Administrator, Civil Service (Medical): No    Lack of Transportation (Non-Medical): No  Physical Activity: Sufficiently Active (10/13/2022)   Exercise Vital Sign    Days of Exercise per Week: 3 days    Minutes of Exercise per Session: 80 min  Stress: No Stress Concern Present (10/13/2022)   [employer] of Occupational Health - Occupational Stress Questionnaire    Feeling of Stress : Only a little  Social Connections: Moderately Isolated (10/13/2022)   Social Connection and Isolation Panel  [NHANES]    Frequency of Communication with Friends and Family: Once a week    Frequency of Social Gatherings with Friends and Family: Twice a week    Attends Religious Services: Never    Database administrator or Organizations: No    Attends Engineer, structural: Not on file    Marital Status: Married  Catering manager Violence: Not At Risk (10/20/2022)   Humiliation, Afraid, Rape, and Kick questionnaire    Fear of Current or Ex-Partner: No    Emotionally Abused: No    Physically Abused: No    Sexually Abused: No   Family History  Problem Relation Age of Onset  Cancer Mother        lung   Diabetes Mother    Heart disease Mother    Hypertension Mother    Kidney disease Mother    Cancer Father        lung; + tobacco   Alcohol abuse Father    Diabetes Sister    Hypertension Sister    Hyperlipidemia Sister    Kidney failure Sister    Cancer Sister    Depression Sister    Pancreatic cancer Maternal Aunt    Heart disease Maternal Grandfather        heart attack   Heart disease Paternal Grandfather        heart attack   Breast cancer Neg Hx    Colon cancer Neg Hx    Esophageal cancer Neg Hx    Inflammatory bowel disease Neg Hx    Liver disease Neg Hx    Rectal cancer Neg Hx    Stomach cancer Neg Hx    I have reviewed her medical, social, and family history in detail and updated the electronic medical record as necessary.    PHYSICAL EXAMINATION  BP 110/70   Pulse 83   Ht 5\' 5"  (1.651 m)   Wt 154 lb (69.9 kg)   BMI 25.63 kg/m  Wt Readings from Last 3 Encounters:  12/15/22 154 lb (69.9 kg)  10/18/22 158 lb 9.6 oz (71.9 kg)  07/19/22 162 lb 6.4 oz (73.7 kg)  GEN: NAD, appears stated age, doesn't appear chronically ill PSYCH: Cooperative, without pressured speech EYE: Conjunctivae pink, sclerae anicteric ENT: MMM CV: Nontachycardic RESP: No audible wheezing GI: NABS, soft, NT/ND, without rebound or guarding MSK/EXT: No lower extremity edema SKIN: No  jaundice NEURO:  Alert & Oriented x 3, no focal deficits   REVIEW OF DATA  I reviewed the following data at the time of this encounter:  GI Procedures and Studies  3/24 EUS EGD impression: - No gross lesions in the entire esophagus. Z-line regular. -3 cm hiatal hernia noted. - Erythematous mucosa in the stomach. Previously biopsied and negative for H. pylori. - Duodenal scar at site of previous neuroendocrine tumor. No evidence of persisting disease present endoscopically. After EUS, I attempted resection of the scar but was not able to lift or place a band around the scar. Proceeded with forcep avulsion which was successful. Clips (MR conditional) were placed. Clip manufacturer: [employer]. - No other gross lesions in the duodenal bulb, in the first portion of the duodenum and in the second portion of the duodenum. EUS impression: - No evidence of any intramural lesions in the duodenal bulb/apex/second portion of the duodenum were noted. - No evidence of any intramural lesion noted in the pylorus. - No malignant-appearing lymph nodes were visualized in the left gastric region (level 17), gastrohepatic ligament (level 18) and celiac region (level 20).  Laboratory Studies  Reviewed those in epic and care everywhere  Imaging Studies  6/24 MR/MRCP IMPRESSION: Two stable pancreatic cystic lesions measuring 19 mm in the pancreatic tail, favoring side branch IPMNs. Follow-up MRI abdomen with/without contrast is suggested in 1 year.   ASSESSMENT  Ms. Leininger is a 77 y.o. female with a pmh significant for hypertension, hyperlipidemia, history of PE (on anticoagulation) anxiety, MDD, prior kidney stones, prediabetes, family history pancreas cancer (aunt), colon polyps (TA's), duodenal neuroendocrine tumor (status post resection).  The patient is seen today for evaluation and management of:  1. Change in bowel habits   2. Change in  stool caliber   3. Hx of adenomatous colonic polyps   4.  Bloating   5. Distended abdomen   6. Constipation, unspecified constipation type   7. Pancreatic cyst   8. Neuroendocrine tumor    The patient is hemodynamically stable.  Patient has a history of adenomatous colon polyps.  She has had a change in her bowel habits with progressive constipation.  We are going to optimize her bowel habits with a bowel regimen and laxative therapy.  With the significant change in her bowel habits, it does make sense to consider colonoscopic evaluation.  We are going to tentatively set that up but if her symptoms improve with more aggressive bowel regimen and fiber supplementation we may hold on that for period of time.  Time will tell.  She will have her pancreatic cyst monitor with Dr. Freida Busman and has repeat MRI/MRCP scheduled for 2025.  For her duodenal neuroendocrine tumor status post previous removal and negative EUS earlier this year, I am going to recommend repeat EUS in 2025 to reevaluate the site.  All patient questions were answered to the best of my ability, and the patient agrees to the aforementioned plan of action with follow-up as indicated.   PLAN  Laboratories as outlined below Stool studies as outlined below Proceed with scheduling colonoscopy for colon polyp surveillance with incidental change in bowel habits as well Initiate FiberCon daily Toileting techniques discussed with patient Initiate MiraLAX once to twice daily and may use Dulcolax 10 mg as needed -If inflammatory markers are elevated, we will likely proceed with colonoscopy even if patient feels better with laxative therapy Surveillance upper EUS in 3/25 MR/MRCP in 6/25 (per Dr. Freida Busman)    Orders Placed This Encounter  Procedures   Calprotectin, Fecal   CBC   Comp Met (CMET)   Calcium, ionized   TSH   Sedimentation rate   C-reactive protein   IgA   Tissue transglutaminase, IgA   Pancreatic elastase, fecal    New Prescriptions   No medications on file   Modified Medications    No medications on file    Planned Follow Up No follow-ups on file.   Total Time in Face-to-Face and in Coordination of Care for patient including independent/personal interpretation/review of prior testing, medical history, examination, medication adjustment, communicating results with the patient directly, and documentation within the EHR is 25 minutes.   Corliss Parish, MD [facility] Advanced [facility]oscopy Office # [phone]

## 2022-12-16 LAB — IGA: Immunoglobulin A: 227 mg/dL (ref 70–320)

## 2022-12-16 LAB — TISSUE TRANSGLUTAMINASE, IGA: (tTG) Ab, IgA: <1 U/mL

## 2022-12-16 LAB — TSH: TSH: 1.82 u[IU]/mL (ref 0.35–5.50)

## 2022-12-16 LAB — CALCIUM, IONIZED: Calcium, Ion: 5.3 mg/dL (ref 4.7–5.5)

## 2022-12-17 ENCOUNTER — Encounter: Payer: Self-pay | Admitting: Gastroenterology

## 2022-12-17 ENCOUNTER — Telehealth: Payer: Self-pay

## 2022-12-17 DIAGNOSIS — R194 Change in bowel habit: Secondary | ICD-10-CM | POA: Insufficient documentation

## 2022-12-17 DIAGNOSIS — R140 Abdominal distension (gaseous): Secondary | ICD-10-CM | POA: Insufficient documentation

## 2022-12-17 DIAGNOSIS — D3A8 Other benign neuroendocrine tumors: Secondary | ICD-10-CM | POA: Insufficient documentation

## 2022-12-17 DIAGNOSIS — K5900 Constipation, unspecified: Secondary | ICD-10-CM | POA: Insufficient documentation

## 2022-12-17 DIAGNOSIS — R195 Other fecal abnormalities: Secondary | ICD-10-CM | POA: Insufficient documentation

## 2022-12-17 NOTE — Telephone Encounter (Signed)
  Good Morning Dr. Myna Hidalgo,  We have Mrs. Monica Kane  scheduled for a(n) Colonoscopy procedure. Our records show that (s)he is on anticoagulation therapy.  Please advise as to whether the patient may come off their therapy of Xarelto 2 days prior to their procedure which is scheduled for 03/02/23.  Please route your response to Lucky Rathke, CMA  or fax response to [phone].  Sincerely,    [facility]

## 2022-12-21 NOTE — Telephone Encounter (Signed)
Left detailed message for patient letting her know that per Dr. Myna Hidalgo she can hold  her Xarelto 2 days prior to procedure. Patient does have pre-visit appointment and will also be informed at that time as well.

## 2022-12-22 ENCOUNTER — Other Ambulatory Visit: Payer: Medicare Other

## 2022-12-22 DIAGNOSIS — K5900 Constipation, unspecified: Secondary | ICD-10-CM | POA: Diagnosis not present

## 2022-12-22 DIAGNOSIS — R140 Abdominal distension (gaseous): Secondary | ICD-10-CM

## 2022-12-22 DIAGNOSIS — R194 Change in bowel habit: Secondary | ICD-10-CM | POA: Diagnosis not present

## 2022-12-25 LAB — CALPROTECTIN, FECAL: Calprotectin, Fecal: 88 ug/g (ref 0–120)

## 2022-12-27 ENCOUNTER — Other Ambulatory Visit: Payer: Self-pay

## 2022-12-27 DIAGNOSIS — R194 Change in bowel habit: Secondary | ICD-10-CM

## 2022-12-27 DIAGNOSIS — R195 Other fecal abnormalities: Secondary | ICD-10-CM

## 2022-12-27 DIAGNOSIS — R140 Abdominal distension (gaseous): Secondary | ICD-10-CM

## 2022-12-27 DIAGNOSIS — K862 Cyst of pancreas: Secondary | ICD-10-CM

## 2022-12-28 ENCOUNTER — Encounter: Payer: Self-pay | Admitting: Gastroenterology

## 2022-12-30 LAB — PANCREATIC ELASTASE, FECAL: Pancreatic Elastase-1, Stool: >500 ug/g

## 2023-01-04 DIAGNOSIS — M25551 Pain in right hip: Secondary | ICD-10-CM | POA: Diagnosis not present

## 2023-01-04 DIAGNOSIS — R141 Gas pain: Secondary | ICD-10-CM | POA: Diagnosis not present

## 2023-01-04 DIAGNOSIS — M25552 Pain in left hip: Secondary | ICD-10-CM | POA: Diagnosis not present

## 2023-01-05 DIAGNOSIS — M25552 Pain in left hip: Secondary | ICD-10-CM | POA: Diagnosis not present

## 2023-01-11 ENCOUNTER — Other Ambulatory Visit: Payer: Self-pay | Admitting: Family Medicine

## 2023-01-11 DIAGNOSIS — E119 Type 2 diabetes mellitus without complications: Secondary | ICD-10-CM

## 2023-01-11 DIAGNOSIS — E785 Hyperlipidemia, unspecified: Secondary | ICD-10-CM

## 2023-01-11 DIAGNOSIS — I2699 Other pulmonary embolism without acute cor pulmonale: Secondary | ICD-10-CM

## 2023-01-12 ENCOUNTER — Encounter: Payer: Self-pay | Admitting: Gastroenterology

## 2023-01-14 ENCOUNTER — Other Ambulatory Visit: Payer: Self-pay | Admitting: Family Medicine

## 2023-01-14 DIAGNOSIS — G4700 Insomnia, unspecified: Secondary | ICD-10-CM

## 2023-01-14 NOTE — Telephone Encounter (Signed)
Since she still is having symptoms, I would recommend we move forward with a colonoscopy. Let her begin with the MiraLAX as she has noted. If she would like some samples of Linzess or Trulance, we can give those to her in case she wants those to be available while she is [place] of the [place]. Thanks. GM

## 2023-01-18 ENCOUNTER — Inpatient Hospital Stay: Payer: Medicare Other | Admitting: Hematology & Oncology

## 2023-01-18 ENCOUNTER — Inpatient Hospital Stay: Payer: Medicare Other

## 2023-01-19 ENCOUNTER — Other Ambulatory Visit: Payer: Self-pay

## 2023-01-19 ENCOUNTER — Encounter: Payer: Self-pay | Admitting: Family Medicine

## 2023-01-19 ENCOUNTER — Encounter: Payer: Self-pay | Admitting: Gastroenterology

## 2023-01-19 DIAGNOSIS — Z23 Encounter for immunization: Secondary | ICD-10-CM | POA: Diagnosis not present

## 2023-01-19 MED ORDER — RIFAXIMIN 550 MG PO TABS: 550.0000 mg | ORAL_TABLET | Freq: Three times a day (TID) | ORAL | 0 refills | Status: AC

## 2023-01-19 NOTE — Progress Notes (Signed)
The pt returned call and has been advised of the results and prescription sent to the pharmacy. She is aware that this may need PA . She will reach out with any issues or concerns.

## 2023-01-19 NOTE — Progress Notes (Signed)
[facility] SIBO breath test  Hydrogen 39 ppm Methane 30 ppm Increase in combine hydrogen/methane 68 ppm  Analysis of the data suggest bacterial overgrowth is suspected  Will plan to send Xifaxan 550 mg 3 times daily times 14 days.  Will have this scanned this in the chart and a copy sent to the patient for her records so that the patient is aware of the results as well.  Corliss Parish, MD [facility] Advanced [facility]oscopy Office # [phone]

## 2023-01-19 NOTE — Progress Notes (Signed)
Left message on machine to call back  Prescription sent to the pharmacy

## 2023-01-20 DIAGNOSIS — M25551 Pain in right hip: Secondary | ICD-10-CM | POA: Diagnosis not present

## 2023-01-21 ENCOUNTER — Telehealth: Payer: Self-pay | Admitting: Pharmacy Technician

## 2023-01-21 ENCOUNTER — Encounter: Payer: Self-pay | Admitting: Gastroenterology

## 2023-01-21 NOTE — Telephone Encounter (Signed)
Pharmacy Patient Advocate Encounter   Received notification from CoverMyMeds that prior authorization for XIFAXAN 550MG  is required/requested.   Insurance verification completed.   The patient is insured through [employer] .   Per test claim: PA required; PA submitted to [employer] via CoverMyMeds Key/confirmation #/EOC [id number] Status is pending

## 2023-01-24 ENCOUNTER — Encounter: Payer: Self-pay | Admitting: Gastroenterology

## 2023-01-24 ENCOUNTER — Other Ambulatory Visit: Payer: Self-pay

## 2023-01-24 MED ORDER — SULFAMETHOXAZOLE-TRIMETHOPRIM 800-160 MG PO TABS: 1.0000 | ORAL_TABLET | Freq: Two times a day (BID) | ORAL | 0 refills | Status: DC

## 2023-01-24 NOTE — Telephone Encounter (Signed)
I see her penicillin allergy. Although it will not necessarily be as effective as the data would suggest, she could use Bactrim double strength twice daily for 10 days versus Flagyl 500 mg 3 times daily for 10 days. She may choose either of these though the Bactrim may be easier to tolerate. Let me know what she decides. Thanks. GM

## 2023-01-24 NOTE — Telephone Encounter (Signed)
Thanks for the update Bonita Quin. GM

## 2023-01-24 NOTE — Telephone Encounter (Signed)
See separate patient advice request note for details of plan. Thanks. GM

## 2023-01-25 NOTE — Telephone Encounter (Signed)
FYI:  Dr Mansouraty  

## 2023-01-25 NOTE — Telephone Encounter (Signed)
Noted    GM

## 2023-01-26 NOTE — Telephone Encounter (Signed)
Dr Meridee Score please see the message below. The Xifaxan is not covered for bacterial over growth.  Please advise next steps.

## 2023-01-26 NOTE — Telephone Encounter (Signed)
Pharmacy Patient Advocate Encounter  Received notification from [employer] that Prior Authorization for [facility] 550MG  has been DENIED.  Full denial letter will be uploaded to the media tab. See denial reason below.   PA #/Case ID/Reference #: [id number]

## 2023-01-27 NOTE — Telephone Encounter (Signed)
If issues with obtaining, then will need to proceed with Bactrim as we had discussed previously. She has IBS but not IBS-D. GM

## 2023-01-27 NOTE — Telephone Encounter (Signed)
See My Chart message the pt decided to purchase the xifaxan

## 2023-01-28 NOTE — Telephone Encounter (Signed)
PT is calling about the Xifaxin denial. They have since purchased it but want to appeal the PA. They have paperwork they need help processing. Please advise.

## 2023-01-28 NOTE — Telephone Encounter (Signed)
See MyChart for further communication.

## 2023-01-28 NOTE — Telephone Encounter (Signed)
Left message on machine to call back  

## 2023-02-22 ENCOUNTER — Other Ambulatory Visit: Payer: Medicare Other

## 2023-02-23 ENCOUNTER — Other Ambulatory Visit: Payer: Medicare Other

## 2023-02-23 ENCOUNTER — Ambulatory Visit (AMBULATORY_SURGERY_CENTER): Payer: Medicare Other

## 2023-02-23 VITALS — Ht 65.0 in | Wt 153.0 lb

## 2023-02-23 DIAGNOSIS — K862 Cyst of pancreas: Secondary | ICD-10-CM

## 2023-02-23 DIAGNOSIS — R140 Abdominal distension (gaseous): Secondary | ICD-10-CM

## 2023-02-23 DIAGNOSIS — R194 Change in bowel habit: Secondary | ICD-10-CM | POA: Diagnosis not present

## 2023-02-23 DIAGNOSIS — R195 Other fecal abnormalities: Secondary | ICD-10-CM | POA: Diagnosis not present

## 2023-02-23 MED ORDER — NA SULFATE-K SULFATE-MG SULF 17.5-3.13-1.6 GM/177ML PO SOLN
1.0000 | Freq: Once | ORAL | 0 refills | Status: AC
Start: 2023-02-23 — End: 2023-02-23

## 2023-02-23 NOTE — Progress Notes (Signed)

## 2023-02-25 ENCOUNTER — Inpatient Hospital Stay: Payer: Medicare Other | Attending: Hematology & Oncology

## 2023-02-25 ENCOUNTER — Encounter: Payer: Self-pay | Admitting: Oncology

## 2023-02-25 ENCOUNTER — Inpatient Hospital Stay (HOSPITAL_BASED_OUTPATIENT_CLINIC_OR_DEPARTMENT_OTHER): Payer: Medicare Other | Admitting: Oncology

## 2023-02-25 VITALS — BP 146/65 | HR 70 | Temp 99.0°F | Wt 158.0 lb

## 2023-02-25 DIAGNOSIS — R140 Abdominal distension (gaseous): Secondary | ICD-10-CM | POA: Insufficient documentation

## 2023-02-25 DIAGNOSIS — I2699 Other pulmonary embolism without acute cor pulmonale: Secondary | ICD-10-CM | POA: Diagnosis not present

## 2023-02-25 DIAGNOSIS — R109 Unspecified abdominal pain: Secondary | ICD-10-CM | POA: Insufficient documentation

## 2023-02-25 DIAGNOSIS — Z7901 Long term (current) use of anticoagulants: Secondary | ICD-10-CM | POA: Diagnosis not present

## 2023-02-25 DIAGNOSIS — R600 Localized edema: Secondary | ICD-10-CM | POA: Insufficient documentation

## 2023-02-25 DIAGNOSIS — Z86711 Personal history of pulmonary embolism: Secondary | ICD-10-CM | POA: Insufficient documentation

## 2023-02-25 DIAGNOSIS — Z86718 Personal history of other venous thrombosis and embolism: Secondary | ICD-10-CM

## 2023-02-25 LAB — CMP (CANCER CENTER ONLY)
ALT: 13 U/L (ref 0–44)
AST: 17 U/L (ref 15–41)
Albumin: 4.3 g/dL (ref 3.5–5.0)
Alkaline Phosphatase: 44 U/L (ref 38–126)
Anion gap: 6 (ref 5–15)
BUN: 12 mg/dL (ref 8–23)
CO2: 28 mmol/L (ref 22–32)
Calcium: 9.8 mg/dL (ref 8.9–10.3)
Chloride: 110 mmol/L (ref 98–111)
Creatinine: 0.98 mg/dL (ref 0.44–1.00)
GFR, Estimated: >60 mL/min (ref >60)
Glucose, Bld: 139 mg/dL — ABNORMAL HIGH (ref 70–99)
Potassium: 4.8 mmol/L (ref 3.5–5.1)
Sodium: 144 mmol/L (ref 135–145)
Total Bilirubin: 0.5 mg/dL (ref <1.2)
Total Protein: 6.4 g/dL — ABNORMAL LOW (ref 6.5–8.1)

## 2023-02-25 LAB — CBC WITH DIFFERENTIAL (CANCER CENTER ONLY)
Abs Immature Granulocytes: 0.01 10*3/uL (ref 0.00–0.07)
Basophils Absolute: 0 10*3/uL (ref 0.0–0.1)
Basophils Relative: 1 %
Eosinophils Absolute: 0.1 10*3/uL (ref 0.0–0.5)
Eosinophils Relative: 1 %
HCT: 37.8 % (ref 36.0–46.0)
Hemoglobin: 12.4 g/dL (ref 12.0–15.0)
Immature Granulocytes: 0 %
Lymphocytes Relative: 20 %
Lymphs Abs: 1.2 10*3/uL (ref 0.7–4.0)
MCH: 26.9 pg (ref 26.0–34.0)
MCHC: 32.8 g/dL (ref 30.0–36.0)
MCV: 82 fL (ref 80.0–100.0)
Monocytes Absolute: 0.4 10*3/uL (ref 0.1–1.0)
Monocytes Relative: 6 %
Neutro Abs: 4.4 10*3/uL (ref 1.7–7.7)
Neutrophils Relative %: 72 %
Platelet Count: 182 10*3/uL (ref 150–400)
RBC: 4.61 MIL/uL (ref 3.87–5.11)
RDW: 13.9 % (ref 11.5–15.5)
WBC Count: 6.1 10*3/uL (ref 4.0–10.5)
nRBC: 0 % (ref 0.0–0.2)

## 2023-02-25 MED ORDER — RIVAROXABAN 20 MG PO TABS: 20.0000 mg | ORAL_TABLET | Freq: Every day | ORAL | 1 refills | Status: DC

## 2023-02-25 NOTE — Progress Notes (Signed)
Hematology and Oncology Follow Up Visit  Monica Kane [id number] [birth year] 77 y.o. 02/25/2023   Principle Diagnosis:  Pulmonary embolism-bilateral-idiopathic  Current Therapy:   Xarelto 20 mg p.o. daily-started on 05/24/2021     Interim History:  Monica Kane is back for follow-up.  She remains on Xarelto 20 mg daily and is tolerating this well.  She is not having any bleeding episodes or other side effects.  She has continued to have some GI concerns including abdominal pain and bloating.  Monica Kane is being seen by GI and will be having a colonoscopy next week.  She notices some fullness/bloating in the epigastric area.  Monica Kane also has some discomfort over the right upper quadrant and mid abdomen.  She reports changes in the caliber of her stools.  Denies constipation, diarrhea, melena, hematochezia.  She is also being followed by surgery for pancreatic cyst.  Last MRI was in June 2024 which showed 2 stable pancreatic cysts.  She is not reporting any fevers, chills, chest pain, cough, shortness of breath.  Has intermittent lower extremity edema.  Notices that her spider veins on her bilateral ankles/feet are more prominent.  Overall, I would say performance status is probably ECOG 1.    Wt Readings from Last 3 Encounters:  02/25/23 71.7 kg  02/23/23 69.4 kg  12/15/22 69.9 kg     Medications:  Current Outpatient Medications:    alendronate (FOSAMAX) 70 MG tablet, Take 1 tablet (70 mg total) by mouth every 7 (seven) days. Take with a full glass of water on an empty stomach., Disp: 12 tablet, Rfl: 3   amitriptyline (ELAVIL) 25 MG tablet, Take 1 tablet (25 mg total) by mouth at bedtime., Disp: 90 tablet, Rfl: 3   carvedilol (COREG) 3.125 MG tablet, TAKE ONE TABLET BY MOUTH TWICE DAILY WITH A MEAL (Patient taking differently: Take 3.125 mg by mouth at bedtime.), Disp: 180 tablet, Rfl: 1   lisinopril (ZESTRIL) 40 MG tablet, TAKE ONE TABLET BY MOUTH DAILY, Disp: 90 tablet, Rfl: 3   metFORMIN  (GLUCOPHAGE) 500 MG tablet, TAKE ONE TABLET BY MOUTH DAILY. MAY START WITH 1/2 TAB AND INCREASE AS TOLERATED, Disp: 90 tablet, Rfl: 1   omeprazole (PRILOSEC) 20 MG capsule, Take 1 capsule (20 mg total) by mouth 2 (two) times daily before a meal. Twice daily for 1 month then may go back to once daily., Disp: 60 capsule, Rfl: 6   polycarbophil (FIBERCON) 625 MG tablet, Take 625 mg by mouth daily., Disp: , Rfl:    rosuvastatin (CRESTOR) 10 MG tablet, Take 1 tablet (10 mg total) by mouth at bedtime., Disp: 90 tablet, Rfl: 1   sertraline (ZOLOFT) 100 MG tablet, TAKE 2 & 1/2 TABLETS DAILY., Disp: 225 tablet, Rfl: 3   traZODone (DESYREL) 50 MG tablet, Take 1-2 tablets (50-100 mg total) by mouth at bedtime., Disp: 180 tablet, Rfl: 1   rivaroxaban (XARELTO) 20 MG TABS tablet, Take 1 tablet (20 mg total) by mouth daily with supper., Disp: 90 tablet, Rfl: 1  Allergies:  Allergies  Allergen Reactions   Penicillins Swelling and Rash    Facial swelling    Past Medical History, Surgical history, Social history, and Family History were reviewed and updated.  Review of Systems: Review of Systems  Constitutional: Negative.   HENT:  Negative.    Eyes: Negative.   Respiratory: Negative.    Cardiovascular: Negative.   Gastrointestinal:  Positive for abdominal distention and abdominal pain. Negative for blood in stool, constipation, diarrhea, nausea and  vomiting.  Endocrine: Negative.   Genitourinary: Negative.    Musculoskeletal: Negative.   Skin: Negative.   Neurological: Negative.   Hematological: Negative.   Psychiatric/Behavioral: Negative.      Physical Exam:  weight is 71.7 kg. Her oral temperature is 99 F (37.2 C). Her blood pressure is 146/65 (abnormal) and her pulse is 70.   Wt Readings from Last 3 Encounters:  02/25/23 71.7 kg  02/23/23 69.4 kg  12/15/22 69.9 kg    Physical Exam Vitals reviewed.  HENT:     Head: Normocephalic and atraumatic.  Eyes:     Pupils: Pupils are equal,  round, and reactive to light.  Cardiovascular:     Rate and Rhythm: Normal rate and regular rhythm.     Heart sounds: Normal heart sounds.  Pulmonary:     Effort: Pulmonary effort is normal.     Breath sounds: Normal breath sounds.  Abdominal:     General: There is distension.     Palpations: There is no mass.     Tenderness: There is no abdominal tenderness. There is no guarding.     Comments: Hypoactive bowel sounds  Musculoskeletal:        General: No tenderness or deformity. Normal range of motion.     Cervical back: Normal range of motion.  Lymphadenopathy:     Cervical: No cervical adenopathy.  Skin:    General: Skin is warm and dry.     Findings: No erythema or rash.  Neurological:     Mental Status: She is alert and oriented to person, place, and time.  Psychiatric:        Behavior: Behavior normal.        Thought Content: Thought content normal.        Judgment: Judgment normal.      Lab Results  Component Value Date   WBC 6.1 02/25/2023   HGB 12.4 02/25/2023   HCT 37.8 02/25/2023   MCV 82.0 02/25/2023   PLT 182 02/25/2023     Chemistry      Component Value Date/Time   NA 144 02/25/2023 0903   K 4.8 02/25/2023 0903   CL 110 02/25/2023 0903   CO2 28 02/25/2023 0903   BUN 12 02/25/2023 0903   CREATININE 0.98 02/25/2023 0903   CREATININE 0.81 08/12/2016 1348      Component Value Date/Time   CALCIUM 9.8 02/25/2023 0903   ALKPHOS 44 02/25/2023 0903   AST 17 02/25/2023 0903   ALT 13 02/25/2023 0903   BILITOT 0.5 02/25/2023 0903      Impression and Plan: Monica Kane is a very nice 77 year old white female.  She had bilateral pulmonary emboli.  This is idiopathic from all of her studies to date.  Thankfully, she did not have a high clot burden.  As outlined previously, the plan is to continue anticoagulation for 2 years and after that we will place her on maintenance anticoagulation.  February 2025 will be 2 years.  Therefore, will schedule her back for  follow-up in 2025 to make a final decision.  Will check a D-dimer on this date per patient request.  She will continue to follow-up with GI for her abdominal concerns.  And, she is scheduled for a colonoscopy next week.  Clenton Pare, NP 11/8/202411:40 AM

## 2023-02-27 LAB — CALPROTECTIN, FECAL: Calprotectin, Fecal: 19 ug/g (ref 0–120)

## 2023-03-02 ENCOUNTER — Encounter: Payer: Self-pay | Admitting: Gastroenterology

## 2023-03-02 ENCOUNTER — Ambulatory Visit: Payer: Medicare Other | Admitting: Gastroenterology

## 2023-03-02 VITALS — BP 129/56 | HR 69 | Temp 96.6°F | Resp 17 | Ht 65.0 in | Wt 153.0 lb

## 2023-03-02 DIAGNOSIS — Z1211 Encounter for screening for malignant neoplasm of colon: Secondary | ICD-10-CM

## 2023-03-02 DIAGNOSIS — R194 Change in bowel habit: Secondary | ICD-10-CM | POA: Diagnosis not present

## 2023-03-02 DIAGNOSIS — F32A Depression, unspecified: Secondary | ICD-10-CM | POA: Diagnosis not present

## 2023-03-02 DIAGNOSIS — F419 Anxiety disorder, unspecified: Secondary | ICD-10-CM | POA: Diagnosis not present

## 2023-03-02 DIAGNOSIS — K644 Residual hemorrhoidal skin tags: Secondary | ICD-10-CM

## 2023-03-02 DIAGNOSIS — Z860101 Personal history of adenomatous and serrated colon polyps: Secondary | ICD-10-CM

## 2023-03-02 DIAGNOSIS — R7303 Prediabetes: Secondary | ICD-10-CM | POA: Diagnosis not present

## 2023-03-02 DIAGNOSIS — I10 Essential (primary) hypertension: Secondary | ICD-10-CM | POA: Diagnosis not present

## 2023-03-02 MED ORDER — SODIUM CHLORIDE 0.9 % IV SOLN: 500.0000 mL | Freq: Once | INTRAVENOUS | Status: DC

## 2023-03-02 NOTE — Progress Notes (Signed)
Pt's states no medical or surgical changes since previsit or office visit. 

## 2023-03-02 NOTE — Progress Notes (Signed)
Called to room to assist during endoscopic procedure.  Patient ID and intended procedure confirmed with present staff. Received instructions for my participation in the procedure from the performing physician.  

## 2023-03-02 NOTE — Progress Notes (Signed)
GASTROENTEROLOGY PROCEDURE H&P NOTE   Primary Care Physician: Pearline Cables, MD  HPI: Monica Kane is a 77 y.o. female who presents for Colonoscopy for screening with some incidental changes in bowel habits.  Past Medical History:  Diagnosis Date   Allergy    Rash, some swelling on face   Anxiety    takes Ativan nightly   Depression    takes Zoloft daily   GERD (gastroesophageal reflux disease) March 2024   History of colon polyps    History of kidney stones    History of staph infection 11/2013   Hyperlipidemia    takes Crestor nightly    Hypertension    takes Lisinopril and Coreg daily   Joint pain    Joint swelling    Neuropathy    takes Elavil nightly   Pneumonia    Pre-diabetes    Primary localized osteoarthritis of right knee    Pulmonary embolism (HCC) 07/08/2021   Sleep apnea    uses oral appliance   Past Surgical History:  Procedure Laterality Date   ABDOMINAL HYSTERECTOMY  04/20/1987   APPENDECTOMY     BIOPSY  05/13/2022   Procedure: BIOPSY;  Surgeon: Lemar Lofty., MD;  Location: [facility];  Service: Gastroenterology;;   BREAST BIOPSY Left 02/16/2016   BREAST EXCISIONAL BIOPSY Right 2008   CARDIAC CATHETERIZATION  04/19/2013   CERVICAL SPINE SURGERY  04/19/2002   C 5-6   COLONOSCOPY     colonscopy     DIAGNOSTIC LAPAROSCOPY  04/19/1974   fibroid removed   ELBOW ARTHROSCOPY  2010, 2012   ESOPHAGOGASTRODUODENOSCOPY (EGD) WITH PROPOFOL N/A 05/13/2022   Procedure: ESOPHAGOGASTRODUODENOSCOPY (EGD) WITH PROPOFOL;  Surgeon: Lemar Lofty., MD;  Location: [facility];  Service: Gastroenterology;  Laterality: N/A;   ESOPHAGOGASTRODUODENOSCOPY (EGD) WITH PROPOFOL N/A 07/15/2022   Procedure: ESOPHAGOGASTRODUODENOSCOPY (EGD) WITH PROPOFOL;  Surgeon: Meridee Score Netty Starring., MD;  Location: [facility];  Service: Gastroenterology;  Laterality: N/A;   EUS N/A 07/15/2022   Procedure: UPPER ENDOSCOPIC ULTRASOUND (EUS) RADIAL;   Surgeon: Lemar Lofty., MD;  Location: [facility];  Service: Gastroenterology;  Laterality: N/A;   EYE SURGERY Bilateral    cataract   FINE NEEDLE ASPIRATION N/A 05/13/2022   Procedure: FINE NEEDLE ASPIRATION (FNA) LINEAR;  Surgeon: Lemar Lofty., MD;  Location: [facility];  Service: Gastroenterology;  Laterality: N/A;   FOOT SURGERY Left 04/20/2011   GASTROCNEMIUS RECESSION Right 10/09/2019   Procedure: RIGHT GASTROCNEMIUS RECESSION;  Surgeon: Nadara Mustard, MD;  Location: [facility];  Service: Orthopedics;  Laterality: Right;   HEMOSTASIS CLIP PLACEMENT  07/15/2022   Procedure: HEMOSTASIS CLIP PLACEMENT;  Surgeon: Lemar Lofty., MD;  Location: [facility];  Service: Gastroenterology;;   HYSTEROTOMY  1989   JOINT REPLACEMENT  01/2014   Right knee   KNEE ARTHROSCOPY  01/17/2013   KNEE ARTHROSCOPY W/ MENISCECTOMY Right 01/31/2013   [facility]   LATERAL EPICONDYLE RELEASE  03/01/2011   Procedure: TENNIS ELBOW RELEASE;  Surgeon: Nilda Simmer, MD;  Location: [facility];  Service: Orthopedics;  Laterality: Left;  TENOTOMY ELBOW LATERAL EPICONDYLITIS TENNIS ELBOW   lateral release right elbow  04/19/2008   left foot surgery  04/20/2011   mortons neuroma   LEFT HEART CATHETERIZATION WITH CORONARY ANGIOGRAM N/A 07/23/2013   Procedure: LEFT HEART CATHETERIZATION WITH CORONARY ANGIOGRAM;  Surgeon: Lesleigh Noe, MD;  Location: [facility];  Service: Cardiovascular;  Laterality: N/A;   PAROTID GLAND TUMOR EXCISION  04/19/1974   benign   PAROTID GLAND TUMOR EXCISION  04/19/1974   rt   SHOULDER ARTHROSCOPY DISTAL CLAVICLE EXCISION AND OPEN ROTATOR CUFF REPAIR  04/19/2004   SHOULDER ARTHROSCOPY W/ ROTATOR CUFF REPAIR  04/19/2004   SUBMUCOSAL LIFTING INJECTION  07/15/2022   Procedure: SUBMUCOSAL LIFTING INJECTION;  Surgeon: Lemar Lofty., MD;  Location: [facility];  Service: Gastroenterology;;   TOTAL KNEE ARTHROPLASTY  Right 02/18/2014   Procedure: TOTAL KNEE ARTHROPLASTY;  Surgeon: Nilda Simmer, MD;  Location: [facility];  Service: Orthopedics;  Laterality: Right;   UPPER ESOPHAGEAL ENDOSCOPIC ULTRASOUND (EUS) N/A 05/13/2022   Procedure: UPPER ESOPHAGEAL ENDOSCOPIC ULTRASOUND (EUS);  Surgeon: Lemar Lofty., MD;  Location: [facility];  Service: Gastroenterology;  Laterality: N/A;   WEIL OSTEOTOMY Right 10/09/2019   Procedure: WEIL OSTEOTOMY RIGHT 2ND METATARSAL;  Surgeon: Nadara Mustard, MD;  Location: [facility];  Service: Orthopedics;  Laterality: Right;   Current Outpatient Medications  Medication Sig Dispense Refill   alendronate (FOSAMAX) 70 MG tablet Take 1 tablet (70 mg total) by mouth every 7 (seven) days. Take with a full glass of water on an empty stomach. 12 tablet 3   amitriptyline (ELAVIL) 25 MG tablet Take 1 tablet (25 mg total) by mouth at bedtime. 90 tablet 3   carvedilol (COREG) 3.125 MG tablet TAKE ONE TABLET BY MOUTH TWICE DAILY WITH A MEAL (Patient taking differently: Take 3.125 mg by mouth at bedtime.) 180 tablet 1   lisinopril (ZESTRIL) 40 MG tablet TAKE ONE TABLET BY MOUTH DAILY 90 tablet 3   metFORMIN (GLUCOPHAGE) 500 MG tablet TAKE ONE TABLET BY MOUTH DAILY. MAY START WITH 1/2 TAB AND INCREASE AS TOLERATED 90 tablet 1   omeprazole (PRILOSEC) 20 MG capsule Take 1 capsule (20 mg total) by mouth 2 (two) times daily before a meal. Twice daily for 1 month then may go back to once daily. 60 capsule 6   polycarbophil (FIBERCON) 625 MG tablet Take 625 mg by mouth daily.     rosuvastatin (CRESTOR) 10 MG tablet Take 1 tablet (10 mg total) by mouth at bedtime. 90 tablet 1   sertraline (ZOLOFT) 100 MG tablet TAKE 2 & 1/2 TABLETS DAILY. 225 tablet 3   traZODone (DESYREL) 50 MG tablet Take 1-2 tablets (50-100 mg total) by mouth at bedtime. 180 tablet 1   rivaroxaban (XARELTO) 20 MG TABS tablet Take 1 tablet (20 mg total) by mouth daily with supper. 90 tablet 1   Current  Facility-Administered Medications  Medication Dose Route Frequency Provider Last Rate Last Admin   0.9 %  sodium chloride infusion  500 mL Intravenous Once Mansouraty, Netty Starring., MD        Current Outpatient Medications:    alendronate (FOSAMAX) 70 MG tablet, Take 1 tablet (70 mg total) by mouth every 7 (seven) days. Take with a full glass of water on an empty stomach., Disp: 12 tablet, Rfl: 3   amitriptyline (ELAVIL) 25 MG tablet, Take 1 tablet (25 mg total) by mouth at bedtime., Disp: 90 tablet, Rfl: 3   carvedilol (COREG) 3.125 MG tablet, TAKE ONE TABLET BY MOUTH TWICE DAILY WITH A MEAL (Patient taking differently: Take 3.125 mg by mouth at bedtime.), Disp: 180 tablet, Rfl: 1   lisinopril (ZESTRIL) 40 MG tablet, TAKE ONE TABLET BY MOUTH DAILY, Disp: 90 tablet, Rfl: 3   metFORMIN (GLUCOPHAGE) 500 MG tablet, TAKE ONE TABLET BY MOUTH DAILY. MAY START WITH 1/2 TAB AND INCREASE AS TOLERATED, Disp: 90 tablet,  Rfl: 1   omeprazole (PRILOSEC) 20 MG capsule, Take 1 capsule (20 mg total) by mouth 2 (two) times daily before a meal. Twice daily for 1 month then may go back to once daily., Disp: 60 capsule, Rfl: 6   polycarbophil (FIBERCON) 625 MG tablet, Take 625 mg by mouth daily., Disp: , Rfl:    rosuvastatin (CRESTOR) 10 MG tablet, Take 1 tablet (10 mg total) by mouth at bedtime., Disp: 90 tablet, Rfl: 1   sertraline (ZOLOFT) 100 MG tablet, TAKE 2 & 1/2 TABLETS DAILY., Disp: 225 tablet, Rfl: 3   traZODone (DESYREL) 50 MG tablet, Take 1-2 tablets (50-100 mg total) by mouth at bedtime., Disp: 180 tablet, Rfl: 1   rivaroxaban (XARELTO) 20 MG TABS tablet, Take 1 tablet (20 mg total) by mouth daily with supper., Disp: 90 tablet, Rfl: 1  Current Facility-Administered Medications:    0.9 %  sodium chloride infusion, 500 mL, Intravenous, Once, Mansouraty, Netty Starring., MD Allergies  Allergen Reactions   Penicillins Swelling and Rash    Facial swelling   Family History  Problem Relation Age of Onset    Cancer Mother        lung   Diabetes Mother    Heart disease Mother    Hypertension Mother    Kidney disease Mother    Cancer Father        lung; + tobacco   Alcohol abuse Father    Kidney disease Sister    Colon polyps Sister    Diabetes Sister    Hypertension Sister    Hyperlipidemia Sister    Kidney failure Sister    Cancer Sister    Depression Sister    Pancreatic cancer Maternal Aunt    Heart disease Maternal Grandfather        heart attack   Heart disease Paternal Grandfather        heart attack   Colon cancer Nephew    Breast cancer Neg Hx    Esophageal cancer Neg Hx    Inflammatory bowel disease Neg Hx    Liver disease Neg Hx    Rectal cancer Neg Hx    Stomach cancer Neg Hx    Social History   Socioeconomic History   Marital status: Married    Spouse name: Orean Daris   Number of children: Not on file   Years of education: Not on file   Highest education level: Bachelor's degree (e.g., BA, AB, BS)  Occupational History   Not on file  Tobacco Use   Smoking status: Never   Smokeless tobacco: Never  Vaping Use   Vaping status: Never Used  Substance and Sexual Activity   Alcohol use: Yes    Comment: Occasionally on weekends   Drug use: Never   Sexual activity: Not Currently    Birth control/protection: Surgical  Other Topics Concern   Not on file  Social History Narrative   Lives with her husband.  He has 2 adult children. Patient has a Naval architect, and she does exercise.   Social Determinants of Health   Financial Resource Strain: Low Risk  (10/13/2022)   Overall Financial Resource Strain (CARDIA)    Difficulty of Paying Living Expenses: Not hard at all  Food Insecurity: No Food Insecurity (10/13/2022)   Hunger Vital Sign    Worried About Running Out of Food in the Last Year: Never true    Ran Out of Food in the Last Year: Never true  Transportation Needs: No Transportation Needs (10/13/2022)  PRAPARE - Scientist, research (physical sciences) (Medical): No    Lack of Transportation (Non-Medical): No  Physical Activity: Sufficiently Active (10/13/2022)   Exercise Vital Sign    Days of Exercise per Week: 3 days    Minutes of Exercise per Session: 80 min  Stress: No Stress Concern Present (10/13/2022)   [employer] of Occupational Health - Occupational Stress Questionnaire    Feeling of Stress : Only a little  Social Connections: Moderately Isolated (10/13/2022)   Social Connection and Isolation Panel [NHANES]    Frequency of Communication with Friends and Family: Once a week    Frequency of Social Gatherings with Friends and Family: Twice a week    Attends Religious Services: Never    Database administrator or Organizations: No    Attends Engineer, structural: Not on file    Marital Status: Married  Catering manager Violence: Not At Risk (10/20/2022)   Humiliation, Afraid, Rape, and Kick questionnaire    Fear of Current or Ex-Partner: No    Emotionally Abused: No    Physically Abused: No    Sexually Abused: No    Physical Exam: Today's Vitals   03/02/23 0728  BP: (!) 149/85  Pulse: 94  Temp: (!) 96.6 F (35.9 C)  TempSrc: Skin  SpO2: 98%  Weight: 153 lb (69.4 kg)  Height: 5\' 5"  (1.651 m)   Body mass index is 25.46 kg/m. GEN: NAD EYE: Sclerae anicteric ENT: MMM CV: Non-tachycardic GI: Soft, NT/ND NEURO:  Alert & Oriented x 3  Lab Results: No results for input(s): "WBC", "HGB", "HCT", "PLT" in the last 72 hours. BMET No results for input(s): "NA", "K", "CL", "CO2", "GLUCOSE", "BUN", "CREATININE", "CALCIUM" in the last 72 hours. LFT No results for input(s): "PROT", "ALBUMIN", "AST", "ALT", "ALKPHOS", "BILITOT", "BILIDIR", "IBILI" in the last 72 hours. PT/INR No results for input(s): "LABPROT", "INR" in the last 72 hours.   Impression / Plan: This is a 77 y.o.female who presents for Colonoscopy for screening with some incidental changes in bowel habits.  The risks and benefits  of endoscopic evaluation/treatment were discussed with the patient and/or family; these include but are not limited to the risk of perforation, infection, bleeding, missed lesions, lack of diagnosis, severe illness requiring hospitalization, as well as anesthesia and sedation related illnesses.  The patient's history has been reviewed, patient examined, no change in status, and deemed stable for procedure.  The patient and/or family is agreeable to proceed.    Corliss Parish, MD [facility] Office # [phone]

## 2023-03-02 NOTE — Patient Instructions (Signed)
Discharge instructions given. Handout on Hemorrhoids and High Fiber diet.  Resume Xarelto tomorrow. Resume other medications today. See report recommendations. YOU HAD AN ENDOSCOPIC PROCEDURE TODAY AT THE [facility]:   Refer to the procedure report that was given to you for any specific questions about what was found during the examination.  If the procedure report does not answer your questions, please call your gastroenterologist to clarify.  If you requested that your care partner not be given the details of your procedure findings, then the procedure report has been included in a sealed envelope for you to review at your convenience later.  YOU SHOULD EXPECT: Some feelings of bloating in the abdomen. Passage of more gas than usual.  Walking can help get rid of the air that was put into your GI tract during the procedure and reduce the bloating. If you had a lower endoscopy (such as a colonoscopy or flexible sigmoidoscopy) you may notice spotting of blood in your stool or on the toilet paper. If you underwent a bowel prep for your procedure, you may not have a normal bowel movement for a few days.  Please Note:  You might notice some irritation and congestion in your nose or some drainage.  This is from the oxygen used during your procedure.  There is no need for concern and it should clear up in a day or so.  SYMPTOMS TO REPORT IMMEDIATELY:  Following lower endoscopy (colonoscopy or flexible sigmoidoscopy):  Excessive amounts of blood in the stool  Significant tenderness or worsening of abdominal pains  Swelling of the abdomen that is new, acute  Fever of 100F or higher   For urgent or emergent issues, a gastroenterologist can be reached at any hour by calling [phone]. Do not use MyChart messaging for urgent concerns.    DIET:  We do recommend a small meal at first, but then you may proceed to your regular diet.  Drink plenty of fluids but you should avoid alcoholic  beverages for 24 hours.  ACTIVITY:  You should plan to take it easy for the rest of today and you should NOT DRIVE or use heavy machinery until tomorrow (because of the sedation medicines used during the test).    FOLLOW UP: Our staff will call the number listed on your records the next business day following your procedure.  We will call around 7:15- 8:00 am to check on you and address any questions or concerns that you may have regarding the information given to you following your procedure. If we do not reach you, we will leave a message.     If any biopsies were taken you will be contacted by phone or by letter within the next 1-3 weeks.  Please call us at [phone] if you have not heard about the biopsies in 3 weeks.    SIGNATURES/CONFIDENTIALITY: You and/or your care partner have signed paperwork which will be entered into your electronic medical record.  These signatures attest to the fact that that the information above on your After Visit Summary has been reviewed and is understood.  Full responsibility of the confidentiality of this discharge information lies with you and/or your care-partner.

## 2023-03-02 NOTE — Op Note (Signed)
[facility] Patient Name: Monica Kane Procedure Date: 03/02/2023 8:20 AM MRN: [id number] Endoscopist: Corliss Parish , MD, [id number] Age: 77 Referring MD:  Date of Birth: 15-Mar-1946 Gender: Female Account #: [id number] Procedure:                Colonoscopy Indications:              Screening for colorectal malignant neoplasm,                            Incidental change in bowel habits noted Medicines:                Monitored Anesthesia Care Procedure:                Pre-Anesthesia Assessment:                           - Prior to the procedure, a History and Physical                            was performed, and patient medications and                            allergies were reviewed. The patient's tolerance of                            previous anesthesia was also reviewed. The risks                            and benefits of the procedure and the sedation                            options and risks were discussed with the patient.                            All questions were answered, and informed consent                            was obtained. Prior Anticoagulants: The patient has                            taken Xarelto (rivaroxaban), last dose was 2 days                            prior to procedure. ASA Grade Assessment: III - A                            patient with severe systemic disease. After                            reviewing the risks and benefits, the patient was                            deemed in satisfactory condition to undergo the  procedure.                           After obtaining informed consent, the colonoscope                            was passed under direct vision. Throughout the                            procedure, the patient's blood pressure, pulse, and                            oxygen saturations were monitored continuously. The                            Olympus Scope SN: [id number] was introduced  through                            the anus and advanced to the 3 cm into the ileum.                            The colonoscopy was performed without difficulty.                            The patient tolerated the procedure. The quality of                            the bowel preparation was good. The terminal ileum,                            ileocecal valve, appendiceal orifice, and rectum                            were photographed. Scope In: 8:27:59 AM Scope Out: 8:39:33 AM Total Procedure Duration: 0 hours 11 minutes 34 seconds  Findings:                 Skin tags were found on perianal exam.                           The digital rectal exam findings include                            hemorrhoids. Pertinent negatives include no                            palpable rectal lesions.                           The terminal ileum and ileocecal valve appeared                            normal.                           Normal mucosa was found in  the entire colon.                            Biopsies for histology were taken with a cold                            forceps from the entire colon for evaluation of                            microscopic colitis.                           Non-bleeding non-thrombosed external and internal                            hemorrhoids were found during retroflexion, during                            perianal exam and during digital exam. The                            hemorrhoids were Grade II (internal hemorrhoids                            that prolapse but reduce spontaneously). Complications:            No immediate complications. Estimated Blood Loss:     Estimated blood loss was minimal. Impression:               - Perianal skin tags found on perianal exam.                           - Hemorrhoids found on digital rectal exam.                           - The examined portion of the ileum was normal.                           - Normal mucosa in the  entire examined colon.                            Biopsied.                           - Non-bleeding non-thrombosed external and internal                            hemorrhoids. Recommendation:           - The patient will be observed post-procedure,                            until all discharge criteria are met.                           - Discharge patient to home.                           -  Patient has a contact number available for                            emergencies. The signs and symptoms of potential                            delayed complications were discussed with the                            patient. Return to normal activities tomorrow.                            Written discharge instructions were provided to the                            patient.                           - High fiber diet.                           - Use FiberCon 1-2 tablets PO daily.                           - May restart Xarelto on 11/14.                           - Continue present medications.                           - Await pathology results.                           - Repeat colonoscopy in 10 years for screening                            purposes based on patient's medical comorbidities                            at the time (though would be 87).                           - The findings and recommendations were discussed                            with the patient.                           - The findings and recommendations were discussed                            with the patient's family. Corliss Parish, MD 03/02/2023 8:43:23 AM

## 2023-03-02 NOTE — Progress Notes (Signed)
Vss nad trans to pacu 

## 2023-03-03 ENCOUNTER — Telehealth: Payer: Self-pay

## 2023-03-03 NOTE — Telephone Encounter (Signed)
Left message on follow up call. 

## 2023-03-04 LAB — SURGICAL PATHOLOGY

## 2023-03-07 ENCOUNTER — Encounter: Payer: Self-pay | Admitting: Gastroenterology

## 2023-03-22 DIAGNOSIS — M25551 Pain in right hip: Secondary | ICD-10-CM | POA: Diagnosis not present

## 2023-03-22 DIAGNOSIS — M25552 Pain in left hip: Secondary | ICD-10-CM | POA: Diagnosis not present

## 2023-03-25 DIAGNOSIS — M25559 Pain in unspecified hip: Secondary | ICD-10-CM | POA: Diagnosis not present

## 2023-04-17 ENCOUNTER — Telehealth: Payer: Medicare Other | Admitting: Family

## 2023-04-17 DIAGNOSIS — J0100 Acute maxillary sinusitis, unspecified: Secondary | ICD-10-CM

## 2023-04-17 MED ORDER — DOXYCYCLINE HYCLATE 100 MG PO TABS: 100.0000 mg | ORAL_TABLET | Freq: Two times a day (BID) | ORAL | 0 refills | Status: DC

## 2023-04-17 NOTE — Progress Notes (Signed)
Virtual Visit Consent   Monica Kane, you are scheduled for a virtual visit with a [facility] provider today. Just as with appointments in the office, your consent must be obtained to participate. Your consent will be active for this visit and any virtual visit you may have with one of our providers in the next 365 days. If you have a MyChart account, a copy of this consent can be sent to you electronically.  As this is a virtual visit, video technology does not allow for your provider to perform a traditional examination. This may limit your provider's ability to fully assess your condition. If your provider identifies any concerns that need to be evaluated in person or the need to arrange testing (such as labs, EKG, etc.), we will make arrangements to do so. Although advances in technology are sophisticated, we cannot ensure that it will always work on either your end or our end. If the connection with a video visit is poor, the visit may have to be switched to a telephone visit. With either a video or telephone visit, we are not always able to ensure that we have a secure connection.  By engaging in this virtual visit, you consent to the provision of healthcare and authorize for your insurance to be billed (if applicable) for the services provided during this visit. Depending on your insurance coverage, you may receive a charge related to this service.  I need to obtain your verbal consent now. Are you willing to proceed with your visit today? Monica Kane has provided verbal consent on 04/17/2023 for a virtual visit (video or telephone). Monica Rodney, FNP  Date: 04/17/2023 11:01 AM  Virtual Visit via Video Note   I, Monica Kane, connected with  Monica Kane  ([id number], 06-Jul-1945) on 04/17/23 at 11:00 AM EST by a video-enabled telemedicine application and verified that I am speaking with the correct person using two identifiers.  Location: Patient: Virtual Visit Location Patient:  Home Provider: Virtual Visit Location Provider: Home Office   I discussed the limitations of evaluation and management by telemedicine and the availability of in person appointments. The patient expressed understanding and agreed to proceed.    History of Present Illness: Monica Kane is a 77 y.o. who identifies as a female who was assigned female at birth, and is being seen today for cough, sore throat, and fever for over a week.  HPI: URI  This is a new problem. The current episode started 1 to 4 weeks ago. The problem has been gradually worsening. The maximum temperature recorded prior to her arrival was 100.4 - 100.9 F. Associated symptoms include congestion, coughing, headaches, rhinorrhea, sinus pain, sneezing and a sore throat. Pertinent negatives include no ear pain or joint pain. Associated symptoms comments: Night sweat. She has tried increased fluids and NSAIDs for the symptoms. The treatment provided mild relief.    Problems:  Patient Active Problem List   Diagnosis Date Noted   Change in stool caliber 12/17/2022   Constipation 12/17/2022   Distended abdomen 12/17/2022   Change in bowel habits 12/17/2022   Bloating 12/17/2022   Neuroendocrine tumor 12/17/2022   Pancreatic cyst 03/31/2022   Elevated CA 19-9 level 03/31/2022   Family history of malignant neoplasm of pancreas 03/31/2022   Hx of adenomatous colonic polyps 03/31/2022   Chronic anticoagulation 03/31/2022   Pulmonary embolism (HCC) 07/08/2021   Acquired claw toe, right    Achilles tendon contracture, right    OSA (obstructive sleep  apnea) 07/26/2019   Vertigo 10/03/2018   Visual blurriness 10/03/2018   Controlled type 2 diabetes mellitus without complication, without long-term current use of insulin (HCC) 06/05/2015   Primary localized osteoarthritis of right knee    Anxiety    Chest pain, midsternal 07/23/2013   Osteopenia 03/23/2013   Kidney stones, calcium oxalate 03/23/2013   Hypertension 03/31/2011    Hyperlipidemia 03/31/2011   Vitamin D deficiency 03/31/2011   Hallux rigidus 03/24/2011   Metatarsalgia of left foot 03/24/2011   Lateral epicondylitis of left elbow 02/24/2011   Migraine 02/24/2011   Depression 02/24/2011   Arthritis 02/24/2011    Allergies:  Allergies  Allergen Reactions   Penicillins Swelling and Rash    Facial swelling   Medications:  Current Outpatient Medications:    doxycycline (VIBRA-TABS) 100 MG tablet, Take 1 tablet (100 mg total) by mouth 2 (two) times daily., Disp: 20 tablet, Rfl: 0   alendronate (FOSAMAX) 70 MG tablet, Take 1 tablet (70 mg total) by mouth every 7 (seven) days. Take with a full glass of water on an empty stomach., Disp: 12 tablet, Rfl: 3   amitriptyline (ELAVIL) 25 MG tablet, Take 1 tablet (25 mg total) by mouth at bedtime., Disp: 90 tablet, Rfl: 3   carvedilol (COREG) 3.125 MG tablet, TAKE ONE TABLET BY MOUTH TWICE DAILY WITH A MEAL (Patient taking differently: Take 3.125 mg by mouth at bedtime.), Disp: 180 tablet, Rfl: 1   lisinopril (ZESTRIL) 40 MG tablet, TAKE ONE TABLET BY MOUTH DAILY, Disp: 90 tablet, Rfl: 3   metFORMIN (GLUCOPHAGE) 500 MG tablet, TAKE ONE TABLET BY MOUTH DAILY. MAY START WITH 1/2 TAB AND INCREASE AS TOLERATED, Disp: 90 tablet, Rfl: 1   omeprazole (PRILOSEC) 20 MG capsule, Take 1 capsule (20 mg total) by mouth 2 (two) times daily before a meal. Twice daily for 1 month then may go back to once daily., Disp: 60 capsule, Rfl: 6   polycarbophil (FIBERCON) 625 MG tablet, Take 625 mg by mouth daily., Disp: , Rfl:    rivaroxaban (XARELTO) 20 MG TABS tablet, Take 1 tablet (20 mg total) by mouth daily with supper., Disp: 90 tablet, Rfl: 1   rosuvastatin (CRESTOR) 10 MG tablet, Take 1 tablet (10 mg total) by mouth at bedtime., Disp: 90 tablet, Rfl: 1   sertraline (ZOLOFT) 100 MG tablet, TAKE 2 & 1/2 TABLETS DAILY., Disp: 225 tablet, Rfl: 3   traZODone (DESYREL) 50 MG tablet, Take 1-2 tablets (50-100 mg total) by mouth at  bedtime., Disp: 180 tablet, Rfl: 1  Observations/Objective: Patient is well-developed, well-nourished in no acute distress.  Resting comfortably  at home.  Head is normocephalic, atraumatic.  No labored breathing.  Speech is clear and coherent with logical content.  Patient is alert and oriented at baseline.  Nasal congestion Rhinorrhea   Assessment and Plan: 1. Acute non-recurrent maxillary sinusitis (Primary) - doxycycline (VIBRA-TABS) 100 MG tablet; Take 1 tablet (100 mg total) by mouth 2 (two) times daily.  Dispense: 20 tablet; Refill: 0  - Take meds as prescribed - Use a cool mist humidifier  -Use saline nose sprays frequently -Force fluids -For any cough or congestion  Use plain Mucinex- regular strength or max strength is fine -For fever or aces or pains- take tylenol or ibuprofen. -Throat lozenges if help -Follow up if symptoms worsen or do not improve   Follow Up Instructions: I discussed the assessment and treatment plan with the patient. The patient was provided an opportunity to ask questions and all  were answered. The patient agreed with the plan and demonstrated an understanding of the instructions.  A copy of instructions were sent to the patient via MyChart unless otherwise noted below.     The patient was advised to call back or seek an in-person evaluation if the symptoms worsen or if the condition fails to improve as anticipated.    Monica Rodney, FNP

## 2023-04-21 DIAGNOSIS — M25551 Pain in right hip: Secondary | ICD-10-CM | POA: Diagnosis not present

## 2023-04-27 DIAGNOSIS — M25551 Pain in right hip: Secondary | ICD-10-CM | POA: Diagnosis not present

## 2023-04-28 ENCOUNTER — Encounter: Payer: Self-pay | Admitting: Family Medicine

## 2023-05-22 ENCOUNTER — Encounter: Payer: Self-pay | Admitting: Oncology

## 2023-05-23 ENCOUNTER — Encounter: Payer: Self-pay | Admitting: *Deleted

## 2023-05-23 DIAGNOSIS — M25551 Pain in right hip: Secondary | ICD-10-CM | POA: Diagnosis not present

## 2023-05-24 ENCOUNTER — Other Ambulatory Visit: Payer: Self-pay

## 2023-05-24 ENCOUNTER — Telehealth: Payer: Self-pay | Admitting: Family Medicine

## 2023-05-24 DIAGNOSIS — H903 Sensorineural hearing loss, bilateral: Secondary | ICD-10-CM | POA: Diagnosis not present

## 2023-05-24 DIAGNOSIS — H9190 Unspecified hearing loss, unspecified ear: Secondary | ICD-10-CM

## 2023-05-24 DIAGNOSIS — H93239 Hyperacusis, unspecified ear: Secondary | ICD-10-CM

## 2023-05-24 NOTE — Telephone Encounter (Signed)
 Copied from CRM [id number]. Topic: Referral - Request for Referral >> May 23, 2023  4:41 PM Viola F wrote: Did the patient discuss referral with their provider in the last year? Not sure - [facility] called not patient   (If No - schedule appointment) (If Yes - send message)  Appointment offered? No, office called    Type of order/referral and detailed reason for visit: Patient has hearing test 05/24/23 @ 1:30pm - Office called to request referral for patient visit   Preference of office, provider, location: [facility] - Fax number [phone],office asked if referral can be faxed   If referral order, have you been seen by this specialty before? No (If Yes, this issue or another issue? When? Where?  Can we respond through MyChart? No

## 2023-05-24 NOTE — Telephone Encounter (Signed)
 Referral has been faxed.

## 2023-05-27 ENCOUNTER — Encounter: Payer: Self-pay | Admitting: Hematology & Oncology

## 2023-05-27 ENCOUNTER — Other Ambulatory Visit: Payer: Self-pay

## 2023-05-27 ENCOUNTER — Inpatient Hospital Stay: Payer: Medicare Other | Attending: Hematology & Oncology

## 2023-05-27 ENCOUNTER — Inpatient Hospital Stay (HOSPITAL_BASED_OUTPATIENT_CLINIC_OR_DEPARTMENT_OTHER): Payer: Medicare Other | Admitting: Hematology & Oncology

## 2023-05-27 DIAGNOSIS — I2699 Other pulmonary embolism without acute cor pulmonale: Secondary | ICD-10-CM | POA: Diagnosis not present

## 2023-05-27 DIAGNOSIS — Z7901 Long term (current) use of anticoagulants: Secondary | ICD-10-CM | POA: Insufficient documentation

## 2023-05-27 DIAGNOSIS — Z803 Family history of malignant neoplasm of breast: Secondary | ICD-10-CM | POA: Insufficient documentation

## 2023-05-27 DIAGNOSIS — Z86711 Personal history of pulmonary embolism: Secondary | ICD-10-CM | POA: Insufficient documentation

## 2023-05-27 LAB — CBC WITH DIFFERENTIAL (CANCER CENTER ONLY)
Abs Immature Granulocytes: 0.01 10*3/uL (ref 0.00–0.07)
Basophils Absolute: 0 10*3/uL (ref 0.0–0.1)
Basophils Relative: 1 %
Eosinophils Absolute: 0.1 10*3/uL (ref 0.0–0.5)
Eosinophils Relative: 2 %
HCT: 43.7 % (ref 36.0–46.0)
Hemoglobin: 13.8 g/dL (ref 12.0–15.0)
Immature Granulocytes: 0 %
Lymphocytes Relative: 28 %
Lymphs Abs: 1.7 10*3/uL (ref 0.7–4.0)
MCH: 25.7 pg — ABNORMAL LOW (ref 26.0–34.0)
MCHC: 31.6 g/dL (ref 30.0–36.0)
MCV: 81.4 fL (ref 80.0–100.0)
Monocytes Absolute: 0.4 10*3/uL (ref 0.1–1.0)
Monocytes Relative: 7 %
Neutro Abs: 3.7 10*3/uL (ref 1.7–7.7)
Neutrophils Relative %: 62 %
Platelet Count: 235 10*3/uL (ref 150–400)
RBC: 5.37 MIL/uL — ABNORMAL HIGH (ref 3.87–5.11)
RDW: 13.2 % (ref 11.5–15.5)
WBC Count: 5.9 10*3/uL (ref 4.0–10.5)
nRBC: 0 % (ref 0.0–0.2)

## 2023-05-27 LAB — CMP (CANCER CENTER ONLY)
ALT: 11 U/L (ref 0–44)
AST: 18 U/L (ref 15–41)
Albumin: 4.4 g/dL (ref 3.5–5.0)
Alkaline Phosphatase: 67 U/L (ref 38–126)
Anion gap: 7 (ref 5–15)
BUN: 15 mg/dL (ref 8–23)
CO2: 29 mmol/L (ref 22–32)
Calcium: 9.7 mg/dL (ref 8.9–10.3)
Chloride: 108 mmol/L (ref 98–111)
Creatinine: 1.05 mg/dL — ABNORMAL HIGH (ref 0.44–1.00)
GFR, Estimated: 55 mL/min — ABNORMAL LOW (ref >60)
Glucose, Bld: 123 mg/dL — ABNORMAL HIGH (ref 70–99)
Potassium: 4.2 mmol/L (ref 3.5–5.1)
Sodium: 144 mmol/L (ref 135–145)
Total Bilirubin: 0.3 mg/dL (ref 0.0–1.2)
Total Protein: 7 g/dL (ref 6.5–8.1)

## 2023-05-27 LAB — D-DIMER, QUANTITATIVE: D-Dimer, Quant: <0.27 ug{FEU}/mL (ref 0.00–0.50)

## 2023-05-27 MED ORDER — RIVAROXABAN 10 MG PO TABS: 10.0000 mg | ORAL_TABLET | Freq: Every day | ORAL | 12 refills | Status: AC

## 2023-05-27 NOTE — Progress Notes (Signed)
 Hematology and Oncology Follow Up Visit  Monica Kane [id number] 01/19/1946 78 y.o. 05/27/2023   Principle Diagnosis:  Pulmonary embolism-bilateral-idiopathic  Current Therapy:   Xarelto  20 mg p.o. daily-started on 05/24/2021 -changed to 10 mg p.o. daily on 05/27/2023     Interim History:  Monica Kane is back for follow-up.  She is doing quite well.  We last saw her back in April.  Since then, she has had no problems.  She was in [place]  recently.  She is there was some family.  She had a good time.  She will now be switched to maintenance Xarelto .  She has been on therapeutic Xarelto  for 2 years.  I think that she probably needs to have maintenance Xarelto  right now.  She had the idiopathic pulmonary emboli.  She had a colonoscopy that was done in December.  Everything looked out okay.  Apparently, last year in January she had upper endoscopy.  She does not have a well-differentiated carcinoid in the duodenum.  She does have pancreatic cysts being followed by GI.  She has had no bleeding.  Thankfully, there is been no problems with COVID.  Overall, I would say that her performance status is probably ECOG 0.    Medications:  Current Outpatient Medications:    alendronate  (FOSAMAX ) 70 MG tablet, Take 1 tablet (70 mg total) by mouth every 7 (seven) days. Take with a full glass of water on an empty stomach., Disp: 12 tablet, Rfl: 3   amitriptyline  (ELAVIL ) 25 MG tablet, Take 1 tablet (25 mg total) by mouth at bedtime., Disp: 90 tablet, Rfl: 3   carvedilol  (COREG ) 3.125 MG tablet, TAKE ONE TABLET BY MOUTH TWICE DAILY WITH A MEAL (Patient taking differently: Take 3.125 mg by mouth at bedtime.), Disp: 180 tablet, Rfl: 1   lisinopril  (ZESTRIL ) 40 MG tablet, TAKE ONE TABLET BY MOUTH DAILY, Disp: 90 tablet, Rfl: 3   metFORMIN  (GLUCOPHAGE ) 500 MG tablet, TAKE ONE TABLET BY MOUTH DAILY. MAY START WITH 1/2 TAB AND INCREASE AS TOLERATED, Disp: 90 tablet, Rfl: 1   omeprazole  (PRILOSEC) 20 MG  capsule, Take 1 capsule (20 mg total) by mouth 2 (two) times daily before a meal. Twice daily for 1 month then may go back to once daily., Disp: 60 capsule, Rfl: 6   polycarbophil (FIBERCON) 625 MG tablet, Take 625 mg by mouth daily., Disp: , Rfl:    rivaroxaban  (XARELTO ) 20 MG TABS tablet, Take 1 tablet (20 mg total) by mouth daily with supper., Disp: 90 tablet, Rfl: 1   rosuvastatin  (CRESTOR ) 10 MG tablet, Take 1 tablet (10 mg total) by mouth at bedtime., Disp: 90 tablet, Rfl: 1   sertraline  (ZOLOFT ) 100 MG tablet, TAKE 2 & 1/2 TABLETS DAILY., Disp: 225 tablet, Rfl: 3   traZODone  (DESYREL ) 50 MG tablet, Take 1-2 tablets (50-100 mg total) by mouth at bedtime., Disp: 180 tablet, Rfl: 1  Allergies:  Allergies  Allergen Reactions   Penicillins Swelling and Rash    Facial swelling    Past Medical History, Surgical history, Social history, and Family History were reviewed and updated.  Review of Systems: Review of Systems  Constitutional: Negative.   HENT:  Negative.    Eyes: Negative.   Respiratory: Negative.    Cardiovascular: Negative.   Gastrointestinal:  Positive for abdominal distention and abdominal pain. Negative for blood in stool, constipation, diarrhea, nausea and vomiting.  Endocrine: Negative.   Genitourinary: Negative.    Musculoskeletal: Negative.   Skin: Negative.   Neurological: Negative.  Hematological: Negative.   Psychiatric/Behavioral: Negative.      Physical Exam:  height is 5' 5 (1.651 m) and weight is 154 lb (69.9 kg). Her oral temperature is 98.1 F (36.7 C). Her blood pressure is 149/81 (abnormal) and her pulse is 66. Her respiration is 16 and oxygen saturation is 98%.   Wt Readings from Last 3 Encounters:  05/27/23 154 lb (69.9 kg)  03/02/23 153 lb (69.4 kg)  02/25/23 158 lb (71.7 kg)    Physical Exam Vitals reviewed.  HENT:     Head: Normocephalic and atraumatic.  Eyes:     Pupils: Pupils are equal, round, and reactive to light.   Cardiovascular:     Rate and Rhythm: Normal rate and regular rhythm.     Heart sounds: Normal heart sounds.  Pulmonary:     Effort: Pulmonary effort is normal.     Breath sounds: Normal breath sounds.  Abdominal:     General: There is distension.     Palpations: There is no mass.     Tenderness: There is no abdominal tenderness. There is no guarding.     Comments: Hypoactive bowel sounds  Musculoskeletal:        General: No tenderness or deformity. Normal range of motion.     Cervical back: Normal range of motion.  Lymphadenopathy:     Cervical: No cervical adenopathy.  Skin:    General: Skin is warm and dry.     Findings: No erythema or rash.  Neurological:     Mental Status: She is alert and oriented to person, place, and time.  Psychiatric:        Behavior: Behavior normal.        Thought Content: Thought content normal.        Judgment: Judgment normal.      Lab Results  Component Value Date   WBC 5.9 05/27/2023   HGB 13.8 05/27/2023   HCT 43.7 05/27/2023   MCV 81.4 05/27/2023   PLT 235 05/27/2023     Chemistry      Component Value Date/Time   NA 144 05/27/2023 0901   K 4.2 05/27/2023 0901   CL 108 05/27/2023 0901   CO2 29 05/27/2023 0901   BUN 15 05/27/2023 0901   CREATININE 1.05 (H) 05/27/2023 0901   CREATININE 0.81 08/12/2016 1348      Component Value Date/Time   CALCIUM  9.7 05/27/2023 0901   ALKPHOS 67 05/27/2023 0901   AST 18 05/27/2023 0901   ALT 11 05/27/2023 0901   BILITOT 0.3 05/27/2023 0901      Impression and Plan: Monica Kane is a very nice 78 year old white female.  She had bilateral pulmonary emboli.  This is idiopathic from all of her studies to date.   We will now move her onto maintenance Xarelto .  I think she will do well on maintenance Xarelto .  I probably would keep her on this for a good year or so.  I hate that she has had the GI issues.  It sounds like the carcinoid is not going to be a problem for her.  We will go ahead and  plan to get her back in 6 months.  Forgot to mention that her younger sister recently had breast cancer surgery at [facility].  It sounds like everything was localized.     [name], MD 2/7/202510:00 AM

## 2023-06-03 ENCOUNTER — Other Ambulatory Visit: Payer: Self-pay | Admitting: Family Medicine

## 2023-06-03 DIAGNOSIS — I10 Essential (primary) hypertension: Secondary | ICD-10-CM

## 2023-06-07 DIAGNOSIS — M25551 Pain in right hip: Secondary | ICD-10-CM | POA: Diagnosis not present

## 2023-06-14 DIAGNOSIS — M6281 Muscle weakness (generalized): Secondary | ICD-10-CM | POA: Diagnosis not present

## 2023-06-14 DIAGNOSIS — M1611 Unilateral primary osteoarthritis, right hip: Secondary | ICD-10-CM | POA: Diagnosis not present

## 2023-06-14 DIAGNOSIS — M25651 Stiffness of right hip, not elsewhere classified: Secondary | ICD-10-CM | POA: Diagnosis not present

## 2023-06-14 DIAGNOSIS — S76011D Strain of muscle, fascia and tendon of right hip, subsequent encounter: Secondary | ICD-10-CM | POA: Diagnosis not present

## 2023-06-27 ENCOUNTER — Other Ambulatory Visit: Payer: Self-pay | Admitting: Family Medicine

## 2023-06-27 DIAGNOSIS — Z1231 Encounter for screening mammogram for malignant neoplasm of breast: Secondary | ICD-10-CM

## 2023-06-30 NOTE — Progress Notes (Signed)
 [facility] [address], [address] [place], [place] [address] [phone]  Date:  07/04/2023   Name:  Monica Kane   DOB:  [birth year]   MRN:  [id number]  PCP:  Pearline Cables, MD    Chief Complaint: Annual Exam (Concerns/ questions: pt would like your advice on allergy meds/A1C due)   History of Present Illness:  Monica Kane is a 78 y.o. very pleasant female patient who presents with the following:  Patient seen today for an annual exam Most recent visit with myself was in July History of hypertension, diabetes, hyperlipidemia, sleep apnea treated with oral appliance, vitamin D deficiency, neuroendocrine tumor of the duodenum diagnosed in 2024 In February 2023 she also tested positive for pulmonary embolism in the setting of acute illness, suspected COVID though she tested negative.   Most recent visit with hematology was in [place] is currently on maintenance Xarelto, 10 mg daily.  Plan for long-term anticoagulation  She is being treated by gastroenterology for history of carcinoid as well as pancreatic cyst.  She had a screening colonoscopy in November Pt notes the plan is to monitor her carcinoid tumor  COVID booster up-to-date- done Can update A1c Shingrix- done, recommend RSV Mammogram can be updated- this is scheduled for this week DEXA scan can be updated this summer  She has noted allergy sx just recently- she does not normally have allergy sx She does not wear contacts  Her younger sister just got dx with breast cancer-it sounds like this was not a genetic related cancer but she will check on this  Never a smoker, occas alcohol She enjoys dancing for exercise She is doing ok on one trazodone 50 mg- at bedtime  She has been on amitriptyline, trazodone, and a slightly higher than normal dose of sertraline (250 mg) for a long time.  She notes her dose of sertraline is high but she has been taking it  successfully for years   Patient Active Problem List   Diagnosis Date Noted   Change in stool caliber 12/17/2022   Constipation 12/17/2022   Distended abdomen 12/17/2022   Change in bowel habits 12/17/2022   Bloating 12/17/2022   Neuroendocrine tumor 12/17/2022   Pancreatic cyst 03/31/2022   Elevated CA 19-9 level 03/31/2022   Family history of malignant neoplasm of pancreas 03/31/2022   Hx of adenomatous colonic polyps 03/31/2022   Chronic anticoagulation 03/31/2022   Pulmonary embolism (HCC) 07/08/2021   Acquired claw toe, right    Achilles tendon contracture, right    OSA (obstructive sleep apnea) 07/26/2019   Vertigo 10/03/2018   Visual blurriness 10/03/2018   Controlled type 2 diabetes mellitus without complication, without long-term current use of insulin (HCC) 06/05/2015   Primary localized osteoarthritis of right knee    Anxiety    Chest pain, midsternal 07/23/2013   Osteopenia 03/23/2013   Kidney stones, calcium oxalate 03/23/2013   Hypertension 03/31/2011   Hyperlipidemia 03/31/2011   Vitamin D deficiency 03/31/2011   Hallux rigidus 03/24/2011   Metatarsalgia of left foot 03/24/2011   Lateral epicondylitis of left elbow 02/24/2011   Migraine 02/24/2011   Depression 02/24/2011   Arthritis 02/24/2011    Past Medical History:  Diagnosis Date   Allergy    Rash, some swelling on face   Anxiety    takes Ativan nightly   Depression    takes Zoloft daily   GERD (gastroesophageal reflux disease) March 2024  History of colon polyps    History of kidney stones    History of staph infection 11/2013   Hyperlipidemia    takes Crestor nightly    Hypertension    takes Lisinopril and Coreg daily   Joint pain    Joint swelling    Neuropathy    takes Elavil nightly   Pneumonia    Pre-diabetes    Primary localized osteoarthritis of right knee    Pulmonary embolism (HCC) 07/08/2021   Sleep apnea    uses oral appliance    Past Surgical History:  Procedure  Laterality Date   ABDOMINAL HYSTERECTOMY  04/20/1987   APPENDECTOMY     BIOPSY  05/13/2022   Procedure: BIOPSY;  Surgeon: Lemar Lofty., MD;  Location: [facility];  Service: Gastroenterology;;   BREAST BIOPSY Left 02/16/2016   BREAST EXCISIONAL BIOPSY Right 2008   CARDIAC CATHETERIZATION  04/19/2013   CERVICAL SPINE SURGERY  04/19/2002   C 5-6   COLONOSCOPY     colonscopy     DIAGNOSTIC LAPAROSCOPY  04/19/1974   fibroid removed   ELBOW ARTHROSCOPY  2010, 2012   ESOPHAGOGASTRODUODENOSCOPY (EGD) WITH PROPOFOL N/A 05/13/2022   Procedure: ESOPHAGOGASTRODUODENOSCOPY (EGD) WITH PROPOFOL;  Surgeon: Lemar Lofty., MD;  Location: [facility];  Service: Gastroenterology;  Laterality: N/A;   ESOPHAGOGASTRODUODENOSCOPY (EGD) WITH PROPOFOL N/A 07/15/2022   Procedure: ESOPHAGOGASTRODUODENOSCOPY (EGD) WITH PROPOFOL;  Surgeon: Meridee Score Netty Starring., MD;  Location: [facility];  Service: Gastroenterology;  Laterality: N/A;   EUS N/A 07/15/2022   Procedure: UPPER ENDOSCOPIC ULTRASOUND (EUS) RADIAL;  Surgeon: Lemar Lofty., MD;  Location: [facility];  Service: Gastroenterology;  Laterality: N/A;   EYE SURGERY Bilateral    cataract   FINE NEEDLE ASPIRATION N/A 05/13/2022   Procedure: FINE NEEDLE ASPIRATION (FNA) LINEAR;  Surgeon: Lemar Lofty., MD;  Location: [facility];  Service: Gastroenterology;  Laterality: N/A;   FOOT SURGERY Left 04/20/2011   GASTROCNEMIUS RECESSION Right 10/09/2019   Procedure: RIGHT GASTROCNEMIUS RECESSION;  Surgeon: Nadara Mustard, MD;  Location: [facility];  Service: Orthopedics;  Laterality: Right;   HEMOSTASIS CLIP PLACEMENT  07/15/2022   Procedure: HEMOSTASIS CLIP PLACEMENT;  Surgeon: Lemar Lofty., MD;  Location: [facility];  Service: Gastroenterology;;   HYSTEROTOMY  1989   JOINT REPLACEMENT  01/2014   Right knee   KNEE ARTHROSCOPY  01/17/2013   KNEE ARTHROSCOPY W/ MENISCECTOMY Right 01/31/2013    [facility]   LATERAL EPICONDYLE RELEASE  03/01/2011   Procedure: TENNIS ELBOW RELEASE;  Surgeon: Nilda Simmer, MD;  Location: [facility];  Service: Orthopedics;  Laterality: Left;  TENOTOMY ELBOW LATERAL EPICONDYLITIS TENNIS ELBOW   lateral release right elbow  04/19/2008   left foot surgery  04/20/2011   mortons neuroma   LEFT HEART CATHETERIZATION WITH CORONARY ANGIOGRAM N/A 07/23/2013   Procedure: LEFT HEART CATHETERIZATION WITH CORONARY ANGIOGRAM;  Surgeon: Lesleigh Noe, MD;  Location: [facility];  Service: Cardiovascular;  Laterality: N/A;   PAROTID GLAND TUMOR EXCISION  04/19/1974   benign   PAROTID GLAND TUMOR EXCISION  04/19/1974   rt   SHOULDER ARTHROSCOPY DISTAL CLAVICLE EXCISION AND OPEN ROTATOR CUFF REPAIR  04/19/2004   SHOULDER ARTHROSCOPY W/ ROTATOR CUFF REPAIR  04/19/2004   SUBMUCOSAL LIFTING INJECTION  07/15/2022   Procedure: SUBMUCOSAL LIFTING INJECTION;  Surgeon: Lemar Lofty., MD;  Location: [facility];  Service: Gastroenterology;;   TOTAL KNEE ARTHROPLASTY Right 02/18/2014   Procedure: TOTAL KNEE ARTHROPLASTY;  Surgeon:  Nilda Simmer, MD;  [facility];  Service: Orthopedics;  Laterality: Right;   UPPER ESOPHAGEAL ENDOSCOPIC ULTRASOUND (EUS) N/A 05/13/2022   Procedure: UPPER ESOPHAGEAL ENDOSCOPIC ULTRASOUND (EUS);  Surgeon: Lemar Lofty., MD;  Location: [facility];  Service: Gastroenterology;  Laterality: N/A;   WEIL OSTEOTOMY Right 10/09/2019   Procedure: WEIL OSTEOTOMY RIGHT 2ND METATARSAL;  Surgeon: Nadara Mustard, MD;  Location: [facility];  Service: Orthopedics;  Laterality: Right;    Social History   Tobacco Use   Smoking status: Never   Smokeless tobacco: Never  Vaping Use   Vaping status: Never Used  Substance Use Topics   Alcohol use: Yes    Comment: Occasionally on weekends   Drug use: Never    Family History  Problem Relation Age of Onset   Cancer Mother        lung   Diabetes  Mother    Heart disease Mother    Hypertension Mother    Kidney disease Mother    Cancer Father        lung; + tobacco   Alcohol abuse Father    Kidney disease Sister    Colon polyps Sister    Diabetes Sister    Hypertension Sister    Hyperlipidemia Sister    Kidney failure Sister    Cancer Sister    Depression Sister    Pancreatic cancer Maternal Aunt    Heart disease Maternal Grandfather        heart attack   Heart disease Paternal Grandfather        heart attack   Colon cancer Nephew    Breast cancer Neg Hx    Esophageal cancer Neg Hx    Inflammatory bowel disease Neg Hx    Liver disease Neg Hx    Rectal cancer Neg Hx    Stomach cancer Neg Hx     Allergies  Allergen Reactions   Penicillins Swelling and Rash    Facial swelling    Medication list has been reviewed and updated.  Current Outpatient Medications on File Prior to Visit  Medication Sig Dispense Refill   alendronate (FOSAMAX) 70 MG tablet Take 1 tablet (70 mg total) by mouth every 7 (seven) days. Take with a full glass of water on an empty stomach. 12 tablet 3   amitriptyline (ELAVIL) 25 MG tablet Take 1 tablet (25 mg total) by mouth at bedtime. 90 tablet 3   carvedilol (COREG) 3.125 MG tablet TAKE ONE TABLET BY MOUTH TWICE DAILY WITH A MEAL (Patient taking differently: Take 3.125 mg by mouth at bedtime.) 180 tablet 1   lisinopril (ZESTRIL) 40 MG tablet Take 1 tablet (40 mg total) by mouth daily. 30 tablet 0   metFORMIN (GLUCOPHAGE) 500 MG tablet TAKE ONE TABLET BY MOUTH DAILY. MAY START WITH 1/2 TAB AND INCREASE AS TOLERATED 90 tablet 1   omeprazole (PRILOSEC) 20 MG capsule Take 1 capsule (20 mg total) by mouth 2 (two) times daily before a meal. Twice daily for 1 month then may go back to once daily. 60 capsule 6   polycarbophil (FIBERCON) 625 MG tablet Take 625 mg by mouth daily.     rivaroxaban (XARELTO) 10 MG TABS tablet Take 1 tablet (10 mg total) by mouth daily with supper. 30 tablet 12   rosuvastatin  (CRESTOR) 10 MG tablet Take 1 tablet (10 mg total) by mouth at bedtime. 90 tablet 1   sertraline (ZOLOFT) 100 MG tablet TAKE 2 & 1/2 TABLETS DAILY.  225 tablet 3   traZODone (DESYREL) 50 MG tablet Take 1-2 tablets (50-100 mg total) by mouth at bedtime. 180 tablet 1   No current facility-administered medications on file prior to visit.    Review of Systems:  As per HPI- otherwise negative.   Physical Examination: Vitals:   07/04/23 1509  BP: 134/84  Pulse: 67  Resp: 18  Temp: 97.7 F (36.5 C)  SpO2: 97%   Vitals:   07/04/23 1509  Weight: 155 lb 6.4 oz (70.5 kg)  Height: 5\' 5"  (1.651 m)   Body mass index is 25.86 kg/m. Ideal Body Weight: Weight in (lb) to have BMI = 25: 149.9  GEN: no acute distress.  Normal weight, looks well HEENT: Atraumatic, Normocephalic.  Ears and Nose: No external deformity. CV: RRR, No M/G/R. No JVD. No thrill. No extra heart sounds. PULM: CTA B, no wheezes, crackles, rhonchi. No retractions. No resp. distress. No accessory muscle use. ABD: S, NT, ND, +BS. No rebound. No HSM. EXTR: No c/c/e PSYCH: Normally interactive. Conversant.    Assessment and Plan: Controlled type 2 diabetes mellitus without complication, without long-term current use of insulin (HCC) - Plan: Hemoglobin A1c, metFORMIN (GLUCOPHAGE) 500 MG tablet  Essential hypertension - Plan: lisinopril (ZESTRIL) 40 MG tablet  Thyroid disorder screening - Plan: TSH  Screening, lipid - Plan: Lipid panel  Estrogen deficiency - Plan: DG Bone Density  Hereditary and idiopathic peripheral neuropathy - Plan: amitriptyline (ELAVIL) 25 MG tablet  Hyperlipidemia, unspecified hyperlipidemia type - Plan: rosuvastatin (CRESTOR) 10 MG tablet  Moderate single current episode of major depressive disorder (HCC) - Plan: sertraline (ZOLOFT) 100 MG tablet  Insomnia, unspecified type - Plan: traZODone (DESYREL) 50 MG tablet  Patient seen today for follow-up.  Will monitor diabetes, refilled her  metformin Blood pressure under good control Follow-up on lipids, refilled Crestor Insomnia is treated with trazodone Using sertraline and amitriptyline for depression with success Will plan further follow- up pending labs.   Signed Abbe Amsterdam, MD  Received her labs 3/18- message to pt  Results for orders placed or performed in visit on 07/04/23  Hemoglobin A1c   Collection Time: 07/04/23  3:48 PM  Result Value Ref Range   Hgb A1c MFr Bld 7.3 (H) 4.6 - 6.5 %  Lipid panel   Collection Time: 07/04/23  3:48 PM  Result Value Ref Range   Cholesterol 175 0 - 200 mg/dL   Triglycerides 161.0 0.0 - 149.0 mg/dL   HDL 96.04 >54.09 mg/dL   VLDL 81.1 0.0 - 91.4 mg/dL   LDL Cholesterol 84 0 - 99 mg/dL   Total CHOL/HDL Ratio 3    NonHDL 108.44   TSH   Collection Time: 07/04/23  3:48 PM  Result Value Ref Range   TSH 1.31 0.35 - 5.50 uIU/mL

## 2023-07-04 ENCOUNTER — Ambulatory Visit (INDEPENDENT_AMBULATORY_CARE_PROVIDER_SITE_OTHER): Admitting: Family Medicine

## 2023-07-04 VITALS — BP 134/84 | HR 67 | Temp 97.7°F | Resp 18 | Ht 65.0 in | Wt 155.4 lb

## 2023-07-04 DIAGNOSIS — G609 Hereditary and idiopathic neuropathy, unspecified: Secondary | ICD-10-CM

## 2023-07-04 DIAGNOSIS — G4700 Insomnia, unspecified: Secondary | ICD-10-CM | POA: Diagnosis not present

## 2023-07-04 DIAGNOSIS — E785 Hyperlipidemia, unspecified: Secondary | ICD-10-CM | POA: Diagnosis not present

## 2023-07-04 DIAGNOSIS — F321 Major depressive disorder, single episode, moderate: Secondary | ICD-10-CM | POA: Diagnosis not present

## 2023-07-04 DIAGNOSIS — I10 Essential (primary) hypertension: Secondary | ICD-10-CM

## 2023-07-04 DIAGNOSIS — Z1329 Encounter for screening for other suspected endocrine disorder: Secondary | ICD-10-CM | POA: Diagnosis not present

## 2023-07-04 DIAGNOSIS — E2839 Other primary ovarian failure: Secondary | ICD-10-CM

## 2023-07-04 DIAGNOSIS — Z13220 Encounter for screening for lipoid disorders: Secondary | ICD-10-CM | POA: Diagnosis not present

## 2023-07-04 DIAGNOSIS — Z7984 Long term (current) use of oral hypoglycemic drugs: Secondary | ICD-10-CM

## 2023-07-04 DIAGNOSIS — E119 Type 2 diabetes mellitus without complications: Secondary | ICD-10-CM

## 2023-07-04 MED ORDER — SERTRALINE HCL 100 MG PO TABS: 250.0000 mg | ORAL_TABLET | Freq: Every day | ORAL | 3 refills | Status: DC

## 2023-07-04 MED ORDER — AMITRIPTYLINE HCL 25 MG PO TABS: 25.0000 mg | ORAL_TABLET | Freq: Every day | ORAL | 3 refills | Status: DC

## 2023-07-04 MED ORDER — METFORMIN HCL 500 MG PO TABS: 500.0000 mg | ORAL_TABLET | Freq: Every day | ORAL | 3 refills | Status: AC

## 2023-07-04 MED ORDER — ROSUVASTATIN CALCIUM 10 MG PO TABS: 10.0000 mg | ORAL_TABLET | Freq: Every evening | ORAL | 3 refills | Status: AC

## 2023-07-04 MED ORDER — TRAZODONE HCL 50 MG PO TABS: 50.0000 mg | ORAL_TABLET | Freq: Every day | ORAL | 1 refills | Status: AC

## 2023-07-04 MED ORDER — LISINOPRIL 40 MG PO TABS: 40.0000 mg | ORAL_TABLET | Freq: Every day | ORAL | 3 refills | Status: AC

## 2023-07-04 NOTE — Patient Instructions (Addendum)
 You might try Pataday or similar drops for eye allergy symptoms Recommend a dose of RSV vaccine at your pharmacy I will be in touch iwht your labs asap  Assuming all is well please see me in about 6 months

## 2023-07-05 ENCOUNTER — Encounter: Payer: Self-pay | Admitting: Family Medicine

## 2023-07-05 LAB — LIPID PANEL
Cholesterol: 175 mg/dL (ref 0–200)
HDL: 66.1 mg/dL (ref >39.00)
LDL Cholesterol: 84 mg/dL (ref 0–99)
NonHDL: 108.44
Total CHOL/HDL Ratio: 3
Triglycerides: 121 mg/dL (ref 0.0–149.0)
VLDL: 24.2 mg/dL (ref 0.0–40.0)

## 2023-07-05 LAB — HEMOGLOBIN A1C: Hgb A1c MFr Bld: 7.3 % — ABNORMAL HIGH (ref 4.6–6.5)

## 2023-07-05 LAB — TSH: TSH: 1.31 u[IU]/mL (ref 0.35–5.50)

## 2023-07-06 ENCOUNTER — Ambulatory Visit
Admission: RE | Admit: 2023-07-06 | Discharge: 2023-07-06 | Disposition: A | Discharge status: Home / Self Care | Source: Ambulatory Visit | Attending: Family Medicine | Admitting: Family Medicine

## 2023-07-06 DIAGNOSIS — Z1231 Encounter for screening mammogram for malignant neoplasm of breast: Secondary | ICD-10-CM | POA: Diagnosis not present

## 2023-07-12 ENCOUNTER — Encounter: Payer: Self-pay | Admitting: Family Medicine

## 2023-07-12 DIAGNOSIS — S76011D Strain of muscle, fascia and tendon of right hip, subsequent encounter: Secondary | ICD-10-CM | POA: Diagnosis not present

## 2023-07-12 DIAGNOSIS — M6281 Muscle weakness (generalized): Secondary | ICD-10-CM | POA: Diagnosis not present

## 2023-07-12 DIAGNOSIS — M25651 Stiffness of right hip, not elsewhere classified: Secondary | ICD-10-CM | POA: Diagnosis not present

## 2023-07-12 DIAGNOSIS — M1611 Unilateral primary osteoarthritis, right hip: Secondary | ICD-10-CM | POA: Diagnosis not present

## 2023-07-12 DIAGNOSIS — H109 Unspecified conjunctivitis: Secondary | ICD-10-CM

## 2023-07-12 MED ORDER — POLYMYXIN B-TRIMETHOPRIM 10000-0.1 UNIT/ML-% OP SOLN: 1.0000 [drp] | OPHTHALMIC | 0 refills | Status: DC

## 2023-07-14 DIAGNOSIS — S76011D Strain of muscle, fascia and tendon of right hip, subsequent encounter: Secondary | ICD-10-CM | POA: Diagnosis not present

## 2023-07-14 DIAGNOSIS — M25651 Stiffness of right hip, not elsewhere classified: Secondary | ICD-10-CM | POA: Diagnosis not present

## 2023-07-14 DIAGNOSIS — M1611 Unilateral primary osteoarthritis, right hip: Secondary | ICD-10-CM | POA: Diagnosis not present

## 2023-07-14 DIAGNOSIS — M6281 Muscle weakness (generalized): Secondary | ICD-10-CM | POA: Diagnosis not present

## 2023-07-19 DIAGNOSIS — M1611 Unilateral primary osteoarthritis, right hip: Secondary | ICD-10-CM | POA: Diagnosis not present

## 2023-07-19 DIAGNOSIS — M25651 Stiffness of right hip, not elsewhere classified: Secondary | ICD-10-CM | POA: Diagnosis not present

## 2023-07-19 DIAGNOSIS — M6281 Muscle weakness (generalized): Secondary | ICD-10-CM | POA: Diagnosis not present

## 2023-07-19 DIAGNOSIS — S76011D Strain of muscle, fascia and tendon of right hip, subsequent encounter: Secondary | ICD-10-CM | POA: Diagnosis not present

## 2023-07-22 ENCOUNTER — Telehealth (HOSPITAL_BASED_OUTPATIENT_CLINIC_OR_DEPARTMENT_OTHER): Payer: Self-pay

## 2023-07-26 DIAGNOSIS — M25651 Stiffness of right hip, not elsewhere classified: Secondary | ICD-10-CM | POA: Diagnosis not present

## 2023-07-26 DIAGNOSIS — M1611 Unilateral primary osteoarthritis, right hip: Secondary | ICD-10-CM | POA: Diagnosis not present

## 2023-07-26 DIAGNOSIS — M6281 Muscle weakness (generalized): Secondary | ICD-10-CM | POA: Diagnosis not present

## 2023-07-26 DIAGNOSIS — S76011D Strain of muscle, fascia and tendon of right hip, subsequent encounter: Secondary | ICD-10-CM | POA: Diagnosis not present

## 2023-07-27 DIAGNOSIS — H5213 Myopia, bilateral: Secondary | ICD-10-CM | POA: Diagnosis not present

## 2023-07-27 DIAGNOSIS — Z961 Presence of intraocular lens: Secondary | ICD-10-CM | POA: Diagnosis not present

## 2023-07-27 DIAGNOSIS — E119 Type 2 diabetes mellitus without complications: Secondary | ICD-10-CM | POA: Diagnosis not present

## 2023-07-27 LAB — HM DIABETES EYE EXAM

## 2023-07-28 DIAGNOSIS — M1611 Unilateral primary osteoarthritis, right hip: Secondary | ICD-10-CM | POA: Diagnosis not present

## 2023-07-28 DIAGNOSIS — M6281 Muscle weakness (generalized): Secondary | ICD-10-CM | POA: Diagnosis not present

## 2023-07-28 DIAGNOSIS — M25651 Stiffness of right hip, not elsewhere classified: Secondary | ICD-10-CM | POA: Diagnosis not present

## 2023-07-28 DIAGNOSIS — S76011D Strain of muscle, fascia and tendon of right hip, subsequent encounter: Secondary | ICD-10-CM | POA: Diagnosis not present

## 2023-08-09 ENCOUNTER — Encounter (INDEPENDENT_AMBULATORY_CARE_PROVIDER_SITE_OTHER): Payer: Self-pay | Admitting: Family Medicine

## 2023-08-09 DIAGNOSIS — M1611 Unilateral primary osteoarthritis, right hip: Secondary | ICD-10-CM | POA: Diagnosis not present

## 2023-08-09 DIAGNOSIS — M2550 Pain in unspecified joint: Secondary | ICD-10-CM | POA: Diagnosis not present

## 2023-08-11 ENCOUNTER — Other Ambulatory Visit (INDEPENDENT_AMBULATORY_CARE_PROVIDER_SITE_OTHER)

## 2023-08-11 DIAGNOSIS — M2550 Pain in unspecified joint: Secondary | ICD-10-CM

## 2023-08-12 LAB — C-REACTIVE PROTEIN: CRP: <1 mg/dL (ref 0.5–20.0)

## 2023-08-12 LAB — SEDIMENTATION RATE: Sed Rate: 1 mm/h (ref 0–30)

## 2023-08-13 LAB — CYCLIC CITRUL PEPTIDE ANTIBODY, IGG: Cyclic Citrullin Peptide Ab: <16 U

## 2023-08-13 LAB — RHEUMATOID FACTOR: Rheumatoid fact SerPl-aCnc: <10 [IU]/mL (ref <14)

## 2023-08-13 NOTE — Telephone Encounter (Signed)

## 2023-09-05 ENCOUNTER — Other Ambulatory Visit: Payer: Self-pay | Admitting: Surgery

## 2023-09-05 DIAGNOSIS — D490 Neoplasm of unspecified behavior of digestive system: Secondary | ICD-10-CM

## 2023-09-19 DIAGNOSIS — D224 Melanocytic nevi of scalp and neck: Secondary | ICD-10-CM | POA: Diagnosis not present

## 2023-09-19 DIAGNOSIS — L570 Actinic keratosis: Secondary | ICD-10-CM | POA: Diagnosis not present

## 2023-09-19 DIAGNOSIS — D2230 Melanocytic nevi of unspecified part of face: Secondary | ICD-10-CM | POA: Diagnosis not present

## 2023-09-19 DIAGNOSIS — D2260 Melanocytic nevi of unspecified upper limb, including shoulder: Secondary | ICD-10-CM | POA: Diagnosis not present

## 2023-09-19 DIAGNOSIS — D225 Melanocytic nevi of trunk: Secondary | ICD-10-CM | POA: Diagnosis not present

## 2023-09-26 ENCOUNTER — Encounter: Payer: Self-pay | Admitting: Gastroenterology

## 2023-09-27 NOTE — Telephone Encounter (Signed)
 We can do the course of Bactrim  (that she has from the last year) if she likes. If she felt that Xifaxan  was more effective, although I know that financially can be more expensive, we could do that treatment. Let me know what she decides. GM

## 2023-09-29 ENCOUNTER — Telehealth: Payer: Self-pay

## 2023-09-29 ENCOUNTER — Other Ambulatory Visit (HOSPITAL_COMMUNITY): Payer: Self-pay

## 2023-09-29 NOTE — Telephone Encounter (Signed)
 Pharmacy Patient Advocate Encounter   Received notification from CoverMyMeds that prior authorization for Sertraline  HCl 100MG  tablets  is required/requested.   Insurance verification completed.   The patient is insured through [facility] MEDICARE .   Per test claim: PA required; PA started via CoverMyMeds. KEY [id number] . Waiting for clinical questions to populate.

## 2023-09-30 NOTE — Telephone Encounter (Signed)
 PLEASE BE ADVISED Clinical questions have been answered and PA submitted.TO PLAN. PA currently Pending.

## 2023-09-30 NOTE — Telephone Encounter (Signed)
 Copied from CRM [id number]. Topic: General - Other >> Sep 30, 2023  1:19 PM Howard Macho wrote: Reason for CRM: sh[place] from [employer] called stating the patient was approved for the medication setraline 100mg  tablet for one year CB [phone] option 5

## 2023-10-04 ENCOUNTER — Other Ambulatory Visit

## 2023-10-04 DIAGNOSIS — D490 Neoplasm of unspecified behavior of digestive system: Secondary | ICD-10-CM | POA: Diagnosis not present

## 2023-10-05 DIAGNOSIS — M1611 Unilateral primary osteoarthritis, right hip: Secondary | ICD-10-CM | POA: Diagnosis not present

## 2023-10-08 ENCOUNTER — Ambulatory Visit
Admission: RE | Admit: 2023-10-08 | Discharge: 2023-10-08 | Disposition: A | Discharge status: Home / Self Care | Source: Ambulatory Visit | Attending: Surgery | Admitting: Surgery

## 2023-10-08 DIAGNOSIS — D490 Neoplasm of unspecified behavior of digestive system: Secondary | ICD-10-CM

## 2023-10-08 DIAGNOSIS — K862 Cyst of pancreas: Secondary | ICD-10-CM | POA: Diagnosis not present

## 2023-10-08 MED ORDER — GADOPICLENOL 0.5 MMOL/ML IV SOLN
6.0000 mL | Freq: Once | INTRAVENOUS | Status: AC | PRN
  Administered 2023-10-08: 6 mL via INTRAVENOUS

## 2023-10-10 NOTE — Telephone Encounter (Signed)
 Monica Kane, Since she felt better with Xifaxan , I would go ahead and send in the Xifaxan  at 550 mg 3 times daily x 14 days for retreatment of SIBO.  In regards to the MRI, we will let Dr. Dasie look at it completely but no significant changes have really occurred, with her pancreas is still being present.  Go ahead and schedule her for her upper EUS for neuroendocrine tumor and for pancreatic cyst follow-up, Dr. Dasie and I can discuss potential sampling of the cyst(s).  Schedule this as a 90-minute EUS.  Thanks. GM  FYI SA, we can discuss things further offline.

## 2023-10-11 ENCOUNTER — Other Ambulatory Visit (HOSPITAL_COMMUNITY): Payer: Self-pay

## 2023-10-11 ENCOUNTER — Telehealth: Payer: Self-pay

## 2023-10-11 MED ORDER — RIFAXIMIN 550 MG PO TABS
550.0000 mg | ORAL_TABLET | Freq: Three times a day (TID) | ORAL | 0 refills | Status: AC
Start: 2023-10-11 — End: 2023-10-25

## 2023-10-11 NOTE — Addendum Note (Signed)
 Addended by: ANITRA ODETTA CROME on: 10/11/2023 10:09 AM   Modules accepted: Orders

## 2023-10-11 NOTE — Telephone Encounter (Signed)
 The pt has been advised of the approval

## 2023-10-11 NOTE — Telephone Encounter (Signed)
 Pharmacy Patient Advocate Encounter  Received notification from [facility] [employer] that Prior Authorization for  Xifaxan  550MG  tablets has been APPROVED from 10-11-2023 to 10-10-2024   PA #/Case ID/Reference #: [facility]

## 2023-10-11 NOTE — Telephone Encounter (Signed)
 Pharmacy Patient Advocate Encounter   Received notification from CoverMyMeds that prior authorization for Xifaxan  550MG  tablets is required/requested.   Insurance verification completed.   The patient is insured through [employer] .   Per test claim: PA required; PA submitted to above mentioned insurance via CoverMyMeds Key/confirmation #/EOC [id number] Status is pending

## 2023-10-14 ENCOUNTER — Telehealth: Payer: Self-pay

## 2023-10-14 ENCOUNTER — Other Ambulatory Visit: Payer: Self-pay

## 2023-10-14 DIAGNOSIS — K862 Cyst of pancreas: Secondary | ICD-10-CM

## 2023-10-14 NOTE — Telephone Encounter (Signed)
 EUS has been set up for 12/29/23 at 930 am with GM at [facility]

## 2023-10-14 NOTE — Telephone Encounter (Signed)
 EUS scheduled, pt instructed and medications reviewed.  Patient instructions mailed to home.  Patient to call with any questions or concerns.  Anti coag has been sent to Dr Timmy

## 2023-10-14 NOTE — Telephone Encounter (Signed)
-----   Message from Nurse Jernard Reiber P sent at 10/11/2023 10:09 AM EDT ----- Go ahead and schedule her for her upper EUS for neuroendocrine tumor and for pancreatic cyst follow-up, Dr. Dasie and I can discuss potential sampling of the cyst(s).  Schedule this as a 90-minute EUS.

## 2023-10-17 NOTE — Telephone Encounter (Signed)
 Faxed response received from Dr Timmy with the ok to hold Xarelto  for 2 days prior.  The patient has been notified of this information and all questions answered.

## 2023-10-20 DIAGNOSIS — R7989 Other specified abnormal findings of blood chemistry: Secondary | ICD-10-CM | POA: Diagnosis not present

## 2023-10-20 DIAGNOSIS — D490 Neoplasm of unspecified behavior of digestive system: Secondary | ICD-10-CM | POA: Diagnosis not present

## 2023-10-25 ENCOUNTER — Ambulatory Visit (INDEPENDENT_AMBULATORY_CARE_PROVIDER_SITE_OTHER)

## 2023-10-25 VITALS — Ht 65.0 in | Wt 155.0 lb

## 2023-10-25 DIAGNOSIS — Z0000 Encounter for general adult medical examination without abnormal findings: Secondary | ICD-10-CM | POA: Diagnosis not present

## 2023-10-25 NOTE — Progress Notes (Signed)
 Subjective:   Monica Kane is a 78 y.o. who presents for a Medicare Wellness preventive visit.  As a reminder, Annual Wellness Visits don't include a physical exam, and some assessments may be limited, especially if this visit is performed virtually. We may recommend an in-person follow-up visit with your provider if needed.  Visit Complete: Virtual I connected with  Monica Kane on 10/25/23 by a video and audio enabled telemedicine application and verified that I am speaking with the correct person using two identifiers.  Patient Location: Home  Provider Location: Home Office  I discussed the limitations of evaluation and management by telemedicine. The patient expressed understanding and agreed to proceed.  Vital Signs: Because this visit was a virtual/telehealth visit, some criteria may be missing or patient reported. Any vitals not documented were not able to be obtained and vitals that have been documented are patient reported.  Persons Participating in Visit: Patient.  AWV Questionnaire: Yes: Patient Medicare AWV questionnaire was completed by the patient on 10/18/23; I have confirmed that all information answered by patient is correct and no changes since this date.  Cardiac Risk Factors include: advanced age (>14men, >63 women);diabetes mellitus;dyslipidemia;hypertension     Objective:    Today's Vitals   10/25/23 1301  Weight: 155 lb (70.3 kg)  Height: 5' 5 (1.651 m)   Body mass index is 25.79 kg/m.     10/25/2023    1:30 PM 05/27/2023    9:36 AM 10/20/2022    9:02 AM 07/19/2022    9:22 AM 07/15/2022   10:18 AM 05/13/2022    6:43 AM 03/08/2022    8:13 AM  Advanced Directives  Does Patient Have a Medical Advance Directive? No Yes Yes Yes Yes Yes Yes  Type of Advance Directive  Living will;Healthcare Power of [employer] Power of [place];Living will Healthcare Power of [place];Living will Healthcare Power of [place];Living will Living will;Healthcare Power  of Attorney   Does patient want to make changes to medical advance directive?  No - Patient declined  No - Patient declined     Copy of Healthcare Power of Attorney in Chart?   No - copy requested No - copy requested No - copy requested No - copy requested   Would patient like information on creating a medical advance directive? Yes (MAU/Ambulatory/Procedural Areas - Information given)          Current Medications (verified) Outpatient Encounter Medications as of 10/25/2023  Medication Sig   alendronate  (FOSAMAX ) 70 MG tablet Take 1 tablet (70 mg total) by mouth every 7 (seven) days. Take with a full glass of water on an empty stomach.   amitriptyline  (ELAVIL ) 25 MG tablet Take 1 tablet (25 mg total) by mouth at bedtime.   buPROPion (WELLBUTRIN) 75 MG tablet Take by mouth.   carvedilol  (COREG ) 3.125 MG tablet TAKE ONE TABLET BY MOUTH TWICE DAILY WITH A MEAL (Patient taking differently: Take 3.125 mg by mouth at bedtime.)   lisinopril  (ZESTRIL ) 40 MG tablet Take 1 tablet (40 mg total) by mouth daily.   metFORMIN  (GLUCOPHAGE ) 500 MG tablet Take 1 tablet (500 mg total) by mouth daily.   omeprazole  (PRILOSEC) 20 MG capsule Take 1 capsule (20 mg total) by mouth 2 (two) times daily before a meal. Twice daily for 1 month then may go back to once daily.   polycarbophil (FIBERCON) 625 MG tablet Take 625 mg by mouth daily.   rifaximin  (XIFAXAN ) 550 MG TABS tablet Take 1 tablet (550 mg  total) by mouth 3 (three) times daily for 14 days.   rivaroxaban  (XARELTO ) 10 MG TABS tablet Take 1 tablet (10 mg total) by mouth daily with supper.   rosuvastatin  (CRESTOR ) 10 MG tablet Take 1 tablet (10 mg total) by mouth at bedtime.   sertraline  (ZOLOFT ) 100 MG tablet Take 2.5 tablets (250 mg total) by mouth daily.   traZODone  (DESYREL ) 50 MG tablet Take 1-2 tablets (50-100 mg total) by mouth at bedtime.   trimethoprim -polymyxin b  (POLYTRIM ) ophthalmic solution Place 1 drop into both eyes every 4 (four) hours.   No  facility-administered encounter medications on file as of 10/25/2023.    Allergies (verified) Penicillins   History: Past Medical History:  Diagnosis Date   Allergy    Rash, some swelling on face   Anxiety    takes Ativan nightly   Depression    takes Zoloft  daily   GERD (gastroesophageal reflux disease) March 2024   History of colon polyps    History of kidney stones    History of staph infection 11/2013   Hyperlipidemia    takes Crestor  nightly    Hypertension    takes Lisinopril  and Coreg  daily   Joint pain    Joint swelling    Neuropathy    takes Elavil  nightly   Pneumonia    Pre-diabetes    Primary localized osteoarthritis of right knee    Pulmonary embolism (HCC) 07/08/2021   Sleep apnea    uses oral appliance   Past Surgical History:  Procedure Laterality Date   ABDOMINAL HYSTERECTOMY  04/20/1987   APPENDECTOMY     BIOPSY  05/13/2022   Procedure: BIOPSY;  Surgeon: Wilhelmenia Aloha Raddle., MD;  Location: [facility];  Service: Gastroenterology;;   BREAST BIOPSY Left 02/16/2016   BREAST EXCISIONAL BIOPSY Right 2008   CARDIAC CATHETERIZATION  04/19/2013   CERVICAL SPINE SURGERY  04/19/2002   C 5-6   COLONOSCOPY     colonscopy     DIAGNOSTIC LAPAROSCOPY  04/19/1974   fibroid removed   ELBOW ARTHROSCOPY  2010, 2012   ESOPHAGOGASTRODUODENOSCOPY (EGD) WITH PROPOFOL  N/A 05/13/2022   Procedure: ESOPHAGOGASTRODUODENOSCOPY (EGD) WITH PROPOFOL ;  Surgeon: Wilhelmenia Aloha Raddle., MD;  Location: [facility];  Service: Gastroenterology;  Laterality: N/A;   ESOPHAGOGASTRODUODENOSCOPY (EGD) WITH PROPOFOL  N/A 07/15/2022   Procedure: ESOPHAGOGASTRODUODENOSCOPY (EGD) WITH PROPOFOL ;  Surgeon: Wilhelmenia Aloha Raddle., MD;  Location: [facility];  Service: Gastroenterology;  Laterality: N/A;   EUS N/A 07/15/2022   Procedure: UPPER ENDOSCOPIC ULTRASOUND (EUS) RADIAL;  Surgeon: Wilhelmenia Aloha Raddle., MD;  Location: [facility];  Service: Gastroenterology;  Laterality: N/A;    EYE SURGERY Bilateral    cataract   FINE NEEDLE ASPIRATION N/A 05/13/2022   Procedure: FINE NEEDLE ASPIRATION (FNA) LINEAR;  Surgeon: Wilhelmenia Aloha Raddle., MD;  Location: [facility];  Service: Gastroenterology;  Laterality: N/A;   FOOT SURGERY Left 04/20/2011   GASTROCNEMIUS RECESSION Right 10/09/2019   Procedure: RIGHT GASTROCNEMIUS RECESSION;  Surgeon: Harden Jerona GAILS, MD;  Location: [facility];  Service: Orthopedics;  Laterality: Right;   HEMOSTASIS CLIP PLACEMENT  07/15/2022   Procedure: HEMOSTASIS CLIP PLACEMENT;  Surgeon: Wilhelmenia Aloha Raddle., MD;  Location: [facility];  Service: Gastroenterology;;   HYSTEROTOMY  1989   JOINT REPLACEMENT  01/2014   Right knee   KNEE ARTHROSCOPY  01/17/2013   KNEE ARTHROSCOPY W/ MENISCECTOMY Right 01/31/2013   [place]   LATERAL EPICONDYLE RELEASE  03/01/2011   Procedure: TENNIS ELBOW RELEASE;  Surgeon: Lamar DELENA Millman, MD;  Location:  [facility];  Service: Orthopedics;  Laterality: Left;  TENOTOMY ELBOW LATERAL EPICONDYLITIS TENNIS ELBOW   lateral release right elbow  04/19/2008   left foot surgery  04/20/2011   mortons neuroma   LEFT HEART CATHETERIZATION WITH CORONARY ANGIOGRAM N/A 07/23/2013   Procedure: LEFT HEART CATHETERIZATION WITH CORONARY ANGIOGRAM;  Surgeon: Victory LELON Claudene DOUGLAS, MD;  Location: [facility];  Service: Cardiovascular;  Laterality: N/A;   PAROTID GLAND TUMOR EXCISION  04/19/1974   benign   PAROTID GLAND TUMOR EXCISION  04/19/1974   rt   SHOULDER ARTHROSCOPY DISTAL CLAVICLE EXCISION AND OPEN ROTATOR CUFF REPAIR  04/19/2004   SHOULDER ARTHROSCOPY W/ ROTATOR CUFF REPAIR  04/19/2004   SUBMUCOSAL LIFTING INJECTION  07/15/2022   Procedure: SUBMUCOSAL LIFTING INJECTION;  Surgeon: Wilhelmenia Aloha Raddle., MD;  Location: [facility];  Service: Gastroenterology;;   TOTAL KNEE ARTHROPLASTY Right 02/18/2014   Procedure: TOTAL KNEE ARTHROPLASTY;  Surgeon: Lamar DELENA Millman, MD;  Location: [facility];   Service: Orthopedics;  Laterality: Right;   UPPER ESOPHAGEAL ENDOSCOPIC ULTRASOUND (EUS) N/A 05/13/2022   Procedure: UPPER ESOPHAGEAL ENDOSCOPIC ULTRASOUND (EUS);  Surgeon: Wilhelmenia Aloha Raddle., MD;  Location: [facility];  Service: Gastroenterology;  Laterality: N/A;   WEIL OSTEOTOMY Right 10/09/2019   Procedure: WEIL OSTEOTOMY RIGHT 2ND METATARSAL;  Surgeon: Harden Jerona GAILS, MD;  Location: [facility];  Service: Orthopedics;  Laterality: Right;   Family History  Problem Relation Age of Onset   Diabetes Mother    Heart disease Mother    Hypertension Mother    Kidney disease Mother    Lung cancer Mother    Cancer Mother    Lung cancer Father        + tobacco   Alcohol abuse Father    Cancer Father    Kidney disease Sister    Colon polyps Sister    Diabetes Sister    Hypertension Sister    Hyperlipidemia Sister    Kidney failure Sister    Cancer Sister    Depression Sister    Breast cancer Sister 59   Early death Sister    Pancreatic cancer Maternal Aunt    Heart disease Maternal Grandfather        heart attack   Heart disease Paternal Grandfather        heart attack   Colon cancer Nephew    Esophageal cancer Neg Hx    Inflammatory bowel disease Neg Hx    Liver disease Neg Hx    Rectal cancer Neg Hx    Stomach cancer Neg Hx    BRCA 1/2 Neg Hx    Social History   Socioeconomic History   Marital status: Married    Spouse name: Monica Kane   Number of children: Not on file   Years of education: Not on file   Highest education level: Bachelor's degree (e.g., BA, AB, BS)  Occupational History   Not on file  Tobacco Use   Smoking status: Never   Smokeless tobacco: Never   Tobacco comments:    Both parents smoked and died of lung cancer  Vaping Use   Vaping status: Never Used  Substance and Sexual Activity   Alcohol use: Yes    Comment: Occasionally on weekends   Drug use: Never   Sexual activity: Not Currently    Birth control/protection:  Surgical  Other Topics Concern   Not on file  Social History Narrative   Lives with her husband.  He has 2 adult  children. Patient has a Naval architect, and she does exercise.   Social Drivers of Corporate investment banker Strain: Low Risk  (10/18/2023)   Overall Financial Resource Strain (CARDIA)    Difficulty of Paying Living Expenses: Not hard at all  Food Insecurity: No Food Insecurity (10/18/2023)   Hunger Vital Sign    Worried About Running Out of Food in the Last Year: Never true    Ran Out of Food in the Last Year: Never true  Transportation Needs: No Transportation Needs (10/25/2023)   PRAPARE - Administrator, Civil Service (Medical): No    Lack of Transportation (Non-Medical): No  Physical Activity: Sufficiently Active (10/18/2023)   Exercise Vital Sign    Days of Exercise per Week: 3 days    Minutes of Exercise per Session: 150+ min  Stress: Stress Concern Present (10/18/2023)   [employer] of Occupational Health - Occupational Stress Questionnaire    Feeling of Stress: To some extent  Social Connections: Moderately Isolated (10/18/2023)   Social Connection and Isolation Panel    Frequency of Communication with Friends and Family: Once a week    Frequency of Social Gatherings with Friends and Family: Three times a week    Attends Religious Services: Never    Active Member of Clubs or Organizations: No    Attends Banker Meetings: Never    Marital Status: Married    Tobacco Counseling Counseling given: Not Answered Tobacco comments: Both parents smoked and died of lung cancer    Clinical Intake:  Pre-visit preparation completed: Yes  Pain : No/denies pain     Diabetes: Yes CBG done?: No Did pt. bring in CBG monitor from home?: No  Lab Results  Component Value Date   HGBA1C 7.3 (H) 07/04/2023   HGBA1C 6.6 (H) 10/18/2022   HGBA1C 7.2 (H) 05/04/2021     How often do you need to have someone help you when you read instructions,  pamphlets, or other written materials from your doctor or pharmacy?: 1 - Never  Interpreter Needed?: No  Information entered by :: Charmaine Bloodgood LPN   Activities of Daily Living     10/18/2023    9:48 AM  In your present state of health, do you have any difficulty performing the following activities:  Hearing? 0  Vision? 0  Difficulty concentrating or making decisions? 0  Walking or climbing stairs? 0  Dressing or bathing? 0  Doing errands, shopping? 0  Preparing Food and eating ? N  Using the Toilet? N  In the past six months, have you accidently leaked urine? N  Do you have problems with loss of bowel control? N  Managing your Medications? N  Managing your Finances? N  Housekeeping or managing your Housekeeping? N    Patient Care Team: Copland, Harlene BROCKS, MD as PCP - General (Family Medicine) Alix Charleston, MD as Consulting Physician (Neurosurgery) Leslee Reusing, MD as Consulting Physician (Ophthalmology) Timmy Maude SAUNDERS, MD as Consulting Physician (Oncology) Mansouraty, Aloha Raddle., MD as Consulting Physician (Gastroenterology) Dasie Leonor CROME, MD as Consulting Physician (General Surgery)  I have updated your Care Teams any recent Medical Services you may have received from other providers in the past year.     Assessment:   This is a routine wellness examination for Monica Kane.  Hearing/Vision screen Hearing Screening - Comments:: Some hearing loss  Vision Screening - Comments::  up to date with routine eye exams with Dr. Leslee    Goals Addressed  This Visit's Progress    Maintain current healthy lifestyle.   On track      Depression Screen     10/25/2023    1:28 PM 10/18/2022   10:42 AM 09/28/2021   10:07 AM 05/27/2021    1:01 PM 08/21/2020    8:52 AM 07/17/2018    3:33 PM 08/12/2016    1:18 PM  PHQ 2/9 Scores  PHQ - 2 Score 0 0 0 0 6 0 0  PHQ- 9 Score 7 8  0 12      Fall Risk     10/18/2023    9:48 AM 10/18/2022   10:10 AM 10/13/2022    11:29 AM 09/28/2021   10:05 AM 05/04/2021    9:00 AM  Fall Risk   Falls in the past year? 1 0 0 1 0  Number falls in past yr: 1 0 0 0 0  Injury with Fall? 0 0 0 0 0  Risk for fall due to : History of fall(s) No Fall Risks Other (Comment);No Fall Risks History of fall(s)   Follow up Falls evaluation completed;Education provided;Falls prevention discussed Falls evaluation completed Falls evaluation completed Falls prevention discussed       Data saved with a previous flowsheet row definition    MEDICARE RISK AT HOME:  Medicare Risk at Home Any stairs in or around the home?: (Patient-Rptd) Yes If so, are there any without handrails?: (Patient-Rptd) No Home free of loose throw rugs in walkways, pet beds, electrical cords, etc?: (Patient-Rptd) Yes Adequate lighting in your home to reduce risk of falls?: (Patient-Rptd) Yes Life alert?: (Patient-Rptd) No Use of a cane, walker or w/c?: (Patient-Rptd) No Grab bars in the bathroom?: (Patient-Rptd) Yes Shower chair or bench in shower?: (Patient-Rptd) No Elevated toilet seat or a handicapped toilet?: (Patient-Rptd) No  TIMED UP AND GO:  Was the test performed?  No  Cognitive Function: Declined/Normal: No cognitive concerns noted by patient or family. Patient alert, oriented, able to answer questions appropriately and recall recent events. No signs of memory loss or confusion.        10/20/2022    9:06 AM  6CIT Screen  What Year? 0 points  What month? 0 points  What time? 0 points  Count back from 20 0 points  Months in reverse 0 points  Repeat phrase 0 points  Total Score 0 points    Immunizations Immunization History  Administered Date(s) Administered   Fluad Quad(high Dose 65+) 02/28/2019   Influenza Split 01/15/2021   Influenza, High Dose Seasonal PF 01/04/2018   Influenza,inj,Quad PF,6+ Mos 01/02/2014, 01/22/2015   Influenza-Unspecified 03/24/2022, 01/20/2023   Novavax(Covid-19) Vaccine 01/19/2023   PFIZER Comirnaty(Gray  Top)Covid-19 Tri-Sucrose Vaccine 08/21/2020   PFIZER(Purple Top)SARS-COV-2 Vaccination 05/25/2019, 06/19/2019, 01/15/2020   Pfizer Covid-19 Vaccine Bivalent Booster 64yrs & up 01/15/2021   Pneumococcal Conjugate-13 01/22/2015   Pneumococcal Polysaccharide-23 03/23/2013   Td 02/18/2008   Tdap 10/31/2018   Unspecified SARS-COV-2 Vaccination 01/20/2023   Zoster Recombinant(Shingrix) 03/10/2023    Screening Tests Health Maintenance  Topic Date Due   Diabetic kidney evaluation - Urine ACR  Never done   COVID-19 Vaccine (7 - 2024-25 season) 03/17/2023   Zoster Vaccines- Shingrix (2 of 2) 05/05/2023   FOOT EXAM  10/18/2023   INFLUENZA VACCINE  11/18/2023   HEMOGLOBIN A1C  01/04/2024   Diabetic kidney evaluation - eGFR measurement  05/26/2024   OPHTHALMOLOGY EXAM  07/26/2024   Medicare Annual Wellness (AWV)  10/24/2024   Colonoscopy  03/01/2028  DTaP/Tdap/Td (3 - Td or Tdap) 10/30/2028   Pneumococcal Vaccine: 50+ Years  Completed   DEXA SCAN  Completed   Hepatitis C Screening  Completed   Hepatitis B Vaccines  Aged Out   HPV VACCINES  Aged Out   Meningococcal B Vaccine  Aged Out    Health Maintenance  Health Maintenance Due  Topic Date Due   Diabetic kidney evaluation - Urine ACR  Never done   COVID-19 Vaccine (7 - 2024-25 season) 03/17/2023   Zoster Vaccines- Shingrix (2 of 2) 05/05/2023   FOOT EXAM  10/18/2023   Health Maintenance Items Addressed: Information provided on Shingrix; plans to reschedule dexa   Additional Screening:  Vision Screening: Recommended annual ophthalmology exams for early detection of glaucoma and other disorders of the eye. Would you like a referral to an eye doctor? No    Dental Screening: Recommended annual dental exams for proper oral hygiene  Community Resource Referral / Chronic Care Management: CRR required this visit?  No   CCM required this visit?  No   Plan:    I have personally reviewed and noted the following in the patient's  chart:   Medical and social history Use of alcohol, tobacco or illicit drugs  Current medications and supplements including opioid prescriptions. Patient is not currently taking opioid prescriptions. Functional ability and status Nutritional status Physical activity Advanced directives List of other physicians Hospitalizations, surgeries, and ER visits in previous 12 months Vitals Screenings to include cognitive, depression, and falls Referrals and appointments  In addition, I have reviewed and discussed with patient certain preventive protocols, quality metrics, and best practice recommendations. A written personalized care plan for preventive services as well as general preventive health recommendations were provided to patient.   Lavelle Pfeiffer [place], [place]   05/20/7972   After Visit Summary: (MyChart) Due to this being a telephonic visit, the after visit summary with patients personalized plan was offered to patient via MyChart   Notes: Patient has had 4 falls within the last 6 months that she attributes mostly to balance problems and distraction; fyi

## 2023-10-25 NOTE — Patient Instructions (Signed)
 Ms. Schwarz , Thank you for taking time out of your busy schedule to complete your Annual Wellness Visit with me. I enjoyed our conversation and look forward to speaking with you again next year. I, as well as your care team,  appreciate your ongoing commitment to your health goals. Please review the following plan we discussed and let me know if I can assist you in the future. Your Game plan/ To Do List    Follow up Visits: Next Medicare AWV with our clinical staff: In 1 year    Have you seen your provider in the last 6 months (3 months if uncontrolled diabetes)? Yes Next Office Visit with your provider: To be scheduled   Clinician Recommendations:  Aim for 30 minutes of exercise or brisk walking, 6-8 glasses of water, and 5 servings of fruits and vegetables each day.       This is a list of the screening recommended for you and due dates:  Health Maintenance  Topic Date Due   Yearly kidney health urinalysis for diabetes  Never done   COVID-19 Vaccine (7 - 2024-25 season) 03/17/2023   Zoster (Shingles) Vaccine (2 of 2) 05/05/2023   Complete foot exam   10/18/2023   Flu Shot  11/18/2023   Hemoglobin A1C  01/04/2024   Yearly kidney function blood test for diabetes  05/26/2024   Eye exam for diabetics  07/26/2024   Medicare Annual Wellness Visit  10/24/2024   Colon Cancer Screening  03/01/2028   DTaP/Tdap/Td vaccine (3 - Td or Tdap) 10/30/2028   Pneumococcal Vaccine for age over 78  Completed   DEXA scan (bone density measurement)  Completed   Hepatitis C Screening  Completed   Hepatitis B Vaccine  Aged Out   HPV Vaccine  Aged Out   Meningitis B Vaccine  Aged Out    Advanced directives: (ACP Link)Information on Advanced Care Planning can be found at [place]  Secretary of [facility] Advance Health Care Directives Advance Health Care Directives. [email or web address]   Advance Care Planning is important because it:  [x]  Makes sure you receive the medical care that is consistent with your  values, goals, and preferences  [x]  It provides guidance to your family and loved ones and reduces their decisional burden about whether or not they are making the right decisions based on your wishes.  Follow the link provided in your after visit summary or read over the paperwork we have mailed to you to help you started getting your Advance Directives in place. If you need assistance in completing these, please reach out to us  so that we can help you!  See attachments for Preventive Care and Fall Prevention Tips.

## 2023-10-29 NOTE — Progress Notes (Unsigned)
  Healthcare at [facility] [address] [place], [place] [address] [phone]  Date:  10/31/2023   Name:  Monica Kane   DOB:  1945-07-27   MRN:  [id number]  PCP:  Watt Harlene JAYSON, MD    Chief Complaint: Rash   History of Present Illness:  Monica Kane is a 78 y.o. very pleasant female patient who presents with the following:  Patient in today with concern for shingles.  Most recent visit with myself was in March of this year History of hypertension, diabetes, hyperlipidemia, sleep apnea treated with oral appliance, vitamin D  deficiency, neuroendocrine tumor of the duodenum diagnosed in 2024 In February 2023 she also tested positive for pulmonary embolism in the setting of acute illness, suspected COVID though she tested negative.   Most recent visit with hematology was in [place] is currently on maintenance Xarelto , 10 mg daily.  Plan for long-term anticoagulation   She is being treated by gastroenterology for history of carcinoid as well as pancreatic cyst.  She is a bit concerned as apparently her pancreatic cancer tumor markers went up and they are considering possible resection.  She is having an endoscopy procedure in a couple of months which should help answer this question  She has received Shingrix-I have documentation of 1 dose November 2024.  Patient confirms she never got the second dose.  She will look into getting this done as soon as her current rash is healed Lab Results  Component Value Date   HGBA1C 7.3 (H) 07/04/2023   Diabetes has been under good control.  However she has noted more neuropathy pain in her feet especially at night.  She is already taking trazodone  and amitriptyline  25 mg.  She is also taking bupropion as well as sertraline  250 mg on the advice of her counselor Due for foot exam-update today Due for urine micro-update today  Fosamax  Bupropion, sertraline , trazodone  50 Amitriptyline   25 Carvedilol  Lisinopril  Metformin  500 mg daily Omeprazole  Xarelto  10 daily Crestor  10  Pt notes a rash on her anterior neck for about 3 days It is mostly just itchy She cannot think of any exposure to any new products or other contacts,  she tried hydrocortisone cream but it hurt to apply it so she washed it off  No other areas on her body is affected She otherwise feels normal. She has not been working out in the garden, has not worn any new jewelry or clothing recently    Patient Active Problem List   Diagnosis Date Noted   Change in stool caliber 12/17/2022   Constipation 12/17/2022   Distended abdomen 12/17/2022   Change in bowel habits 12/17/2022   Bloating 12/17/2022   Neuroendocrine tumor 12/17/2022   Pancreatic cyst 03/31/2022   Elevated CA 19-9 level 03/31/2022   Family history of malignant neoplasm of pancreas 03/31/2022   Hx of adenomatous colonic polyps 03/31/2022   Chronic anticoagulation 03/31/2022   IPMN (intraductal papillary mucinous neoplasm) 02/10/2022   Pulmonary embolism (HCC) 07/08/2021   Acquired claw toe, right    Achilles tendon contracture, right    OSA (obstructive sleep apnea) 07/26/2019   Vertigo 10/03/2018   Visual blurriness 10/03/2018   Controlled type 2 diabetes mellitus without complication, without long-term current use of insulin (HCC) 06/05/2015   Primary localized osteoarthritis of right knee    Chest pain, midsternal 07/23/2013   Osteopenia 03/23/2013   Kidney stones, calcium  oxalate 03/23/2013   Hypertension 03/31/2011  Hyperlipidemia 03/31/2011   Vitamin D  deficiency 03/31/2011   Hallux rigidus 03/24/2011   Metatarsalgia of left foot 03/24/2011   Lateral epicondylitis of left elbow 02/24/2011   Migraine 02/24/2011   Arthritis 02/24/2011    Past Medical History:  Diagnosis Date   Allergy    Rash, some swelling on face   Anxiety    takes Ativan nightly   Depression    takes Zoloft  daily   GERD (gastroesophageal  reflux disease) March 2024   History of colon polyps    History of kidney stones    History of staph infection 11/2013   Hyperlipidemia    takes Crestor  nightly    Hypertension    takes Lisinopril  and Coreg  daily   Joint pain    Joint swelling    Neuropathy    takes Elavil  nightly   Pneumonia    Pre-diabetes    Primary localized osteoarthritis of right knee    Pulmonary embolism (HCC) 07/08/2021   Sleep apnea    uses oral appliance    Past Surgical History:  Procedure Laterality Date   ABDOMINAL HYSTERECTOMY  04/20/1987   APPENDECTOMY     BIOPSY  05/13/2022   Procedure: BIOPSY;  Surgeon: Wilhelmenia Aloha Raddle., MD;  Location: [facility];  Service: Gastroenterology;;   BREAST BIOPSY Left 02/16/2016   BREAST EXCISIONAL BIOPSY Right 2008   CARDIAC CATHETERIZATION  04/19/2013   CERVICAL SPINE SURGERY  04/19/2002   C 5-6   COLONOSCOPY     colonscopy     DIAGNOSTIC LAPAROSCOPY  04/19/1974   fibroid removed   ELBOW ARTHROSCOPY  2010, 2012   ESOPHAGOGASTRODUODENOSCOPY (EGD) WITH PROPOFOL  N/A 05/13/2022   Procedure: ESOPHAGOGASTRODUODENOSCOPY (EGD) WITH PROPOFOL ;  Surgeon: Wilhelmenia Aloha Raddle., MD;  Location: [facility];  Service: Gastroenterology;  Laterality: N/A;   ESOPHAGOGASTRODUODENOSCOPY (EGD) WITH PROPOFOL  N/A 07/15/2022   Procedure: ESOPHAGOGASTRODUODENOSCOPY (EGD) WITH PROPOFOL ;  Surgeon: Wilhelmenia Aloha Raddle., MD;  Location: [facility];  Service: Gastroenterology;  Laterality: N/A;   EUS N/A 07/15/2022   Procedure: UPPER ENDOSCOPIC ULTRASOUND (EUS) RADIAL;  Surgeon: Wilhelmenia Aloha Raddle., MD;  Location: [facility];  Service: Gastroenterology;  Laterality: N/A;   EYE SURGERY Bilateral    cataract   FINE NEEDLE ASPIRATION N/A 05/13/2022   Procedure: FINE NEEDLE ASPIRATION (FNA) LINEAR;  Surgeon: Wilhelmenia Aloha Raddle., MD;  Location: [facility];  Service: Gastroenterology;  Laterality: N/A;   FOOT SURGERY Left 04/20/2011   GASTROCNEMIUS RECESSION  Right 10/09/2019   Procedure: RIGHT GASTROCNEMIUS RECESSION;  Surgeon: Harden Jerona GAILS, MD;  Location: [facility];  Service: Orthopedics;  Laterality: Right;   HEMOSTASIS CLIP PLACEMENT  07/15/2022   Procedure: HEMOSTASIS CLIP PLACEMENT;  Surgeon: Wilhelmenia Aloha Raddle., MD;  Location: [facility];  Service: Gastroenterology;;   HYSTEROTOMY  1989   JOINT REPLACEMENT  01/2014   Right knee   KNEE ARTHROSCOPY  01/17/2013   KNEE ARTHROSCOPY W/ MENISCECTOMY Right 01/31/2013   [facility]   LATERAL EPICONDYLE RELEASE  03/01/2011   Procedure: TENNIS ELBOW RELEASE;  Surgeon: Lamar DELENA Millman, MD;  Location:  SURGERY CENTER;  Service: Orthopedics;  Laterality: Left;  TENOTOMY ELBOW LATERAL EPICONDYLITIS TENNIS ELBOW   lateral release right elbow  04/19/2008   left foot surgery  04/20/2011   mortons neuroma   LEFT HEART CATHETERIZATION WITH CORONARY ANGIOGRAM N/A 07/23/2013   Procedure: LEFT HEART CATHETERIZATION WITH CORONARY ANGIOGRAM;  Surgeon: Victory LELON Claudene DOUGLAS, MD;  Location: [facility];  Service: Cardiovascular;  Laterality: N/A;   PAROTID  GLAND TUMOR EXCISION  04/19/1974   benign   PAROTID GLAND TUMOR EXCISION  04/19/1974   rt   SHOULDER ARTHROSCOPY DISTAL CLAVICLE EXCISION AND OPEN ROTATOR CUFF REPAIR  04/19/2004   SHOULDER ARTHROSCOPY W/ ROTATOR CUFF REPAIR  04/19/2004   SUBMUCOSAL LIFTING INJECTION  07/15/2022   Procedure: SUBMUCOSAL LIFTING INJECTION;  Surgeon: Wilhelmenia Aloha Raddle., MD;  Location: [facility];  Service: Gastroenterology;;   TOTAL KNEE ARTHROPLASTY Right 02/18/2014   Procedure: TOTAL KNEE ARTHROPLASTY;  Surgeon: Lamar DELENA Millman, MD;  Location: [facility];  Service: Orthopedics;  Laterality: Right;   UPPER ESOPHAGEAL ENDOSCOPIC ULTRASOUND (EUS) N/A 05/13/2022   Procedure: UPPER ESOPHAGEAL ENDOSCOPIC ULTRASOUND (EUS);  Surgeon: Wilhelmenia Aloha Raddle., MD;  Location: [facility];  Service: Gastroenterology;  Laterality: N/A;   WEIL OSTEOTOMY Right  10/09/2019   Procedure: WEIL OSTEOTOMY RIGHT 2ND METATARSAL;  Surgeon: Harden Jerona GAILS, MD;  Location: [facility];  Service: Orthopedics;  Laterality: Right;    Social History   Tobacco Use   Smoking status: Never   Smokeless tobacco: Never   Tobacco comments:    Both parents smoked and died of lung cancer  Vaping Use   Vaping status: Never Used  Substance Use Topics   Alcohol use: Yes    Comment: Occasionally on weekends   Drug use: Never    Family History  Problem Relation Age of Onset   Diabetes Mother    Heart disease Mother    Hypertension Mother    Kidney disease Mother    Lung cancer Mother    Cancer Mother    Lung cancer Father        + tobacco   Alcohol abuse Father    Cancer Father    Kidney disease Sister    Colon polyps Sister    Diabetes Sister    Hypertension Sister    Hyperlipidemia Sister    Kidney failure Sister    Cancer Sister    Depression Sister    Breast cancer Sister 68   Early death Sister    Pancreatic cancer Maternal Aunt    Heart disease Maternal Grandfather        heart attack   Heart disease Paternal Grandfather        heart attack   Colon cancer Nephew    Esophageal cancer Neg Hx    Inflammatory bowel disease Neg Hx    Liver disease Neg Hx    Rectal cancer Neg Hx    Stomach cancer Neg Hx    BRCA 1/2 Neg Hx     Allergies  Allergen Reactions   Penicillins Swelling and Rash    Facial swelling    Medication list has been reviewed and updated.  Current Outpatient Medications on File Prior to Visit  Medication Sig Dispense Refill   alendronate  (FOSAMAX ) 70 MG tablet Take 1 tablet (70 mg total) by mouth every 7 (seven) days. Take with a full glass of water on an empty stomach. 12 tablet 3   amitriptyline  (ELAVIL ) 25 MG tablet Take 1 tablet (25 mg total) by mouth at bedtime. 90 tablet 3   buPROPion (WELLBUTRIN) 75 MG tablet Take by mouth.     carvedilol  (COREG ) 3.125 MG tablet TAKE ONE TABLET BY MOUTH TWICE DAILY  WITH A MEAL (Patient taking differently: Take 3.125 mg by mouth at bedtime.) 180 tablet 1   lisinopril  (ZESTRIL ) 40 MG tablet Take 1 tablet (40 mg total) by mouth daily. 90 tablet 3   metFORMIN  (GLUCOPHAGE ) 500  MG tablet Take 1 tablet (500 mg total) by mouth daily. 90 tablet 3   omeprazole  (PRILOSEC) 20 MG capsule Take 1 capsule (20 mg total) by mouth 2 (two) times daily before a meal. Twice daily for 1 month then may go back to once daily. 60 capsule 6   polycarbophil (FIBERCON) 625 MG tablet Take 625 mg by mouth daily.     rivaroxaban  (XARELTO ) 10 MG TABS tablet Take 1 tablet (10 mg total) by mouth daily with supper. 30 tablet 12   rosuvastatin  (CRESTOR ) 10 MG tablet Take 1 tablet (10 mg total) by mouth at bedtime. 90 tablet 3   sertraline  (ZOLOFT ) 100 MG tablet Take 2.5 tablets (250 mg total) by mouth daily. 225 tablet 3   traZODone  (DESYREL ) 50 MG tablet Take 1-2 tablets (50-100 mg total) by mouth at bedtime. 180 tablet 1   trimethoprim -polymyxin b  (POLYTRIM ) ophthalmic solution Place 1 drop into both eyes every 4 (four) hours. 10 mL 0   No current facility-administered medications on file prior to visit.    Review of Systems:  As per HPI- otherwise negative.   Physical Examination: Vitals:   10/31/23 1301  BP: 138/70  Pulse: 70  SpO2: 97%   Vitals:   10/31/23 1301  Weight: 156 lb (70.8 kg)  Height: 5' 5 (1.651 m)   Body mass index is 25.96 kg/m. Ideal Body Weight: Weight in (lb) to have BMI = 25: 149.9  GEN: no acute distress.  Normal weight, looks well HEENT: Atraumatic, Normocephalic.  Bilateral TM wnl, oropharynx normal.  PEERL,EOMI.   Ears and Nose: No external deformity. CV: RRR, No M/G/R. No JVD. No thrill. No extra heart sounds. PULM: CTA B, no wheezes, crackles, rhonchi. No retractions. No resp. distress. No accessory muscle use. ABD: S, NT, ND, +BS. No rebound. No HSM. EXTR: No c/c/e PSYCH: Normally interactive. Conversant.  She has an area of rash on her  anterior neck, mostly left to the midline as pictured below    Assessment and Plan: Rash - Plan: valACYclovir  (VALTREX ) 1000 MG tablet  Hereditary and idiopathic peripheral neuropathy - Plan: amitriptyline  (ELAVIL ) 50 MG tablet  Patient seen today with concern of a rash on her anterior neck.  It appears somewhat dry and irritated, most consistent with contact dermatitis.  However patient really cannot think of anything that could have caused this, she tried some hydrocortisone and felt that it burned.  It is mostly just itchy.  Advised her I do not think this is shingles but taking Valtrex  will not harm her.  She would like to use Valtrex  in case there is any chance of shingles, I called in 1000 mg 3 times daily for 1 week  She also notes peripheral neuropathy, likely related to her diabetes.  She is taking amitriptyline  25 mg at bedtime which helps somewhat but she still has symptoms.  She would be interested in increasing to 50 mg.  We discussed potential for serotonin syndrome with her other medications.  I gave her a list of watch out for.  Will also decrease her sertraline  to 200 mg   Signed Aleyssa Pike, MD

## 2023-10-31 ENCOUNTER — Ambulatory Visit (INDEPENDENT_AMBULATORY_CARE_PROVIDER_SITE_OTHER): Admitting: Family Medicine

## 2023-10-31 VITALS — BP 138/70 | HR 70 | Ht 65.0 in | Wt 156.0 lb

## 2023-10-31 DIAGNOSIS — R21 Rash and other nonspecific skin eruption: Secondary | ICD-10-CM | POA: Diagnosis not present

## 2023-10-31 DIAGNOSIS — F321 Major depressive disorder, single episode, moderate: Secondary | ICD-10-CM

## 2023-10-31 DIAGNOSIS — G609 Hereditary and idiopathic neuropathy, unspecified: Secondary | ICD-10-CM | POA: Diagnosis not present

## 2023-10-31 MED ORDER — AMITRIPTYLINE HCL 50 MG PO TABS: 50.0000 mg | ORAL_TABLET | Freq: Every day | ORAL | 1 refills | Status: AC

## 2023-10-31 MED ORDER — VALACYCLOVIR HCL 1 G PO TABS: 1000.0000 mg | ORAL_TABLET | Freq: Three times a day (TID) | ORAL | 0 refills | Status: DC

## 2023-10-31 NOTE — Patient Instructions (Addendum)
 It was good to see you today.  I think the rash on your neck is not shingles, but certainly a course of Valtrex  3 times a day for a week will not harm you.  Once the rash is healed over to be sure to get your second dose of Shingrix vaccine.  For itching you might try Benadryl  cream.  You can also use a nonsedating antihistamine like Claritin or Zyrtec.  If not sufficient you could use oral Benadryl   For neuropathy pain we can try increasing your amitriptyline  to 50 mg.  Please let me know how this works for you.  As we discussed, you are also on sertraline  as well as trazodone , so we need to be aware of the risk of serotonin syndrome.  Please decrease your sertraline  to 200 mg daily with this change.  Here is  little more information about this condition so you can become familiar.   Serotonin syndrome symptoms can range from mild to severe and include mental status changes, autonomic nervous system overactivity, and neuromuscular abnormalities. Symptoms often appear within 24 hours, and usually within 6 hours, of starting a new medication or increasing the dose of a serotonergic drug. Mild cases may be overlooked, but severe cases can be life-threatening.  Common Symptoms: Mental Status Changes: Anxiety, agitation, restlessness, confusion, disorientation, delirium, and hallucinations. Autonomic Nervous System Overactivity: Tachycardia (rapid heart rate), hypertension (high blood pressure), hyperthermia (elevated body temperature), diaphoresis (excessive sweating), shivering, vomiting, and diarrhea. Neuromuscular Abnormalities: Tremor, muscle rigidity, myoclonus (muscle twitching or jerking), hyperreflexia (overactive reflexes), clonus (rhythmic muscle spasms), and Babinski sign. Other: Dilated pupils, increased bowel sounds, ocular clonus (rapid, involuntary eye movements), and seizures.

## 2023-11-24 ENCOUNTER — Ambulatory Visit: Payer: Medicare Other | Admitting: Hematology & Oncology

## 2023-11-24 ENCOUNTER — Inpatient Hospital Stay: Payer: Medicare Other

## 2023-12-15 ENCOUNTER — Other Ambulatory Visit (HOSPITAL_BASED_OUTPATIENT_CLINIC_OR_DEPARTMENT_OTHER)

## 2023-12-20 ENCOUNTER — Telehealth: Payer: Self-pay | Admitting: Gastroenterology

## 2023-12-20 ENCOUNTER — Encounter: Payer: Self-pay | Admitting: Hematology & Oncology

## 2023-12-20 ENCOUNTER — Inpatient Hospital Stay: Attending: Hematology & Oncology

## 2023-12-20 ENCOUNTER — Inpatient Hospital Stay (HOSPITAL_BASED_OUTPATIENT_CLINIC_OR_DEPARTMENT_OTHER): Admitting: Hematology & Oncology

## 2023-12-20 ENCOUNTER — Ambulatory Visit (HOSPITAL_BASED_OUTPATIENT_CLINIC_OR_DEPARTMENT_OTHER)
Admission: RE | Admit: 2023-12-20 | Discharge: 2023-12-20 | Disposition: A | Discharge status: Home / Self Care | Source: Ambulatory Visit | Attending: Family Medicine | Admitting: Family Medicine

## 2023-12-20 VITALS — BP 163/72 | HR 66 | Temp 98.2°F | Resp 18 | Ht 65.0 in | Wt 163.4 lb

## 2023-12-20 DIAGNOSIS — Z7901 Long term (current) use of anticoagulants: Secondary | ICD-10-CM | POA: Insufficient documentation

## 2023-12-20 DIAGNOSIS — Z09 Encounter for follow-up examination after completed treatment for conditions other than malignant neoplasm: Secondary | ICD-10-CM | POA: Insufficient documentation

## 2023-12-20 DIAGNOSIS — I2693 Single subsegmental pulmonary embolism without acute cor pulmonale: Secondary | ICD-10-CM

## 2023-12-20 DIAGNOSIS — M8589 Other specified disorders of bone density and structure, multiple sites: Secondary | ICD-10-CM | POA: Diagnosis not present

## 2023-12-20 DIAGNOSIS — E2839 Other primary ovarian failure: Secondary | ICD-10-CM | POA: Insufficient documentation

## 2023-12-20 DIAGNOSIS — I2699 Other pulmonary embolism without acute cor pulmonale: Secondary | ICD-10-CM

## 2023-12-20 DIAGNOSIS — Z780 Asymptomatic menopausal state: Secondary | ICD-10-CM | POA: Diagnosis not present

## 2023-12-20 DIAGNOSIS — Z86711 Personal history of pulmonary embolism: Secondary | ICD-10-CM | POA: Insufficient documentation

## 2023-12-20 LAB — CBC WITH DIFFERENTIAL (CANCER CENTER ONLY)
Abs Immature Granulocytes: 0.02 K/uL (ref 0.00–0.07)
Basophils Absolute: 0.1 K/uL (ref 0.0–0.1)
Basophils Relative: 1 %
Eosinophils Absolute: 0.2 K/uL (ref 0.0–0.5)
Eosinophils Relative: 2 %
HCT: 41.2 % (ref 36.0–46.0)
Hemoglobin: 13.5 g/dL (ref 12.0–15.0)
Immature Granulocytes: 0 %
Lymphocytes Relative: 30 %
Lymphs Abs: 2.1 K/uL (ref 0.7–4.0)
MCH: 26.5 pg (ref 26.0–34.0)
MCHC: 32.8 g/dL (ref 30.0–36.0)
MCV: 80.9 fL (ref 80.0–100.0)
Monocytes Absolute: 0.5 K/uL (ref 0.1–1.0)
Monocytes Relative: 6 %
Neutro Abs: 4.3 K/uL (ref 1.7–7.7)
Neutrophils Relative %: 61 %
Platelet Count: 198 K/uL (ref 150–400)
RBC: 5.09 MIL/uL (ref 3.87–5.11)
RDW: 14.6 % (ref 11.5–15.5)
WBC Count: 7.1 K/uL (ref 4.0–10.5)
nRBC: 0 % (ref 0.0–0.2)

## 2023-12-20 LAB — D-DIMER, QUANTITATIVE: D-Dimer, Quant: <0.27 ug{FEU}/mL (ref 0.00–0.50)

## 2023-12-20 LAB — CMP (CANCER CENTER ONLY)
ALT: 13 U/L (ref 0–44)
AST: 19 U/L (ref 15–41)
Albumin: 4.3 g/dL (ref 3.5–5.0)
Alkaline Phosphatase: 73 U/L (ref 38–126)
Anion gap: 11 (ref 5–15)
BUN: 17 mg/dL (ref 8–23)
CO2: 24 mmol/L (ref 22–32)
Calcium: 9.8 mg/dL (ref 8.9–10.3)
Chloride: 106 mmol/L (ref 98–111)
Creatinine: 0.96 mg/dL (ref 0.44–1.00)
GFR, Estimated: >60 mL/min (ref >60)
Glucose, Bld: 170 mg/dL — ABNORMAL HIGH (ref 70–99)
Potassium: 4.1 mmol/L (ref 3.5–5.1)
Sodium: 141 mmol/L (ref 135–145)
Total Bilirubin: 0.4 mg/dL (ref 0.0–1.2)
Total Protein: 6.6 g/dL (ref 6.5–8.1)

## 2023-12-20 NOTE — Progress Notes (Signed)
 Hematology and Oncology Follow Up Visit  Monica Kane [id number] 1945-06-09 78 y.o. 12/20/2023   Principle Diagnosis:  Pulmonary embolism-bilateral-idiopathic  Current Therapy:   Xarelto  20 mg p.o. daily-started on 05/24/2021 -changed to 10 mg p.o. daily on 05/27/2023     Interim History:  Monica Kane is back for follow-up.  We last saw her back in February.  Since then, she had been quite busy.  She was over in [place] for a dance competition.  She was in [place].  She had a very good time over there.  She just got back from [place].  She has a niece who lives in [place].  She had a wonderful time over in [place].  She is feeling well.  She really has had no complaints.  She has had no issues with nausea or vomiting.  There is been no chest pain.  She has had no cough.  With all of her traveling, she has done well.  She continues on Xarelto  10 mg daily.  She has had no problems with bowels or bladder..  She does have some varicose veins on the legs.  Circulation however is adequate.  She has had no headache.  She has had no rashes.  She did have a episode of shingles which appear to be on the left side of her neck.  This was a few weeks ago.  She was put on some antiviral.  She is going to have a endoscopic ultrasound and biopsy.  Apparently, she had a Monica Kane benign that was elevated.  She has had these pancreatic cysts.  She had MRI that was done in June which showed a slight increase in the cyst.  Again, she will have an endoscopic ultrasound.  Hopefully everything will be negative.  Overall, I would say that her performance status is probably ECOG 0.    Medications:  Current Outpatient Medications:    alendronate  (FOSAMAX ) 70 MG tablet, Take 1 tablet (70 mg total) by mouth every 7 (seven) days. Take with a full glass of water on an empty stomach., Disp: 12 tablet, Rfl: 3   amitriptyline  (ELAVIL ) 50 MG tablet, Take 1 tablet (50 mg total) by mouth at bedtime., Disp: 90 tablet, Rfl: 1    carvedilol  (COREG ) 3.125 MG tablet, TAKE ONE TABLET BY MOUTH TWICE DAILY WITH A MEAL, Disp: 180 tablet, Rfl: 1   lisinopril  (ZESTRIL ) 40 MG tablet, Take 1 tablet (40 mg total) by mouth daily., Disp: 90 tablet, Rfl: 3   metFORMIN  (GLUCOPHAGE ) 500 MG tablet, Take 1 tablet (500 mg total) by mouth daily., Disp: 90 tablet, Rfl: 3   polycarbophil (FIBERCON) 625 MG tablet, Take 625 mg by mouth daily., Disp: , Rfl:    rivaroxaban  (XARELTO ) 10 MG TABS tablet, Take 1 tablet (10 mg total) by mouth daily with supper., Disp: 30 tablet, Rfl: 12   rosuvastatin  (CRESTOR ) 10 MG tablet, Take 1 tablet (10 mg total) by mouth at bedtime., Disp: 90 tablet, Rfl: 3   sertraline  (ZOLOFT ) 100 MG tablet, Take 2 tablets (200 mg total) by mouth daily., Disp: , Rfl:    traZODone  (DESYREL ) 50 MG tablet, Take 1-2 tablets (50-100 mg total) by mouth at bedtime., Disp: 180 tablet, Rfl: 1  Allergies:  Allergies  Allergen Reactions   Penicillins Swelling and Rash    Facial swelling    Past Medical History, Surgical history, Social history, and Family History were reviewed and updated.  Review of Systems: Review of Systems  Constitutional: Negative.   HENT:  Negative.    Eyes: Negative.   Respiratory: Negative.    Cardiovascular: Negative.   Gastrointestinal:  Positive for abdominal distention and abdominal pain. Negative for blood in stool, constipation, diarrhea, nausea and vomiting.  Endocrine: Negative.   Genitourinary: Negative.    Musculoskeletal: Negative.   Skin: Negative.   Neurological: Negative.   Hematological: Negative.   Psychiatric/Behavioral: Negative.      Physical Exam:  height is 5' 5 (1.651 m) and weight is 163 lb 6.4 oz (74.1 kg). Her oral temperature is 98.2 F (36.8 C). Her blood pressure is 163/72 (abnormal) and her pulse is 66. Her respiration is 18 and oxygen saturation is 100%.   Wt Readings from Last 3 Encounters:  12/20/23 163 lb 6.4 oz (74.1 kg)  10/31/23 156 lb (70.8 kg)   10/25/23 155 lb (70.3 kg)    Physical Exam Vitals reviewed.  HENT:     Head: Normocephalic and atraumatic.  Eyes:     Pupils: Pupils are equal, round, and reactive to light.  Cardiovascular:     Rate and Rhythm: Normal rate and regular rhythm.     Heart sounds: Normal heart sounds.  Pulmonary:     Effort: Pulmonary effort is normal.     Breath sounds: Normal breath sounds.  Abdominal:     General: There is distension.     Palpations: There is no mass.     Tenderness: There is no abdominal tenderness. There is no guarding.     Comments: Hypoactive bowel sounds  Musculoskeletal:        General: No tenderness or deformity. Normal range of motion.     Cervical back: Normal range of motion.  Lymphadenopathy:     Cervical: No cervical adenopathy.  Skin:    General: Skin is warm and dry.     Findings: No erythema or rash.  Neurological:     Mental Status: She is alert and oriented to person, place, and time.  Psychiatric:        Behavior: Behavior normal.        Thought Content: Thought content normal.        Judgment: Judgment normal.      Lab Results  Component Value Date   WBC 7.1 12/20/2023   HGB 13.5 12/20/2023   HCT 41.2 12/20/2023   MCV 80.9 12/20/2023   PLT 198 12/20/2023     Chemistry      Component Value Date/Time   NA 144 05/27/2023 0901   K 4.2 05/27/2023 0901   CL 108 05/27/2023 0901   CO2 29 05/27/2023 0901   BUN 15 05/27/2023 0901   CREATININE 1.05 (H) 05/27/2023 0901   CREATININE 0.81 08/12/2016 1348      Component Value Date/Time   CALCIUM  9.7 05/27/2023 0901   ALKPHOS 67 05/27/2023 0901   AST 18 05/27/2023 0901   ALT 11 05/27/2023 0901   BILITOT 0.3 05/27/2023 0901      Impression and Plan: Monica Kane is a very nice 78 year old white female.  She had bilateral pulmonary emboli.  This is idiopathic from all of her studies to date.   Everything from my point of view is okay with respect to anticoagulation.  Again, if she cannot have the  endoscopic ultrasound, we will have to hold the Xarelto  for couple days.  I do not see a problem with this.  We will plan to get her back in another 6 months.   Monica JONELLE Crease, MD 9/2/20258:41 AM

## 2023-12-20 NOTE — Telephone Encounter (Signed)
 Patient states she is returning a call.Please advise.

## 2023-12-21 ENCOUNTER — Encounter: Payer: Self-pay | Admitting: Family Medicine

## 2023-12-21 ENCOUNTER — Encounter (HOSPITAL_COMMUNITY): Payer: Self-pay | Admitting: Gastroenterology

## 2023-12-21 ENCOUNTER — Telehealth: Payer: Self-pay | Admitting: Gastroenterology

## 2023-12-21 NOTE — Telephone Encounter (Signed)
 Inbound call from pt and stated that she has had her procedure rescheduled for tomorrow. And she actually had a phone call today from patty and the [facility] hospital nurse. Please advise.

## 2023-12-21 NOTE — Telephone Encounter (Signed)
 The pt has been moved to 9/4 at 230 pm at [facility] with GM.  She has been instructed and has held Xarelto .  She will call if she has any further questions

## 2023-12-21 NOTE — Telephone Encounter (Addendum)
 Procedure:Upper EUS Procedure date: 12/29/23 Procedure location: [facility] Arrival Time: 8:00 am Spoke with the patient Y/N:   No, I left a detailed message on [phone] on 12/21/23 @ 1:49 pm for the patient to return call   Procedure have been reschedule for  a sooner date, 12/22/23.  Any prep concerns? ___  Has the patient obtained the prep from the pharmacy ? No prep needed Do you have a care partner and transportation: ___ Any additional concerns? ___

## 2023-12-21 NOTE — Telephone Encounter (Signed)
[name] - thank you - noted

## 2023-12-22 ENCOUNTER — Encounter (HOSPITAL_COMMUNITY)
Admission: RE | Disposition: A | Discharge status: Home / Self Care | Payer: Self-pay | Source: Home / Self Care | Attending: Gastroenterology

## 2023-12-22 ENCOUNTER — Ambulatory Visit (HOSPITAL_COMMUNITY): Admitting: Anesthesiology

## 2023-12-22 ENCOUNTER — Encounter (HOSPITAL_COMMUNITY): Payer: Self-pay | Admitting: Gastroenterology

## 2023-12-22 ENCOUNTER — Other Ambulatory Visit: Payer: Self-pay

## 2023-12-22 ENCOUNTER — Ambulatory Visit (HOSPITAL_COMMUNITY)
Admission: RE | Admit: 2023-12-22 | Discharge: 2023-12-22 | Disposition: A | Discharge status: Home / Self Care | Attending: Gastroenterology | Admitting: Gastroenterology

## 2023-12-22 DIAGNOSIS — E119 Type 2 diabetes mellitus without complications: Secondary | ICD-10-CM

## 2023-12-22 DIAGNOSIS — I10 Essential (primary) hypertension: Secondary | ICD-10-CM | POA: Diagnosis not present

## 2023-12-22 DIAGNOSIS — K2960 Other gastritis without bleeding: Secondary | ICD-10-CM

## 2023-12-22 DIAGNOSIS — K209 Esophagitis, unspecified without bleeding: Secondary | ICD-10-CM | POA: Insufficient documentation

## 2023-12-22 DIAGNOSIS — G4730 Sleep apnea, unspecified: Secondary | ICD-10-CM | POA: Insufficient documentation

## 2023-12-22 DIAGNOSIS — K2980 Duodenitis without bleeding: Secondary | ICD-10-CM | POA: Diagnosis not present

## 2023-12-22 DIAGNOSIS — M1990 Unspecified osteoarthritis, unspecified site: Secondary | ICD-10-CM | POA: Diagnosis not present

## 2023-12-22 DIAGNOSIS — K862 Cyst of pancreas: Secondary | ICD-10-CM | POA: Insufficient documentation

## 2023-12-22 DIAGNOSIS — Q399 Congenital malformation of esophagus, unspecified: Secondary | ICD-10-CM | POA: Diagnosis not present

## 2023-12-22 DIAGNOSIS — K869 Disease of pancreas, unspecified: Secondary | ICD-10-CM

## 2023-12-22 DIAGNOSIS — K449 Diaphragmatic hernia without obstruction or gangrene: Secondary | ICD-10-CM | POA: Insufficient documentation

## 2023-12-22 DIAGNOSIS — Z86012 Personal history of benign carcinoid tumor: Secondary | ICD-10-CM | POA: Insufficient documentation

## 2023-12-22 DIAGNOSIS — F418 Other specified anxiety disorders: Secondary | ICD-10-CM | POA: Insufficient documentation

## 2023-12-22 DIAGNOSIS — K219 Gastro-esophageal reflux disease without esophagitis: Secondary | ICD-10-CM | POA: Diagnosis not present

## 2023-12-22 DIAGNOSIS — K3189 Other diseases of stomach and duodenum: Secondary | ICD-10-CM | POA: Insufficient documentation

## 2023-12-22 DIAGNOSIS — G709 Myoneural disorder, unspecified: Secondary | ICD-10-CM | POA: Diagnosis not present

## 2023-12-22 DIAGNOSIS — K2289 Other specified disease of esophagus: Secondary | ICD-10-CM | POA: Diagnosis not present

## 2023-12-22 DIAGNOSIS — I899 Noninfective disorder of lymphatic vessels and lymph nodes, unspecified: Secondary | ICD-10-CM

## 2023-12-22 DIAGNOSIS — D3A8 Other benign neuroendocrine tumors: Secondary | ICD-10-CM

## 2023-12-22 DIAGNOSIS — K2950 Unspecified chronic gastritis without bleeding: Secondary | ICD-10-CM | POA: Diagnosis not present

## 2023-12-22 DIAGNOSIS — K8689 Other specified diseases of pancreas: Secondary | ICD-10-CM

## 2023-12-22 DIAGNOSIS — R7989 Other specified abnormal findings of blood chemistry: Secondary | ICD-10-CM

## 2023-12-22 DIAGNOSIS — K31A19 Gastric intestinal metaplasia without dysplasia, unspecified site: Secondary | ICD-10-CM | POA: Diagnosis not present

## 2023-12-22 HISTORY — PX: FINE NEEDLE ASPIRATION: SHX6590

## 2023-12-22 HISTORY — PX: EUS: SHX5427

## 2023-12-22 HISTORY — PX: ESOPHAGOGASTRODUODENOSCOPY: SHX5428

## 2023-12-22 SURGERY — ULTRASOUND, UPPER GI TRACT, ENDOSCOPIC
Anesthesia: Monitor Anesthesia Care

## 2023-12-22 MED ORDER — ESOMEPRAZOLE MAGNESIUM 40 MG PO CPDR: 40.0000 mg | DELAYED_RELEASE_CAPSULE | Freq: Every day | ORAL | 6 refills | Status: AC

## 2023-12-22 MED ORDER — LACTATED RINGERS IV SOLN: INTRAVENOUS | Status: DC | PRN

## 2023-12-22 MED ORDER — CIPROFLOXACIN HCL 500 MG PO TABS: 500.0000 mg | ORAL_TABLET | Freq: Two times a day (BID) | ORAL | 0 refills | Status: AC

## 2023-12-22 MED ORDER — PROPOFOL 1000 MG/100ML IV EMUL
INTRAVENOUS | Status: AC
  Filled 2023-12-22: qty 100

## 2023-12-22 MED ORDER — LIDOCAINE 2% (20 MG/ML) 5 ML SYRINGE
INTRAMUSCULAR | Status: DC | PRN
  Administered 2023-12-22: 100 mg via INTRAVENOUS

## 2023-12-22 MED ORDER — SODIUM CHLORIDE 0.9 % IV SOLN: INTRAVENOUS | Status: DC

## 2023-12-22 MED ORDER — GLYCOPYRROLATE PF 0.2 MG/ML IJ SOSY
PREFILLED_SYRINGE | INTRAMUSCULAR | Status: DC | PRN
  Administered 2023-12-22: .2 mg via INTRAVENOUS

## 2023-12-22 MED ORDER — PROPOFOL 10 MG/ML IV BOLUS
INTRAVENOUS | Status: AC
  Filled 2023-12-22: qty 20

## 2023-12-22 MED ORDER — CIPROFLOXACIN IN D5W 400 MG/200ML IV SOLN
INTRAVENOUS | Status: AC
  Filled 2023-12-22: qty 200

## 2023-12-22 MED ORDER — PROPOFOL 500 MG/50ML IV EMUL
INTRAVENOUS | Status: DC | PRN
  Administered 2023-12-22: 50 mg via INTRAVENOUS
  Administered 2023-12-22: 80 mg via INTRAVENOUS
  Administered 2023-12-22: 100 ug/kg/min via INTRAVENOUS

## 2023-12-22 MED ORDER — CIPROFLOXACIN IN D5W 400 MG/200ML IV SOLN
INTRAVENOUS | Status: DC | PRN
  Administered 2023-12-22: 400 mg via INTRAVENOUS

## 2023-12-22 NOTE — Discharge Instructions (Addendum)
 YOU MAY RESTART YOUR XARELTO  IN 2 DAYS.  YOU HAD AN ENDOSCOPIC PROCEDURE TODAY: Refer to the procedure report and other information in the discharge instructions given to you for any specific questions about what was found during the examination. If this information does not answer your questions, please call [place] office at [phone] to clarify.   YOU SHOULD EXPECT: Some feelings of bloating in the abdomen. Passage of more gas than usual. Walking can help get rid of the air that was put into your GI tract during the procedure and reduce the bloating.  DIET: Your first meal following the procedure should be a light meal and then it is ok to progress to your normal diet. A half-sandwich or bowl of soup is an example of a good first meal. Heavy or fried foods are harder to digest and may make you feel nauseous or bloated. Drink plenty of fluids but you should avoid alcoholic beverages for 24 hours. If you had a esophageal dilation, please see attached instructions for diet.    ACTIVITY: Your care partner should take you home directly after the procedure. You should plan to take it easy, moving slowly for the rest of the day. You can resume normal activity the day after the procedure however YOU SHOULD NOT DRIVE, use power tools, machinery or perform tasks that involve climbing or major physical exertion for 24 hours (because of the sedation medicines used during the test).   SYMPTOMS TO REPORT IMMEDIATELY: A gastroenterologist can be reached at any hour. Please call [phone]  for any of the following symptoms:   Following upper endoscopy (EGD, EUS, ERCP, esophageal dilation) Vomiting of blood or coffee ground material  New, significant abdominal pain  New, significant chest pain or pain under the shoulder blades  Painful or persistently difficult swallowing  New shortness of breath  Black, tarry-looking or red, bloody stools  FOLLOW UP:  If any biopsies were taken you will be contacted  by phone or by letter within the next 1-3 weeks. Call [phone]  if you have not heard about the biopsies in 3 weeks.  Please also call with any specific questions about appointments or follow up tests.

## 2023-12-22 NOTE — Transfer of Care (Signed)
 Immediate Anesthesia Transfer of Care Note  Patient: Monica Kane  Procedure(s) Performed: ULTRASOUND, UPPER GI TRACT, ENDOSCOPIC EGD (ESOPHAGOGASTRODUODENOSCOPY)  Patient Location: PACU and Endoscopy Unit  Anesthesia Type:MAC  Level of Consciousness: awake, alert , oriented, and patient cooperative  Airway & Oxygen Therapy: Patient Spontanous Breathing and Patient connected to face mask oxygen  Post-op Assessment: Report given to RN and Post -op Vital signs reviewed and stable  Post vital signs: Reviewed and stable  Last Vitals:  Vitals Value Taken Time  BP 160/63   Temp    Pulse 66   Resp 17   SpO2 98     Last Pain:  Vitals:   12/22/23 1315  TempSrc: Temporal  PainSc: 0-No pain      Patients Stated Pain Goal: 0 (12/22/23 1315)  Complications: No notable events documented.

## 2023-12-22 NOTE — Anesthesia Preprocedure Evaluation (Addendum)
 Anesthesia Evaluation  Patient identified by MRN, date of birth, ID band Patient awake    Reviewed: Allergy & Precautions, NPO status , Patient's Chart, lab work & pertinent test results, reviewed documented beta blocker date and time   Airway Mallampati: III  TM Distance: <3 FB Neck ROM: Full    Dental  (+) Teeth Intact, Dental Advisory Given   Pulmonary sleep apnea , PE (2023)   Pulmonary exam normal breath sounds clear to auscultation       Cardiovascular hypertension, Pt. on home beta blockers and Pt. on medications Normal cardiovascular exam Rhythm:Regular Rate:Normal     Neuro/Psych  Headaches PSYCHIATRIC DISORDERS Anxiety Depression     Neuromuscular disease    GI/Hepatic Neg liver ROS,GERD  ,,  Endo/Other  diabetes, Type 2    Renal/GU negative Renal ROS     Musculoskeletal  (+) Arthritis ,    Abdominal   Peds  Hematology  (+) Blood dyscrasia (Xarelto )   Anesthesia Other Findings Day of surgery medications reviewed with the patient.  Reproductive/Obstetrics                              Anesthesia Physical Anesthesia Plan  ASA: 2  Anesthesia Plan: MAC   Post-op Pain Management:    Induction: Intravenous  PONV Risk Score and Plan: 2 and TIVA and Treatment may vary due to age or medical condition  Airway Management Planned: Natural Airway and Simple Face Mask  Additional Equipment:   Intra-op Plan:   Post-operative Plan:   Informed Consent: I have reviewed the patients History and Physical, chart, labs and discussed the procedure including the risks, benefits and alternatives for the proposed anesthesia with the patient or authorized representative who has indicated his/her understanding and acceptance.     Dental advisory given  Plan Discussed with: CRNA and Anesthesiologist  Anesthesia Plan Comments:          Anesthesia Quick Evaluation

## 2023-12-22 NOTE — H&P (Signed)
 GASTROENTEROLOGY PROCEDURE H&P NOTE   Primary Care Physician: Watt Harlene BROCKS, MD  HPI: Monica Kane is a 78 y.o. 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  Past Medical History:  Diagnosis Date   Allergy    Rash, some swelling on face   Anxiety    takes Ativan nightly   Depression    takes Zoloft  daily   GERD (gastroesophageal reflux disease) March 2024   History of colon polyps    History of kidney stones    History of staph infection 11/2013   Hyperlipidemia    takes Crestor  nightly    Hypertension    takes Lisinopril  and Coreg  daily   Joint pain    Joint swelling    Neuropathy    takes Elavil  nightly   Pneumonia    Pre-diabetes    Primary localized osteoarthritis of right knee    Pulmonary embolism (HCC) 07/08/2021   Sleep apnea    uses oral appliance   Past Surgical History:  Procedure Laterality Date   ABDOMINAL HYSTERECTOMY  04/20/1987   APPENDECTOMY     BIOPSY  05/13/2022   Procedure: BIOPSY;  Surgeon: Wilhelmenia Aloha Raddle., MD;  Location: [facility];  Service: Gastroenterology;;   BREAST BIOPSY Left 02/16/2016   BREAST EXCISIONAL BIOPSY Right 2008   CARDIAC CATHETERIZATION  04/19/2013   CERVICAL SPINE SURGERY  04/19/2002   C 5-6   COLONOSCOPY     colonscopy     DIAGNOSTIC LAPAROSCOPY  04/19/1974   fibroid removed   ELBOW ARTHROSCOPY  2010, 2012   ESOPHAGOGASTRODUODENOSCOPY (EGD) WITH PROPOFOL  N/A 05/13/2022   Procedure: ESOPHAGOGASTRODUODENOSCOPY (EGD) WITH PROPOFOL ;  Surgeon: Wilhelmenia Aloha Raddle., MD;  Location: [facility];  Service: Gastroenterology;  Laterality: N/A;   ESOPHAGOGASTRODUODENOSCOPY (EGD) WITH PROPOFOL  N/A 07/15/2022   Procedure: ESOPHAGOGASTRODUODENOSCOPY (EGD) WITH PROPOFOL ;  Surgeon: Wilhelmenia Aloha Raddle., MD;  Location: [facility];  Service: Gastroenterology;  Laterality: N/A;   EUS N/A 07/15/2022   Procedure: UPPER ENDOSCOPIC ULTRASOUND  (EUS) RADIAL;  Surgeon: Wilhelmenia Aloha Raddle., MD;  Location: [facility];  Service: Gastroenterology;  Laterality: N/A;   EYE SURGERY Bilateral    cataract   FINE NEEDLE ASPIRATION N/A 05/13/2022   Procedure: FINE NEEDLE ASPIRATION (FNA) LINEAR;  Surgeon: Wilhelmenia Aloha Raddle., MD;  Location: [facility];  Service: Gastroenterology;  Laterality: N/A;   FOOT SURGERY Left 04/20/2011   GASTROCNEMIUS RECESSION Right 10/09/2019   Procedure: RIGHT GASTROCNEMIUS RECESSION;  Surgeon: Harden Jerona GAILS, MD;  Location: [facility];  Service: Orthopedics;  Laterality: Right;   HEMOSTASIS CLIP PLACEMENT  07/15/2022   Procedure: HEMOSTASIS CLIP PLACEMENT;  Surgeon: Wilhelmenia Aloha Raddle., MD;  Location: [facility];  Service: Gastroenterology;;   HYSTEROTOMY  1989   JOINT REPLACEMENT  01/2014   Right knee   KNEE ARTHROSCOPY  01/17/2013   KNEE ARTHROSCOPY W/ MENISCECTOMY Right 01/31/2013   [place]   LATERAL EPICONDYLE RELEASE  03/01/2011   Procedure: TENNIS ELBOW RELEASE;  Surgeon: Lamar DELENA Millman, MD;  Location: [facility];  Service: Orthopedics;  Laterality: Left;  TENOTOMY ELBOW LATERAL EPICONDYLITIS TENNIS ELBOW   lateral release right elbow  04/19/2008   left foot surgery  04/20/2011   mortons neuroma   LEFT HEART CATHETERIZATION WITH CORONARY ANGIOGRAM N/A 07/23/2013   Procedure: LEFT HEART CATHETERIZATION WITH CORONARY ANGIOGRAM;  Surgeon: Victory LELON Claudene DOUGLAS, MD;  Location: [facility];  Service: Cardiovascular;  Laterality: N/A;   PAROTID  GLAND TUMOR EXCISION  04/19/1974   benign   PAROTID GLAND TUMOR EXCISION  04/19/1974   rt   SHOULDER ARTHROSCOPY DISTAL CLAVICLE EXCISION AND OPEN ROTATOR CUFF REPAIR  04/19/2004   SHOULDER ARTHROSCOPY W/ ROTATOR CUFF REPAIR  04/19/2004   SUBMUCOSAL LIFTING INJECTION  07/15/2022   Procedure: SUBMUCOSAL LIFTING INJECTION;  Surgeon: Wilhelmenia Aloha Raddle., MD;  Location: [facility];  Service: Gastroenterology;;   TOTAL KNEE  ARTHROPLASTY Right 02/18/2014   Procedure: TOTAL KNEE ARTHROPLASTY;  Surgeon: Lamar DELENA Millman, MD;  Location: [facility];  Service: Orthopedics;  Laterality: Right;   UPPER ESOPHAGEAL ENDOSCOPIC ULTRASOUND (EUS) N/A 05/13/2022   Procedure: UPPER ESOPHAGEAL ENDOSCOPIC ULTRASOUND (EUS);  Surgeon: Wilhelmenia Aloha Raddle., MD;  Location: [facility];  Service: Gastroenterology;  Laterality: N/A;   WEIL OSTEOTOMY Right 10/09/2019   Procedure: WEIL OSTEOTOMY RIGHT 2ND METATARSAL;  Surgeon: Harden Jerona GAILS, MD;  Location: [facility];  Service: Orthopedics;  Laterality: Right;   No current facility-administered medications for this encounter.   No current facility-administered medications for this encounter. Allergies  Allergen Reactions   Penicillins Swelling and Rash    Facial swelling   Family History  Problem Relation Age of Onset   Diabetes Mother    Heart disease Mother    Hypertension Mother    Kidney disease Mother    Lung cancer Mother    Cancer Mother    Lung cancer Father        + tobacco   Alcohol abuse Father    Cancer Father    Kidney disease Sister    Colon polyps Sister    Diabetes Sister    Hypertension Sister    Hyperlipidemia Sister    Kidney failure Sister    Cancer Sister    Depression Sister    Breast cancer Sister 33   Early death Sister    Pancreatic cancer Maternal Aunt    Heart disease Maternal Grandfather        heart attack   Heart disease Paternal Grandfather        heart attack   Colon cancer Nephew    Esophageal cancer Neg Hx    Inflammatory bowel disease Neg Hx    Liver disease Neg Hx    Rectal cancer Neg Hx    Stomach cancer Neg Hx    BRCA 1/2 Neg Hx    Social History   Socioeconomic History   Marital status: Married    Spouse name: Havanah Nelms   Number of children: Not on file   Years of education: Not on file   Highest education level: Bachelor's degree (e.g., BA, AB, BS)  Occupational History   Not on file  Tobacco  Use   Smoking status: Never   Smokeless tobacco: Never   Tobacco comments:    Both parents smoked and died of lung cancer  Vaping Use   Vaping status: Never Used  Substance and Sexual Activity   Alcohol use: Yes    Comment: Occasionally on weekends   Drug use: Never   Sexual activity: Not Currently    Birth control/protection: Surgical  Other Topics Concern   Not on file  Social History Narrative   Lives with her husband.  He has 2 adult children. Patient has a Naval architect, and she does exercise.   Social Drivers of Corporate investment banker Strain: Low Risk  (10/18/2023)   Overall Financial Resource Strain (CARDIA)    Difficulty of Paying Living Expenses: Not hard at  all  Food Insecurity: No Food Insecurity (10/18/2023)   Hunger Vital Sign    Worried About Running Out of Food in the Last Year: Never true    Ran Out of Food in the Last Year: Never true  Transportation Needs: No Transportation Needs (10/25/2023)   PRAPARE - Administrator, Civil Service (Medical): No    Lack of Transportation (Non-Medical): No  Physical Activity: Sufficiently Active (10/18/2023)   Exercise Vital Sign    Days of Exercise per Week: 3 days    Minutes of Exercise per Session: 150+ min  Stress: Stress Concern Present (10/18/2023)   [employer] of Occupational Health - Occupational Stress Questionnaire    Feeling of Stress: To some extent  Social Connections: Moderately Isolated (10/18/2023)   Social Connection and Isolation Panel    Frequency of Communication with Friends and Family: Once a week    Frequency of Social Gatherings with Friends and Family: Three times a week    Attends Religious Services: Never    Active Member of Clubs or Organizations: No    Attends Banker Meetings: Never    Marital Status: Married  Catering manager Violence: Not At Risk (10/25/2023)   Humiliation, Afraid, Rape, and Kick questionnaire    Fear of Current or Ex-Partner: No     Emotionally Abused: No    Physically Abused: No    Sexually Abused: No    Physical Exam: Today's Vitals   12/22/23 1315  BP: (!) 174/62  Resp: 13  Temp: 97.7 F (36.5 C)  TempSrc: Temporal  SpO2: 96%  Weight: 72.6 kg  Height: 5' 5 (1.651 m)  PainSc: 0-No pain   Body mass index is 26.63 kg/m. GEN: NAD EYE: Sclerae anicteric ENT: MMM CV: Non-tachycardic GI: Soft, NT/ND NEURO:  Alert & Oriented x 3  Lab Results: Recent Labs    12/20/23 0805  WBC 7.1  HGB 13.5  HCT 41.2  PLT 198   BMET Recent Labs    12/20/23 0904  NA 141  K 4.1  CL 106  CO2 24  GLUCOSE 170*  BUN 17  CREATININE 0.96  CALCIUM  9.8   LFT Recent Labs    12/20/23 0904  PROT 6.6  ALBUMIN 4.3  AST 19  ALT 13  ALKPHOS 73  BILITOT 0.4   PT/INR No results for input(s): LABPROT, INR in the last 72 hours.   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  The risks of an EUS including intestinal perforation, bleeding, infection, aspiration, and medication effects were discussed as was the possibility it may not give a definitive diagnosis if a biopsy is performed.  When a biopsy of the pancreas is done as part of the EUS, there is an additional risk of pancreatitis at the rate of about 1-2%.  It was explained that procedure related pancreatitis is typically mild, although it can be severe and even life threatening, which is why we do not perform random pancreatic biopsies and only biopsy a lesion/area we feel is concerning enough to warrant the risk.   The risks and benefits of endoscopic evaluation/treatment were discussed with the patient and/or family; these include but are not limited to the risk of perforation, infection, bleeding, missed lesions, lack of diagnosis, severe illness requiring hospitalization, as well as anesthesia and sedation related illnesses.  The patient's history has  been reviewed, patient examined, no change  in status, and deemed stable for procedure.  The patient and/or family is agreeable to proceed.    Aloha Finner, MD [facility] Office # [phone]

## 2023-12-22 NOTE — Anesthesia Postprocedure Evaluation (Signed)
 Anesthesia Post Note  Patient: Monica Kane  Procedure(s) Performed: ULTRASOUND, UPPER GI TRACT, ENDOSCOPIC EGD (ESOPHAGOGASTRODUODENOSCOPY)     Patient location during evaluation: Endoscopy Anesthesia Type: MAC Level of consciousness: oriented, awake and alert and awake Pain management: pain level controlled Vital Signs Assessment: post-procedure vital signs reviewed and stable Respiratory status: spontaneous breathing, nonlabored ventilation, respiratory function stable and patient connected to nasal cannula oxygen Cardiovascular status: blood pressure returned to baseline and stable Postop Assessment: no headache, no backache and no apparent nausea or vomiting Anesthetic complications: no   No notable events documented.  Last Vitals:  Vitals:   12/22/23 1510 12/22/23 1520  BP: (!) 146/62 (!) 169/65  Pulse: 70 69  Resp: 16 17  Temp:    SpO2: 94% 96%    Last Pain:  Vitals:   12/22/23 1520  TempSrc:   PainSc: 0-No pain                 Garnette FORBES Skillern

## 2023-12-22 NOTE — Op Note (Signed)
 [facility] Patient Name: Monica Kane Procedure Date: 12/22/2023 MRN: [id number] Attending MD: Aloha Finner , MD, [id number] Date of Birth: 18-Nov-1945 CSN: [id number] Age: 78 Admit Type: Outpatient Procedure:                Upper EUS Indications:              Pancreatic cyst on MRCP, Elevated CA 19-9,                            Neuroendocrine Tumor Providers:                Aloha Finner, MD, Jacquelyn Jaci Pierce,                            RN, Olam Riedel, RN, Lorrayne Kitty, Technician Referring MD:              Medicines:                Monitored Anesthesia Care, Cipro  400 mg IV Complications:            No immediate complications. Estimated Blood Loss:     Estimated blood loss was minimal. Procedure:                Pre-Anesthesia Assessment:                           - Prior to the procedure, a History and Physical                            was performed, and patient medications and                            allergies were reviewed. The patient's tolerance of                            previous anesthesia was also reviewed. The risks                            and benefits of the procedure and the sedation                            options and risks were discussed with the patient.                            All questions were answered, and informed consent                            was obtained. Prior Anticoagulants: The patient has                            taken Xarelto  (rivaroxaban ), last dose was 2 days                            prior to procedure. ASA Grade Assessment: III - A  patient with severe systemic disease. After                            reviewing the risks and benefits, the patient was                            deemed in satisfactory condition to undergo the                            procedure.                           After obtaining informed consent, the endoscope was                            passed  under direct vision. Throughout the                            procedure, the patient's blood pressure, pulse, and                            oxygen saturations were monitored continuously. The                            [id number]-H190 ([id number]) Olympus endoscope was introduced                            through the mouth, and advanced to the second part                            of duodenum. The [id number]-[id number] ([id number]) Olympus                            duodenoscope was introduced through the mouth, and                            advanced to the area of papilla. The GF-UE160-AL5                            ([id number]) Olympus endosonoscope was introduced                            through the mouth, and advanced to the duodenum for                            ultrasound examination from the stomach and                            duodenum. The [id number] ([id number]) Olympus                            ultrasound scope was introduced through the mouth,  and advanced to the duodenum for ultrasound                            examination from the stomach and duodenum. The                            upper EUS was accomplished without difficulty. The                            patient tolerated the procedure. Scope In: Scope Out: Findings:      ENDOSCOPIC FINDING: :      The examined esophagus was mildly tortuous.      LA Grade A (one or more mucosal breaks less than 5 mm, not extending       between tops of 2 mucosal folds) esophagitis with no bleeding was found       in the distal esophagus.      The Z-line was irregular and was found 35 cm from the incisors.      A 2 cm hiatal hernia was present.      Multiple dispersed small erosions with no bleeding and no stigmata of       recent bleeding were found at the incisura, in the gastric antrum and in       the prepyloric region of the stomach.      No other gross lesions were noted in the entire examined stomach.       Biopsies were  taken with a cold forceps for histology and Helicobacter       pylori testing.      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 Biopsies       were taken with a cold forceps for histology.      The major papilla was normal. [place] deformity however was noted with       mucous pooling out of the pancreatic orifice.      No other gross lesions were noted in the duodenal bulb, in the first       portion of the duodenum and in the second portion of the duodenum.      ENDOSONOGRAPHIC FINDING: :      Multicystic lesions suggestive of multiple cysts were identified in the       genu of the pancreas and pancreatic tail. The first cyst in the neck       measured 19 mm by 22 mm. The second cyst in the tail measured 23 mm by       18 mm. There was no associated mass or intramural nodularity was found.       Diagnostic needle aspiration for fluid was performed. Color Doppler       imaging was utilized prior to needle puncture to confirm a lack of       significant vascular structures within the needle path. One pass was       made with the 22 gauge needle using a transgastric approach. A stylet       was used. The amount of fluid collected was 2 mL. The fluid was cloudy,       serous and slightly viscous. Sample(s) were sent for glucose, amylase       concentration and CEA. Diagnostic needle aspiration for fluid was       performed. Color Doppler  imaging was utilized prior to needle puncture       to confirm a lack of significant vascular structures within the needle       path. One pass was made with the 22 gauge needle using a transgastric       approach. A stylet was used. The amount of fluid collected was 0.9 mL.       The fluid was clear, white and slightly viscous. Sample(s) were sent for       glucose, amylase concentration and CEA.      Pancreatic parenchymal abnormalities were noted in the entire pancreas.       These consisted of hyperechoic strands. The pancreatic duct  was not       noted to be dilated throughout.      There was no sign of significant endosonographic abnormality in the       common bile duct and in the common hepatic duct (no ductal dilation). An       unremarkable gallbladder was identified.      Endosonographic imaging of the ampulla showed no intramural       (subepithelial) lesion.      Endosonographic imaging in the visualized portion of the liver showed no       mass.      No malignant-appearing lymph nodes were visualized in the gastrohepatic       ligament (level 18), celiac region (level 20), peripancreatic region and       porta hepatis region.      The celiac region was visualized. Impression:               EGD impression:                           - Tortuous esophagus.                           - LA Grade A esophagitis with no bleeding found                            distally.                           - Z-line irregular, 35 cm from the incisors.                           - 2 cm hiatal hernia.                           - Erosive gastropathy with no bleeding and no                            stigmata of recent bleeding found in the                            incisura/antrum/prepylorus. No other gross lesions                            in the entire stomach. Biopsied.                           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                            Biopsied.                           - Normal major papilla, with fishmouth deformity                            noted and mucus spillage from papillary pancreas                            orifice.                           - No other gross lesions in the duodenal bulb, in                            the first portion of the duodenum and in the second                            portion of the duodenum.                           EUS impression:                           - Multiple cystic lesions were seen in the genu of                            the pancreas  and pancreatic tail being the largest.                            Tissue has not been obtained. However, the                            endosonographic appearance is consistent with a                            branched intraductal papillary mucinous neoplasm.                            Fine needle aspiration for fluid performed of each                            cyst separately.                           - Pancreatic parenchymal abnormalities consisting                            of hyperechoic strands were noted in the entire                            pancreas. No evidence of pancreatic ductal dilation.                           -  There was no sign of significant pathology in the                            common bile duct and in the common hepatic duct.                            Gallbladder was unremarkable.                           - No malignant-appearing lymph nodes were                            visualized in the gastrohepatic ligament (level                            18), celiac region (level 20), peripancreatic                            region and porta hepatis region. Moderate Sedation:      Not Applicable - Patient had care per Anesthesia. Recommendation:           - The patient will be observed post-procedure,                            until all discharge criteria are met.                           - Discharge patient to home.                           - Patient has a contact number available for                            emergencies. The signs and symptoms of potential                            delayed complications were discussed with the                            patient. Return to normal activities tomorrow.                            Written discharge instructions were provided to the                            patient.                           - Low fat diet.                           - Observe patient's clinical course.                           -  Ciprofloxacin  500 mg twice daily x 3 days to  decrease risk of post interventional infection.                           - Xarelto  restart on 9/6 to decrease risk of                            posterior ventricle bleeding.                           - PPI 40 mg twice daily to be initiated x 2 months                            and then once daily dosing.                           - Await path results.                           - The findings and recommendations were discussed                            with the patient.                           - The findings and recommendations were discussed                            with the patient's family. Procedure Code(s):        --- Professional ---                           [phone], Esophagogastroduodenoscopy, flexible,                            transoral; with transendoscopic ultrasound-guided                            intramural or transmural fine needle                            aspiration/biopsy(s), (includes endoscopic                            ultrasound examination limited to the esophagus,                            stomach or duodenum, and adjacent structures)                           43239, 59, Esophagogastroduodenoscopy, flexible,                            transoral; with biopsy, single or multiple Diagnosis Code(s):        --- Professional ---                           Q39.9,  Congenital malformation of esophagus,                            unspecified                           K20.90, Esophagitis, unspecified without bleeding                           K22.89, Other specified disease of esophagus                           K44.9, Diaphragmatic hernia without obstruction or                            gangrene                           K31.89, Other diseases of stomach and duodenum                           K86.2, Cyst of pancreas                           K86.9, Disease of pancreas, unspecified                            I89.9, Noninfective disorder of lymphatic vessels                            and lymph nodes, unspecified                           R97.8, Other abnormal tumor markers CPT copyright 2022 [employer]. All rights reserved. The codes documented in this report are preliminary and upon coder review may  be revised to meet current compliance requirements. Aloha Finner, MD 12/22/2023 3:49:10 PM Number of Addenda: 0

## 2023-12-22 NOTE — Anesthesia Procedure Notes (Signed)
 Procedure Name: MAC Date/Time: 12/22/2023 1:52 PM  Performed by: Nada Corean CROME, CRNAPre-anesthesia Checklist: Emergency Drugs available, Patient identified, Patient being monitored, Suction available and Timeout performed Patient Re-evaluated:Patient Re-evaluated prior to induction Oxygen Delivery Method: Simple face mask Induction Type: IV induction Placement Confirmation: positive ETCO2 Dental Injury: Teeth and Oropharynx as per pre-operative assessment  Comments: POM mask used

## 2023-12-24 ENCOUNTER — Other Ambulatory Visit: Payer: Self-pay | Admitting: Family Medicine

## 2023-12-25 ENCOUNTER — Encounter (HOSPITAL_COMMUNITY): Payer: Self-pay | Admitting: Gastroenterology

## 2023-12-26 ENCOUNTER — Telehealth: Payer: Self-pay | Admitting: Gastroenterology

## 2023-12-26 ENCOUNTER — Other Ambulatory Visit (HOSPITAL_COMMUNITY): Payer: Self-pay

## 2023-12-26 ENCOUNTER — Ambulatory Visit: Payer: Self-pay | Admitting: Gastroenterology

## 2023-12-26 ENCOUNTER — Telehealth: Payer: Self-pay

## 2023-12-26 LAB — SURGICAL PATHOLOGY

## 2023-12-26 NOTE — Telephone Encounter (Signed)
 Pharmacy Patient Advocate Encounter   Received notification from CoverMyMeds that prior authorization for Esomeprazole  Magnesium  40MG  dr capsules is required/requested.   Insurance verification completed.   The patient is insured through [employer].   Per test claim: PA required; PA submitted to above mentioned insurance via Latent Key/confirmation #/EOC [id number] Status is pending

## 2023-12-26 NOTE — Telephone Encounter (Signed)
 Approval notice 8/5-8/25

## 2023-12-27 NOTE — Telephone Encounter (Signed)
 Noted the pt has been advised

## 2023-12-27 NOTE — Telephone Encounter (Signed)
 Pharmacy Patient Advocate Encounter  Received notification from [facility] [employer] that Prior Authorization for Esomeprazole  Magnesium  40MG  dr capsules has been APPROVED from 12-26-2023 to 12-25-2024   PA #/Case ID/Reference #: [id number]

## 2023-12-29 DIAGNOSIS — D490 Neoplasm of unspecified behavior of digestive system: Secondary | ICD-10-CM | POA: Diagnosis not present

## 2023-12-29 DIAGNOSIS — R7989 Other specified abnormal findings of blood chemistry: Secondary | ICD-10-CM | POA: Diagnosis not present

## 2023-12-31 NOTE — Telephone Encounter (Signed)
 Noted

## 2024-01-04 DIAGNOSIS — D490 Neoplasm of unspecified behavior of digestive system: Secondary | ICD-10-CM | POA: Diagnosis not present

## 2024-01-04 DIAGNOSIS — R7989 Other specified abnormal findings of blood chemistry: Secondary | ICD-10-CM | POA: Diagnosis not present

## 2024-01-05 ENCOUNTER — Telehealth: Payer: Self-pay | Admitting: Gastroenterology

## 2024-01-05 ENCOUNTER — Encounter: Payer: Self-pay | Admitting: Gastroenterology

## 2024-01-05 NOTE — Progress Notes (Unsigned)
 [facility] pancreatic fluid analysis  Pancreatic Cyst 1 Amylase 2258 units/L CEA 3549 ng/mL Glucose 4 mg/dL  These findings are most consistent with an IPMN.  This is a premalignant cyst.  This is similar to the findings previously.  Pancreatic Cyst 2 Results not available and we are working on retrieving.  Will forward these results to Dr. Dasie so she is aware and can determine next steps as well.  I called and discussed this with the patient and she says that she has recently had a discussion with Dr. Dasie as well.  Tentative plan is follow-up laboratories and then follow-up imaging MRI wise per her report.  I will discuss this formally with Dr. Dasie.   Aloha Finner, MD [facility] Office # [phone]

## 2024-01-05 NOTE — Telephone Encounter (Signed)
 Spoke with the representative and she needs additional samples from recent EUS hospital case on 12/22/23.  I have made her aware that they would need to speak with endo unit at [facility]. I have provided her with the number to call for assistance.

## 2024-01-05 NOTE — Telephone Encounter (Signed)
 Received a call from [facility] requesting fu call to discuss order confirmations. [id number], [id number] reference number. Please review and advise   Thank you

## 2024-01-09 NOTE — Progress Notes (Signed)
Got it. °GM °

## 2024-01-11 ENCOUNTER — Other Ambulatory Visit: Payer: Self-pay | Admitting: *Deleted

## 2024-01-11 ENCOUNTER — Encounter: Payer: Self-pay | Admitting: Gastroenterology

## 2024-01-11 NOTE — Progress Notes (Signed)
 The proposed treatment discussed in conference is for discussion purpose only and is not a binding recommendation.  The patients have not been physically examined, or presented with their treatment options.  Therefore, final treatment plans cannot be decided.

## 2024-01-12 ENCOUNTER — Other Ambulatory Visit: Payer: Self-pay | Admitting: Surgery

## 2024-01-12 DIAGNOSIS — Z809 Family history of malignant neoplasm, unspecified: Secondary | ICD-10-CM

## 2024-01-19 DIAGNOSIS — M545 Low back pain, unspecified: Secondary | ICD-10-CM | POA: Diagnosis not present

## 2024-01-19 DIAGNOSIS — M1611 Unilateral primary osteoarthritis, right hip: Secondary | ICD-10-CM | POA: Diagnosis not present

## 2024-01-19 DIAGNOSIS — R0781 Pleurodynia: Secondary | ICD-10-CM | POA: Diagnosis not present

## 2024-01-24 DIAGNOSIS — Z23 Encounter for immunization: Secondary | ICD-10-CM | POA: Diagnosis not present

## 2024-02-02 DIAGNOSIS — Z23 Encounter for immunization: Secondary | ICD-10-CM | POA: Diagnosis not present

## 2024-02-08 ENCOUNTER — Encounter: Payer: Self-pay | Admitting: Genetic Counselor

## 2024-02-09 ENCOUNTER — Other Ambulatory Visit: Payer: Self-pay | Admitting: Genetic Counselor

## 2024-02-09 ENCOUNTER — Inpatient Hospital Stay: Attending: Hematology & Oncology | Admitting: Genetic Counselor

## 2024-02-09 ENCOUNTER — Inpatient Hospital Stay: Attending: Hematology & Oncology

## 2024-02-09 ENCOUNTER — Encounter: Payer: Self-pay | Admitting: Genetic Counselor

## 2024-02-09 DIAGNOSIS — Z1379 Encounter for other screening for genetic and chromosomal anomalies: Secondary | ICD-10-CM | POA: Diagnosis not present

## 2024-02-09 DIAGNOSIS — Z803 Family history of malignant neoplasm of breast: Secondary | ICD-10-CM

## 2024-02-09 DIAGNOSIS — D3A8 Other benign neuroendocrine tumors: Secondary | ICD-10-CM

## 2024-02-09 DIAGNOSIS — K862 Cyst of pancreas: Secondary | ICD-10-CM | POA: Diagnosis not present

## 2024-02-09 DIAGNOSIS — Z800 Family history of malignant neoplasm of digestive organs: Secondary | ICD-10-CM

## 2024-02-09 DIAGNOSIS — I2693 Single subsegmental pulmonary embolism without acute cor pulmonale: Secondary | ICD-10-CM

## 2024-02-09 LAB — CBC WITH DIFFERENTIAL (CANCER CENTER ONLY)
Abs Immature Granulocytes: 0.02 K/uL (ref 0.00–0.07)
Basophils Absolute: 0 K/uL (ref 0.0–0.1)
Basophils Relative: 1 %
Eosinophils Absolute: 0.1 K/uL (ref 0.0–0.5)
Eosinophils Relative: 2 %
HCT: 38.7 % (ref 36.0–46.0)
Hemoglobin: 12.5 g/dL (ref 12.0–15.0)
Immature Granulocytes: 0 %
Lymphocytes Relative: 32 %
Lymphs Abs: 1.6 K/uL (ref 0.7–4.0)
MCH: 26.2 pg (ref 26.0–34.0)
MCHC: 32.3 g/dL (ref 30.0–36.0)
MCV: 81.1 fL (ref 80.0–100.0)
Monocytes Absolute: 0.3 K/uL (ref 0.1–1.0)
Monocytes Relative: 5 %
Neutro Abs: 3 K/uL (ref 1.7–7.7)
Neutrophils Relative %: 60 %
Platelet Count: 156 K/uL (ref 150–400)
RBC: 4.77 MIL/uL (ref 3.87–5.11)
RDW: 12.7 % (ref 11.5–15.5)
WBC Count: 5 K/uL (ref 4.0–10.5)
nRBC: 0 % (ref 0.0–0.2)

## 2024-02-09 LAB — CMP (CANCER CENTER ONLY)
ALT: 15 U/L (ref 0–44)
AST: 17 U/L (ref 15–41)
Albumin: 4 g/dL (ref 3.5–5.0)
Alkaline Phosphatase: 71 U/L (ref 38–126)
Anion gap: 5 (ref 5–15)
BUN: 19 mg/dL (ref 8–23)
CO2: 29 mmol/L (ref 22–32)
Calcium: 9.3 mg/dL (ref 8.9–10.3)
Chloride: 107 mmol/L (ref 98–111)
Creatinine: 1.08 mg/dL — ABNORMAL HIGH (ref 0.44–1.00)
GFR, Estimated: 53 mL/min — ABNORMAL LOW (ref >60)
Glucose, Bld: 174 mg/dL — ABNORMAL HIGH (ref 70–99)
Potassium: 4.2 mmol/L (ref 3.5–5.1)
Sodium: 141 mmol/L (ref 135–145)
Total Bilirubin: 0.5 mg/dL (ref 0.0–1.2)
Total Protein: 6.4 g/dL — ABNORMAL LOW (ref 6.5–8.1)

## 2024-02-09 LAB — GENETIC SCREENING ORDER

## 2024-02-09 NOTE — Progress Notes (Signed)
 REFERRING PROVIDER: Dasie Leonor CROME, MD [address] [place],  [place] [address]  PRIMARY PROVIDER:  Watt Harlene BROCKS, MD  PRIMARY REASON FOR VISIT:  1. Family history of breast cancer   2. Pancreatic cyst   3. 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 Schou, a 78 y.o. female, was seen for a [facility] cancer genetics consultation at the request of Dr. Dasie due to a personal and family history of cancer.  Ms. Koval presents to clinic today to discuss the possibility of a hereditary predisposition to cancer, genetic testing, and to further clarify her future cancer risks, as well as potential cancer risks for family members.   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  In 2025, at the age of 32, Ms. Stene was diagnosed with a neuroendocrine tumor of the duodenum. The treatment plan was removal and it has not caused problems.  AT that time, she was found to have an IPMN that has been followed.    CANCER HISTORY:  Oncology History   No history exists.     RISK FACTORS:  Menarche was at age 4.  First live birth at age N/A.  OCP use for approximately < 5 years.  Ovaries intact: no.  Hysterectomy: yes.  Menopausal status: postmenopausal.  HRT use: < 1 years. Colonoscopy: yes; normal. Mammogram within the last year: yes. Number of breast biopsies: 2. Up to date with pelvic exams: n/a. Any excessive radiation exposure in the past: no  Past Medical History:  Diagnosis Date   Allergy    Rash, some swelling on face   Anxiety    takes Ativan nightly   Depression    takes Zoloft  daily   Family history of breast cancer    Family history of pancreatic cancer    GERD (gastroesophageal reflux disease) March 2024   History of colon polyps    History of kidney stones    History of staph infection 11/2013   Hyperlipidemia    takes Crestor  nightly    Hypertension    takes  Lisinopril  and Coreg  daily   Joint pain    Joint swelling    Neuropathy    takes Elavil  nightly   Pneumonia    Pre-diabetes    Primary localized osteoarthritis of right knee    Pulmonary embolism (HCC) 07/08/2021   Sleep apnea    uses oral appliance    Past Surgical History:  Procedure Laterality Date   ABDOMINAL HYSTERECTOMY  04/20/1987   APPENDECTOMY     BIOPSY  05/13/2022   Procedure: BIOPSY;  Surgeon: Wilhelmenia Aloha Raddle., MD;  Location: [facility];  Service: Gastroenterology;;   BREAST BIOPSY Left 02/16/2016   BREAST EXCISIONAL BIOPSY Right 2008   CARDIAC CATHETERIZATION  04/19/2013   CERVICAL SPINE SURGERY  04/19/2002   C 5-6   COLONOSCOPY     colonscopy     DIAGNOSTIC LAPAROSCOPY  04/19/1974   fibroid removed   ELBOW ARTHROSCOPY  2010, 2012   ESOPHAGOGASTRODUODENOSCOPY N/A 12/22/2023   Procedure: EGD (ESOPHAGOGASTRODUODENOSCOPY);  Surgeon: Wilhelmenia Aloha Raddle., MD;  Location: [facility];  Service: Gastroenterology;  Laterality: N/A;   ESOPHAGOGASTRODUODENOSCOPY (EGD) WITH PROPOFOL  N/A 05/13/2022   Procedure: ESOPHAGOGASTRODUODENOSCOPY (EGD) WITH PROPOFOL ;  Surgeon: Wilhelmenia Aloha Raddle., MD;  Location: [facility];  Service: Gastroenterology;  Laterality: N/A;   ESOPHAGOGASTRODUODENOSCOPY (EGD) WITH PROPOFOL  N/A 07/15/2022   Procedure:  ESOPHAGOGASTRODUODENOSCOPY (EGD) WITH PROPOFOL ;  Surgeon: Mansouraty, Aloha Raddle., MD;  Location: [facility];  Service: Gastroenterology;  Laterality: N/A;   EUS N/A 07/15/2022   Procedure: UPPER ENDOSCOPIC ULTRASOUND (EUS) RADIAL;  Surgeon: Wilhelmenia Aloha Raddle., MD;  Location: [facility];  Service: Gastroenterology;  Laterality: N/A;   EUS N/A 12/22/2023   Procedure: ULTRASOUND, UPPER GI TRACT, ENDOSCOPIC;  Surgeon: Wilhelmenia Aloha Raddle., MD;  Location: [facility];  Service: Gastroenterology;  Laterality: N/A;   EYE SURGERY Bilateral    cataract   FINE NEEDLE ASPIRATION N/A 05/13/2022   Procedure: FINE NEEDLE  ASPIRATION (FNA) LINEAR;  Surgeon: Wilhelmenia Aloha Raddle., MD;  Location: [facility];  Service: Gastroenterology;  Laterality: N/A;   FINE NEEDLE ASPIRATION  12/22/2023   Procedure: FINE NEEDLE ASPIRATION;  Surgeon: Wilhelmenia Aloha Raddle., MD;  Location: [facility];  Service: Gastroenterology;;   FOOT SURGERY Left 04/20/2011   GASTROCNEMIUS RECESSION Right 10/09/2019   Procedure: RIGHT GASTROCNEMIUS RECESSION;  Surgeon: Harden Jerona GAILS, MD;  Location: [facility];  Service: Orthopedics;  Laterality: Right;   HEMOSTASIS CLIP PLACEMENT  07/15/2022   Procedure: HEMOSTASIS CLIP PLACEMENT;  Surgeon: Wilhelmenia Aloha Raddle., MD;  Location: [facility];  Service: Gastroenterology;;   HYSTEROTOMY  1989   JOINT REPLACEMENT  01/2014   Right knee   KNEE ARTHROSCOPY  01/17/2013   KNEE ARTHROSCOPY W/ MENISCECTOMY Right 01/31/2013   [facility]   LATERAL EPICONDYLE RELEASE  03/01/2011   Procedure: TENNIS ELBOW RELEASE;  Surgeon: Lamar DELENA Millman, MD;  Location: [facility];  Service: Orthopedics;  Laterality: Left;  TENOTOMY ELBOW LATERAL EPICONDYLITIS TENNIS ELBOW   lateral release right elbow  04/19/2008   left foot surgery  04/20/2011   mortons neuroma   LEFT HEART CATHETERIZATION WITH CORONARY ANGIOGRAM N/A 07/23/2013   Procedure: LEFT HEART CATHETERIZATION WITH CORONARY ANGIOGRAM;  Surgeon: Victory LELON Claudene DOUGLAS, MD;  Location: [facility];  Service: Cardiovascular;  Laterality: N/A;   PAROTID GLAND TUMOR EXCISION  04/19/1974   benign   PAROTID GLAND TUMOR EXCISION  04/19/1974   rt   SHOULDER ARTHROSCOPY DISTAL CLAVICLE EXCISION AND OPEN ROTATOR CUFF REPAIR  04/19/2004   SHOULDER ARTHROSCOPY W/ ROTATOR CUFF REPAIR  04/19/2004   SUBMUCOSAL LIFTING INJECTION  07/15/2022   Procedure: SUBMUCOSAL LIFTING INJECTION;  Surgeon: Wilhelmenia Aloha Raddle., MD;  Location: [facility];  Service: Gastroenterology;;   TOTAL KNEE ARTHROPLASTY Right 02/18/2014   Procedure: TOTAL KNEE  ARTHROPLASTY;  Surgeon: Lamar DELENA Millman, MD;  Location: [facility];  Service: Orthopedics;  Laterality: Right;   UPPER ESOPHAGEAL ENDOSCOPIC ULTRASOUND (EUS) N/A 05/13/2022   Procedure: UPPER ESOPHAGEAL ENDOSCOPIC ULTRASOUND (EUS);  Surgeon: Wilhelmenia Aloha Raddle., MD;  Location: [facility];  Service: Gastroenterology;  Laterality: N/A;   WEIL OSTEOTOMY Right 10/09/2019   Procedure: WEIL OSTEOTOMY RIGHT 2ND METATARSAL;  Surgeon: Harden Jerona GAILS, MD;  Location: [facility];  Service: Orthopedics;  Laterality: Right;    Social History   Socioeconomic History   Marital status: Married    Spouse name: Peyten Punches   Number of children: Not on file   Years of education: Not on file   Highest education level: Bachelor's degree (e.g., BA, AB, BS)  Occupational History   Not on file  Tobacco Use   Smoking status: Never   Smokeless tobacco: Never   Tobacco comments:    Both parents smoked and died of lung cancer  Vaping Use   Vaping status: Never Used  Substance and Sexual Activity  Alcohol use: Yes    Comment: Occasionally on weekends   Drug use: Never   Sexual activity: Not Currently    Birth control/protection: Surgical  Other Topics Concern   Not on file  Social History Narrative   Lives with her husband.  He has 2 adult children. Patient has a Naval architect, and she does exercise.   Social Drivers of Corporate investment banker Strain: Low Risk  (10/18/2023)   Overall Financial Resource Strain (CARDIA)    Difficulty of Paying Living Expenses: Not hard at all  Food Insecurity: No Food Insecurity (10/18/2023)   Hunger Vital Sign    Worried About Running Out of Food in the Last Year: Never true    Ran Out of Food in the Last Year: Never true  Transportation Needs: No Transportation Needs (10/25/2023)   PRAPARE - Administrator, Civil Service (Medical): No    Lack of Transportation (Non-Medical): No  Physical Activity: Sufficiently Active (10/18/2023)    Exercise Vital Sign    Days of Exercise per Week: 3 days    Minutes of Exercise per Session: 150+ min  Stress: Stress Concern Present (10/18/2023)   [employer] of Occupational Health - Occupational Stress Questionnaire    Feeling of Stress: To some extent  Social Connections: Moderately Isolated (10/18/2023)   Social Connection and Isolation Panel    Frequency of Communication with Friends and Family: Once a week    Frequency of Social Gatherings with Friends and Family: Three times a week    Attends Religious Services: Never    Active Member of Clubs or Organizations: No    Attends Banker Meetings: Never    Marital Status: Married     FAMILY HISTORY:  We obtained a detailed, 4-generation family history.  Significant diagnoses are listed below: Family History  Problem Relation Age of Onset   Diabetes Mother    Heart disease Mother    Hypertension Mother    Kidney disease Mother    Lung cancer Mother    Lung cancer Father        + tobacco   Alcohol abuse Father    Kidney disease Sister    Colon polyps Sister    Diabetes Sister    Hypertension Sister    Hyperlipidemia Sister    Kidney failure Sister    Depression Sister    Breast cancer Sister 30   Early death Sister    Pancreatic cancer Maternal Aunt    Leukemia Paternal Uncle        ?CLL   Heart disease Maternal Grandfather        heart attack   Heart disease Paternal Grandfather        heart attack   Colon cancer Nephew    Pancreatic cancer Cousin        d. 22   Esophageal cancer Neg Hx    Inflammatory bowel disease Neg Hx    Liver disease Neg Hx    Rectal cancer Neg Hx    Stomach cancer Neg Hx    BRCA 1/2 Neg Hx      The patient does not have children.  She has two sisters, one had breast cancer at 67.  The other sister has a son who died of colon cancer at 41.  Both parents died of lung cancer.  The patient's mother had one sister and three brothers.  The sister had pancreatic cancer and  one brother had a daughter who had  pancreatic cancer.  The patient's father had three brothers, two had leukemia,.    Ms. Giannotti is unaware of previous family history of genetic testing for hereditary cancer risks. There is no reported Ashkenazi Jewish ancestry. There is no known consanguinity.  GENETIC COUNSELING ASSESSMENT: Ms. Boehning is a 78 y.o. female with a personal and family history of cancer which is somewhat suggestive of a hereditary cancer syndrome and predisposition to cancer given the combination of cancer in the family. We, therefore, discussed and recommended the following at today's visit.   DISCUSSION: We discussed that, in general, most cancer is not inherited in families, but instead is sporadic or familial. Sporadic cancers occur by chance and typically happen at older ages (>50 years) as this type of cancer is caused by genetic changes acquired during an individual's lifetime. Some families have more cancers than would be expected by chance; however, the ages or types of cancer are not consistent with a known genetic mutation or known genetic mutations have been ruled out. This type of familial cancer is thought to be due to a combination of multiple genetic, environmental, hormonal, and lifestyle factors. While this combination of factors likely increases the risk of cancer, the exact source of this risk is not currently identifiable or testable.  We discussed that up to 15% of pancreatic cancer is hereditary, with most cases associated with BRCA mutations.  There are other genes that can be associated with hereditary pancreatic cancer syndromes.  These include ATM, CDKN2A and PALB2.  We discussed that pancreatic cysts can increase the risk for pancreatic cancer and therefore we are concerned about those genes  We discussed that testing is beneficial for several reasons including knowing how to follow individuals after completing their treatment, identifying whether potential  treatment options such as PARP inhibitors would be beneficial, and understand if other family members could be at risk for cancer and allow them to undergo genetic testing.   We reviewed the characteristics, features and inheritance patterns of hereditary cancer syndromes. We also discussed genetic testing, including the appropriate family members to test, the process of testing, insurance coverage and turn-around-time for results. We discussed the implications of a negative, positive, carrier and/or variant of uncertain significant result. Ms. Esteve  was offered a common hereditary cancer panel (36+ genes) and an expanded pan-cancer panel (70+ genes). Ms. Wardrop was informed of the benefits and limitations of each panel, including that expanded pan-cancer panels contain genes that do not have clear management guidelines at this point in time.  We also discussed that as the number of genes included on a panel increases, the chances of variants of uncertain significance increases. Ms. Kinnamon decided to pursue genetic testing for the CancerNext-Expanded+RNAinsight gene panel.   The CancerNext-Expanded gene panel offered by [facility] and includes sequencing, rearrangement, and RNA analysis for the following 77 genes: AIP, ALK, APC, ATM, BAP1, BARD1, BMPR1A, BRCA1, BRCA2, BRIP1, CDC73, CDH1, CDK4, CDKN1B, CDKN2A, CEBPA, CHEK2, CTNNA1, DDX41, DICER1, ETV6, FH, FLCN, GATA2, LZTR1, MAX, MBD4, MEN1, MET, MLH1, MSH2, MSH3, MSH6, MUTYH, NF1, NF2, NTHL1, PALB2, PHOX2B, PMS2, POT1, PRKAR1A, PTCH1, PTEN, RAD51C, RAD51D, RB1, RET, RPS20, RUNX1, SDHA, SDHAF2, SDHB, SDHC, SDHD, SMAD4, SMARCA4, SMARCB1, SMARCE1, STK11, SUFU, TMEM127, TP53, TSC1, TSC2, VHL, and WT1 (sequencing and deletion/duplication); AXIN2, CTNNA1, DDX41, EGFR, HOXB13, KIT, MBD4, MITF, MSH3, PDGFRA, POLD1 and POLE (sequencing only); EPCAM and GREM1 (deletion/duplication only). RNA data is routinely analyzed for use in variant interpretation for all  genes.   Based on Ms.  Markos's personal and family history of cancer, she meets medical criteria for genetic testing. Despite that she meets criteria, she may still have an out of pocket cost. We discussed that if her out of pocket cost for testing is over $100, the laboratory will call and confirm whether she wants to proceed with testing.  If the out of pocket cost of testing is less than $100 she will be billed by the genetic testing laboratory.   We discussed that some people do not want to undergo genetic testing due to fear of genetic discrimination.  The Genetic Information Nondiscrimination Act (GINA) was signed into federal law in 2008. GINA prohibits health insurers and most employers from discriminating against individuals based on genetic information (including the results of genetic tests and family history information). According to GINA, health insurance companies cannot consider genetic information to be a preexisting condition, nor can they use it to make decisions regarding coverage or rates. GINA also makes it illegal for most employers to use genetic information in making decisions about hiring, firing, promotion, or terms of employment. It is important to note that GINA does not offer protections for life insurance, disability insurance, or long-term care insurance. GINA does not apply to those in the [employer], those who work for companies with less than 15 employees, and new life insurance or long-term disability insurance policies.  Health status due to a cancer diagnosis is not protected under GINA. More information about GINA can be found by visiting [email or web address].  PLAN: After considering the risks, benefits, and limitations, Ms. Luton provided informed consent to pursue genetic testing and the blood sample was sent to [facility] for analysis of the CancerNext-Expanded+RNAinsight. Results should be available within approximately 2-3 weeks' time, at which point  they will be disclosed by telephone to Ms. King, as will any additional recommendations warranted by these results. Ms. Crotwell will receive a summary of her genetic counseling visit and a copy of her results once available. This information will also be available in Epic.   Lastly, we encouraged Ms. Schrupp to remain in contact with cancer genetics annually so that we can continuously update the family history and inform her of any changes in cancer genetics and testing that may be of benefit for this family.   Ms. Levert questions were answered to her satisfaction today. Our contact information was provided should additional questions or concerns arise. Thank you for the referral and allowing us  to share in the care of your patient.   Wyatt Galvan P. Perri, MS, CGC Licensed, Patent attorney [email or web address] phone: [phone]  I personally spent a total of 60 minutes in the care of the patient today including preparing to see the patient, getting/reviewing separately obtained history, counseling and educating, placing orders, and documenting clinical information in the EHR. The patient was seen alone.  Drs. Lanny Stalls, and/or Gudena were available for questions, if needed..    _______________________________________________________________________ For Office Staff:  Number of people involved in session: 1 Was an Intern/ student involved with case: no

## 2024-02-13 DIAGNOSIS — S8011XA Contusion of right lower leg, initial encounter: Secondary | ICD-10-CM | POA: Diagnosis not present

## 2024-02-13 DIAGNOSIS — S81801A Unspecified open wound, right lower leg, initial encounter: Secondary | ICD-10-CM | POA: Diagnosis not present

## 2024-02-13 DIAGNOSIS — Z23 Encounter for immunization: Secondary | ICD-10-CM | POA: Diagnosis not present

## 2024-02-16 DIAGNOSIS — S8011XD Contusion of right lower leg, subsequent encounter: Secondary | ICD-10-CM | POA: Diagnosis not present

## 2024-02-16 DIAGNOSIS — S81801D Unspecified open wound, right lower leg, subsequent encounter: Secondary | ICD-10-CM | POA: Diagnosis not present

## 2024-02-17 ENCOUNTER — Telehealth: Payer: Self-pay | Admitting: Genetic Counselor

## 2024-02-17 ENCOUNTER — Encounter: Payer: Self-pay | Admitting: Genetic Counselor

## 2024-02-17 DIAGNOSIS — Z1379 Encounter for other screening for genetic and chromosomal anomalies: Secondary | ICD-10-CM | POA: Insufficient documentation

## 2024-02-17 NOTE — Telephone Encounter (Signed)
 I contacted  Monica Kane to discuss her genetic testing results. No pathogenic variants were identified in the 77 genes analyzed. Discussed that we do not know why she has a neuroendocrine tumor or pancreatic cysts or why there is cancer in the family. It could be due to a different gene that we are not testing, or maybe our current technology may not be able to pick something up.  It will be important for her to keep in contact with genetics to keep up with whether additional testing may be needed.Detailed clinic note to follow.   The test report will be scanned into EPIC and will be located under the Molecular Pathology section of the Results Review tab.  A portion of the result report is included below for reference.

## 2024-02-17 NOTE — Telephone Encounter (Signed)
 Lm on VM that results were back and to please call.  Left CB instructions.

## 2024-02-19 NOTE — Progress Notes (Addendum)
 [facility] [address], [address] [place], [place] [address] [phone]  Date:  02/22/2024   Name:  Monica Kane   DOB:  [birth year]   MRN:  [id number]  PCP:  Watt Harlene JAYSON, MD    Chief Complaint: Leg Pain (R leg sore Onset about a week ago, I didn't go to the doctor until it was too late for stitches and it just keeps bleeding. )   History of Present Illness:  Monica Kane is a 78 y.o. very pleasant female patient who presents with the following:  Pt seen today with a wound on her right leg I saw her last in July  History of hypertension, diabetes, hyperlipidemia, sleep apnea treated with oral appliance, vitamin D  deficiency, neuroendocrine tumor of the duodenum diagnosed in 2024 In February 2023 she also tested positive for pulmonary embolism in the setting of acute illness, suspected COVID though she tested negative.   Most recent visit with hematology was in [place] is currently on maintenance Xarelto , 10 mg daily.  Plan for long-term anticoagulation Lab Results  Component Value Date   HGBA1C 7.3 (H) 07/04/2023   Foot exam Flu shot- done  Covid booster- done  A1c can be updated- update today  Urine micro can be updated   Discussed the use of AI scribe software for clinical note transcription with the patient, who gave verbal consent to proceed.  History of Present Illness Monica Kane is a 78 year old female who presents with a leg injury and follow-up on her A1c.  She sustained a leg injury approximately ten days ago when her brother-in-law accidentally kicked a heavy topiary towards her, resulting in a skin tear. The wound bled significantly due to her use of blood thinners and continues to bleed. The skin tear has not been sutured, and she has been managing it with home care, including washing with soap and water. She also went to urgent care the day after and received four days of antibiotics  and a tetanus shot following the injury. The wound is described as still raw and painful, with slow scab formation.  She would like to check her A1c today  She has a history of pancreatic cysts, which were evaluated with an endoscopic ultrasound. The cysts have shown minimal growth, and samples sent to the [facility] indicated no signs of cancer. Her tumor markers are not elevated, but there is concern about their upward trend.  She has been experiencing difficulty with speech and word finding, particularly when talking to more than one person. She describes 'stumbling over words' and having trouble forming the right words, which feels different from typical age-related memory issues. She is not otherwise concerned about her memory but this difficulty with speaking and word finding is concerning  Her current medications include blood thinners and she has been prescribed Fosamax , which she rarely takes. She has received vaccinations for flu, COVID-19, RSV, and possibly pneumonia.  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) 12/22/2023   Change in stool caliber 12/17/2022   Constipation 12/17/2022   Distended abdomen  12/17/2022   Change in bowel habits 12/17/2022   Bloating 12/17/2022   Neuroendocrine tumor (HCC) 12/17/2022   Pancreatic cyst 03/31/2022   Elevated CA 19-9 level 03/31/2022   Family history of malignant neoplasm of pancreas 03/31/2022   Hx of adenomatous colonic polyps 03/31/2022   Chronic anticoagulation 03/31/2022   IPMN (intraductal papillary mucinous neoplasm) 02/10/2022   Pulmonary embolism (HCC) 07/08/2021   Acquired claw toe, right    Achilles tendon  contracture, right    OSA (obstructive sleep apnea) 07/26/2019   Vertigo 10/03/2018   Visual blurriness 10/03/2018   Controlled type 2 diabetes mellitus without complication, without long-term current use of insulin (HCC) 06/05/2015   Primary localized osteoarthritis of right knee    Chest pain, midsternal 07/23/2013   Osteopenia 03/23/2013   Kidney stones, calcium  oxalate 03/23/2013   Hypertension 03/31/2011   Hyperlipidemia 03/31/2011   Vitamin D  deficiency 03/31/2011   Hallux rigidus 03/24/2011   Metatarsalgia of left foot 03/24/2011   Lateral epicondylitis of left elbow 02/24/2011   Migraine 02/24/2011   Arthritis 02/24/2011    Past Medical History:  Diagnosis Date   Allergy    Rash, some swelling on face   Anxiety    takes Ativan nightly   Depression    takes Zoloft  daily   Family history of breast cancer    Family history of pancreatic cancer    GERD (gastroesophageal reflux disease) March 2024   History of colon polyps    History of kidney stones    History of staph infection 11/2013   Hyperlipidemia    takes Crestor  nightly    Hypertension    takes Lisinopril  and Coreg  daily   Joint pain    Joint swelling    Neuropathy    takes Elavil  nightly   Pneumonia    Pre-diabetes    Primary localized osteoarthritis of right knee    Pulmonary embolism (HCC) 07/08/2021   Sleep apnea    uses oral appliance    Past Surgical History:  Procedure Laterality Date   ABDOMINAL HYSTERECTOMY  04/20/1987   APPENDECTOMY     BIOPSY  05/13/2022   Procedure: BIOPSY;  Surgeon: Wilhelmenia Aloha Raddle., MD;  Location: [facility];  Service: Gastroenterology;;   BREAST BIOPSY Left 02/16/2016   BREAST EXCISIONAL BIOPSY Right 2008   CARDIAC CATHETERIZATION  04/19/2013   CERVICAL SPINE SURGERY  04/19/2002   C 5-6   COLONOSCOPY     colonscopy     DIAGNOSTIC LAPAROSCOPY  04/19/1974   fibroid removed   ELBOW ARTHROSCOPY  2010, 2012   ESOPHAGOGASTRODUODENOSCOPY N/A 12/22/2023    Procedure: EGD (ESOPHAGOGASTRODUODENOSCOPY);  Surgeon: Wilhelmenia Aloha Raddle., MD;  Location: [facility];  Service: Gastroenterology;  Laterality: N/A;   ESOPHAGOGASTRODUODENOSCOPY (EGD) WITH PROPOFOL  N/A 05/13/2022   Procedure: ESOPHAGOGASTRODUODENOSCOPY (EGD) WITH PROPOFOL ;  Surgeon: Wilhelmenia Aloha Raddle., MD;  Location: [facility];  Service: Gastroenterology;  Laterality: N/A;   ESOPHAGOGASTRODUODENOSCOPY (EGD) WITH PROPOFOL  N/A 07/15/2022   Procedure: ESOPHAGOGASTRODUODENOSCOPY (EGD) WITH PROPOFOL ;  Surgeon: Wilhelmenia Aloha Raddle., MD;  Location: [facility];  Service: Gastroenterology;  Laterality: N/A;   EUS N/A 07/15/2022   Procedure: UPPER ENDOSCOPIC ULTRASOUND (EUS) RADIAL;  Surgeon: Wilhelmenia Aloha Raddle., MD;  Location: [facility];  Service: Gastroenterology;  Laterality: N/A;   EUS N/A 12/22/2023   Procedure: ULTRASOUND, UPPER GI TRACT, ENDOSCOPIC;  Surgeon: Wilhelmenia Aloha Raddle., MD;  Location: [facility];  Service: Gastroenterology;  Laterality: N/A;   EYE SURGERY Bilateral    cataract   FINE NEEDLE ASPIRATION  N/A 05/13/2022   Procedure: FINE NEEDLE ASPIRATION (FNA) LINEAR;  Surgeon: Wilhelmenia Aloha Raddle., MD;  Location: [facility];  Service: Gastroenterology;  Laterality: N/A;   FINE NEEDLE ASPIRATION  12/22/2023   Procedure: FINE NEEDLE ASPIRATION;  Surgeon: Wilhelmenia Aloha Raddle., MD;  Location: [facility];  Service: Gastroenterology;;   FOOT SURGERY Left 04/20/2011   GASTROCNEMIUS RECESSION Right 10/09/2019   Procedure: RIGHT GASTROCNEMIUS RECESSION;  Surgeon: Harden Jerona GAILS, MD;  Location: [facility];  Service: Orthopedics;  Laterality: Right;   HEMOSTASIS CLIP PLACEMENT  07/15/2022   Procedure: HEMOSTASIS CLIP PLACEMENT;  Surgeon: Wilhelmenia Aloha Raddle., MD;  Location: [facility];  Service: Gastroenterology;;   HYSTEROTOMY  1989   JOINT REPLACEMENT  01/2014   Right knee   KNEE ARTHROSCOPY  01/17/2013   KNEE ARTHROSCOPY W/ MENISCECTOMY  Right 01/31/2013   [facility]   LATERAL EPICONDYLE RELEASE  03/01/2011   Procedure: TENNIS ELBOW RELEASE;  Surgeon: Lamar DELENA Millman, MD;  Location: [facility];  Service: Orthopedics;  Laterality: Left;  TENOTOMY ELBOW LATERAL EPICONDYLITIS TENNIS ELBOW   lateral release right elbow  04/19/2008   left foot surgery  04/20/2011   mortons neuroma   LEFT HEART CATHETERIZATION WITH CORONARY ANGIOGRAM N/A 07/23/2013   Procedure: LEFT HEART CATHETERIZATION WITH CORONARY ANGIOGRAM;  Surgeon: Victory LELON Claudene DOUGLAS, MD;  Location[facility];  Service: Cardiovascular;  Laterality: N/A;   PAROTID GLAND TUMOR EXCISION  04/19/1974   benign   PAROTID GLAND TUMOR EXCISION  04/19/1974   rt   SHOULDER ARTHROSCOPY DISTAL CLAVICLE EXCISION AND OPEN ROTATOR CUFF REPAIR  04/19/2004   SHOULDER ARTHROSCOPY W/ ROTATOR CUFF REPAIR  04/19/2004   SUBMUCOSAL LIFTING INJECTION  07/15/2022   Procedure: SUBMUCOSAL LIFTING INJECTION;  Surgeon: Wilhelmenia Aloha Raddle., MD;  Location: [facility];  Service: Gastroenterology;;   TOTAL KNEE ARTHROPLASTY Right 02/18/2014   Procedure: TOTAL KNEE ARTHROPLASTY;  Surgeon: Lamar DELENA Millman, MD;  Location: [facility];  Service: Orthopedics;  Laterality: Right;   UPPER ESOPHAGEAL ENDOSCOPIC ULTRASOUND (EUS) N/A 05/13/2022   Procedure: UPPER ESOPHAGEAL ENDOSCOPIC ULTRASOUND (EUS);  Surgeon: Wilhelmenia Aloha Raddle., MD;  Location: [facility];  Service: Gastroenterology;  Laterality: N/A;   WEIL OSTEOTOMY Right 10/09/2019   Procedure: WEIL OSTEOTOMY RIGHT 2ND METATARSAL;  Surgeon: Harden Jerona GAILS, MD;  Location: [facility];  Service: Orthopedics;  Laterality: Right;    Social History   Tobacco Use   Smoking status: Never   Smokeless tobacco: Never   Tobacco comments:    Both parents smoked and died of lung cancer  Vaping Use   Vaping status: Never Used  Substance Use Topics   Alcohol use: Yes    Comment: Occasionally on weekends   Drug use: Never     Family History  Problem Relation Age of Onset   Diabetes Mother    Heart disease Mother    Hypertension Mother    Kidney disease Mother    Lung cancer Mother    Lung cancer Father        + tobacco   Alcohol abuse Father    Kidney disease Sister    Colon polyps Sister    Diabetes Sister    Hypertension Sister    Hyperlipidemia Sister    Kidney failure Sister    Depression Sister    Breast cancer Sister 66   Early death Sister    Pancreatic cancer Maternal Aunt    Leukemia Paternal Uncle        ?  CLL   Heart disease Maternal Grandfather        heart attack   Heart disease Paternal Grandfather        heart attack   Colon cancer Nephew    Pancreatic cancer Cousin        d. 55   Esophageal cancer Neg Hx    Inflammatory bowel disease Neg Hx    Liver disease Neg Hx    Rectal cancer Neg Hx    Stomach cancer Neg Hx    BRCA 1/2 Neg Hx     Allergies  Allergen Reactions   Penicillins Swelling and Rash    Facial swelling    Medication list has been reviewed and updated.  Current Outpatient Medications on File Prior to Visit  Medication Sig Dispense Refill   amitriptyline  (ELAVIL ) 50 MG tablet Take 1 tablet (50 mg total) by mouth at bedtime. (Patient taking differently: Take 50 mg by mouth at bedtime. 2 tablets) 90 tablet 1   carvedilol  (COREG ) 3.125 MG tablet Take 1 tablet (3.125 mg total) by mouth 2 (two) times daily with a meal. 180 tablet 1   esomeprazole  (NEXIUM ) 40 MG capsule Take 1 capsule (40 mg total) by mouth daily. Twice daily for 2 months then once daily. 60 capsule 6   lisinopril  (ZESTRIL ) 40 MG tablet Take 1 tablet (40 mg total) by mouth daily. 90 tablet 3   metFORMIN  (GLUCOPHAGE ) 500 MG tablet Take 1 tablet (500 mg total) by mouth daily. 90 tablet 3   polycarbophil (FIBERCON) 625 MG tablet Take 625 mg by mouth daily.     rivaroxaban  (XARELTO ) 10 MG TABS tablet Take 1 tablet (10 mg total) by mouth daily with supper. 30 tablet 12   rosuvastatin  (CRESTOR ) 10  MG tablet Take 1 tablet (10 mg total) by mouth at bedtime. 90 tablet 3   sertraline  (ZOLOFT ) 100 MG tablet Take 2 tablets (200 mg total) by mouth daily.     traZODone  (DESYREL ) 50 MG tablet Take 1-2 tablets (50-100 mg total) by mouth at bedtime. 180 tablet 1   alendronate  (FOSAMAX ) 70 MG tablet Take 1 tablet (70 mg total) by mouth every 7 (seven) days. Take with a full glass of water on an empty stomach. (Patient not taking: Reported on 02/22/2024) 12 tablet 3   No current facility-administered medications on file prior to visit.    Review of Systems:  As per HPI- otherwise negative.   Physical Examination: Vitals:   02/22/24 1508  BP: 120/64  Pulse: 73  Temp: 98 F (36.7 C)  SpO2: 95%   Vitals:   02/22/24 1508  Weight: 165 lb 3.2 oz (74.9 kg)  Height: 5' 5 (1.651 m)   Body mass index is 27.49 kg/m. Ideal Body Weight: Weight in (lb) to have BMI = 25: 149.9  GEN: no acute distress.  Looks well, minimally overweight HEENT: Atraumatic, Normocephalic.  Ears and Nose: No external deformity. CV: RRR, No M/G/R. No JVD. No thrill. No extra heart sounds. PULM: CTA B, no wheezes, crackles, rhonchi. No retractions. No resp. distress. No accessory muscle use. EXTR: No c/c/e PSYCH: Normally interactive. Conversant.  She has a significant skin tear on her right shin as pictured below-proximately 4 cm in length Verbal consent obtained.  I cleaned wound with peroxide and then dressed it using Vaseline gauze and Coban   Assessment and Plan: Wound of right lower extremity, subsequent encounter  Hereditary and idiopathic peripheral neuropathy  Controlled type 2 diabetes mellitus without complication, without long-term  current use of insulin (HCC) - Plan: Hemoglobin A1c, Microalbumin / creatinine urine ratio  Assessment & Plan Skin tear, right lower leg A 14-day-old skin tear on the right lower leg is raw and stings but is not infected. Suturing is not feasible due to the condition of  the skin. The dead skin serves as a natural bandage. - Dress with Vaseline gauze to prevent sticking. - Keep covered until scab forms. - Wash gently with soap and water. - Trim dead skin with nail scissors if peeling, or return for assistance. - Monitor for infection signs and report increased pain or infection.  Type 2 diabetes mellitus Previous blood glucose levels were elevated. - Order A1c test for glycemic control assessment. - Order urine protein test.  Speech and word-finding difficulty Reports difficulty with speech and word-finding, especially in conversations with multiple people. No memory or hearing issues reported. - Refer to neuropsychology for cognitive and speech evaluation.  General Health Maintenance Up to date on flu, COVID, and RSV vaccinations.  She is interested in changing over to Prolia, will work on this  Signed Harlene Schroeder, MD  Addendum 11/6, received labs as below.  Message to patient  Results for orders placed or performed in visit on 02/22/24  Hemoglobin A1c   Collection Time: 02/22/24  3:41 PM  Result Value Ref Range   Hgb A1c MFr Bld 7.9 (H) 4.6 - 6.5 %  Microalbumin / creatinine urine ratio   Collection Time: 02/22/24  3:41 PM  Result Value Ref Range   Microalb, Ur 4.9 (H) 0.0 - 1.9 mg/dL   Creatinine,U 799.6 mg/dL   Microalb Creat Ratio 24.7 0.0 - 30.0 mg/g    "

## 2024-02-20 ENCOUNTER — Ambulatory Visit: Payer: Self-pay | Admitting: Genetic Counselor

## 2024-02-20 DIAGNOSIS — Z1379 Encounter for other screening for genetic and chromosomal anomalies: Secondary | ICD-10-CM

## 2024-02-20 NOTE — Progress Notes (Signed)
 HPI:  Ms. Neitzke was previously seen in the  Cancer Genetics clinic due to a personal and family history of cancer and concerns regarding a hereditary predisposition to cancer. Please refer to our prior cancer genetics clinic note for more information regarding our discussion, assessment and recommendations, at the time. Ms. Greenberger recent genetic test results were disclosed to her, as were recommendations warranted by these results. These results and recommendations are discussed in more detail below.  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 Report date is 02/16/2024.  The CancerNext-Expanded gene panel offered by [facility] and includes sequencing, rearrangement, and RNA analysis for the following 77 genes: AIP, ALK, APC, ATM, BAP1, BARD1, BMPR1A, BRCA1, BRCA2, BRIP1, CDC73, CDH1, CDK4, CDKN1B, CDKN2A, CEBPA, CHEK2, CTNNA1, DDX41, DICER1, ETV6, FH, FLCN, GATA2, LZTR1, MAX, MBD4, MEN1, MET, MLH1, MSH2, MSH3, MSH6, MUTYH, NF1, NF2, NTHL1, PALB2, PHOX2B, PMS2, POT1, PRKAR1A, PTCH1, PTEN, RAD51C, RAD51D, RB1, RET, RPS20, RUNX1, SDHA, SDHAF2, SDHB, SDHC, SDHD, SMAD4, SMARCA4, SMARCB1, SMARCE1, STK11, SUFU, TMEM127, TP53, TSC1, TSC2, VHL, and WT1 (sequencing and deletion/duplication); AXIN2, CTNNA1, DDX41, EGFR, HOXB13, KIT, MBD4, MITF, MSH3, PDGFRA, POLD1 and POLE (sequencing only); EPCAM and GREM1 (deletion/duplication only). RNA data is routinely analyzed for use in variant interpretation for all genes.      FAMILY HISTORY:  We obtained a detailed, 4-generation family history.  Significant diagnoses are listed below: Family History  Problem Relation Age of Onset   Diabetes Mother    Heart disease Mother    Hypertension Mother    Kidney disease Mother    Lung cancer Mother    Lung cancer  Father        + tobacco   Alcohol abuse Father    Kidney disease Sister    Colon polyps Sister    Diabetes Sister    Hypertension Sister    Hyperlipidemia Sister    Kidney failure Sister    Depression Sister    Breast cancer Sister 59   Early death Sister    Pancreatic cancer Maternal Aunt    Leukemia Paternal Uncle        ?CLL   Heart disease Maternal Grandfather        heart attack   Heart disease Paternal Grandfather        heart attack   Colon cancer Nephew    Pancreatic cancer Cousin        d. 34   Esophageal cancer Neg Hx    Inflammatory bowel disease Neg Hx    Liver disease Neg Hx    Rectal cancer Neg Hx    Stomach cancer Neg Hx    BRCA 1/2 Neg Hx        The patient does not have children.  She has two sisters, one had breast cancer at 62.  The other sister has a son who died of colon cancer at 83.  Both parents died of lung cancer.   The patient's mother had one sister and three brothers.  The sister had pancreatic cancer and one brother had a daughter who had pancreatic cancer.   The patient's father had three brothers, two had leukemia,.     Ms. Beevers is unaware of previous family history of genetic testing for hereditary cancer risks. There is no reported Ashkenazi Jewish ancestry. There is no known consanguinity.  GENETIC TEST RESULTS: Genetic testing reported out on February 16, 2024 through the CancerNext-expanded+RNAinsight cancer panel found no pathogenic mutations. The CancerNext-Expanded gene panel offered by [facility] and includes sequencing, rearrangement, and RNA analysis for the following 77 genes: AIP, ALK, APC, ATM, BAP1, BARD1, BMPR1A, BRCA1, BRCA2, BRIP1, CDC73, CDH1, CDK4, CDKN1B, CDKN2A, CEBPA, CHEK2, CTNNA1, DDX41, DICER1, ETV6, FH, FLCN, GATA2, LZTR1, MAX, MBD4, MEN1, MET, MLH1, MSH2, MSH3, MSH6, MUTYH, NF1, NF2, NTHL1, PALB2, PHOX2B, PMS2, POT1, PRKAR1A, PTCH1, PTEN, RAD51C, RAD51D, RB1, RET, RPS20, RUNX1, SDHA, SDHAF2, SDHB, SDHC, SDHD,  SMAD4, SMARCA4, SMARCB1, SMARCE1, STK11, SUFU, TMEM127, TP53, TSC1, TSC2, VHL, and WT1 (sequencing and deletion/duplication); AXIN2, CTNNA1, DDX41, EGFR, HOXB13, KIT, MBD4, MITF, MSH3, PDGFRA, POLD1 and POLE (sequencing only); EPCAM and GREM1 (deletion/duplication only). RNA data is routinely analyzed for use in variant interpretation for all genes. The test report has been scanned into EPIC and is located under the Molecular Pathology section of the Results Review tab.  A portion of the result report is included below for reference.     We discussed with Ms. Rahal that because current genetic testing is not perfect, it is possible there may be a gene mutation in one of these genes that current testing cannot detect, but that chance is small.  We also discussed, that there could be another gene that has not yet been discovered, or that we have not yet tested, that is responsible for the cancer diagnoses in the family. It is also possible there is a hereditary cause for the cancer in the family that Ms. Wisser did not inherit and therefore was not identified in her testing.  Therefore, it is important to remain in touch with cancer genetics in the future so that we can continue to offer Ms. Khachatryan the most up to date genetic testing.   ADDITIONAL GENETIC TESTING: We discussed with Ms. Southgate that her genetic testing was fairly extensive.  If there are genes identified to increase cancer risk that can be analyzed in the future, we would be happy to discuss and coordinate this testing at that time.    CANCER SCREENING RECOMMENDATIONS: Ms. Laffey test result is considered negative (normal).  This means that we have not identified a hereditary cause for her personal and family history of cancer at this time. Most cancers happen by chance and this negative test suggests that her personal and family history of cancer may fall into this category.    Possible reasons for Ms. Bittinger's negative genetic test  include:  1. There may be a gene mutation in one of these genes that current testing methods cannot detect but that chance is small.  2. There could be another gene that has not yet been discovered, or that we have not yet tested, that is responsible for the cancer diagnoses in the family.  3.  There may be no hereditary risk for cancer in the family. The cancers in Ms. Shain and/or her family may be sporadic/familial or due to other genetic and environmental factors. 4. It is also possible there is a hereditary cause for the cancer in the family that Ms. Abrahamsen did not inherit.  Therefore, it is recommended she continue to follow the cancer management and screening guidelines provided by her oncology and primary healthcare provider. An individual's cancer risk and medical management are not determined by genetic test results alone. Overall cancer risk assessment incorporates additional factors, including personal medical history, family history, and any available genetic information that may result in  a personalized plan for cancer prevention and surveillance  RECOMMENDATIONS FOR FAMILY MEMBERS:   Since she did not inherit a identifiable mutation in a cancer predisposition gene included on this panel, her children could not have inherited a known mutation from her in one of these genes. Individuals in this family might be at some increased risk of developing cancer, over the general population risk, simply due to the family history of cancer.  We recommended women in this family have a yearly mammogram beginning at age 38, or 27 years younger than the earliest onset of cancer, an annual clinical breast exam, and perform monthly breast self-exams. Women in this family should also have a gynecological exam as recommended by their primary provider. All family members should be referred for colonoscopy starting at age 52, or 40 years younger than the earliest onset of cancer.  FOLLOW-UP: Lastly, we  discussed with Ms. Pe that cancer genetics is a rapidly advancing field and it is possible that new genetic tests will be appropriate for her and/or her family members in the future. We encouraged her to remain in contact with cancer genetics on an annual basis so we can update her personal and family histories and let her know of advances in cancer genetics that may benefit this family.   Our contact number was provided. Ms. Albany questions were answered to her satisfaction, and she knows she is welcome to call us  at anytime with additional questions or concerns.   Darice Monte, MS, [facility] Licensed, Certified Genetic Counselor [email or web address]

## 2024-02-22 ENCOUNTER — Encounter: Payer: Self-pay | Admitting: Family Medicine

## 2024-02-22 ENCOUNTER — Ambulatory Visit (INDEPENDENT_AMBULATORY_CARE_PROVIDER_SITE_OTHER): Admitting: Family Medicine

## 2024-02-22 VITALS — BP 120/64 | HR 73 | Temp 98.0°F | Ht 65.0 in | Wt 165.2 lb

## 2024-02-22 DIAGNOSIS — G609 Hereditary and idiopathic neuropathy, unspecified: Secondary | ICD-10-CM

## 2024-02-22 DIAGNOSIS — S81801D Unspecified open wound, right lower leg, subsequent encounter: Secondary | ICD-10-CM | POA: Diagnosis not present

## 2024-02-22 DIAGNOSIS — R4189 Other symptoms and signs involving cognitive functions and awareness: Secondary | ICD-10-CM

## 2024-02-22 DIAGNOSIS — E119 Type 2 diabetes mellitus without complications: Secondary | ICD-10-CM

## 2024-02-22 NOTE — Patient Instructions (Signed)
 Good to see you today- I will be in touch with your labs asap Keep your wound covered until it scabs over well- it you think it is infected please alert me

## 2024-02-23 ENCOUNTER — Encounter: Payer: Self-pay | Admitting: Family Medicine

## 2024-02-23 LAB — MICROALBUMIN / CREATININE URINE RATIO
Creatinine,U: 200.3 mg/dL
Microalb Creat Ratio: 24.7 mg/g (ref 0.0–30.0)
Microalb, Ur: 4.9 mg/dL — ABNORMAL HIGH (ref 0.0–1.9)

## 2024-02-23 LAB — HEMOGLOBIN A1C: Hgb A1c MFr Bld: 7.9 % — ABNORMAL HIGH (ref 4.6–6.5)

## 2024-03-12 ENCOUNTER — Encounter: Payer: Self-pay | Admitting: Family Medicine

## 2024-03-13 NOTE — Progress Notes (Unsigned)
 [facility] [address], [address] [place], [place] [address] [phone]  Date:  03/14/2024   Name:  Monica Kane   DOB:  1945/09/18   MRN:  [id number]  PCP:  Watt Harlene JAYSON, MD    Chief Complaint: Wound Check (Recheck wound on R leg)   History of Present Illness:  Monica Kane is a 78 y.o. very pleasant female patient who presents with the following:  Patient seen today to discuss wound on her right lower leg.  I saw her for this issue on November 5-see my note from that day. Would occurred approx 02/12/24 History of hypertension, diabetes, hyperlipidemia, sleep apnea treated with oral appliance, vitamin D  deficiency, neuroendocrine tumor of the duodenum diagnosed in 2024 In February 2023 she also tested positive for pulmonary embolism in the setting of acute illness, suspected COVID though she tested negative.   Most recent visit with hematology was in [place] is currently on maintenance Xarelto , 10 mg daily.  Plan for long-term anticoagulation  Patient reached out to us  2 days ago with concern about how her right lower leg wound was seen.  She decided to come in so we can take a look at it  Discussed the use of AI scribe software for clinical note transcription with the patient, who gave verbal consent to proceed.  History of Present Illness Monica Kane is a 78 year old female who presents with a healing wound on her foot.  She has a healing wound on her foot that has been consistently painful, though the pain has been decreasing over time. The wound is sometimes itchy, and she props her foot up to alleviate discomfort. No fever or chills are present.  The wound was initially large, with a significant section of skin torn off. She manages the wound by washing it with soap and water in the shower and keeping it covered when out. At home, she allows it to air out to promote healing and has started sleeping  with it uncovered to let it dry out.  She applies antibiotic ointment once or twice a day, which makes the wound soft. There is some dead skin present, which will slough off naturally or can be trimmed when dry.  The initial injury occurred more than ten days prior to the last visit. The injury was accidental, involving her brother-in-law.    Patient Active Problem List   Diagnosis Date Noted   Genetic testing 02/17/2024   Family history of breast cancer    High serum carbohydrate antigen 19-9 (CA19-9) 12/22/2023   Erosive gastritis 12/22/2023   Benign neuroendocrine tumor of small intestine (HCC) 12/22/2023   Change in stool caliber 12/17/2022   Constipation 12/17/2022   Distended abdomen 12/17/2022   Change in bowel habits 12/17/2022   Bloating 12/17/2022   Neuroendocrine tumor (HCC) 12/17/2022   Pancreatic cyst 03/31/2022   Elevated CA 19-9 level 03/31/2022   Family history of malignant neoplasm of pancreas 03/31/2022   Hx of adenomatous colonic polyps 03/31/2022   Chronic anticoagulation 03/31/2022   IPMN (intraductal papillary mucinous neoplasm) 02/10/2022   Pulmonary embolism (HCC) 07/08/2021   Acquired claw toe, right    Achilles tendon contracture, right    OSA (obstructive sleep apnea) 07/26/2019   Vertigo 10/03/2018   Visual blurriness 10/03/2018   Controlled type 2 diabetes mellitus without complication, without long-term current use of insulin (HCC) 06/05/2015   Primary localized osteoarthritis of right knee  Chest pain, midsternal 07/23/2013   Osteopenia 03/23/2013   Kidney stones, calcium  oxalate 03/23/2013   Hypertension 03/31/2011   Hyperlipidemia 03/31/2011   Vitamin D  deficiency 03/31/2011   Hallux rigidus 03/24/2011   Metatarsalgia of left foot 03/24/2011   Lateral epicondylitis of left elbow 02/24/2011   Migraine 02/24/2011   Arthritis 02/24/2011    Past Medical History:  Diagnosis Date   Allergy    Rash, some swelling on face   Anxiety     takes Ativan nightly   Depression    takes Zoloft  daily   Family history of breast cancer    Family history of pancreatic cancer    GERD (gastroesophageal reflux disease) March 2024   History of colon polyps    History of kidney stones    History of staph infection 11/2013   Hyperlipidemia    takes Crestor  nightly    Hypertension    takes Lisinopril  and Coreg  daily   Joint pain    Joint swelling    Neuropathy    takes Elavil  nightly   Pneumonia    Pre-diabetes    Primary localized osteoarthritis of right knee    Pulmonary embolism (HCC) 07/08/2021   Sleep apnea    uses oral appliance    Past Surgical History:  Procedure Laterality Date   ABDOMINAL HYSTERECTOMY  04/20/1987   APPENDECTOMY     BIOPSY  05/13/2022   Procedure: BIOPSY;  Surgeon: Wilhelmenia Aloha Raddle., MD;  Location: [facility];  Service: Gastroenterology;;   BREAST BIOPSY Left 02/16/2016   BREAST EXCISIONAL BIOPSY Right 2008   CARDIAC CATHETERIZATION  04/19/2013   CERVICAL SPINE SURGERY  04/19/2002   C 5-6   COLONOSCOPY     colonscopy     DIAGNOSTIC LAPAROSCOPY  04/19/1974   fibroid removed   ELBOW ARTHROSCOPY  2010, 2012   ESOPHAGOGASTRODUODENOSCOPY N/A 12/22/2023   Procedure: EGD (ESOPHAGOGASTRODUODENOSCOPY);  Surgeon: Wilhelmenia Aloha Raddle., MD;  Location: [facility];  Service: Gastroenterology;  Laterality: N/A;   ESOPHAGOGASTRODUODENOSCOPY (EGD) WITH PROPOFOL  N/A 05/13/2022   Procedure: ESOPHAGOGASTRODUODENOSCOPY (EGD) WITH PROPOFOL ;  Surgeon: Wilhelmenia Aloha Raddle., MD;  Location: [facility];  Service: Gastroenterology;  Laterality: N/A;   ESOPHAGOGASTRODUODENOSCOPY (EGD) WITH PROPOFOL  N/A 07/15/2022   Procedure: ESOPHAGOGASTRODUODENOSCOPY (EGD) WITH PROPOFOL ;  Surgeon: Wilhelmenia Aloha Raddle., MD;  Location: [facility];  Service: Gastroenterology;  Laterality: N/A;   EUS N/A 07/15/2022   Procedure: UPPER ENDOSCOPIC ULTRASOUND (EUS) RADIAL;  Surgeon: Wilhelmenia Aloha Raddle., MD;  Location: [facility];  Service: Gastroenterology;  Laterality: N/A;   EUS N/A 12/22/2023   Procedure: ULTRASOUND, UPPER GI TRACT, ENDOSCOPIC;  Surgeon: Wilhelmenia Aloha Raddle., MD;  Location: [facility];  Service: Gastroenterology;  Laterality: N/A;   EYE SURGERY Bilateral    cataract   FINE NEEDLE ASPIRATION N/A 05/13/2022   Procedure: FINE NEEDLE ASPIRATION (FNA) LINEAR;  Surgeon: Wilhelmenia Aloha Raddle., MD;  Location: [facility];  Service: Gastroenterology;  Laterality: N/A;   FINE NEEDLE ASPIRATION  12/22/2023   Procedure: FINE NEEDLE ASPIRATION;  Surgeon: Wilhelmenia Aloha Raddle., MD;  Location: [facility];  Service: Gastroenterology;;   FOOT SURGERY Left 04/20/2011   GASTROCNEMIUS RECESSION Right 10/09/2019   Procedure: RIGHT GASTROCNEMIUS RECESSION;  Surgeon: Harden Jerona GAILS, MD;  Location: [facility];  Service: Orthopedics;  Laterality: Right;   HEMOSTASIS CLIP PLACEMENT  07/15/2022   Procedure: HEMOSTASIS CLIP PLACEMENT;  Surgeon: Wilhelmenia Aloha Raddle., MD;  Location: [facility];  Service: Gastroenterology;;   HYSTEROTOMY  1989   JOINT REPLACEMENT  01/2014  Right knee   KNEE ARTHROSCOPY  01/17/2013   KNEE ARTHROSCOPY W/ MENISCECTOMY Right 01/31/2013   [facility]   LATERAL EPICONDYLE RELEASE  03/01/2011   Procedure: TENNIS ELBOW RELEASE;  Surgeon: Lamar DELENA Millman, MD;  Location: [facility];  Service: Orthopedics;  Laterality: Left;  TENOTOMY ELBOW LATERAL EPICONDYLITIS TENNIS ELBOW   lateral release right elbow  04/19/2008   left foot surgery  04/20/2011   [facility] neuroma   LEFT HEART CATHETERIZATION WITH CORONARY ANGIOGRAM N/A 07/23/2013   Procedure: LEFT HEART CATHETERIZATION WITH CORONARY ANGIOGRAM;  Surgeon: Victory LELON Claudene DOUGLAS, MD;  Location: [facility];  Service: Cardiovascular;  Laterality: N/A;   PAROTID GLAND TUMOR EXCISION  04/19/1974   benign   PAROTID GLAND TUMOR EXCISION  04/19/1974   rt   SHOULDER ARTHROSCOPY DISTAL CLAVICLE EXCISION AND OPEN  ROTATOR CUFF REPAIR  04/19/2004   SHOULDER ARTHROSCOPY W/ ROTATOR CUFF REPAIR  04/19/2004   SUBMUCOSAL LIFTING INJECTION  07/15/2022   Procedure: SUBMUCOSAL LIFTING INJECTION;  Surgeon: Wilhelmenia Aloha Raddle., MD;  Location: [facility];  Service: Gastroenterology;;   TOTAL KNEE ARTHROPLASTY Right 02/18/2014   Procedure: TOTAL KNEE ARTHROPLASTY;  Surgeon: Lamar DELENA Millman, MD;  Location: [facility];  Service: Orthopedics;  Laterality: Right;   UPPER ESOPHAGEAL ENDOSCOPIC ULTRASOUND (EUS) N/A 05/13/2022   Procedure: UPPER ESOPHAGEAL ENDOSCOPIC ULTRASOUND (EUS);  Surgeon: Wilhelmenia Aloha Raddle., MD;  Location: [facility];  Service: Gastroenterology;  Laterality: N/A;   WEIL OSTEOTOMY Right 10/09/2019   Procedure: WEIL OSTEOTOMY RIGHT 2ND METATARSAL;  Surgeon: Harden Jerona GAILS, MD;  Location: [facility];  Service: Orthopedics;  Laterality: Right;    Social History   Tobacco Use   Smoking status: Never   Smokeless tobacco: Never   Tobacco comments:    Both parents smoked and died of lung cancer  Vaping Use   Vaping status: Never Used  Substance Use Topics   Alcohol use: Yes    Comment: Occasionally on weekends   Drug use: Never    Family History  Problem Relation Age of Onset   Diabetes Mother    Heart disease Mother    Hypertension Mother    Kidney disease Mother    Lung cancer Mother    Lung cancer Father        + tobacco   Alcohol abuse Father    Kidney disease Sister    Colon polyps Sister    Diabetes Sister    Hypertension Sister    Hyperlipidemia Sister    Kidney failure Sister    Depression Sister    Breast cancer Sister 39   Early death Sister    Pancreatic cancer Maternal Aunt    Leukemia Paternal Uncle        ?CLL   Heart disease Maternal Grandfather        heart attack   Heart disease Paternal Grandfather        heart attack   Colon cancer Nephew    Pancreatic cancer Cousin        d. 55   Esophageal cancer Neg Hx    Inflammatory bowel  disease Neg Hx    Liver disease Neg Hx    Rectal cancer Neg Hx    Stomach cancer Neg Hx    BRCA 1/2 Neg Hx     Allergies  Allergen Reactions   Penicillins Swelling and Rash    Facial swelling    Medication list has been reviewed and updated.  Current Outpatient Medications on  File Prior to Visit  Medication Sig Dispense Refill   amitriptyline  (ELAVIL ) 50 MG tablet Take 1 tablet (50 mg total) by mouth at bedtime. (Patient taking differently: Take 50 mg by mouth at bedtime. 2 tablets) 90 tablet 1   buPROPion (WELLBUTRIN XL) 150 MG 24 hr tablet Take 150 mg by mouth daily.     carvedilol  (COREG ) 3.125 MG tablet Take 1 tablet (3.125 mg total) by mouth 2 (two) times daily with a meal. 180 tablet 1   esomeprazole  (NEXIUM ) 40 MG capsule Take 1 capsule (40 mg total) by mouth daily. Twice daily for 2 months then once daily. 60 capsule 6   lisinopril  (ZESTRIL ) 40 MG tablet Take 1 tablet (40 mg total) by mouth daily. 90 tablet 3   metFORMIN  (GLUCOPHAGE ) 500 MG tablet Take 1 tablet (500 mg total) by mouth daily. 90 tablet 3   polycarbophil (FIBERCON) 625 MG tablet Take 625 mg by mouth daily.     rivaroxaban  (XARELTO ) 10 MG TABS tablet Take 1 tablet (10 mg total) by mouth daily with supper. 30 tablet 12   rosuvastatin  (CRESTOR ) 10 MG tablet Take 1 tablet (10 mg total) by mouth at bedtime. 90 tablet 3   sertraline  (ZOLOFT ) 100 MG tablet Take 2 tablets (200 mg total) by mouth daily.     traZODone  (DESYREL ) 50 MG tablet Take 1-2 tablets (50-100 mg total) by mouth at bedtime. 180 tablet 1   alendronate  (FOSAMAX ) 70 MG tablet Take 1 tablet (70 mg total) by mouth every 7 (seven) days. Take with a full glass of water on an empty stomach. (Patient not taking: Reported on 03/14/2024) 12 tablet 3   No current facility-administered medications on file prior to visit.    Review of Systems:  As per HPI- otherwise negative.   Physical Examination: Vitals:   03/14/24 1521  BP: 118/80  Pulse: 73  Temp:  98.1 F (36.7 C)  SpO2: 98%   Vitals:   03/14/24 1521  Weight: 164 lb 12.8 oz (74.8 kg)  Height: 5' 5 (1.651 m)   Body mass index is 27.42 kg/m. Ideal Body Weight: Weight in (lb) to have BMI = 25: 149.9  GEN: No acute distress; alert,appropriate. PULM: Breathing comfortably in no respiratory distress PSYCH: Normally interactive.  Wound on right lower leg is neatly bandaged with clean bandaging material.  Wound appears to be healing appropriately by secondary intention, there is soft eschar forming.  It does not appear infected, there is no foul-smelling discharge    Assessment and Plan: Wound of right lower extremity, subsequent encounter  Assessment & Plan Healing skin wound of foot Wound healing well with decreased size and soft scab. Healing from outside in due to a large defect in skin that was not able to be sutured - Allow wound to be open to air sometimes went home, this should help wound to dry out and form a scab. - Keep wound covered when outside. - Wash wound gently with soap and water daily, then pat dry and bandage - Apply antibiotic ointment once or twice daily if desired. - Trim dead skin with nail scissors as needed. - Send photo of wound in a week if concerned.  Signed Harlene Schroeder, MD

## 2024-03-14 ENCOUNTER — Ambulatory Visit (INDEPENDENT_AMBULATORY_CARE_PROVIDER_SITE_OTHER): Admitting: Family Medicine

## 2024-03-14 VITALS — BP 118/80 | HR 73 | Temp 98.1°F | Ht 65.0 in | Wt 164.8 lb

## 2024-03-14 DIAGNOSIS — S81801D Unspecified open wound, right lower leg, subsequent encounter: Secondary | ICD-10-CM | POA: Diagnosis not present

## 2024-03-20 ENCOUNTER — Encounter: Payer: Self-pay | Admitting: Psychology

## 2024-03-20 ENCOUNTER — Encounter: Payer: Self-pay | Admitting: Family Medicine

## 2024-04-04 ENCOUNTER — Other Ambulatory Visit: Payer: Self-pay | Admitting: Surgery

## 2024-04-04 DIAGNOSIS — D490 Neoplasm of unspecified behavior of digestive system: Secondary | ICD-10-CM

## 2024-04-09 ENCOUNTER — Inpatient Hospital Stay: Admission: RE | Admit: 2024-04-09 | Discharge: 2024-04-09 | Attending: Surgery

## 2024-04-09 DIAGNOSIS — D490 Neoplasm of unspecified behavior of digestive system: Secondary | ICD-10-CM

## 2024-04-09 MED ORDER — GADOPICLENOL 0.5 MMOL/ML IV SOLN
7.0000 mL | Freq: Once | INTRAVENOUS | Status: AC | PRN
  Administered 2024-04-09: 7 mL via INTRAVENOUS

## 2024-04-15 ENCOUNTER — Encounter: Payer: Self-pay | Admitting: Family Medicine

## 2024-04-15 DIAGNOSIS — G8929 Other chronic pain: Secondary | ICD-10-CM

## 2024-04-23 NOTE — Addendum Note (Signed)
 Addended by: WATT RAISIN C on: 04/23/2024 10:26 AM   Modules accepted: Orders

## 2024-05-04 ENCOUNTER — Encounter: Payer: Self-pay | Admitting: Hematology & Oncology

## 2024-06-18 ENCOUNTER — Other Ambulatory Visit

## 2024-06-18 ENCOUNTER — Ambulatory Visit: Admitting: Hematology & Oncology

## 2024-07-12 ENCOUNTER — Encounter: Admitting: Psychology

## 2024-10-30 ENCOUNTER — Ambulatory Visit

## 2024-10-31 ENCOUNTER — Ambulatory Visit
